# Patient Record
Sex: Male | Born: 1935
Health system: Southern US, Community
[De-identification: ages and names within clinical notes are randomized; demographics above are authoritative.]

## PROBLEM LIST (undated history)

## (undated) DIAGNOSIS — I2699 Other pulmonary embolism without acute cor pulmonale: Secondary | ICD-10-CM

## (undated) DIAGNOSIS — M1990 Unspecified osteoarthritis, unspecified site: Secondary | ICD-10-CM

## (undated) DIAGNOSIS — I509 Heart failure, unspecified: Secondary | ICD-10-CM

## (undated) DIAGNOSIS — M109 Gout, unspecified: Secondary | ICD-10-CM

## (undated) DIAGNOSIS — L97909 Non-pressure chronic ulcer of unspecified part of unspecified lower leg with unspecified severity: Secondary | ICD-10-CM

## (undated) DIAGNOSIS — I2510 Atherosclerotic heart disease of native coronary artery without angina pectoris: Secondary | ICD-10-CM

## (undated) DIAGNOSIS — C4491 Basal cell carcinoma of skin, unspecified: Secondary | ICD-10-CM

## (undated) DIAGNOSIS — K219 Gastro-esophageal reflux disease without esophagitis: Secondary | ICD-10-CM

## (undated) DIAGNOSIS — IMO0002 Reserved for concepts with insufficient information to code with codable children: Secondary | ICD-10-CM

## (undated) DIAGNOSIS — J189 Pneumonia, unspecified organism: Secondary | ICD-10-CM

## (undated) DIAGNOSIS — I499 Cardiac arrhythmia, unspecified: Secondary | ICD-10-CM

## (undated) DIAGNOSIS — I83009 Varicose veins of unspecified lower extremity with ulcer of unspecified site: Secondary | ICD-10-CM

## (undated) DIAGNOSIS — I219 Acute myocardial infarction, unspecified: Secondary | ICD-10-CM

## (undated) DIAGNOSIS — E785 Hyperlipidemia, unspecified: Secondary | ICD-10-CM

## (undated) DIAGNOSIS — K279 Peptic ulcer, site unspecified, unspecified as acute or chronic, without hemorrhage or perforation: Secondary | ICD-10-CM

## (undated) DIAGNOSIS — I491 Atrial premature depolarization: Secondary | ICD-10-CM

## (undated) DIAGNOSIS — I10 Essential (primary) hypertension: Secondary | ICD-10-CM

## (undated) DIAGNOSIS — G629 Polyneuropathy, unspecified: Secondary | ICD-10-CM

## (undated) DIAGNOSIS — C8590 Non-Hodgkin lymphoma, unspecified, unspecified site: Secondary | ICD-10-CM

## (undated) DIAGNOSIS — N529 Male erectile dysfunction, unspecified: Secondary | ICD-10-CM

## (undated) DIAGNOSIS — D839 Common variable immunodeficiency, unspecified: Secondary | ICD-10-CM

## (undated) DIAGNOSIS — R7301 Impaired fasting glucose: Secondary | ICD-10-CM

## (undated) HISTORY — DX: Peptic ulcer, site unspecified, unspecified as acute or chronic, without hemorrhage or perforation: K27.9

## (undated) HISTORY — PX: CORONARY ANGIOPLASTY: SHX604

## (undated) HISTORY — DX: Heart failure, unspecified: I50.9

## (undated) HISTORY — DX: Acute myocardial infarction, unspecified: I21.9

## (undated) HISTORY — PX: BACK SURGERY: SHX140

## (undated) HISTORY — DX: Unspecified osteoarthritis, unspecified site: M19.90

## (undated) HISTORY — DX: Gastro-esophageal reflux disease without esophagitis: K21.9

## (undated) HISTORY — PX: CATARACT EXTRACTION W/ INTRAOCULAR LENS  IMPLANT, BILATERAL: SHX1307

## (undated) HISTORY — DX: Non-Hodgkin lymphoma, unspecified, unspecified site: C85.90

## (undated) HISTORY — DX: Common variable immunodeficiency, unspecified: D83.9

## (undated) HISTORY — DX: Impaired fasting glucose: R73.01

## (undated) HISTORY — DX: Non-pressure chronic ulcer of unspecified part of unspecified lower leg with unspecified severity: L97.909

## (undated) HISTORY — DX: Male erectile dysfunction, unspecified: N52.9

## (undated) HISTORY — DX: Hyperlipidemia, unspecified: E78.5

## (undated) HISTORY — PX: ELBOW SURGERY: SHX618

## (undated) HISTORY — DX: Varicose veins of unspecified lower extremity with ulcer of unspecified site: I83.009

## (undated) HISTORY — DX: Cardiac arrhythmia, unspecified: I49.9

## (undated) HISTORY — DX: Atrial premature depolarization: I49.1

## (undated) HISTORY — DX: Other pulmonary embolism without acute cor pulmonale: I26.99

## (undated) HISTORY — DX: Atherosclerotic heart disease of native coronary artery without angina pectoris: I25.10

## (undated) HISTORY — PX: KNEE ARTHROSCOPY: SUR90

## (undated) HISTORY — DX: Gout, unspecified: M10.9

## (undated) HISTORY — PX: LUMBAR DISC SURGERY: SHX700

## (undated) HISTORY — DX: Essential (primary) hypertension: I10

---

## 1970-11-09 HISTORY — PX: TONSILLECTOMY: SUR1361

## 1978-11-09 HISTORY — PX: NASAL SINUS SURGERY: SHX719

## 1984-11-09 DIAGNOSIS — I219 Acute myocardial infarction, unspecified: Secondary | ICD-10-CM

## 1984-11-09 HISTORY — DX: Acute myocardial infarction, unspecified: I21.9

## 1988-11-09 HISTORY — PX: CARDIAC CATHETERIZATION: SHX172

## 1988-11-09 HISTORY — PX: CORONARY ARTERY BYPASS GRAFT: SHX141

## 1998-02-07 ENCOUNTER — Encounter: Admission: RE | Admit: 1998-02-07 | Discharge: 1998-05-08 | Payer: Self-pay | Admitting: Radiation Oncology

## 1998-04-22 ENCOUNTER — Ambulatory Visit (HOSPITAL_COMMUNITY): Admission: RE | Admit: 1998-04-22 | Discharge: 1998-04-22 | Payer: Self-pay | Admitting: *Deleted

## 1999-03-05 ENCOUNTER — Encounter: Payer: Self-pay | Admitting: *Deleted

## 1999-03-05 ENCOUNTER — Inpatient Hospital Stay (HOSPITAL_COMMUNITY): Admission: AD | Admit: 1999-03-05 | Discharge: 1999-03-07 | Payer: Self-pay | Admitting: *Deleted

## 1999-03-06 ENCOUNTER — Encounter: Payer: Self-pay | Admitting: *Deleted

## 2000-03-08 ENCOUNTER — Ambulatory Visit (HOSPITAL_COMMUNITY): Admission: RE | Admit: 2000-03-08 | Discharge: 2000-03-08 | Payer: Self-pay | Admitting: *Deleted

## 2000-03-09 ENCOUNTER — Ambulatory Visit (HOSPITAL_COMMUNITY): Admission: RE | Admit: 2000-03-09 | Discharge: 2000-03-09 | Payer: Self-pay | Admitting: *Deleted

## 2001-12-03 ENCOUNTER — Encounter: Payer: Self-pay | Admitting: Emergency Medicine

## 2001-12-03 ENCOUNTER — Emergency Department (HOSPITAL_COMMUNITY): Admission: EM | Admit: 2001-12-03 | Discharge: 2001-12-03 | Payer: Self-pay | Admitting: Emergency Medicine

## 2005-02-02 ENCOUNTER — Encounter: Admission: RE | Admit: 2005-02-02 | Discharge: 2005-02-02 | Payer: Self-pay | Admitting: Internal Medicine

## 2005-02-03 ENCOUNTER — Encounter: Admission: RE | Admit: 2005-02-03 | Discharge: 2005-02-03 | Payer: Self-pay | Admitting: Internal Medicine

## 2005-02-17 ENCOUNTER — Encounter (INDEPENDENT_AMBULATORY_CARE_PROVIDER_SITE_OTHER): Payer: Self-pay | Admitting: General Surgery

## 2005-02-17 ENCOUNTER — Encounter (INDEPENDENT_AMBULATORY_CARE_PROVIDER_SITE_OTHER): Payer: Self-pay | Admitting: *Deleted

## 2005-02-17 ENCOUNTER — Ambulatory Visit (HOSPITAL_COMMUNITY): Admission: RE | Admit: 2005-02-17 | Discharge: 2005-02-17 | Payer: Self-pay | Admitting: General Surgery

## 2005-02-26 ENCOUNTER — Ambulatory Visit: Payer: Self-pay | Admitting: Hematology & Oncology

## 2005-03-09 ENCOUNTER — Ambulatory Visit: Payer: Self-pay | Admitting: Hematology & Oncology

## 2005-03-09 ENCOUNTER — Ambulatory Visit (HOSPITAL_COMMUNITY): Admission: RE | Admit: 2005-03-09 | Discharge: 2005-03-09 | Payer: Self-pay | Admitting: Hematology & Oncology

## 2005-03-10 ENCOUNTER — Encounter (INDEPENDENT_AMBULATORY_CARE_PROVIDER_SITE_OTHER): Payer: Self-pay | Admitting: *Deleted

## 2005-03-10 ENCOUNTER — Ambulatory Visit (HOSPITAL_COMMUNITY): Admission: RE | Admit: 2005-03-10 | Discharge: 2005-03-10 | Payer: Self-pay | Admitting: Hematology & Oncology

## 2005-03-11 ENCOUNTER — Ambulatory Visit (HOSPITAL_COMMUNITY): Admission: RE | Admit: 2005-03-11 | Discharge: 2005-03-11 | Payer: Self-pay | Admitting: Hematology & Oncology

## 2005-03-24 ENCOUNTER — Ambulatory Visit (HOSPITAL_COMMUNITY): Admission: RE | Admit: 2005-03-24 | Discharge: 2005-03-24 | Payer: Self-pay | Admitting: Hematology & Oncology

## 2005-04-14 ENCOUNTER — Ambulatory Visit: Payer: Self-pay | Admitting: Hematology & Oncology

## 2005-04-29 ENCOUNTER — Ambulatory Visit (HOSPITAL_COMMUNITY): Admission: RE | Admit: 2005-04-29 | Discharge: 2005-04-29 | Payer: Self-pay | Admitting: Hematology & Oncology

## 2005-05-27 ENCOUNTER — Ambulatory Visit (HOSPITAL_COMMUNITY): Admission: RE | Admit: 2005-05-27 | Discharge: 2005-05-27 | Payer: Self-pay | Admitting: Hematology & Oncology

## 2005-06-02 ENCOUNTER — Ambulatory Visit: Payer: Self-pay | Admitting: Hematology & Oncology

## 2005-06-03 ENCOUNTER — Inpatient Hospital Stay (HOSPITAL_COMMUNITY): Admission: RE | Admit: 2005-06-03 | Discharge: 2005-06-06 | Payer: Self-pay | Admitting: Hematology & Oncology

## 2005-06-04 ENCOUNTER — Ambulatory Visit: Payer: Self-pay | Admitting: Hematology & Oncology

## 2005-06-11 ENCOUNTER — Ambulatory Visit (HOSPITAL_COMMUNITY): Admission: RE | Admit: 2005-06-11 | Discharge: 2005-06-11 | Payer: Self-pay | Admitting: Hematology & Oncology

## 2005-06-29 ENCOUNTER — Ambulatory Visit (HOSPITAL_COMMUNITY): Admission: RE | Admit: 2005-06-29 | Discharge: 2005-06-29 | Payer: Self-pay | Admitting: Hematology & Oncology

## 2005-07-02 ENCOUNTER — Ambulatory Visit (HOSPITAL_COMMUNITY): Admission: RE | Admit: 2005-07-02 | Discharge: 2005-07-02 | Payer: Self-pay | Admitting: Hematology & Oncology

## 2005-08-11 ENCOUNTER — Ambulatory Visit: Payer: Self-pay | Admitting: Hematology & Oncology

## 2005-09-09 ENCOUNTER — Ambulatory Visit (HOSPITAL_COMMUNITY): Admission: RE | Admit: 2005-09-09 | Discharge: 2005-09-09 | Payer: Self-pay | Admitting: Hematology & Oncology

## 2005-11-03 ENCOUNTER — Ambulatory Visit: Payer: Self-pay | Admitting: Hematology & Oncology

## 2005-11-09 DIAGNOSIS — I2699 Other pulmonary embolism without acute cor pulmonale: Secondary | ICD-10-CM

## 2005-11-09 HISTORY — DX: Other pulmonary embolism without acute cor pulmonale: I26.99

## 2005-12-11 ENCOUNTER — Ambulatory Visit (HOSPITAL_COMMUNITY): Admission: RE | Admit: 2005-12-11 | Discharge: 2005-12-11 | Payer: Self-pay | Admitting: Hematology & Oncology

## 2005-12-22 ENCOUNTER — Ambulatory Visit: Payer: Self-pay | Admitting: Hematology & Oncology

## 2006-02-08 ENCOUNTER — Ambulatory Visit: Payer: Self-pay | Admitting: Hematology & Oncology

## 2006-02-08 ENCOUNTER — Ambulatory Visit (HOSPITAL_COMMUNITY): Admission: RE | Admit: 2006-02-08 | Discharge: 2006-02-08 | Payer: Self-pay | Admitting: Hematology & Oncology

## 2006-02-15 LAB — CBC WITH DIFFERENTIAL/PLATELET
BASO%: 0.4 % (ref 0.0–2.0)
EOS%: 4.5 % (ref 0.0–7.0)
HCT: 45 % (ref 38.7–49.9)
LYMPH%: 32 % (ref 14.0–48.0)
MCH: 32.5 pg (ref 28.0–33.4)
MCHC: 34.2 g/dL (ref 32.0–35.9)
MCV: 95.2 fL (ref 81.6–98.0)
MONO%: 8 % (ref 0.0–13.0)
NEUT%: 55.1 % (ref 40.0–75.0)
Platelets: 280 10*3/uL (ref 145–400)

## 2006-02-15 LAB — COMPREHENSIVE METABOLIC PANEL
CO2: 26 mEq/L (ref 19–32)
Calcium: 9.9 mg/dL (ref 8.4–10.5)
Creatinine, Ser: 1.3 mg/dL (ref 0.4–1.5)
Glucose, Bld: 151 mg/dL — ABNORMAL HIGH (ref 70–99)
Total Bilirubin: 0.4 mg/dL (ref 0.3–1.2)

## 2006-03-01 LAB — PROTIME-INR
INR: 2.2 (ref 2.00–3.50)
Protime: 18.2 Seconds (ref 10.6–13.4)

## 2006-03-08 LAB — PROTIME-INR

## 2006-03-08 LAB — PROTHROMBIN TIME: INR: 3.6 — ABNORMAL HIGH (ref 0.0–1.5)

## 2006-03-10 ENCOUNTER — Ambulatory Visit (HOSPITAL_COMMUNITY): Admission: RE | Admit: 2006-03-10 | Discharge: 2006-03-10 | Payer: Self-pay | Admitting: Hematology & Oncology

## 2006-03-15 LAB — PROTHROMBIN TIME: Prothrombin Time: 42.4 seconds — ABNORMAL HIGH (ref 11.6–15.2)

## 2006-03-22 ENCOUNTER — Ambulatory Visit: Payer: Self-pay | Admitting: Hematology & Oncology

## 2006-03-24 LAB — COMPREHENSIVE METABOLIC PANEL
ALT: 26 U/L (ref 0–40)
AST: 30 U/L (ref 0–37)
Alkaline Phosphatase: 58 U/L (ref 39–117)
Calcium: 9.3 mg/dL (ref 8.4–10.5)
Chloride: 104 mEq/L (ref 96–112)
Creatinine, Ser: 1.1 mg/dL (ref 0.4–1.5)
Potassium: 3.7 mEq/L (ref 3.5–5.3)

## 2006-03-24 LAB — CBC WITH DIFFERENTIAL/PLATELET
BASO%: 0.4 % (ref 0.0–2.0)
Basophils Absolute: 0 10*3/uL (ref 0.0–0.1)
EOS%: 3.3 % (ref 0.0–7.0)
HGB: 14.8 g/dL (ref 13.0–17.1)
MCH: 31.5 pg (ref 28.0–33.4)
MCHC: 33.6 g/dL (ref 32.0–35.9)
RBC: 4.71 10*6/uL (ref 4.20–5.71)
RDW: 14.2 % (ref 11.2–14.6)
lymph#: 2.3 10*3/uL (ref 0.9–3.3)

## 2006-03-24 LAB — PROTIME-INR
INR: 3.5 (ref 2.00–3.50)
Protime: 22.4 Seconds — ABNORMAL HIGH (ref 10.6–13.4)

## 2006-04-15 LAB — PROTIME-INR
INR: 2.3 (ref 2.00–3.50)
Protime: 18.4 Seconds — ABNORMAL HIGH (ref 10.6–13.4)

## 2006-04-21 LAB — CBC WITH DIFFERENTIAL/PLATELET
EOS%: 3.3 % (ref 0.0–7.0)
MCH: 33.2 pg (ref 28.0–33.4)
MCV: 95.7 fL (ref 81.6–98.0)
MONO%: 8.2 % (ref 0.0–13.0)
NEUT#: 2.3 10*3/uL (ref 1.5–6.5)
RBC: 4.69 10*6/uL (ref 4.20–5.71)
RDW: 14.4 % (ref 11.2–14.6)

## 2006-04-21 LAB — COMPREHENSIVE METABOLIC PANEL
AST: 27 U/L (ref 0–37)
Albumin: 3.9 g/dL (ref 3.5–5.2)
BUN: 15 mg/dL (ref 6–23)
Calcium: 9.8 mg/dL (ref 8.4–10.5)
Chloride: 103 mEq/L (ref 96–112)
Glucose, Bld: 127 mg/dL — ABNORMAL HIGH (ref 70–99)
Potassium: 3.9 mEq/L (ref 3.5–5.3)

## 2006-04-21 LAB — PROTIME-INR

## 2006-05-03 ENCOUNTER — Ambulatory Visit: Payer: Self-pay | Admitting: Hematology & Oncology

## 2006-06-02 LAB — PROTIME-INR
INR: 1.3 — ABNORMAL LOW (ref 2.00–3.50)
Protime: 14.1 Seconds — ABNORMAL HIGH (ref 10.6–13.4)

## 2006-06-14 ENCOUNTER — Ambulatory Visit (HOSPITAL_COMMUNITY): Admission: RE | Admit: 2006-06-14 | Discharge: 2006-06-14 | Payer: Self-pay | Admitting: Hematology & Oncology

## 2006-06-16 LAB — LACTATE DEHYDROGENASE: LDH: 164 U/L (ref 94–250)

## 2006-06-16 LAB — CBC WITH DIFFERENTIAL/PLATELET
BASO%: 0.6 % (ref 0.0–2.0)
Basophils Absolute: 0 10*3/uL (ref 0.0–0.1)
EOS%: 2.9 % (ref 0.0–7.0)
HCT: 46.3 % (ref 38.7–49.9)
HGB: 16.1 g/dL (ref 13.0–17.1)
LYMPH%: 44.3 % (ref 14.0–48.0)
MCH: 33.3 pg (ref 28.0–33.4)
MCHC: 34.8 g/dL (ref 32.0–35.9)
MCV: 95.7 fL (ref 81.6–98.0)
MONO%: 9.1 % (ref 0.0–13.0)
NEUT%: 43.1 % (ref 40.0–75.0)
lymph#: 2.8 10*3/uL (ref 0.9–3.3)

## 2006-06-16 LAB — PROTIME-INR

## 2006-06-16 LAB — COMPREHENSIVE METABOLIC PANEL
ALT: 21 U/L (ref 0–40)
BUN: 19 mg/dL (ref 6–23)
CO2: 27 mEq/L (ref 19–32)
Calcium: 9.5 mg/dL (ref 8.4–10.5)
Chloride: 103 mEq/L (ref 96–112)
Creatinine, Ser: 1.18 mg/dL (ref 0.40–1.50)
Glucose, Bld: 120 mg/dL — ABNORMAL HIGH (ref 70–99)
Total Bilirubin: 0.7 mg/dL (ref 0.3–1.2)

## 2006-07-05 ENCOUNTER — Ambulatory Visit: Payer: Self-pay | Admitting: Hematology & Oncology

## 2006-07-07 LAB — PROTIME-INR: Protime: 16.8 Seconds — ABNORMAL HIGH (ref 10.6–13.4)

## 2006-07-28 LAB — PROTIME-INR: Protime: 14.4 Seconds — ABNORMAL HIGH (ref 10.6–13.4)

## 2006-08-16 ENCOUNTER — Ambulatory Visit: Payer: Self-pay | Admitting: Hematology & Oncology

## 2006-09-08 LAB — PROTIME-INR

## 2006-09-15 ENCOUNTER — Ambulatory Visit (HOSPITAL_COMMUNITY): Admission: RE | Admit: 2006-09-15 | Discharge: 2006-09-15 | Payer: Self-pay | Admitting: Hematology & Oncology

## 2006-09-29 ENCOUNTER — Ambulatory Visit: Payer: Self-pay | Admitting: Hematology & Oncology

## 2006-09-29 LAB — CBC WITH DIFFERENTIAL/PLATELET
Basophils Absolute: 0 10*3/uL (ref 0.0–0.1)
EOS%: 3.1 % (ref 0.0–7.0)
Eosinophils Absolute: 0.2 10*3/uL (ref 0.0–0.5)
HCT: 45.9 % (ref 38.7–49.9)
HGB: 16.1 g/dL (ref 13.0–17.1)
LYMPH%: 42 % (ref 14.0–48.0)
MCH: 33.7 pg — ABNORMAL HIGH (ref 28.0–33.4)
MCV: 95.9 fL (ref 81.6–98.0)
MONO%: 9.2 % (ref 0.0–13.0)
NEUT#: 2.6 10*3/uL (ref 1.5–6.5)
NEUT%: 45.1 % (ref 40.0–75.0)
Platelets: 313 10*3/uL (ref 145–400)
RDW: 13.5 % (ref 11.2–14.6)

## 2006-09-29 LAB — COMPREHENSIVE METABOLIC PANEL
AST: 20 U/L (ref 0–37)
Albumin: 4.1 g/dL (ref 3.5–5.2)
Alkaline Phosphatase: 68 U/L (ref 39–117)
BUN: 19 mg/dL (ref 6–23)
Creatinine, Ser: 1.22 mg/dL (ref 0.40–1.50)
Glucose, Bld: 134 mg/dL — ABNORMAL HIGH (ref 70–99)
Potassium: 3.8 mEq/L (ref 3.5–5.3)

## 2006-09-29 LAB — PROTIME-INR: INR: 3.2 (ref 2.00–3.50)

## 2006-11-24 ENCOUNTER — Ambulatory Visit: Payer: Self-pay | Admitting: Hematology & Oncology

## 2007-01-06 ENCOUNTER — Ambulatory Visit: Payer: Self-pay | Admitting: Hematology & Oncology

## 2007-01-26 ENCOUNTER — Ambulatory Visit (HOSPITAL_COMMUNITY): Admission: RE | Admit: 2007-01-26 | Discharge: 2007-01-26 | Payer: Self-pay | Admitting: Hematology & Oncology

## 2007-02-09 LAB — CBC WITH DIFFERENTIAL/PLATELET
Eosinophils Absolute: 0.3 10*3/uL (ref 0.0–0.5)
HCT: 44.4 % (ref 38.7–49.9)
LYMPH%: 41.5 % (ref 14.0–48.0)
MONO#: 0.8 10*3/uL (ref 0.1–0.9)
NEUT#: 3 10*3/uL (ref 1.5–6.5)
NEUT%: 41.8 % (ref 40.0–75.0)
Platelets: 228 10*3/uL (ref 145–400)
RBC: 4.62 10*6/uL (ref 4.20–5.71)
WBC: 7.1 10*3/uL (ref 4.0–10.0)
lymph#: 2.9 10*3/uL (ref 0.9–3.3)

## 2007-02-09 LAB — COMPREHENSIVE METABOLIC PANEL
ALT: 22 U/L (ref 0–53)
CO2: 30 mEq/L (ref 19–32)
Calcium: 9.5 mg/dL (ref 8.4–10.5)
Chloride: 105 mEq/L (ref 96–112)
Glucose, Bld: 102 mg/dL — ABNORMAL HIGH (ref 70–99)
Sodium: 142 mEq/L (ref 135–145)
Total Bilirubin: 0.7 mg/dL (ref 0.3–1.2)
Total Protein: 6.6 g/dL (ref 6.0–8.3)

## 2007-02-09 LAB — LACTATE DEHYDROGENASE: LDH: 137 U/L (ref 94–250)

## 2007-02-16 ENCOUNTER — Ambulatory Visit: Payer: Self-pay | Admitting: Hematology & Oncology

## 2007-04-01 ENCOUNTER — Ambulatory Visit: Payer: Self-pay | Admitting: Hematology & Oncology

## 2007-05-16 ENCOUNTER — Ambulatory Visit: Payer: Self-pay | Admitting: Hematology & Oncology

## 2007-05-18 LAB — CBC WITH DIFFERENTIAL/PLATELET
Eosinophils Absolute: 0.2 10*3/uL (ref 0.0–0.5)
MONO#: 0.8 10*3/uL (ref 0.1–0.9)
NEUT#: 2.6 10*3/uL (ref 1.5–6.5)
RBC: 4.49 10*6/uL (ref 4.20–5.71)
RDW: 13.5 % (ref 11.2–14.6)
WBC: 6.9 10*3/uL (ref 4.0–10.0)

## 2007-05-18 LAB — COMPREHENSIVE METABOLIC PANEL
Albumin: 4 g/dL (ref 3.5–5.2)
CO2: 27 mEq/L (ref 19–32)
Chloride: 103 mEq/L (ref 96–112)
Glucose, Bld: 120 mg/dL — ABNORMAL HIGH (ref 70–99)
Potassium: 3.9 mEq/L (ref 3.5–5.3)
Sodium: 140 mEq/L (ref 135–145)
Total Protein: 6.7 g/dL (ref 6.0–8.3)

## 2007-05-18 LAB — LACTATE DEHYDROGENASE: LDH: 136 U/L (ref 94–250)

## 2007-05-20 ENCOUNTER — Ambulatory Visit (HOSPITAL_COMMUNITY): Admission: RE | Admit: 2007-05-20 | Discharge: 2007-05-20 | Payer: Self-pay | Admitting: Hematology & Oncology

## 2007-05-31 ENCOUNTER — Ambulatory Visit (HOSPITAL_BASED_OUTPATIENT_CLINIC_OR_DEPARTMENT_OTHER): Admission: RE | Admit: 2007-05-31 | Discharge: 2007-05-31 | Payer: Self-pay | Admitting: Orthopedic Surgery

## 2007-06-08 LAB — CBC WITH DIFFERENTIAL/PLATELET
BASO%: 1.2 % (ref 0.0–2.0)
LYMPH%: 42.6 % (ref 14.0–48.0)
MCHC: 35.6 g/dL (ref 32.0–35.9)
MONO#: 0.7 10*3/uL (ref 0.1–0.9)
Platelets: 194 10*3/uL (ref 145–400)
RBC: 4.6 10*6/uL (ref 4.20–5.71)
WBC: 6.4 10*3/uL (ref 4.0–10.0)
lymph#: 2.7 10*3/uL (ref 0.9–3.3)

## 2007-06-15 LAB — CBC WITH DIFFERENTIAL/PLATELET
BASO%: 1.1 % (ref 0.0–2.0)
HCT: 44.7 % (ref 38.7–49.9)
MCHC: 35.1 g/dL (ref 32.0–35.9)
MONO#: 0.6 10*3/uL (ref 0.1–0.9)
NEUT%: 44.1 % (ref 40.0–75.0)
RDW: 11.5 % (ref 11.2–14.6)
WBC: 5.8 10*3/uL (ref 4.0–10.0)
lymph#: 2.4 10*3/uL (ref 0.9–3.3)

## 2007-06-22 LAB — CBC WITH DIFFERENTIAL/PLATELET
BASO%: 1 % (ref 0.0–2.0)
EOS%: 3.4 % (ref 0.0–7.0)
Eosinophils Absolute: 0.2 10*3/uL (ref 0.0–0.5)
MCH: 35.1 pg — ABNORMAL HIGH (ref 28.0–33.4)
MCHC: 36.2 g/dL — ABNORMAL HIGH (ref 32.0–35.9)
MCV: 97.1 fL (ref 81.6–98.0)
MONO%: 9.4 % (ref 0.0–13.0)
NEUT#: 3.2 10*3/uL (ref 1.5–6.5)
RBC: 4.41 10*6/uL (ref 4.20–5.71)
RDW: 13.4 % (ref 11.2–14.6)

## 2007-06-29 LAB — CBC WITH DIFFERENTIAL/PLATELET
BASO%: 1.3 % (ref 0.0–2.0)
Eosinophils Absolute: 0.2 10*3/uL (ref 0.0–0.5)
MONO#: 0.7 10*3/uL (ref 0.1–0.9)
NEUT#: 2 10*3/uL (ref 1.5–6.5)
RBC: 4.66 10*6/uL (ref 4.20–5.71)
RDW: 11.5 % (ref 11.2–14.6)
WBC: 5.8 10*3/uL (ref 4.0–10.0)
lymph#: 2.8 10*3/uL (ref 0.9–3.3)

## 2007-06-29 LAB — COMPREHENSIVE METABOLIC PANEL
ALT: 26 U/L (ref 0–53)
Albumin: 3.6 g/dL (ref 3.5–5.2)
CO2: 28 mEq/L (ref 19–32)
Glucose, Bld: 97 mg/dL (ref 70–99)
Potassium: 3.9 mEq/L (ref 3.5–5.3)
Sodium: 140 mEq/L (ref 135–145)
Total Protein: 5.8 g/dL — ABNORMAL LOW (ref 6.0–8.3)

## 2007-06-29 LAB — LACTATE DEHYDROGENASE: LDH: 171 U/L (ref 94–250)

## 2007-07-27 ENCOUNTER — Ambulatory Visit (HOSPITAL_COMMUNITY): Admission: RE | Admit: 2007-07-27 | Discharge: 2007-07-27 | Payer: Self-pay | Admitting: Hematology & Oncology

## 2007-08-09 ENCOUNTER — Ambulatory Visit: Payer: Self-pay | Admitting: Hematology & Oncology

## 2007-08-12 LAB — CBC WITH DIFFERENTIAL/PLATELET
BASO%: 0.7 % (ref 0.0–2.0)
EOS%: 4.5 % (ref 0.0–7.0)
LYMPH%: 41.5 % (ref 14.0–48.0)
MCH: 34.8 pg — ABNORMAL HIGH (ref 28.0–33.4)
MCHC: 35.8 g/dL (ref 32.0–35.9)
MONO#: 0.7 10*3/uL (ref 0.1–0.9)
Platelets: 185 10*3/uL (ref 145–400)
RBC: 4.6 10*6/uL (ref 4.20–5.71)
WBC: 5.5 10*3/uL (ref 4.0–10.0)
lymph#: 2.3 10*3/uL (ref 0.9–3.3)

## 2007-08-12 LAB — COMPREHENSIVE METABOLIC PANEL
ALT: 30 U/L (ref 0–53)
AST: 30 U/L (ref 0–37)
Alkaline Phosphatase: 72 U/L (ref 39–117)
CO2: 28 mEq/L (ref 19–32)
Creatinine, Ser: 1.31 mg/dL (ref 0.40–1.50)
Sodium: 141 mEq/L (ref 135–145)
Total Bilirubin: 0.7 mg/dL (ref 0.3–1.2)
Total Protein: 6.5 g/dL (ref 6.0–8.3)

## 2007-10-11 ENCOUNTER — Ambulatory Visit: Payer: Self-pay | Admitting: Hematology & Oncology

## 2007-10-13 LAB — CBC WITH DIFFERENTIAL/PLATELET
Eosinophils Absolute: 0.1 10*3/uL (ref 0.0–0.5)
HCT: 45.8 % (ref 38.7–49.9)
LYMPH%: 48.1 % — ABNORMAL HIGH (ref 14.0–48.0)
MONO#: 0.5 10*3/uL (ref 0.1–0.9)
NEUT#: 1.8 10*3/uL (ref 1.5–6.5)
NEUT%: 36.8 % — ABNORMAL LOW (ref 40.0–75.0)
Platelets: 189 10*3/uL (ref 145–400)
WBC: 4.9 10*3/uL (ref 4.0–10.0)

## 2007-10-13 LAB — COMPREHENSIVE METABOLIC PANEL
BUN: 17 mg/dL (ref 6–23)
CO2: 24 mEq/L (ref 19–32)
Creatinine, Ser: 1.13 mg/dL (ref 0.40–1.50)
Glucose, Bld: 143 mg/dL — ABNORMAL HIGH (ref 70–99)
Total Bilirubin: 0.7 mg/dL (ref 0.3–1.2)
Total Protein: 6.6 g/dL (ref 6.0–8.3)

## 2007-10-13 LAB — LACTATE DEHYDROGENASE: LDH: 166 U/L (ref 94–250)

## 2007-11-23 ENCOUNTER — Ambulatory Visit (HOSPITAL_COMMUNITY): Admission: RE | Admit: 2007-11-23 | Discharge: 2007-11-23 | Payer: Self-pay | Admitting: Hematology & Oncology

## 2007-12-05 ENCOUNTER — Ambulatory Visit: Payer: Self-pay | Admitting: Hematology & Oncology

## 2007-12-07 LAB — COMPREHENSIVE METABOLIC PANEL
ALT: 32 U/L (ref 0–53)
AST: 30 U/L (ref 0–37)
Albumin: 4.2 g/dL (ref 3.5–5.2)
BUN: 14 mg/dL (ref 6–23)
Calcium: 9.7 mg/dL (ref 8.4–10.5)
Chloride: 101 mEq/L (ref 96–112)
Potassium: 3.8 mEq/L (ref 3.5–5.3)
Sodium: 140 mEq/L (ref 135–145)
Total Protein: 6.8 g/dL (ref 6.0–8.3)

## 2007-12-07 LAB — CBC WITH DIFFERENTIAL/PLATELET
Basophils Absolute: 0 10*3/uL (ref 0.0–0.1)
EOS%: 4.6 % (ref 0.0–7.0)
Eosinophils Absolute: 0.2 10*3/uL (ref 0.0–0.5)
HCT: 46.8 % (ref 38.7–49.9)
HGB: 16.3 g/dL (ref 13.0–17.1)
LYMPH%: 50.9 % — ABNORMAL HIGH (ref 14.0–48.0)
MCHC: 34.8 g/dL (ref 32.0–35.9)
MONO%: 11.5 % (ref 0.0–13.0)
NEUT#: 1.7 10*3/uL (ref 1.5–6.5)
RBC: 4.83 10*6/uL (ref 4.20–5.71)
RDW: 13 % (ref 11.2–14.6)
WBC: 5.1 10*3/uL (ref 4.0–10.0)

## 2008-01-24 ENCOUNTER — Ambulatory Visit: Payer: Self-pay | Admitting: Hematology & Oncology

## 2008-01-26 LAB — CBC WITH DIFFERENTIAL/PLATELET
BASO%: 0.8 % (ref 0.0–2.0)
Eosinophils Absolute: 0.2 10*3/uL (ref 0.0–0.5)
HCT: 45.7 % (ref 38.7–49.9)
LYMPH%: 52.6 % — ABNORMAL HIGH (ref 14.0–48.0)
MCHC: 35.4 g/dL (ref 32.0–35.9)
MCV: 95.2 fL (ref 81.6–98.0)
MONO#: 0.7 10*3/uL (ref 0.1–0.9)
MONO%: 12.4 % (ref 0.0–13.0)
NEUT%: 31 % — ABNORMAL LOW (ref 40.0–75.0)
Platelets: 195 10*3/uL (ref 145–400)
RBC: 4.8 10*6/uL (ref 4.20–5.71)
WBC: 5.5 10*3/uL (ref 4.0–10.0)

## 2008-01-26 LAB — COMPREHENSIVE METABOLIC PANEL
AST: 29 U/L (ref 0–37)
Albumin: 4 g/dL (ref 3.5–5.2)
Alkaline Phosphatase: 65 U/L (ref 39–117)
BUN: 15 mg/dL (ref 6–23)
Calcium: 9.7 mg/dL (ref 8.4–10.5)
Chloride: 103 mEq/L (ref 96–112)
Potassium: 3.6 mEq/L (ref 3.5–5.3)
Sodium: 140 mEq/L (ref 135–145)
Total Protein: 6.7 g/dL (ref 6.0–8.3)

## 2008-03-12 ENCOUNTER — Ambulatory Visit: Payer: Self-pay | Admitting: Hematology & Oncology

## 2008-04-30 ENCOUNTER — Ambulatory Visit: Payer: Self-pay | Admitting: Hematology & Oncology

## 2008-04-30 LAB — CBC WITH DIFFERENTIAL/PLATELET
BASO%: 1.1 % (ref 0.0–2.0)
EOS%: 3.9 % (ref 0.0–7.0)
LYMPH%: 43.3 % (ref 14.0–48.0)
MCH: 34.4 pg — ABNORMAL HIGH (ref 28.0–33.4)
MCHC: 35.5 g/dL (ref 32.0–35.9)
MCV: 96.9 fL (ref 81.6–98.0)
MONO%: 9.8 % (ref 0.0–13.0)
NEUT#: 3.2 10*3/uL (ref 1.5–6.5)
Platelets: 193 10*3/uL (ref 145–400)
RBC: 4.96 10*6/uL (ref 4.20–5.71)
RDW: 12.5 % (ref 11.2–14.6)

## 2008-04-30 LAB — COMPREHENSIVE METABOLIC PANEL
AST: 24 U/L (ref 0–37)
Albumin: 3.8 g/dL (ref 3.5–5.2)
Alkaline Phosphatase: 58 U/L (ref 39–117)
Glucose, Bld: 93 mg/dL (ref 70–99)
Potassium: 3.6 mEq/L (ref 3.5–5.3)
Sodium: 140 mEq/L (ref 135–145)
Total Bilirubin: 0.6 mg/dL (ref 0.3–1.2)
Total Protein: 6.2 g/dL (ref 6.0–8.3)

## 2008-05-14 ENCOUNTER — Ambulatory Visit (HOSPITAL_COMMUNITY): Admission: RE | Admit: 2008-05-14 | Discharge: 2008-05-14 | Payer: Self-pay | Admitting: Hematology & Oncology

## 2008-05-28 ENCOUNTER — Ambulatory Visit: Payer: Self-pay | Admitting: Surgery

## 2008-05-28 ENCOUNTER — Ambulatory Visit: Admission: RE | Admit: 2008-05-28 | Discharge: 2008-05-28 | Payer: Self-pay | Admitting: Internal Medicine

## 2008-05-28 ENCOUNTER — Encounter (INDEPENDENT_AMBULATORY_CARE_PROVIDER_SITE_OTHER): Payer: Self-pay | Admitting: Internal Medicine

## 2008-07-27 ENCOUNTER — Ambulatory Visit: Payer: Self-pay | Admitting: Hematology & Oncology

## 2008-07-30 LAB — COMPREHENSIVE METABOLIC PANEL
AST: 22 U/L (ref 0–37)
Albumin: 3.8 g/dL (ref 3.5–5.2)
Alkaline Phosphatase: 62 U/L (ref 39–117)
BUN: 17 mg/dL (ref 6–23)
Calcium: 9.5 mg/dL (ref 8.4–10.5)
Chloride: 103 mEq/L (ref 96–112)
Potassium: 3.3 mEq/L — ABNORMAL LOW (ref 3.5–5.3)
Sodium: 142 mEq/L (ref 135–145)
Total Protein: 6.3 g/dL (ref 6.0–8.3)

## 2008-07-30 LAB — CBC WITH DIFFERENTIAL (CANCER CENTER ONLY)
BASO%: 0.9 % (ref 0.0–2.0)
EOS%: 3 % (ref 0.0–7.0)
LYMPH#: 2.6 10*3/uL (ref 0.9–3.3)
MCH: 33.3 pg (ref 28.0–33.4)
MCHC: 34.2 g/dL (ref 32.0–35.9)
MONO%: 9.3 % (ref 0.0–13.0)
NEUT#: 3.4 10*3/uL (ref 1.5–6.5)
Platelets: 166 10*3/uL (ref 145–400)

## 2008-09-10 ENCOUNTER — Ambulatory Visit: Payer: Self-pay | Admitting: Hematology & Oncology

## 2008-10-19 ENCOUNTER — Ambulatory Visit: Payer: Self-pay | Admitting: Hematology & Oncology

## 2008-10-22 LAB — CBC WITH DIFFERENTIAL (CANCER CENTER ONLY)
BASO#: 0.1 10*3/uL (ref 0.0–0.2)
BASO%: 0.8 % (ref 0.0–2.0)
Eosinophils Absolute: 0.2 10*3/uL (ref 0.0–0.5)
HCT: 44.8 % (ref 38.7–49.9)
HGB: 15.2 g/dL (ref 13.0–17.1)
LYMPH#: 1.7 10*3/uL (ref 0.9–3.3)
LYMPH%: 28.9 % (ref 14.0–48.0)
MCV: 100 fL — ABNORMAL HIGH (ref 82–98)
MONO#: 0.8 10*3/uL (ref 0.1–0.9)
NEUT%: 53.4 % (ref 40.0–80.0)
RDW: 11.3 % (ref 10.5–14.6)
WBC: 5.9 10*3/uL (ref 4.0–10.0)

## 2008-10-22 LAB — LACTATE DEHYDROGENASE: LDH: 257 U/L — ABNORMAL HIGH (ref 94–250)

## 2008-10-22 LAB — COMPREHENSIVE METABOLIC PANEL
ALT: 24 U/L (ref 0–53)
AST: 20 U/L (ref 0–37)
Albumin: 4.1 g/dL (ref 3.5–5.2)
BUN: 18 mg/dL (ref 6–23)
CO2: 29 mEq/L (ref 19–32)
Calcium: 9.9 mg/dL (ref 8.4–10.5)
Chloride: 102 mEq/L (ref 96–112)
Potassium: 4 mEq/L (ref 3.5–5.3)

## 2008-12-04 ENCOUNTER — Ambulatory Visit (HOSPITAL_COMMUNITY): Admission: RE | Admit: 2008-12-04 | Discharge: 2008-12-04 | Payer: Self-pay | Admitting: Hematology & Oncology

## 2008-12-07 ENCOUNTER — Ambulatory Visit: Payer: Self-pay | Admitting: Hematology & Oncology

## 2008-12-21 LAB — CBC WITH DIFFERENTIAL (CANCER CENTER ONLY)
BASO%: 1.4 % (ref 0.0–2.0)
Eosinophils Absolute: 0.2 10*3/uL (ref 0.0–0.5)
HCT: 46.2 % (ref 38.7–49.9)
LYMPH#: 1.9 10*3/uL (ref 0.9–3.3)
LYMPH%: 36.1 % (ref 14.0–48.0)
MCV: 100 fL — ABNORMAL HIGH (ref 82–98)
MONO#: 0.9 10*3/uL (ref 0.1–0.9)
NEUT%: 40.8 % (ref 40.0–80.0)
Platelets: 163 10*3/uL (ref 145–400)
RBC: 4.62 10*6/uL (ref 4.20–5.70)
RDW: 11.4 % (ref 10.5–14.6)
WBC: 5.1 10*3/uL (ref 4.0–10.0)

## 2008-12-21 LAB — COMPREHENSIVE METABOLIC PANEL
AST: 27 U/L (ref 0–37)
BUN: 18 mg/dL (ref 6–23)
Calcium: 9.6 mg/dL (ref 8.4–10.5)
Chloride: 104 mEq/L (ref 96–112)
Creatinine, Ser: 1.15 mg/dL (ref 0.40–1.50)
Total Bilirubin: 0.5 mg/dL (ref 0.3–1.2)

## 2009-02-05 ENCOUNTER — Ambulatory Visit: Payer: Self-pay | Admitting: Hematology & Oncology

## 2009-03-20 ENCOUNTER — Ambulatory Visit (HOSPITAL_COMMUNITY): Admission: RE | Admit: 2009-03-20 | Discharge: 2009-03-20 | Payer: Self-pay | Admitting: Hematology & Oncology

## 2009-04-01 ENCOUNTER — Ambulatory Visit: Payer: Self-pay | Admitting: Hematology & Oncology

## 2009-04-02 LAB — LACTATE DEHYDROGENASE: LDH: 239 U/L (ref 94–250)

## 2009-04-02 LAB — COMPREHENSIVE METABOLIC PANEL
ALT: 26 U/L (ref 0–53)
AST: 29 U/L (ref 0–37)
Albumin: 4 g/dL (ref 3.5–5.2)
CO2: 28 mEq/L (ref 19–32)
Calcium: 9.6 mg/dL (ref 8.4–10.5)
Chloride: 100 mEq/L (ref 96–112)
Potassium: 3.7 mEq/L (ref 3.5–5.3)
Sodium: 139 mEq/L (ref 135–145)
Total Protein: 6.8 g/dL (ref 6.0–8.3)

## 2009-04-02 LAB — CBC WITH DIFFERENTIAL (CANCER CENTER ONLY)
BASO%: 0.8 % (ref 0.0–2.0)
Eosinophils Absolute: 0.2 10*3/uL (ref 0.0–0.5)
MONO#: 0.7 10*3/uL (ref 0.1–0.9)
NEUT#: 2.7 10*3/uL (ref 1.5–6.5)
Platelets: 196 10*3/uL (ref 145–400)
RBC: 4.47 10*6/uL (ref 4.20–5.70)
RDW: 11.2 % (ref 10.5–14.6)
WBC: 6.2 10*3/uL (ref 4.0–10.0)

## 2009-05-10 ENCOUNTER — Ambulatory Visit: Payer: Self-pay | Admitting: Hematology & Oncology

## 2009-06-24 ENCOUNTER — Ambulatory Visit: Payer: Self-pay | Admitting: Hematology & Oncology

## 2009-08-05 ENCOUNTER — Ambulatory Visit: Payer: Self-pay | Admitting: Hematology

## 2009-09-02 ENCOUNTER — Ambulatory Visit (HOSPITAL_COMMUNITY): Admission: RE | Admit: 2009-09-02 | Discharge: 2009-09-02 | Payer: Self-pay | Admitting: Hematology & Oncology

## 2009-09-04 ENCOUNTER — Emergency Department (HOSPITAL_COMMUNITY): Admission: EM | Admit: 2009-09-04 | Discharge: 2009-09-04 | Payer: Self-pay | Admitting: Emergency Medicine

## 2009-09-18 ENCOUNTER — Ambulatory Visit: Payer: Self-pay | Admitting: Hematology & Oncology

## 2009-09-19 LAB — CBC WITH DIFFERENTIAL (CANCER CENTER ONLY)
BASO%: 1.8 % (ref 0.0–2.0)
EOS%: 3 % (ref 0.0–7.0)
LYMPH%: 21.5 % (ref 14.0–48.0)
MCH: 33.4 pg (ref 28.0–33.4)
MCHC: 34.6 g/dL (ref 32.0–35.9)
MONO%: 9.8 % (ref 0.0–13.0)
NEUT#: 6 10*3/uL (ref 1.5–6.5)
Platelets: 252 10*3/uL (ref 145–400)
RBC: 4.82 10*6/uL (ref 4.20–5.70)
RDW: 11.3 % (ref 10.5–14.6)

## 2009-09-20 LAB — COMPREHENSIVE METABOLIC PANEL
Alkaline Phosphatase: 85 U/L (ref 39–117)
CO2: 30 mEq/L (ref 19–32)
Creatinine, Ser: 1.32 mg/dL (ref 0.40–1.50)
Glucose, Bld: 122 mg/dL — ABNORMAL HIGH (ref 70–99)
Sodium: 139 mEq/L (ref 135–145)
Total Bilirubin: 0.5 mg/dL (ref 0.3–1.2)

## 2009-09-20 LAB — LACTATE DEHYDROGENASE: LDH: 204 U/L (ref 94–250)

## 2009-09-20 LAB — VITAMIN D 25 HYDROXY (VIT D DEFICIENCY, FRACTURES): Vit D, 25-Hydroxy: 22 ng/mL — ABNORMAL LOW (ref 30–89)

## 2009-11-07 ENCOUNTER — Ambulatory Visit: Payer: Self-pay | Admitting: Hematology

## 2010-01-03 ENCOUNTER — Ambulatory Visit: Payer: Self-pay | Admitting: Hematology

## 2010-02-17 ENCOUNTER — Encounter (HOSPITAL_BASED_OUTPATIENT_CLINIC_OR_DEPARTMENT_OTHER): Admission: RE | Admit: 2010-02-17 | Discharge: 2010-05-18 | Payer: Self-pay | Admitting: General Surgery

## 2010-02-25 ENCOUNTER — Ambulatory Visit: Payer: Self-pay | Admitting: Hematology & Oncology

## 2010-02-26 ENCOUNTER — Ambulatory Visit: Payer: Self-pay | Admitting: Vascular Surgery

## 2010-02-28 LAB — COMPREHENSIVE METABOLIC PANEL
ALT: 20 U/L (ref 0–53)
AST: 21 U/L (ref 0–37)
Albumin: 3.8 g/dL (ref 3.5–5.2)
Alkaline Phosphatase: 74 U/L (ref 39–117)
BUN: 28 mg/dL — ABNORMAL HIGH (ref 6–23)
CO2: 30 mEq/L (ref 19–32)
Calcium: 9.4 mg/dL (ref 8.4–10.5)
Chloride: 98 mEq/L (ref 96–112)
Creatinine, Ser: 1.2 mg/dL (ref 0.40–1.50)
Glucose, Bld: 108 mg/dL — ABNORMAL HIGH (ref 70–99)
Total Bilirubin: 0.5 mg/dL (ref 0.3–1.2)

## 2010-02-28 LAB — VITAMIN D 25 HYDROXY (VIT D DEFICIENCY, FRACTURES): Vit D, 25-Hydroxy: 33 ng/mL (ref 30–89)

## 2010-02-28 LAB — CBC WITH DIFFERENTIAL (CANCER CENTER ONLY)
BASO#: 0.1 10*3/uL (ref 0.0–0.2)
BASO%: 0.8 % (ref 0.0–2.0)
EOS%: 1.3 % (ref 0.0–7.0)
Eosinophils Absolute: 0.2 10*3/uL (ref 0.0–0.5)
HGB: 14.7 g/dL (ref 13.0–17.1)
LYMPH%: 16.6 % (ref 14.0–48.0)
MCV: 99 fL — ABNORMAL HIGH (ref 82–98)
MONO#: 0.9 10*3/uL (ref 0.1–0.9)
MONO%: 7.6 % (ref 0.0–13.0)
Platelets: 188 10*3/uL (ref 145–400)

## 2010-04-11 ENCOUNTER — Ambulatory Visit (HOSPITAL_COMMUNITY): Admission: RE | Admit: 2010-04-11 | Discharge: 2010-04-11 | Payer: Self-pay | Admitting: Hematology & Oncology

## 2010-08-09 HISTORY — PX: INSERT / REPLACE / REMOVE PACEMAKER: SUR710

## 2010-08-13 ENCOUNTER — Ambulatory Visit: Payer: Self-pay | Admitting: Hematology & Oncology

## 2010-08-15 LAB — CBC WITH DIFFERENTIAL (CANCER CENTER ONLY)
BASO#: 0.1 10*3/uL (ref 0.0–0.2)
BASO%: 0.7 % (ref 0.0–2.0)
MCV: 97 fL (ref 82–98)
MONO#: 0.8 10*3/uL (ref 0.1–0.9)
NEUT#: 4.6 10*3/uL (ref 1.5–6.5)
Platelets: 236 10*3/uL (ref 145–400)
RBC: 4.57 10*6/uL (ref 4.20–5.70)

## 2010-08-15 LAB — LACTATE DEHYDROGENASE: LDH: 200 U/L (ref 94–250)

## 2010-08-15 LAB — COMPREHENSIVE METABOLIC PANEL
ALT: 19 U/L (ref 0–53)
AST: 26 U/L (ref 0–37)
Alkaline Phosphatase: 65 U/L (ref 39–117)
CO2: 28 mEq/L (ref 19–32)
Calcium: 9.6 mg/dL (ref 8.4–10.5)
Potassium: 2.9 mEq/L — ABNORMAL LOW (ref 3.5–5.3)
Sodium: 139 mEq/L (ref 135–145)

## 2010-08-31 ENCOUNTER — Ambulatory Visit: Payer: Self-pay | Admitting: Internal Medicine

## 2010-08-31 ENCOUNTER — Inpatient Hospital Stay (HOSPITAL_COMMUNITY): Admission: EM | Admit: 2010-08-31 | Discharge: 2010-09-03 | Payer: Self-pay | Admitting: Emergency Medicine

## 2010-09-01 ENCOUNTER — Encounter (INDEPENDENT_AMBULATORY_CARE_PROVIDER_SITE_OTHER): Payer: Self-pay | Admitting: Cardiology

## 2010-09-02 ENCOUNTER — Encounter: Payer: Self-pay | Admitting: Internal Medicine

## 2010-09-04 ENCOUNTER — Encounter: Payer: Self-pay | Admitting: Internal Medicine

## 2010-09-09 ENCOUNTER — Emergency Department (HOSPITAL_COMMUNITY): Admission: EM | Admit: 2010-09-09 | Discharge: 2010-09-09 | Payer: Self-pay | Admitting: Emergency Medicine

## 2010-09-18 ENCOUNTER — Ambulatory Visit: Payer: Self-pay

## 2010-09-18 ENCOUNTER — Encounter: Payer: Self-pay | Admitting: Internal Medicine

## 2010-11-30 ENCOUNTER — Encounter: Payer: Self-pay | Admitting: Hematology & Oncology

## 2010-12-02 ENCOUNTER — Encounter: Payer: Self-pay | Admitting: Internal Medicine

## 2010-12-02 ENCOUNTER — Ambulatory Visit
Admission: RE | Admit: 2010-12-02 | Discharge: 2010-12-02 | Payer: Self-pay | Source: Home / Self Care | Attending: Internal Medicine | Admitting: Internal Medicine

## 2010-12-02 DIAGNOSIS — I482 Chronic atrial fibrillation, unspecified: Secondary | ICD-10-CM | POA: Insufficient documentation

## 2010-12-02 DIAGNOSIS — Z950 Presence of cardiac pacemaker: Secondary | ICD-10-CM | POA: Insufficient documentation

## 2010-12-02 DIAGNOSIS — I4891 Unspecified atrial fibrillation: Secondary | ICD-10-CM | POA: Insufficient documentation

## 2010-12-02 DIAGNOSIS — I5032 Chronic diastolic (congestive) heart failure: Secondary | ICD-10-CM | POA: Insufficient documentation

## 2010-12-08 ENCOUNTER — Other Ambulatory Visit: Payer: Self-pay | Admitting: Dermatology

## 2010-12-11 NOTE — Assessment & Plan Note (Signed)
Summary: pc2/st jude/jml   Visit Type:  Follow-up   History of Present Illness: Mr. Frimpong returns today for pacemaker followup.  He is a pleasant 77 yo man with a h/o symptomatic bradycardia, s/p PPM.  He also has HTN and diastolic heart failure, right more then left with chronic peripheral edema.  No c/p, sob or syncope.  He has had some difficulty sleeping at night as his wife has been sick and is in the nursing facility.  Current Medications (verified): 1)  Meclizine Hcl 12.5 Mg Tabs (Meclizine Hcl) .... As Needed 2)  Warfarin Sodium 2 Mg Tabs (Warfarin Sodium) .... As Directed 3)  Aspir-Low 81 Mg Tbec (Aspirin) .... One By Mouth Daily 4)  Atenolol 50 Mg Tabs (Atenolol) .... One By Mouth Daily 5)  Colcrys 0.6 Mg Tabs (Colchicine) .... One By Mouth Daily 6)  Furosemide 80 Mg Tabs (Furosemide) .... One By Mouth Two Times A Day 7)  Prednisone 5 Mg Tabs (Prednisone) .... Once Daily 8)  Allopurinol 100 Mg Tabs (Allopurinol) .... Once Daily 9)  Potassium Chloride Crys Cr 20 Meq Cr-Tabs (Potassium Chloride Crys Cr) .... Take One Tablet By Mouth Daily 10)  Gabapentin 100 Mg Caps (Gabapentin) .... Three Times A Day 11)  Tramadol Hcl 50 Mg Tabs (Tramadol Hcl) .... Once Daily 12)  Xanax 0.5 Mg Tabs (Alprazolam) .... Uad 13)  Vitamin D 1000 Unit Tabs (Cholecalciferol) .... Uad 14)  Metolazone 2.5 Mg Tabs (Metolazone) .... Take One Tablet By Mouth Daily. 15)  Nexium 40 Mg Cpdr (Esomeprazole Magnesium) .... Once Daily 16)  Tylenol 325 Mg Tabs (Acetaminophen) .... Uad 17)  Nitrostat 0.4 Mg Subl (Nitroglycerin) .Marland Kitchen.. 1 Tablet Under Tongue At Onset of Chest Pain; You May Repeat Every 5 Minutes For Up To 3 Doses.  Allergies: 1)  ! * Indomethacin 2)  ! Amlodipine Besylate 3)  ! * Atorvastatin 4)  ! Codeine 5)  ! * Azithromycin 6)  ! Lorazepam 7)  ! Sulfa 8)  ! * Digoxin  Past History:  Past Medical History: HTN Diastolic CHF atrial fib  Review of Systems  The patient denies chest  pain, syncope, dyspnea on exertion, and peripheral edema.    Vital Signs:  Patient profile:   77 year old male Height:      64 inches Weight:      211 pounds BMI:     36.35 Pulse rate:   65 / minute BP sitting:   142 / 84  (left arm)  Vitals Entered By: Laurance Flatten CMA (December 02, 2010 12:16 PM)  Physical Exam  General:  Elderly, well developed, well nourished, in no acute distress. Looks younger than his stated age. HEENT: normal Neck: supple. No JVD. Carotids 2+ bilaterally no bruits Cor: RRR no rubs, gallops or murmur Lungs: CTA. Well healed PPM incision. Ab: soft, nontender. nondistended. No HSM. Good bowel sounds Ext: warm. no cyanosis or clubbing Neuro: alert and oriented. Grossly nonfocal. affect pleasant    PPM Specifications Following MD:  Lewayne Bunting, MD     PPM Vendor:  St Jude     PPM Model Number:  [id number]     PPM Serial Number:  [id number] PPM DOI:  09/02/2010     PPM Implanting MD:  Lewayne Bunting, MD  Lead 1    Location: RA     DOI: 09/02/2010     Model #: [id number]     Serial #: [id number]     Status: active Lead 2    Location:  RV     DOI: 09/02/2010     Model #: [id number]     Serial #: [id number]     Status: active  Magnet Response Rate:  BOL 100 ERI 85  Indications:  Sick sinus syndrome   PPM Follow Up Battery Voltage:  2.98 V     Battery Est. Longevity:  9.9 yrs     Pacer Dependent:  No       PPM Device Measurements Atrium  Amplitude: 1.9 mV, Impedance: 430 ohms, Threshold: 0.75 V at 0.4 msec Right Ventricle  Amplitude: 12.0 mV, Impedance: 700 ohms, Threshold: 0.5 V at 0.4 msec  Episodes MS Episodes:  1452     Percent Mode Switch:  17%     Coumadin:  Yes Ventricular High Rate:  0     Atrial Pacing:  43%     Ventricular Pacing:  9.8%  Parameters Mode:  DDD     Lower Rate Limit:  60     Upper Rate Limit:  120 Paced AV Delay:  200     Sensed AV Delay:  200 Next Cardiology Appt Due:  08/10/2011 Tech Comments:  1452 AMS EPISODES--LONGEST WAS 6 HRS 51 MINUTES.   NORMAL DEVICE FUNCTION.  NO CHANGES MADE. ROV IN OCT W/GT.  PT HAS MERLIN TRANSMITTER AND WILL START USING AFTER OCT. Vella Kohler  December 02, 2010 11:50 AM MD Comments:  Agree with above.  Impression & Recommendations:  Problem # 1:  CARDIAC PACEMAKER IN SITU (ICD-V45.01) His device is working normally.  Will recheck in several months.  Problem # 2:  CHRONIC DIASTOLIC HEART FAILURE (ICD-428.32) His symptoms are class 2.  I have asked him to maintain a low sodium diet. He will continue his current meds. His updated medication list for this problem includes:    Warfarin Sodium 2 Mg Tabs (Warfarin sodium) .Marland Kitchen... As directed    Aspir-low 81 Mg Tbec (Aspirin) ..... One by mouth daily    Atenolol 50 Mg Tabs (Atenolol) ..... One by mouth daily    Furosemide 80 Mg Tabs (Furosemide) ..... One by mouth two times a day    Metolazone 2.5 Mg Tabs (Metolazone) .Marland Kitchen... Take one tablet by mouth daily.    Nitrostat 0.4 Mg Subl (Nitroglycerin) .Marland Kitchen... 1 tablet under tongue at onset of chest pain; you may repeat every 5 minutes for up to 3 doses.  Problem # 3:  ATRIAL FIBRILLATION (ICD-427.31) His symptoms are well controlled. He is out of rhythm about 17% of the time. His updated medication list for this problem includes:    Warfarin Sodium 2 Mg Tabs (Warfarin sodium) .Marland Kitchen... As directed    Aspir-low 81 Mg Tbec (Aspirin) ..... One by mouth daily    Atenolol 50 Mg Tabs (Atenolol) ..... One by mouth daily

## 2010-12-11 NOTE — Procedures (Signed)
Summary: wound check/st jude   Current Medications (verified): 1)  Meclizine Hcl 12.5 Mg Tabs (Meclizine Hcl) .... As Needed 2)  Warfarin Sodium 2 Mg Tabs (Warfarin Sodium) .... As Directed 3)  Aspir-Low 81 Mg Tbec (Aspirin) .... One By Mouth Daily 4)  Atenolol 50 Mg Tabs (Atenolol) .... One By Mouth Daily 5)  Colcrys 0.6 Mg Tabs (Colchicine) .... One By Mouth Daily 6)  Furosemide 80 Mg Tabs (Furosemide) .... One By Mouth Two Times A Day  Allergies (verified): 1)  ! * Indomethacin 2)  ! Amlodipine Besylate 3)  ! * Atorvastatin 4)  ! Codeine 5)  ! * Azithromycin 6)  ! Lorazepam 7)  ! Sulfa 8)  ! * Digoxin  PPM Specifications Following MD:  Lewayne Bunting, MD     PPM Vendor:  St Jude     PPM Model Number:  [id number]     PPM Serial Number:  [id number] PPM DOI:  09/02/2010     PPM Implanting MD:  Lewayne Bunting, MD  Lead 1    Location: RA     DOI: 09/02/2010     Model #: [id number]     Serial #: [id number]     Status: active Lead 2    Location: RV     DOI: 09/02/2010     Model #: [id number]     Serial #: [id number]     Status: active  Magnet Response Rate:  BOL 100 ERI 85  Indications:  Sick sinus syndrome   PPM Follow Up Remote Check?  No Battery Voltage:  3.01 V     Battery Est. Longevity:  11.3 years     Pacer Dependent:  No       PPM Device Measurements Atrium  Amplitude: 2.5 mV, Impedance: 450 ohms, Threshold: 0.75 V at 0.4 msec Right Ventricle  Amplitude: 12 mV, Impedance: 690 ohms, Threshold: 0.5 V at 0.4 msec  Episodes MS Episodes:  112     Percent Mode Switch:  14%     Coumadin:  Yes Atrial Pacing:  52%     Ventricular Pacing:  8.6%  Parameters Mode:  DDD     Lower Rate Limit:  60     Upper Rate Limit:  120 Paced AV Delay:  200     Sensed AV Delay:  200 Next Remote Date:  12/02/2010     Tech Comments:  Steri strips removed, no redness or edema.  Atrial and ventricular autocapture on today.  The longest mode switch was 6 hours, + coumadin.  ROV 12/02/10 with Dr. Ladona Ridgel. Altha Harm,  LPN  September 18, 2010 12:24 PM

## 2010-12-11 NOTE — Miscellaneous (Signed)
Summary: Device preload  Clinical Lists Changes  Observations: Added new observation of PPM INDICATN: Sick sinus syndrome (09/04/2010 13:19) Added new observation of MAGNET RTE: BOL 100 ERI 85 (09/04/2010 13:19) Added new observation of PPMLEADSTAT2: active (09/04/2010 13:19) Added new observation of PPMLEADSER2: [id number] (09/04/2010 13:19) Added new observation of PPMLEADMOD2: [id number] (09/04/2010 13:19) Added new observation of PPMLEADLOC2: RV (09/04/2010 13:19) Added new observation of PPMLEADSTAT1: active (09/04/2010 13:19) Added new observation of PPMLEADSER1: [id number] (09/04/2010 13:19) Added new observation of PPMLEADMOD1: 2130QM (09/04/2010 13:19) Added new observation of PPMLEADLOC1: RA (09/04/2010 13:19) Added new observation of PPM IMP MD: Lewayne Bunting, MD (09/04/2010 13:19) Added new observation of PPMLEADDOI2: 09/02/2010 (09/04/2010 13:19) Added new observation of PPMLEADDOI1: 09/02/2010 (09/04/2010 13:19) Added new observation of PPM DOI: 09/02/2010 (09/04/2010 13:19) Added new observation of PPM SERL#: [id number]  (09/04/2010 13:19) Added new observation of PPM MODL#: [id number]  (09/04/2010 13:24) Added new observation of PACEMAKERMFG: St Jude  (09/04/2010 13:19) Added new observation of PACEMAKER MD: Lewayne Bunting, MD  (09/04/2010 13:19)      PPM Specifications Following MD:  Lewayne Bunting, MD     PPM Vendor:  St Jude     PPM Model Number:  [id number]     PPM Serial Number:  [id number] PPM DOI:  09/02/2010     PPM Implanting MD:  Lewayne Bunting, MD  Lead 1    Location: RA     DOI: 09/02/2010     Model #: [id number]     Serial #: [id number]     Status: active Lead 2    Location: RV     DOI: 09/02/2010     Model #: [id number]     Serial #: [id number]     Status: active  Magnet Response Rate:  BOL 100 ERI 85  Indications:  Sick sinus syndrome

## 2010-12-11 NOTE — Cardiovascular Report (Signed)
Summary: Office Visit   Office Visit   Imported By: Roderic Ovens 09/24/2010 16:42:26  _____________________________________________________________________  External Attachment:    Type:   Image     Comment:   External Document

## 2010-12-11 NOTE — Cardiovascular Report (Signed)
Summary: Implantable Device Registration   Implantable Device Registration   Imported By: Roderic Ovens 09/24/2010 16:43:46  _____________________________________________________________________  External Attachment:    Type:   Image     Comment:   External Document

## 2010-12-25 NOTE — Cardiovascular Report (Signed)
Summary: Office Visit   Office Visit   Imported By: Roderic Ovens 12/16/2010 16:15:50  _____________________________________________________________________  External Attachment:    Type:   Image     Comment:   External Document

## 2010-12-31 NOTE — Letter (Signed)
Summary: Self-Recorded Meds  Self-Recorded Meds   Imported By: Marylou Mccoy 12/18/2010 18:27:21  _____________________________________________________________________  External Attachment:    Type:   Image     Comment:   External Document

## 2011-01-05 ENCOUNTER — Other Ambulatory Visit: Payer: Self-pay | Admitting: Hematology & Oncology

## 2011-01-05 ENCOUNTER — Encounter (HOSPITAL_BASED_OUTPATIENT_CLINIC_OR_DEPARTMENT_OTHER): Payer: Medicare Other | Admitting: Hematology & Oncology

## 2011-01-05 DIAGNOSIS — C8589 Other specified types of non-Hodgkin lymphoma, extranodal and solid organ sites: Secondary | ICD-10-CM

## 2011-01-05 DIAGNOSIS — C8590 Non-Hodgkin lymphoma, unspecified, unspecified site: Secondary | ICD-10-CM

## 2011-01-05 LAB — CBC WITH DIFFERENTIAL (CANCER CENTER ONLY)
BASO#: 0.1 10*3/uL (ref 0.0–0.2)
Eosinophils Absolute: 0.4 10*3/uL (ref 0.0–0.5)
HCT: 44.4 % (ref 38.7–49.9)
LYMPH%: 34.4 % (ref 14.0–48.0)
MCH: 32.7 pg (ref 28.0–33.4)
MCV: 98 fL (ref 82–98)
MONO%: 11.1 % (ref 0.0–13.0)
NEUT#: 4.1 10*3/uL (ref 1.5–6.5)
RDW: 12.1 % (ref 10.5–14.6)
WBC: 8.4 10*3/uL (ref 4.0–10.0)

## 2011-01-05 LAB — LACTATE DEHYDROGENASE: LDH: 236 U/L (ref 94–250)

## 2011-01-05 LAB — VITAMIN D 25 HYDROXY (VIT D DEFICIENCY, FRACTURES): Vit D, 25-Hydroxy: 34 ng/mL (ref 30–89)

## 2011-01-05 LAB — COMPREHENSIVE METABOLIC PANEL
Calcium: 10 mg/dL (ref 8.4–10.5)
Sodium: 141 mEq/L (ref 135–145)

## 2011-01-16 ENCOUNTER — Encounter (HOSPITAL_COMMUNITY)
Admission: RE | Admit: 2011-01-16 | Discharge: 2011-01-16 | Disposition: A | Discharge status: Home / Self Care | Payer: Medicare Other | Source: Ambulatory Visit | Attending: Hematology & Oncology | Admitting: Hematology & Oncology

## 2011-01-16 ENCOUNTER — Encounter (HOSPITAL_COMMUNITY): Payer: Self-pay

## 2011-01-16 DIAGNOSIS — X58XXXA Exposure to other specified factors, initial encounter: Secondary | ICD-10-CM | POA: Insufficient documentation

## 2011-01-16 DIAGNOSIS — C8590 Non-Hodgkin lymphoma, unspecified, unspecified site: Secondary | ICD-10-CM

## 2011-01-16 DIAGNOSIS — C8589 Other specified types of non-Hodgkin lymphoma, extranodal and solid organ sites: Secondary | ICD-10-CM | POA: Insufficient documentation

## 2011-01-16 DIAGNOSIS — S22009A Unspecified fracture of unspecified thoracic vertebra, initial encounter for closed fracture: Secondary | ICD-10-CM | POA: Insufficient documentation

## 2011-01-16 MED ORDER — FLUDEOXYGLUCOSE F - 18 (FDG) INJECTION: 18.5000 | Freq: Once | INTRAVENOUS | Status: AC | PRN

## 2011-01-20 LAB — BASIC METABOLIC PANEL
BUN: 25 mg/dL — ABNORMAL HIGH (ref 6–23)
Creatinine, Ser: 1.12 mg/dL (ref 0.4–1.5)
GFR calc Af Amer: >60 mL/min (ref >60)
GFR calc non Af Amer: >60 mL/min (ref >60)
Glucose, Bld: 184 mg/dL — ABNORMAL HIGH (ref 70–99)
Sodium: 136 mEq/L (ref 135–145)

## 2011-01-20 LAB — CBC
HCT: 42.7 % (ref 39.0–52.0)
MCH: 33.3 pg (ref 26.0–34.0)
MCHC: 33.7 g/dL (ref 30.0–36.0)
MCV: 98.8 fL (ref 78.0–100.0)
Platelets: 214 10*3/uL (ref 150–400)
RBC: 4.32 MIL/uL (ref 4.22–5.81)
WBC: 10.3 10*3/uL (ref 4.0–10.5)

## 2011-01-20 LAB — POCT CARDIAC MARKERS
CKMB, poc: 1.8 ng/mL (ref 1.0–8.0)
Myoglobin, poc: 122 ng/mL (ref 12–200)
Troponin i, poc: <0.05 ng/mL (ref 0.00–0.09)

## 2011-01-20 LAB — DIFFERENTIAL
Basophils Absolute: 0 10*3/uL (ref 0.0–0.1)
Eosinophils Relative: 0 % (ref 0–5)
Lymphocytes Relative: 15 % (ref 12–46)
Lymphs Abs: 1.5 10*3/uL (ref 0.7–4.0)
Monocytes Absolute: 0.4 10*3/uL (ref 0.1–1.0)

## 2011-01-20 LAB — PROTIME-INR
INR: 1.56 — ABNORMAL HIGH (ref 0.00–1.49)
Prothrombin Time: 18.9 seconds — ABNORMAL HIGH (ref 11.6–15.2)

## 2011-01-21 LAB — BASIC METABOLIC PANEL
BUN: 13 mg/dL (ref 6–23)
CO2: 29 mEq/L (ref 19–32)
CO2: 30 mEq/L (ref 19–32)
Calcium: 10.1 mg/dL (ref 8.4–10.5)
Chloride: 104 mEq/L (ref 96–112)
Chloride: 106 mEq/L (ref 96–112)
Creatinine, Ser: 1.16 mg/dL (ref 0.4–1.5)
GFR calc Af Amer: >60 mL/min (ref >60)
GFR calc Af Amer: >60 mL/min (ref >60)
GFR calc non Af Amer: >60 mL/min (ref >60)
Potassium: 3.5 mEq/L (ref 3.5–5.1)
Sodium: 140 mEq/L (ref 135–145)
Sodium: 143 mEq/L (ref 135–145)

## 2011-01-21 LAB — URINALYSIS, ROUTINE W REFLEX MICROSCOPIC
Bilirubin Urine: NEGATIVE
Glucose, UA: NEGATIVE mg/dL
Ketones, ur: NEGATIVE mg/dL
Protein, ur: NEGATIVE mg/dL

## 2011-01-21 LAB — CBC
HCT: 45.6 % (ref 39.0–52.0)
Hemoglobin: 15 g/dL (ref 13.0–17.0)
MCV: 98.5 fL (ref 78.0–100.0)
RBC: 4.6 MIL/uL (ref 4.22–5.81)
RBC: 4.63 MIL/uL (ref 4.22–5.81)
WBC: 11.8 10*3/uL — ABNORMAL HIGH (ref 4.0–10.5)
WBC: 8.1 10*3/uL (ref 4.0–10.5)

## 2011-01-21 LAB — CK TOTAL AND CKMB (NOT AT ARMC)
Relative Index: INVALID (ref 0.0–2.5)
Total CK: 96 U/L (ref 7–232)

## 2011-01-21 LAB — PROTIME-INR
INR: 0.98 (ref 0.00–1.49)
INR: 1.19 (ref 0.00–1.49)
Prothrombin Time: 15.3 seconds — ABNORMAL HIGH (ref 11.6–15.2)

## 2011-01-21 LAB — CARDIAC PANEL(CRET KIN+CKTOT+MB+TROPI)
CK, MB: 3.6 ng/mL (ref 0.3–4.0)
Relative Index: INVALID (ref 0.0–2.5)
Troponin I: 0.03 ng/mL (ref 0.00–0.06)

## 2011-01-21 LAB — DIFFERENTIAL
Basophils Relative: 1 % (ref 0–1)
Eosinophils Absolute: 0.1 10*3/uL (ref 0.0–0.7)
Eosinophils Relative: 1 % (ref 0–5)
Lymphocytes Relative: 27 % (ref 12–46)
Neutrophils Relative %: 60 % (ref 43–77)

## 2011-01-21 LAB — TROPONIN I: Troponin I: 0.01 ng/mL (ref 0.00–0.06)

## 2011-01-21 LAB — TSH: TSH: 2.195 u[IU]/mL (ref 0.350–4.500)

## 2011-01-26 LAB — CBC
MCHC: 34 g/dL (ref 30.0–36.0)
RBC: 4.33 MIL/uL (ref 4.22–5.81)
WBC: 10.2 10*3/uL (ref 4.0–10.5)

## 2011-02-12 LAB — DIFFERENTIAL
Basophils Absolute: 0 10*3/uL (ref 0.0–0.1)
Basophils Relative: 1 % (ref 0–1)
Monocytes Relative: 13 % — ABNORMAL HIGH (ref 3–12)
Neutro Abs: 5.8 10*3/uL (ref 1.7–7.7)
Neutrophils Relative %: 68 % (ref 43–77)

## 2011-02-12 LAB — POCT I-STAT, CHEM 8
Calcium, Ion: 1.17 mmol/L (ref 1.12–1.32)
Chloride: 101 mEq/L (ref 96–112)
HCT: 40 % (ref 39.0–52.0)
Hemoglobin: 13.6 g/dL (ref 13.0–17.0)
TCO2: 29 mmol/L (ref 0–100)

## 2011-02-12 LAB — GLUCOSE, CAPILLARY
Glucose-Capillary: 111 mg/dL — ABNORMAL HIGH (ref 70–99)
Glucose-Capillary: 101 mg/dL — ABNORMAL HIGH (ref 70–99)

## 2011-02-12 LAB — APTT: aPTT: 30 seconds (ref 24–37)

## 2011-02-12 LAB — PROTIME-INR: INR: 1.1 (ref 0.00–1.49)

## 2011-02-12 LAB — CBC
MCHC: 35 g/dL (ref 30.0–36.0)
RBC: 3.95 MIL/uL — ABNORMAL LOW (ref 4.22–5.81)
WBC: 8.5 10*3/uL (ref 4.0–10.5)

## 2011-02-17 LAB — GLUCOSE, CAPILLARY: Glucose-Capillary: 102 mg/dL — ABNORMAL HIGH (ref 70–99)

## 2011-02-23 LAB — GLUCOSE, CAPILLARY: Glucose-Capillary: 102 mg/dL — ABNORMAL HIGH (ref 70–99)

## 2011-02-27 ENCOUNTER — Encounter: Payer: Medicare Other | Admitting: *Deleted

## 2011-03-24 NOTE — Op Note (Signed)
NAMELUCION, DILGER               ACCOUNT NO.:  [id number]   MEDICAL RECORD NO.:  [id number]          PATIENT TYPE:  AMB   LOCATION:  DSC                          FACILITY:  [facility]   PHYSICIAN:  Katy Fitch. Sypher, M.D. DATE OF BIRTH:  May 27, 1923   DATE OF PROCEDURE:  05/31/2007  DATE OF DISCHARGE:                               OPERATIVE REPORT   PREOPERATIVE DIAGNOSIS:  Tophaceous olecranon bursitis, left elbow  associated with gout.   POSTOPERATIVE DIAGNOSIS:  Tophaceous olecranon bursitis, left elbow  associated with gout.   OPERATION:  Resection of left olecranon bursa and tophaceous deposits  from subdermal region of olecranon.   OPERATING SURGEON:  Josephine Igo, M.D.   ASSISTANT:  Molly Maduro Dasnoit PA-C.   ANESTHESIA:  General by LMA, supervising anesthesiologist is Dr. Krista Blue.   INDICATIONS:  [name] is a 77 year old gentleman referred through  the courtesy of Dr. Kirby Funk for evaluation and management of a  chronic tophaceous left olecranon bursitis.   Mr. Meskill has a history of gout treated with colchicine and  allopurinol.  He has history of non-Hodgkins lymphoma and on chronic  Lasix therapy for hypertension and mild congestive heart failure.   Due to development of a large plum sized olecranon bursitis that has  failed nonoperative measures, he is brought to the operating room at  this time for excision of his left olecranon bursa.   Recently he has seen his oncologist, Dr. Myna Hidalgo and has been advised  that he may be showing signs of recurrence of his non-Hodgkin's  lymphoma.   [facility].   Preoperatively we discussed risks and benefits of excision of the  olecranon bursa.  He understands he should continue to take his  colchicine and allopurinol and that a flare of gout following  instrumentation of a tophaceous deposit is possible.   After informed consent he is brought to  the operating room at this time.   PROCEDURE:  Ares Cardozo is brought to operating room and placed supine  position on the operating table.   Following induction of general anesthesia by LMA technique, the left arm  was prepped with Betadine soap solution, sterilely draped.  A pneumatic  tourniquet was applied to proximal left brachium.   Following exsanguination of the left arm with Esmarch bandage arterial  tourniquet inflated to 230 mmHg.  Procedure commenced with a curvilinear  incision over the olecranon avoiding the olecranon tip.  The  subcutaneous tissues were carefully divided taking care to identify the  margins of the olecranon bursa.  The bursa was dissected on the  subdermal plane, subsequently followed down the periosteum of the ulna  and circumferentially dissected off the ulna and triceps tendon.  Several veins were passing through the peri bursal tissues.  These were  electrocauterized and transected.  The entire bursa was removed and  passed off as a pathologic specimen.  There was a tophaceous deposit  both within the bursa and some hematoma within the bursa.  Additionally  there were tophaceous deposits in the subdermal tissues of  the  olecranon.  These were removed with use of a small rongeur with a  subdermal dissection.   Bleeding points were electrocauterized bipolar current followed by  repair of the skin with subdermal suture of 4-0 Vicryl and intradermal 3-  0 Prolene segmental sutures with Steri-Strips.   The olecranon bursa region was infiltrated with 2% lidocaine for  postoperative analgesia.   There no apparent complications.   Mr. Leider tolerated surgery and anesthesia well.  He is discharged  with instructions to resume all his routine medications per Dr. Valentina Lucks  and will begin his chemotherapy with Dr. Myna Hidalgo in nine days.   We will leave his sutures in an additional four to five days due to his  underlying mitotic disease and the  initiation of chemotherapy.      Katy Fitch Sypher, M.D.  Electronically Signed     RVS/MEDQ  D:  05/31/2007  T:  06/01/2007  Job:  [id number]   cc:   Rose Phi. Myna Hidalgo, M.D.

## 2011-03-24 NOTE — Procedures (Signed)
DUPLEX DEEP VENOUS EXAM - LOWER EXTREMITY   INDICATION:  Venous insufficiency.   HISTORY:  Edema:  Yes.  Trauma/Surgery:  No.  Pain:  Yes.  PE:  Yes.  Previous DVT:  No.  Anticoagulants:  No.  Other:  No.   DUPLEX EXAM:                CFV   SFV   PopV  PTV    GSV                R  L  R  L  R  L  R   L  R  L  Thrombosis    0  0  0  0  0  0  0   0  0  0  Spontaneous   +  +  +  +  +  +  +   +  +  +  Phasic        +  +  +  +  +  +  +   +  +  +  Augmentation  +  +  +  +  +  +  +   +  +  +  Compressible  +  +  +  +  +     +   +  +  +  Competent     +  0  0  0  0  0  +   +  +  +   Legend:  + - yes  o - no  p - partial  D - decreased   IMPRESSION:  There does not appear to be any deep vein thrombus noted in  bilateral legs.  There does appear to be venous insufficiency in  bilateral superficial femoral veins and popliteal veins.  The left  common femoral vein appears to have venous insufficiency also.    _____________________________  Janetta Hora. Fields, MD   CB/MEDQ  D:  02/27/2010  T:  02/27/2010  Job:  [id number]

## 2011-03-27 NOTE — H&P (Signed)
[name], [name]               ACCOUNT NO.:  [id number]   MEDICAL RECORD NO.:  [id number]          PATIENT TYPE:  OUT   LOCATION:  XRAY                         FACILITY:  [facility]   PHYSICIAN:  Rose Phi. Myna Hidalgo, M.D. DATE OF BIRTH:  [birth year]   DATE OF ADMISSION:  06/03/2005  DATE OF DISCHARGE:                                HISTORY & PHYSICAL   DIAGNOSIS:  Follicular large cell non-Hodgkin's lymphoma.   HISTORY OF PRESENT ILLNESS:  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  He is  followed by Dr. Kirby Thornton and was found to have enlarged lymph nodes in  his left supraclavicular area.  These were painless.  A bone marrow biopsy  on Mar 10, 2005 ([id number]) showed no sign of lymphoma.  A MUGA scan was done  on him and his ejection fraction showed 55%.  PET scan showed multiple  activity areas all above the diaphragm.  His IPI score was 2.0 which puts  him at low-intermediate risk.  He is now status post three cycles of CHOP  rituxan last being given May 21, 2005.  He was seen by Dr. Arlan Organ  one week ago and was found to have some shortness of breath and increased  weakness and fatigue; these symptoms have all increased over the past seven  days with his O2 sats resting at 91-93, and extreme fatigue, increased  shortness of breath, and increased chest pain with respirations in the  midsternal area.  Repeat chest x-ray shows stable bronchial changes noted on  the last x-ray one week ago, breathing was much improved on oxygen at two  liters.  While awaiting a bed, we will go ahead and give [name] 60 mg  of Lasix and plan for admission for more evaluation, possibly cardiac versus  pneumonia.   PAST MEDICAL HISTORY:  1.  Hypertension.  2.  Coronary artery disease status post bypass.  3.  Gout.  4.  Knee surgery.  5.  Lumbar spine surgery.  6.  Eye surgery.   ALLERGIES:  1.  CODEINE.  2.   INDOCIN.   CURRENT MEDICATIONS:  1.  ASA 81 mg one p.o. daily.  2.  Nexium 40 mg one p.o. daily.  3.  Colchicine 0.6 mg one p.o. daily.  4.  Xanax 0.5 mg one p.o. q.h.s.  5.  Atenolol 50 mg one p.o. daily.  6.  Hydrochlorothiazide 12.5 mg one p.o. daily.   SOCIAL HISTORY:  [name] is married.  He used to work at [employer].  He has not smoked for 60 years.  He has no occupational exposures.  Social  history is negative for alcohol and tobacco.   FAMILY HISTORY:  Negative for cancer.  There is a history of diabetes,  coronary artery disease and COPD in the family.   REVIEW OF SYSTEMS:  As previously stated.  He has had no diarrhea, no  abdominal pain, no blurred vision, no headaches.  Primarily symptoms have  included increased shortness of breath, midsternal pleuritic-type pain,  severe weakness and fatigue, edema of the lower extremities, decreased  appetite and fluid intake.   PHYSICAL EXAMINATION:  Temperature 97.5, blood pressure 107/65, pulse 72,  respirations 20, weight is up 205.2 pounds.  HEENT:  Normocephalic and atraumatic.  Sclerae anicteric.  There are some  palpable left supraclavicular lymph nodes, somewhat shotty.  LUNGS:  There are a few coarse crackles bibasilar that were a little more on  the right than the left.  No notable wheezes.  ABDOMEN:  Fairly firm, nontender, no organomegaly.  EXTREMITIES:  Lower extremities with edema to the knees.   LABS TODAY:  WBC 11.4, ANC 7.9, hemoglobin 13.9, hematocrit 40.4, platelets  352,000.   IMPRESSION/PLAN:  [name] is a 77 year old gentleman with follicular  large cell lymphoma now status post three cycles of CHOP rituxan.   We will admit for further workup for increasing dyspnea and to rule out  pneumonia and/or congestive heart failure.  [name] did receive 60 mg  of Lasix in the office prior to admission.  We will do a strict I&Os, weigh  daily,  and do a MUGA scan.  At that point if need be, we  will also put him on  cardiac monitor and call Dr. Katrinka Thornton in for consultation, if the MUGA does  indicate any heart failure.   This is Rosemarie Ax, N.P. dictating for Dr. Arlan Organ who saw and  examined [name] in the office.       JB/MEDQ  D:  06/03/2005  T:  06/03/2005  Job:  [id number]   cc:   Thora Lance, M.D.  [address] [place]  [place] [address]  Fax: [phone]   Gita Kudo, M.D.  [address]  [place]  [place] [address]   Lyn Records, M.D.  [address]  [place]  [place] [address]  Fax: [phone]

## 2011-03-27 NOTE — Op Note (Signed)
NAMECLAYBORN, MILNES               ACCOUNT NO.:  [id number]   MEDICAL RECORD NO.:  [id number]          PATIENT TYPE:  OIB   LOCATION:  2899                         FACILITY:  [facility]   PHYSICIAN:  Gita Kudo, M.D. DATE OF BIRTH:  [birth year]   DATE OF PROCEDURE:  02/17/2005  DATE OF DISCHARGE:                                 OPERATIVE REPORT   OPERATIVE PROCEDURE:  Excisional biopsy lymph nodes - a left  supraclavicular, right axilla.   SURGEON:  Gita Kudo, M.D.   ANESTHESIA:  General.   PREOPERATIVE DIAGNOSIS:  Lymphadenopathy.   HISTORY OF PRESENT ILLNESS:  This 77 year old gentleman is brought in for  elective lymph node biopsy. He has been having some vague abdominal symptoms  and workup showed a mass in the abdomen with multiple lymph nodes and  additional CT study showed a large nodes of his axilla and supraclavicular  region. He has felt the one in his neck and also then developed a one in his  right axilla. This has been rather sudden in onset and because of  possibility of malignancy, comes in for biopsy.   OPERATIVE FINDINGS:  The patient multiple lymph nodes in the right axilla  and I removed one matted clump and then a second individual one. There was  just one palpable one in the left neck. There were no complications.   OPERATIVE PROCEDURE:  Under satisfactory general anesthesia, the patient was  positioned, prepped and draped in standard fashion. The left neck node had  been previously marked and was excised first. Transverse incision made and  cautery used to enter the deep cervical fascia. Then blunt dissection was  used to deliver the node. The stalk was clamped and then node was excised  and sent to pathology in saline. The stalk was tied with 2-0 Vicryl. The  wound was infiltrated with 10 cc of 0.5% Marcaine and then closed with 3-0  Vicryl followed by staples.   The right axillary node was approached in a similar fashion. Transverse  incision  made, cautery used to dissect down to the axilla. The node was  easily palpable, delivered and taken between clamps and cutting. Then ties  of 2-0 silk were applied and this specimen likewise sent. Another node was  identified and since it was so close, was also  removed. Then the wound was checked for hemostasis by cautery, lavaged with  saline, infiltrated with Marcaine and closed with 3-0 Vicryl followed by  skin staples. Sterile dressings were applied. The patient went to the  recovery room from the operating room in good condition without  complication.      MRL/MEDQ  D:  02/17/2005  T:  02/17/2005  Job:  [id number]   cc:   Thora Lance, M.D.  [address]  [place]  [place] [address]  Fax: [phone]

## 2011-03-27 NOTE — Op Note (Signed)
NAMEAILTON, VALLEY               ACCOUNT NO.:  [id number]   MEDICAL RECORD NO.:  [id number]          PATIENT TYPE:  OUT   LOCATION:  OMED                         [facility]ACILITY[facility]  [facility]   PHYSICIAN:  Rose Phi. Myna Hidalgo, M.D. DATE OF BIRTH:  30-Jul-1923   DATE OF PROCEDURE:  03/10/2005  DATE OF DISCHARGE:                                 OPERATIVE REPORT   NATURE OF PROCEDURE:  Left posterior iliac crest bone marrow biopsy and  aspirate.   Mr. Gutridge was brought to the short stay unit. He had an IV placed into  his right hand. He received a total of 5 mg Versed and 12.5 mg of Demerol  for IV sedation.   The left posterior iliac crest region was prepped and draped in the sterile  fashion.  10 mL of 2% lidocaine was infiltrated under the skin down to the  periosteum.   A #11 scalpel was used to make an incision into the skin. Two bone marrow  aspirates were obtained without difficulties.   A second incision was made into the skin. A bone marrow biopsy core was  obtained without difficulties.   The patient tolerated the procedure well. There were no complications.      PRE/MEDQ  D:  03/10/2005  T:  03/10/2005  Job:  [id number]

## 2011-03-27 NOTE — Discharge Summary (Signed)
[name], KNAGGS               ACCOUNT NO.:  [id number]   MEDICAL RECORD NO.:  [id number]          PATIENT TYPE:  INP   LOCATION:  1404                         FACILITY:  [facility]   PHYSICIAN:  Rose Phi. Myna Hidalgo, M.D. DATE OF BIRTH:  1923-10-28   DATE OF ADMISSION:  06/03/2005  DATE OF DISCHARGE:  06/06/2005                                 DISCHARGE SUMMARY   CONDITION UPON DISCHARGE:  Improved.   DISCHARGE DIAGNOSES:  1.  Pneumonia.  2.  Congestive heart failure.  3.  Coronary artery disease.  4.  Status post aortocoronary bypass.  5.  Lymphoma.  6.  Hypokalemia.  7.  Follicular large cell non-Hodgkin's lymphoma.   Mr. Raska was seen in the office with increasing shortness of breath. He  was admitted for further evaluation. Blood cultures were ordered. He  continued most of his home medication. A MUGA scan was ordered to evaluate  for left ventricular function. The MUGA revealed an ejection fraction at  52%. EKG was abnormal showing left axis deviation and a left bundle-branch  block. Chest x-ray showed right basilar pneumonia, stable cardiomegaly, and  mild chronic interstitial lung disease. Mr. Hoard Port-A-Cath was  accessed. Lasix was ordered. He did have some diarrhea. He was on a cardiac  monitor but this was able to be discontinued on July 28. He was able to come  off of his O2 and discharged home improved by July 29.   Admission laboratory data:  WBC 9.7; hemoglobin 13.6; hematocrit 39.5;  platelets at 341,000. Sodium was 137, potassium was low at 3.2, glucose was  at 112, BUN 16, creatinine 1.3, calcium was 9.4, total protein 6.2, and  albumin was low at 2.9.   Fortunately, Mr. Bueche was improved with diuretics. He did not require  any consultation, and was discharged.      Rosemarie Ax, N.P.      Rose Phi. Myna Hidalgo, M.D.  Electronically Signed    JB/MEDQ  D:  06/18/2005  T:  06/18/2005  Job:  [id number]

## 2011-04-03 ENCOUNTER — Other Ambulatory Visit: Payer: Self-pay | Admitting: Dermatology

## 2011-05-18 ENCOUNTER — Encounter (HOSPITAL_BASED_OUTPATIENT_CLINIC_OR_DEPARTMENT_OTHER): Payer: Medicare Other | Admitting: Hematology & Oncology

## 2011-05-18 ENCOUNTER — Other Ambulatory Visit: Payer: Self-pay | Admitting: Hematology & Oncology

## 2011-05-18 DIAGNOSIS — C8589 Other specified types of non-Hodgkin lymphoma, extranodal and solid organ sites: Secondary | ICD-10-CM

## 2011-05-18 LAB — CBC WITH DIFFERENTIAL (CANCER CENTER ONLY)
BASO#: 0 10*3/uL (ref 0.0–0.2)
EOS%: 1.4 % (ref 0.0–7.0)
Eosinophils Absolute: 0.2 10*3/uL (ref 0.0–0.5)
HGB: 14.9 g/dL (ref 13.0–17.1)
LYMPH#: 2.7 10*3/uL (ref 0.9–3.3)
MONO%: 11.1 % (ref 0.0–13.0)
NEUT#: 8.5 10*3/uL — ABNORMAL HIGH (ref 1.5–6.5)
Platelets: 188 10*3/uL (ref 145–400)
RBC: 4.38 10*6/uL (ref 4.20–5.70)

## 2011-05-19 LAB — COMPREHENSIVE METABOLIC PANEL
AST: 23 U/L (ref 0–37)
Albumin: 3.8 g/dL (ref 3.5–5.2)
Alkaline Phosphatase: 65 U/L (ref 39–117)
BUN: 24 mg/dL — ABNORMAL HIGH (ref 6–23)
Calcium: 9.7 mg/dL (ref 8.4–10.5)
Chloride: 99 mEq/L (ref 96–112)
Glucose, Bld: 97 mg/dL (ref 70–99)
Potassium: 3.2 mEq/L — ABNORMAL LOW (ref 3.5–5.3)
Sodium: 137 mEq/L (ref 135–145)
Total Protein: 6.3 g/dL (ref 6.0–8.3)

## 2011-08-11 ENCOUNTER — Encounter: Payer: Medicare Other | Admitting: Internal Medicine

## 2011-08-13 ENCOUNTER — Encounter: Payer: Self-pay | Admitting: Internal Medicine

## 2011-08-14 ENCOUNTER — Encounter: Payer: Self-pay | Admitting: Internal Medicine

## 2011-08-14 ENCOUNTER — Ambulatory Visit (INDEPENDENT_AMBULATORY_CARE_PROVIDER_SITE_OTHER): Payer: Medicare Other | Admitting: Internal Medicine

## 2011-08-14 VITALS — BP 150/93 | HR 76 | Ht 64.0 in | Wt 210.0 lb

## 2011-08-14 DIAGNOSIS — I495 Sick sinus syndrome: Secondary | ICD-10-CM

## 2011-08-14 DIAGNOSIS — I4891 Unspecified atrial fibrillation: Secondary | ICD-10-CM

## 2011-08-14 DIAGNOSIS — Z950 Presence of cardiac pacemaker: Secondary | ICD-10-CM

## 2011-08-14 DIAGNOSIS — I5032 Chronic diastolic (congestive) heart failure: Secondary | ICD-10-CM

## 2011-08-14 DIAGNOSIS — I509 Heart failure, unspecified: Secondary | ICD-10-CM

## 2011-08-14 LAB — PACEMAKER DEVICE OBSERVATION
AL AMPLITUDE: 1.2 mv
AL IMPEDENCE PM: 400 Ohm
BAMS-0001: 150 {beats}/min
BAMS-0003: 70 {beats}/min
RV LEAD AMPLITUDE: 12 mv
RV LEAD IMPEDENCE PM: 737.5 Ohm
RV LEAD THRESHOLD: 0.625 V

## 2011-08-14 NOTE — Assessment & Plan Note (Signed)
His device is working normally. We'll recheck in several months. 

## 2011-08-14 NOTE — Progress Notes (Signed)
HPI [name] returns today for followup. He is a very pleasant 77 year old man with a history of symptomatic bradycardia and paroxysmal now permanent atrial fibrillation. He is status post pacemaker insertion. Despite his advanced age he has done well. He doesn't been sedentary but denies chest pain, shortness of breath, but does have peripheral edema. He has not had syncope. Allergies  Allergen Reactions  . Amlodipine Besylate   . Azithromycin   . Codeine   . Digoxin   . Indomethacin   . Lorazepam   . Sulfonamide Derivatives      Current Outpatient Prescriptions  Medication Sig Dispense Refill  . acetaminophen (TYLENOL) 325 MG tablet Take 650 mg by mouth every 6 (six) hours as needed.        . ALPRAZolam (XANAX) 0.5 MG tablet Take 0.5 mg by mouth at bedtime as needed.        Marland Kitchen aspirin 81 MG tablet Take 81 mg by mouth daily.        Marland Kitchen atenolol (TENORMIN) 50 MG tablet Take 50 mg by mouth daily.        . cholecalciferol (VITAMIN D) 1000 UNITS tablet Take 1,000 Units by mouth daily.        . colchicine 0.6 MG tablet Take 0.6 mg by mouth daily.        Marland Kitchen esomeprazole (NEXIUM) 40 MG capsule Take 40 mg by mouth as needed.       . furosemide (LASIX) 80 MG tablet Take 80 mg by mouth 2 (two) times daily.        Marland Kitchen gabapentin (NEURONTIN) 100 MG capsule Take 100 mg by mouth 3 (three) times daily.        . meclizine (ANTIVERT) 12.5 MG tablet Take 12.5 mg by mouth 3 (three) times daily as needed. 1/2 tablet        . metolazone (ZAROXOLYN) 2.5 MG tablet Take 2.5 mg by mouth as needed.       . nitroGLYCERIN (NITROSTAT) 0.4 MG SL tablet Place 0.4 mg under the tongue every 5 (five) minutes as needed.        . potassium chloride SA (K-DUR,KLOR-CON) 20 MEQ tablet Take 20 mEq by mouth daily.        . predniSONE (DELTASONE) 5 MG tablet Take 5 mg by mouth. As directed      . traMADol (ULTRAM) 50 MG tablet Take 50 mg by mouth. As directed      . warfarin (COUMADIN) 2 MG tablet Take 2 mg by mouth as  directed.           Past Medical History  Diagnosis Date  . Hypertension   . CHF (congestive heart failure)   . Arrhythmia     afib    ROS:   All systems reviewed and negative except as noted in the HPI.   Past Surgical History  Procedure Date  . Pacemaker insertion      No family history on file.   History   Social History  . Marital Status: Married    Spouse Name: N/A    Number of Children: N/A  . Years of Education: N/A   Occupational History  . Not on file.   Social History Main Topics  . Smoking status: Not on file  . Smokeless tobacco: Not on file  . Alcohol Use: Not on file  . Drug Use: Not on file  . Sexually Active: Not on file   Other Topics Concern  . Not on file  Social History Narrative  . No narrative on file     BP 150/93  Pulse 76  Ht 5\' 4"  (1.626 m)  Wt 210 lb (95.255 kg)  BMI 36.05 kg/m2  Physical Exam:  Well appearing NAD HEENT: Unremarkable Neck:  No JVD, no thyromegally Lymphatics:  No adenopathy Back:  No CVA tenderness Lungs:  Clear with no wheezes, rales, or rhonchi. Well-healed pacemaker incision. HEART:  Iregular rate rhythm, no murmurs, no rubs, no clicks Abd:  soft, positive bowel sounds, no organomegally, no rebound, no guarding Ext:  2 plus pulses, no edema, no cyanosis, no clubbing Skin:  No rashes no nodules Neuro:  CN II through XII intact, motor grossly intact  DEVICE  Normal device function.  See PaceArt for details.   Assess/Plan:

## 2011-08-14 NOTE — Assessment & Plan Note (Signed)
His ventricular rate appears to be well controlled. He denies palpitations. He will continue his current medical therapy. 

## 2011-08-14 NOTE — Patient Instructions (Signed)
Your physician wants you to follow-up in: YEAR WITH DR TAYLOR You will receive a reminder letter in the mail two months in advance. If you don't receive a letter, please call our office to schedule the follow-up appointment. Your physician recommends that you continue on your current medications as directed. Please refer to the Current Medication list given to you today. 

## 2011-08-14 NOTE — Assessment & Plan Note (Signed)
His chronic diastolic heart failure appears to be class 1-2. He will continue his current medical therapy and maintain a low-sodium diet.

## 2011-08-24 LAB — BASIC METABOLIC PANEL
BUN: 12
Creatinine, Ser: 1.1
GFR calc non Af Amer: >60
Glucose, Bld: 126 — ABNORMAL HIGH

## 2011-08-24 LAB — POCT HEMOGLOBIN-HEMACUE: Hemoglobin: 16.9

## 2011-09-08 ENCOUNTER — Other Ambulatory Visit: Payer: Self-pay | Admitting: Dermatology

## 2011-10-21 ENCOUNTER — Other Ambulatory Visit (HOSPITAL_COMMUNITY): Payer: Self-pay | Admitting: Internal Medicine

## 2011-10-21 DIAGNOSIS — M25572 Pain in left ankle and joints of left foot: Secondary | ICD-10-CM

## 2011-10-28 ENCOUNTER — Encounter (HOSPITAL_COMMUNITY)
Admission: RE | Admit: 2011-10-28 | Discharge: 2011-10-28 | Disposition: A | Discharge status: Home / Self Care | Payer: Medicare Other | Source: Ambulatory Visit | Attending: Internal Medicine | Admitting: Internal Medicine

## 2011-10-28 DIAGNOSIS — M25572 Pain in left ankle and joints of left foot: Secondary | ICD-10-CM

## 2011-10-28 DIAGNOSIS — M25579 Pain in unspecified ankle and joints of unspecified foot: Secondary | ICD-10-CM | POA: Insufficient documentation

## 2011-10-28 DIAGNOSIS — R609 Edema, unspecified: Secondary | ICD-10-CM | POA: Insufficient documentation

## 2011-10-28 MED ORDER — TECHNETIUM TC 99M MEDRONATE IV KIT
25.0000 | PACK | Freq: Once | INTRAVENOUS | Status: AC | PRN
  Administered 2011-10-28: 25 via INTRAVENOUS

## 2011-11-04 ENCOUNTER — Encounter (HOSPITAL_BASED_OUTPATIENT_CLINIC_OR_DEPARTMENT_OTHER): Payer: Medicare Other | Attending: Plastic Surgery

## 2011-11-04 DIAGNOSIS — C8589 Other specified types of non-Hodgkin lymphoma, extranodal and solid organ sites: Secondary | ICD-10-CM | POA: Insufficient documentation

## 2011-11-04 DIAGNOSIS — N529 Male erectile dysfunction, unspecified: Secondary | ICD-10-CM | POA: Insufficient documentation

## 2011-11-04 DIAGNOSIS — M109 Gout, unspecified: Secondary | ICD-10-CM | POA: Insufficient documentation

## 2011-11-04 DIAGNOSIS — Z79899 Other long term (current) drug therapy: Secondary | ICD-10-CM | POA: Insufficient documentation

## 2011-11-04 DIAGNOSIS — I10 Essential (primary) hypertension: Secondary | ICD-10-CM | POA: Insufficient documentation

## 2011-11-04 DIAGNOSIS — I2510 Atherosclerotic heart disease of native coronary artery without angina pectoris: Secondary | ICD-10-CM | POA: Insufficient documentation

## 2011-11-04 DIAGNOSIS — Z86711 Personal history of pulmonary embolism: Secondary | ICD-10-CM | POA: Insufficient documentation

## 2011-11-04 DIAGNOSIS — E785 Hyperlipidemia, unspecified: Secondary | ICD-10-CM | POA: Insufficient documentation

## 2011-11-04 DIAGNOSIS — L97809 Non-pressure chronic ulcer of other part of unspecified lower leg with unspecified severity: Secondary | ICD-10-CM | POA: Insufficient documentation

## 2011-11-04 DIAGNOSIS — E119 Type 2 diabetes mellitus without complications: Secondary | ICD-10-CM | POA: Insufficient documentation

## 2011-11-04 DIAGNOSIS — K219 Gastro-esophageal reflux disease without esophagitis: Secondary | ICD-10-CM | POA: Insufficient documentation

## 2011-11-04 DIAGNOSIS — M7989 Other specified soft tissue disorders: Secondary | ICD-10-CM | POA: Insufficient documentation

## 2011-11-04 DIAGNOSIS — Z7982 Long term (current) use of aspirin: Secondary | ICD-10-CM | POA: Insufficient documentation

## 2011-11-05 NOTE — Progress Notes (Signed)
[facility]  NAME:  [name], [name]               ACCOUNT NO.:  [id number]  MEDICAL RECORD NO.:  [id number]      DATE OF BIRTH:  1923-01-25  PHYSICIAN:  Wayland Denis, DO       VISIT DATE:  11/04/2011                                  OFFICE VISIT   HISTORY OF PRESENT ILLNESS:  [name] is an 77 year old gentleman who is here for evaluation of his left lower extremity leg swelling.  He was recently started on Augmentin and referred here for further evaluation.  He has had episodes of ulcers on his lower extremities over a number of years that clear up and then occur again.  Recently, this one has been present for a couple of weeks and he has not been able to get it to heal.  He has multiple medical conditions, which are being treated by Dr. Kirby Funk at [facility].  He has not been doing anything for the wounds at present.  He was using a compression stocking, but then it got too difficult to put on.  Nothing seems to make it better and fluid retention of course makes it worse.  PAST MEDICAL HISTORY: 1. Coronary artery disease. 2. Diabetes. 3. Hypertension. 4. Follicular large cell non-Hodgkin lymphoma. 5. Erectile dysfunction. 6. Pulmonary emboli in 2007. 7. Gastroesophageal reflux disease. 8. Hyperlipidemia. 9. Gout. 10.Peptic ulcers with H. pylori that was treated.  MEDICATIONS:  Include Tylenol, vitamin D, meclizine, Nexium, prednisone, gabapentin, allopurinol, Xanax, Coumadin, Klor-Con, metolazone, nitroglycerin, aspirin, atenolol, Lasix, Eucerin cream with triamcinolone.  ALLERGIES:  Include LORAZEPAM, AZITHROMYCIN, AUGMENTIN, QUINAPRIL, NORPACE, INDOCIN, LANOXIN, BACTRIM, PERCOCET, AVELOX, ALEVE, PRAVACHOL, LIPITOR.  SOCIAL HISTORY:  Currently lives at home.  Does not smoke.  PHYSICAL EXAMINATION:  GENERAL:  He is alert and oriented, cooperative, in no acute distress. HEENT:  Pupils are equal.  Extraocular muscles are  intact. NECK:  No cervical lymphadenopathy.  No tenderness. RESPIRATORY:  Breathing is unlabored. HEART:  Rate is regular. ABDOMEN:  Soft and nontender. LOWER EXTREMITY:  Severe swelling on the left with skin breakdown on the anterolateral aspect of the ankle area.  I am not able to feel pulses because he is so swollen.  PLAN:  The plan will be for Profore Lite vascular studies, collagen, elevation, multivitamin, and I will talk with Dr. Valentina Lucks to see if we can start vitamin A.     Wayland Denis, DO     CS/MEDQ  D:  11/04/2011  T:  11/04/2011  Job:  [id number]

## 2011-11-09 ENCOUNTER — Emergency Department (HOSPITAL_COMMUNITY): Payer: Medicare Other

## 2011-11-09 ENCOUNTER — Encounter (HOSPITAL_COMMUNITY): Payer: Self-pay | Admitting: *Deleted

## 2011-11-09 ENCOUNTER — Inpatient Hospital Stay (HOSPITAL_COMMUNITY)
Admission: EM | Admit: 2011-11-09 | Discharge: 2011-11-18 | DRG: 641 | Disposition: A | Discharge status: Skilled Nursing Facility | Payer: Medicare Other | Attending: Internal Medicine | Admitting: Internal Medicine

## 2011-11-09 ENCOUNTER — Other Ambulatory Visit: Payer: Self-pay

## 2011-11-09 DIAGNOSIS — M109 Gout, unspecified: Secondary | ICD-10-CM | POA: Diagnosis present

## 2011-11-09 DIAGNOSIS — M7989 Other specified soft tissue disorders: Secondary | ICD-10-CM

## 2011-11-09 DIAGNOSIS — R531 Weakness: Secondary | ICD-10-CM

## 2011-11-09 DIAGNOSIS — C8589 Other specified types of non-Hodgkin lymphoma, extranodal and solid organ sites: Secondary | ICD-10-CM | POA: Diagnosis present

## 2011-11-09 DIAGNOSIS — Z886 Allergy status to analgesic agent status: Secondary | ICD-10-CM

## 2011-11-09 DIAGNOSIS — Z7901 Long term (current) use of anticoagulants: Secondary | ICD-10-CM

## 2011-11-09 DIAGNOSIS — E876 Hypokalemia: Principal | ICD-10-CM | POA: Diagnosis present

## 2011-11-09 DIAGNOSIS — T502X5A Adverse effect of carbonic-anhydrase inhibitors, benzothiadiazides and other diuretics, initial encounter: Secondary | ICD-10-CM | POA: Diagnosis present

## 2011-11-09 DIAGNOSIS — Z950 Presence of cardiac pacemaker: Secondary | ICD-10-CM

## 2011-11-09 DIAGNOSIS — L02419 Cutaneous abscess of limb, unspecified: Secondary | ICD-10-CM | POA: Diagnosis present

## 2011-11-09 DIAGNOSIS — I2510 Atherosclerotic heart disease of native coronary artery without angina pectoris: Secondary | ICD-10-CM | POA: Diagnosis present

## 2011-11-09 DIAGNOSIS — I872 Venous insufficiency (chronic) (peripheral): Secondary | ICD-10-CM | POA: Diagnosis present

## 2011-11-09 DIAGNOSIS — Z888 Allergy status to other drugs, medicaments and biological substances status: Secondary | ICD-10-CM

## 2011-11-09 DIAGNOSIS — Z882 Allergy status to sulfonamides status: Secondary | ICD-10-CM

## 2011-11-09 DIAGNOSIS — L97309 Non-pressure chronic ulcer of unspecified ankle with unspecified severity: Secondary | ICD-10-CM | POA: Diagnosis present

## 2011-11-09 DIAGNOSIS — I509 Heart failure, unspecified: Secondary | ICD-10-CM | POA: Diagnosis present

## 2011-11-09 DIAGNOSIS — Z951 Presence of aortocoronary bypass graft: Secondary | ICD-10-CM

## 2011-11-09 DIAGNOSIS — I10 Essential (primary) hypertension: Secondary | ICD-10-CM | POA: Diagnosis present

## 2011-11-09 DIAGNOSIS — I4891 Unspecified atrial fibrillation: Secondary | ICD-10-CM | POA: Diagnosis present

## 2011-11-09 DIAGNOSIS — Z4800 Encounter for change or removal of nonsurgical wound dressing: Secondary | ICD-10-CM

## 2011-11-09 DIAGNOSIS — Z7982 Long term (current) use of aspirin: Secondary | ICD-10-CM

## 2011-11-09 DIAGNOSIS — L03119 Cellulitis of unspecified part of limb: Secondary | ICD-10-CM | POA: Diagnosis present

## 2011-11-09 DIAGNOSIS — I5032 Chronic diastolic (congestive) heart failure: Secondary | ICD-10-CM | POA: Diagnosis present

## 2011-11-09 DIAGNOSIS — Z79899 Other long term (current) drug therapy: Secondary | ICD-10-CM

## 2011-11-09 HISTORY — DX: Polyneuropathy, unspecified: G62.9

## 2011-11-09 LAB — BASIC METABOLIC PANEL
BUN: 24 mg/dL — ABNORMAL HIGH (ref 6–23)
CO2: 37 mEq/L — ABNORMAL HIGH (ref 19–32)
Calcium: 10.3 mg/dL (ref 8.4–10.5)
Chloride: 90 mEq/L — ABNORMAL LOW (ref 96–112)
Creatinine, Ser: 1 mg/dL (ref 0.50–1.35)
GFR calc Af Amer: 76 mL/min — ABNORMAL LOW (ref >90)
GFR calc non Af Amer: 66 mL/min — ABNORMAL LOW (ref >90)
Glucose, Bld: 106 mg/dL — ABNORMAL HIGH (ref 70–99)
Potassium: 2.3 mEq/L — CL (ref 3.5–5.1)
Sodium: 136 mEq/L (ref 135–145)

## 2011-11-09 LAB — DIFFERENTIAL
Basophils Absolute: 0 10*3/uL (ref 0.0–0.1)
Basophils Relative: 0 % (ref 0–1)
Eosinophils Absolute: 0.2 10*3/uL (ref 0.0–0.7)
Neutro Abs: 5.8 10*3/uL (ref 1.7–7.7)
Neutrophils Relative %: 59 % (ref 43–77)

## 2011-11-09 LAB — MAGNESIUM: Magnesium: 1.9 mg/dL (ref 1.5–2.5)

## 2011-11-09 LAB — CBC
HCT: 41.8 % (ref 39.0–52.0)
Hemoglobin: 14.4 g/dL (ref 13.0–17.0)
MCH: 33.6 pg (ref 26.0–34.0)
MCH: 33.6 pg (ref 26.0–34.0)
MCHC: 34.4 g/dL (ref 30.0–36.0)
MCHC: 34.3 g/dL (ref 30.0–36.0)
MCV: 97.4 fL (ref 78.0–100.0)
Platelets: 212 10*3/uL (ref 150–400)
Platelets: 222 10*3/uL (ref 150–400)
RBC: 4.29 MIL/uL (ref 4.22–5.81)
RDW: 13.9 % (ref 11.5–15.5)
WBC: 11.5 10*3/uL — ABNORMAL HIGH (ref 4.0–10.5)

## 2011-11-09 LAB — PRO B NATRIURETIC PEPTIDE: Pro B Natriuretic peptide (BNP): 1379 pg/mL — ABNORMAL HIGH (ref 0–450)

## 2011-11-09 LAB — PROTIME-INR
INR: 2.09 — ABNORMAL HIGH (ref 0.00–1.49)
Prothrombin Time: 23.8 seconds — ABNORMAL HIGH (ref 11.6–15.2)

## 2011-11-09 LAB — CARDIAC PANEL(CRET KIN+CKTOT+MB+TROPI)
CK, MB: 4.1 ng/mL — ABNORMAL HIGH (ref 0.3–4.0)
Relative Index: 3.9 — ABNORMAL HIGH (ref 0.0–2.5)
Relative Index: INVALID (ref 0.0–2.5)
Total CK: 104 U/L (ref 7–232)
Total CK: 86 U/L (ref 7–232)
Troponin I: <0.3 ng/mL (ref <0.30)

## 2011-11-09 MED ORDER — POTASSIUM CHLORIDE 10 MEQ/100ML IV SOLN
10.0000 meq | Freq: Once | INTRAVENOUS | Status: AC
  Administered 2011-11-09: 10 meq via INTRAVENOUS
  Filled 2011-11-09: qty 100

## 2011-11-09 MED ORDER — WARFARIN SODIUM 2.5 MG PO TABS
2.5000 mg | ORAL_TABLET | Freq: Every day | ORAL | Status: DC
  Administered 2011-11-10 – 2011-11-14 (×6): 2.5 mg via ORAL
  Filled 2011-11-09 (×7): qty 1

## 2011-11-09 MED ORDER — VITAMIN D3 25 MCG (1000 UNIT) PO TABS
1000.0000 [IU] | ORAL_TABLET | Freq: Every day | ORAL | Status: DC
  Administered 2011-11-10 – 2011-11-18 (×10): 1000 [IU] via ORAL
  Filled 2011-11-09 (×10): qty 1

## 2011-11-09 MED ORDER — PANTOPRAZOLE SODIUM 40 MG PO TBEC
40.0000 mg | DELAYED_RELEASE_TABLET | Freq: Every day | ORAL | Status: DC
  Administered 2011-11-10 – 2011-11-18 (×10): 40 mg via ORAL
  Filled 2011-11-09 (×10): qty 1

## 2011-11-09 MED ORDER — PIPERACILLIN-TAZOBACTAM 3.375 G IVPB
3.3750 g | Freq: Three times a day (TID) | INTRAVENOUS | Status: DC
  Administered 2011-11-10 – 2011-11-14 (×13): 3.375 g via INTRAVENOUS
  Filled 2011-11-09 (×17): qty 50

## 2011-11-09 MED ORDER — ATENOLOL 50 MG PO TABS
50.0000 mg | ORAL_TABLET | Freq: Every day | ORAL | Status: DC
  Administered 2011-11-10 – 2011-11-18 (×9): 50 mg via ORAL
  Filled 2011-11-09 (×10): qty 1

## 2011-11-09 MED ORDER — ASPIRIN 81 MG PO CHEW
81.0000 mg | CHEWABLE_TABLET | Freq: Every day | ORAL | Status: DC
  Administered 2011-11-10 – 2011-11-18 (×10): 81 mg via ORAL
  Filled 2011-11-09 (×10): qty 1

## 2011-11-09 MED ORDER — ALPRAZOLAM 0.5 MG PO TABS
0.5000 mg | ORAL_TABLET | Freq: Every evening | ORAL | Status: DC | PRN
  Administered 2011-11-10 – 2011-11-17 (×6): 0.5 mg via ORAL
  Filled 2011-11-09 (×6): qty 1

## 2011-11-09 MED ORDER — POTASSIUM CHLORIDE 10 MEQ/100ML IV SOLN
10.0000 meq | INTRAVENOUS | Status: AC
Start: 2011-11-09 — End: 2011-11-10
  Administered 2011-11-10 (×3): 10 meq via INTRAVENOUS
  Filled 2011-11-09 (×3): qty 100

## 2011-11-09 MED ORDER — MECLIZINE HCL 12.5 MG PO TABS
6.2500 mg | ORAL_TABLET | Freq: Three times a day (TID) | ORAL | Status: DC | PRN
  Filled 2011-11-09: qty 0.5

## 2011-11-09 MED ORDER — FUROSEMIDE 10 MG/ML IJ SOLN
40.0000 mg | Freq: Two times a day (BID) | INTRAMUSCULAR | Status: DC
  Administered 2011-11-10: 40 mg via INTRAVENOUS
  Filled 2011-11-09 (×4): qty 4

## 2011-11-09 MED ORDER — NITROGLYCERIN 0.4 MG SL SUBL: 0.4000 mg | SUBLINGUAL_TABLET | SUBLINGUAL | Status: DC | PRN

## 2011-11-09 MED ORDER — ACETAMINOPHEN 500 MG PO TABS
500.0000 mg | ORAL_TABLET | Freq: Every day | ORAL | Status: DC
  Administered 2011-11-10 – 2011-11-17 (×7): 500 mg via ORAL
  Filled 2011-11-09 (×10): qty 1

## 2011-11-09 MED ORDER — ASPIRIN 81 MG PO TABS
81.0000 mg | ORAL_TABLET | ORAL | Status: DC
Start: 2011-11-09 — End: 2011-11-09

## 2011-11-09 MED ORDER — ALLOPURINOL 300 MG PO TABS
300.0000 mg | ORAL_TABLET | Freq: Every day | ORAL | Status: DC
  Administered 2011-11-10 – 2011-11-18 (×9): 300 mg via ORAL
  Filled 2011-11-09 (×10): qty 1

## 2011-11-09 MED ORDER — GABAPENTIN 100 MG PO CAPS
100.0000 mg | ORAL_CAPSULE | Freq: Three times a day (TID) | ORAL | Status: DC
  Administered 2011-11-10 – 2011-11-18 (×26): 100 mg via ORAL
  Filled 2011-11-09 (×28): qty 1

## 2011-11-09 MED ORDER — POTASSIUM CHLORIDE CRYS ER 20 MEQ PO TBCR
20.0000 meq | EXTENDED_RELEASE_TABLET | Freq: Two times a day (BID) | ORAL | Status: DC
  Administered 2011-11-10 – 2011-11-16 (×15): 20 meq via ORAL
  Filled 2011-11-09 (×19): qty 1

## 2011-11-09 NOTE — Progress Notes (Signed)
*  PRELIMINARY RESULTS* BLEV Duplex Performed  No obvious evidence of Acute deep vein thrombosis bilaterally. No evidence of a Baker's Cyst bilaterally.  [name] 11/09/2011, 5:16 PM

## 2011-11-09 NOTE — ED Provider Notes (Signed)
History     CSN: [id number]  Arrival date & time 11/09/11  1300   First MD Initiated Contact with Patient 11/09/11 1322      Chief Complaint  Patient presents with  . Leg Pain  . Leg Swelling  . Shortness of Breath    (Consider location/radiation/quality/duration/timing/severity/associated sxs/prior treatment) HPI Patient presents to the emergency department with progressive weakness over the last few weeks.  He is also stating that he is getting more short of breath and his lower legs and swelling chronically.  He takes Lasix 160 mg a day along with Zaroxolyn for his edema.  Patient denies chest pain, headache, vomiting, nausea, or blurred vision.  He states he has been getting weak and feeling takes a fall.  There is primary care doctor was concerned with the wounds on his legs and prescribed antibiotics.  Patient states that he had increased swelling in his lower extremities that his doctor put him on Zaroxolyn.  Past Medical History  Diagnosis Date  . Hypertension   . CHF (congestive heart failure)   . Arrhythmia     afib  . Neuropathy     Past Surgical History  Procedure Date  . Pacemaker insertion     No family history on file.  History  Substance Use Topics  . Smoking status: Not on file  . Smokeless tobacco: Not on file  . Alcohol Use:       Review of Systems All pertinent positives and negatives reviewed in the history of present illness  Allergies  Amlodipine besylate; Azithromycin; Codeine; Digoxin; Indomethacin; Lorazepam; and Sulfonamide derivatives  Home Medications   Current Outpatient Rx  Name Route Sig Dispense Refill  . ACETAMINOPHEN 500 MG PO TABS Oral Take 500 mg by mouth at bedtime.      . ALLOPURINOL 300 MG PO TABS Oral Take 300 mg by mouth daily.      Marland Kitchen ALPRAZOLAM 0.5 MG PO TABS Oral Take 0.5 mg by mouth at bedtime as needed. For sleep    . AMOXICILLIN-POT CLAVULANATE 875-125 MG PO TABS Oral Take 1 tablet by mouth 2 (two) times daily.       . ASPIRIN 81 MG PO TABS Oral Take 81 mg by mouth every other day.     . ATENOLOL 50 MG PO TABS Oral Take 50 mg by mouth daily.      Marland Kitchen VITAMIN D 1000 UNITS PO TABS Oral Take 1,000 Units by mouth daily.      Marland Kitchen ESOMEPRAZOLE MAGNESIUM 40 MG PO CPDR Oral Take 40 mg by mouth as needed. For acid reflux    . FUROSEMIDE 80 MG PO TABS Oral Take 80 mg by mouth 2 (two) times daily.      Marland Kitchen GABAPENTIN 100 MG PO CAPS Oral Take 100 mg by mouth 3 (three) times daily.      Marland Kitchen MECLIZINE HCL 12.5 MG PO TABS Oral Take 6.25 mg by mouth 3 (three) times daily as needed. For dizziness     . METOLAZONE 2.5 MG PO TABS Oral Take 2.5 mg by mouth as needed. 30 minutes before furosemide ; for swelling    . POTASSIUM CHLORIDE CRYS ER 20 MEQ PO TBCR Oral Take 20 mEq by mouth daily.      . WARFARIN SODIUM 2.5 MG PO TABS Oral Take 2.5 mg by mouth daily at 6 PM.      . NITROGLYCERIN 0.4 MG SL SUBL Sublingual Place 0.4 mg under the tongue every 5 (five) minutes as  needed. For chest pain      BP 128/73  Pulse 79  Temp(Src) 98.5 F (36.9 C) (Oral)  Resp 18  SpO2 96%  Physical Exam  Constitutional: He is oriented to person, place, and time. He appears well-developed and well-nourished. No distress.  HENT:  Head: Normocephalic and atraumatic.  Eyes: Pupils are equal, round, and reactive to light.  Neck: Normal range of motion. Neck supple.  Cardiovascular: Normal rate, regular rhythm and normal heart sounds.  Exam reveals no gallop and no friction rub.   No murmur heard. Pulmonary/Chest: Effort normal and breath sounds normal. No respiratory distress. He has no wheezes. He has no rales.  Abdominal: Soft. He exhibits no distension. There is no tenderness. There is no rebound and no guarding.  Musculoskeletal: He exhibits edema and tenderness.  Neurological: He is alert and oriented to person, place, and time.  Skin: Skin is warm and dry. No rash noted. No erythema.    ED Course  Procedures (including critical care  time)  Labs Reviewed  CBC - Abnormal; Notable for the following:    WBC 11.5 (*)    All other components within normal limits  PRO B NATRIURETIC PEPTIDE - Abnormal; Notable for the following:    Pro B Natriuretic peptide (BNP) 1379.0 (*)    All other components within normal limits  CARDIAC PANEL(CRET KIN+CKTOT+MB+TROPI) - Abnormal; Notable for the following:    CK, MB 4.1 (*)    Relative Index 3.9 (*)    All other components within normal limits  BASIC METABOLIC PANEL   Dg Chest 2 View  11/09/2011  *RADIOLOGY REPORT*  Clinical Data: Bradycardia  CHEST - 2 VIEW  Comparison: 12/31/2010  Findings: Stable T10 compression deformity.  Moderate cardiomegaly. Dual lead left subclavian pacemaker device and leads are unchanged. Low lung volumes.  No consolidation or mass.  No pneumothorax.  IMPRESSION: Cardiomegaly without decompensation.  Chronic change.  Original Report Authenticated By: Donavan Burnet, M.D.      The patient will be admitted to the hospital by the triad hospitalist service.  He has hypokalemia most likely due to Lasix usage.    MDM   MDM Reviewed: previous chart, nursing note and vitals Reviewed previous: labs, ECG and x-ray Interpretation: ECG, labs and x-ray Consults: admitting MD           Carlyle Dolly, PA-C 11/09/11 1715

## 2011-11-09 NOTE — H&P (Signed)
PCP:  Lillia Mountain, MD, MD Chief Complaint: Generalized weakness of unspecified duration.   HPI:  Patient is an 77 year old Caucasian male with history of coronary artery disease status post CABG, chronic stasis of the lower extremity with recurrent ulcers worse on the left leg down the right leg who is being seen at the wound clinic presenting to the emergency room with generalized weakness of unspecified duration and this was said to have gotten progressively worse. Patient claimed that he had been on massive doses of Lasix and since then has been getting progressively weak because of frequent diuresis. He denies any history of chest pain or shortness of breath. He denies any diaphoresis. He denies any fever chills or Rigors. He denied any nausea vomiting. He weakness was said to be pronounced and patient was unable to stand erect. In the ED, he was found to have been very low potassium level. Patient was brought to the emergency room by his son for further evaluation and stabilization.  Review of Systems:  The patient denies anorexia, fever, weight loss,, vision loss, decreased hearing, hoarseness, chest pain, syncope, dyspnea on exertion, peripheral edema++, balance deficits++, hemoptysis, abdominal pain, melena, hematochezia, severe indigestion/heartburn, hematuria, incontinence, genital sores, muscle weakness, suspicious skin lesions, transient blindness, difficulty walking++, depression, unusual weight change, abnormal bleeding, enlarged lymph nodes, angioedema, and breast masses.  Past Medical History:  Past Medical History  Diagnosis Date  . Hypertension   . CHF (congestive heart failure)   . Arrhythmia     afib  . Neuropathy   Coronary artery disease History of non-Hodgkin's lymphoma in remission History of chronic venostasis with recurrent ulcers of the lower extremity  Past Surgical History  Procedure Date  . Pacemaker insertion   Coronary artery disease status  post CABG Left knee surgery Status post Port-A-Cath status post removal  Medications:  Prior to Admission medications   Medication Sig Start Date End Date Taking? Authorizing Provider  acetaminophen (TYLENOL) 500 MG tablet Take 500 mg by mouth at bedtime.     Yes Historical Provider, MD  allopurinol (ZYLOPRIM) 300 MG tablet Take 300 mg by mouth daily.     Yes Historical Provider, MD  ALPRAZolam Prudy Feeler) 0.5 MG tablet Take 0.5 mg by mouth at bedtime as needed. For sleep   Yes Historical Provider, MD  amoxicillin-clavulanate (AUGMENTIN) 875-125 MG per tablet Take 1 tablet by mouth 2 (two) times daily.     Yes Historical Provider, MD  aspirin 81 MG tablet Take 81 mg by mouth every other day.    Yes Historical Provider, MD  atenolol (TENORMIN) 50 MG tablet Take 50 mg by mouth daily.     Yes Historical Provider, MD  cholecalciferol (VITAMIN D) 1000 UNITS tablet Take 1,000 Units by mouth daily.     Yes Historical Provider, MD  esomeprazole (NEXIUM) 40 MG capsule Take 40 mg by mouth as needed. For acid reflux   Yes Historical Provider, MD  furosemide (LASIX) 80 MG tablet Take 80 mg by mouth 2 (two) times daily.     Yes Historical Provider, MD  gabapentin (NEURONTIN) 100 MG capsule Take 100 mg by mouth 3 (three) times daily.     Yes Historical Provider, MD  meclizine (ANTIVERT) 12.5 MG tablet Take 6.25 mg by mouth 3 (three) times daily as needed. For dizziness    Yes Historical Provider, MD  metolazone (ZAROXOLYN) 2.5 MG tablet Take 2.5 mg by mouth as needed. 30 minutes before furosemide ; for swelling   Yes Historical Provider,  MD  potassium chloride SA (K-DUR,KLOR-CON) 20 MEQ tablet Take 20 mEq by mouth daily.     Yes Historical Provider, MD  warfarin (COUMADIN) 2.5 MG tablet Take 2.5 mg by mouth daily at 6 PM.     Yes Historical Provider, MD  nitroGLYCERIN (NITROSTAT) 0.4 MG SL tablet Place 0.4 mg under the tongue every 5 (five) minutes as needed. For chest pain    Historical Provider, MD     Allergies:  Allergies  Allergen Reactions  . Amlodipine Besylate   . Azithromycin   . Codeine   . Digoxin   . Indomethacin   . Lorazepam   . Sulfonamide Derivatives     Social History:   does not have a smoking history on file. He does not have any smokeless tobacco history on file. His alcohol and drug histories not on file.  Family History:  No family history on file.  Physical Exam:  Filed Vitals:   11/09/11 1300 11/09/11 1806 11/09/11 2012  BP: 128/73 132/73 123/71  Pulse: 79 77 76  Temp: 98.5 F (36.9 C)  98.7 F (37.1 C)  TempSrc: Oral  Oral  Resp: 18 20 20   SpO2: 96% 97% 98%      General: Alert and oriented times three, , no acute distress  Eyes: PERRLA, pink conjunctiva, scleral icterus  ENT: Moist oral mucosa, neck supple, no thyromegaly  Lungs: clear to ascultation, no wheeze, no crackles, no use of accessory muscles  Cardiovascular: regular rate and rhythm, no regurgitation, no gallops, no murmurs. No carotid bruits, no JVD  Abdomen: soft, positive BS, non-tender, non-distended, no organomegaly, not an acute abdomen  GU: not examined  Neuro: CN II - XII grossly intact, sensation intact  Musculoskeletal: strength 5/5 all extremities, no clubbing, cyanosis, +2 pedal edema with surrounding areas of erythema was in the left leg than on the right leg.  Skin: no rash, no subcutaneous crepitation, no decubitus  Psych: appropriate patient  ?  Labs on Admission:   [facility] 11/09/11 1505  NA 136  K 2.3*  CL 90*  CO2 37*  GLUCOSE 106*  BUN 24*  CREATININE 1.00  CALCIUM 10.3  MG --  PHOS --    No results found for this basename: AST:2,ALT:2,ALKPHOS:2,BILITOT:2,PROT:2,ALBUMIN:2 in the last 72 hours  No results found for this basename: LIPASE:2,AMYLASE:2 in the last 72 hours   Basename 11/09/11 1505  WBC 11.5*  NEUTROABS --  HGB 14.4  HCT 41.8  MCV 97.4  PLT 212     Basename 11/09/11 1505  CKTOTAL 104  CKMB 4.1*   CKMBINDEX --  TROPONINI <0.30    No results found for this basename: TSH,T4TOTAL,FREET3,T3FREE,THYROIDAB in the last 72 hours  No results found for this basename: VITAMINB12:2,FOLATE:2,FERRITIN:2,TIBC:2,IRON:2,RETICCTPCT:2 in the last 72 hours  Radiological Exams on Admission:  Dg Chest 2 View  11/09/2011  *RADIOLOGY REPORT*  Clinical Data: Bradycardia  CHEST - 2 VIEW  Comparison: 12/31/2010  Findings: Stable T10 compression deformity.  Moderate cardiomegaly. Dual lead left subclavian pacemaker device and leads are unchanged. Low lung volumes.  No consolidation or mass.  No pneumothorax.  IMPRESSION: Cardiomegaly without decompensation.  Chronic change.  Original Report Authenticated By: Donavan Burnet, M.D.   Nm Bone Scan 3 Phase Lower Extremity  10/29/2011  *RADIOLOGY REPORT*  Clinical Data: Left ankle pain,  edema  NUCLEAR MEDICINE THREE PHASE BONE SCAN  Technique:  Radionuclide angiographic images, immediate static blood pool images, and 3-hour delayed static images were obtained after intravenous injection of  radiopharmaceutical.  Radiopharmaceutical: One.  Technetium 99 MDP  Comparison: None.  Findings:  Vascular phase:  There is no significant asymmetric or increased vascular flow to the left or right extremity.  Blood pool there is a focal activity within the tarsal bones on the left and right as well as the distal tibia-fibula. Focal uptake is also present at the first metatarsal phalangeal joint on the left and right.  Delayed phase:  There is focal activity within essentially all the tarsal bones the left and right as well as the distal tibia-fibula on the left and right.  Focal uptake within the first metatarsal phalangeal joint on the left and right.  This pattern matches the blood pool activity.  IMPRESSION:   Pattern most suggestive of moderate severe osteoarthritis of the midfoot and tibiotalar joint.  Cannot exclude a Charcot's type pattern although less favored.  No convincing  evidence of osteomyelitis or reflex sympathetic dystrophy.  No evidence of fracture. Recommend correlation with plain films.  Original Report Authenticated By: Genevive Bi, M.D.    Assessment/Plan  Problems: #1 progressive weakness #2 over diureses #3 hypokalemia #4 chronic venostasis of lower extremity with cellulitis  Impression: #1 progressive weakness most likely secondary to over diurese and hypokalemia #2 chronic venostasis of the lower extremity with surrounding cellulitis  #3 history of coronary artery disease status post CABG status post pacemaker #4 chronic congestive heart failure, diastolic dysfunction #5 hypertension #6 atrial fibrillation #7 history of non-Hodgkin's lymphoma status post chemotherapy on remission  Plan: #1 admit patient to telemetry #2 replete potassium #3 gently diuresed with Lasix #4 start treatment for cellulitis with IV Zosyn #5 restart most of the home meds #6 other labs-CBC ,CMP and magnesium, cardiac enzymes. #7 consult wound care nurse in am #8 hydrotherapy He'll be evaluated on daily basis Talmage Nap [phone]

## 2011-11-09 NOTE — ED Notes (Signed)
PTAR-swelling in bilateral legs x 1 week associated with sob. Pt reports nausea since yesterday with constipation.

## 2011-11-09 NOTE — ED Provider Notes (Signed)
Medical screening examination/treatment/procedure(s) were performed by non-physician practitioner and as supervising physician I was immediately available for consultation/collaboration.  Patient seen and examined. Left lower extremity swelling noted. Patient to have Doppler ultrasound to rule out DVT awaiting labs  Toy Baker, MD 11/09/11 1445

## 2011-11-10 LAB — COMPREHENSIVE METABOLIC PANEL
ALT: 11 U/L (ref 0–53)
AST: 20 U/L (ref 0–37)
Albumin: 2.7 g/dL — ABNORMAL LOW (ref 3.5–5.2)
Alkaline Phosphatase: 53 U/L (ref 39–117)
BUN: 19 mg/dL (ref 6–23)
Potassium: 2.5 mEq/L — CL (ref 3.5–5.1)
Sodium: 137 mEq/L (ref 135–145)
Total Protein: 6.3 g/dL (ref 6.0–8.3)

## 2011-11-10 LAB — CBC
HCT: 39.6 % (ref 39.0–52.0)
MCH: 33.1 pg (ref 26.0–34.0)
MCV: 98.5 fL (ref 78.0–100.0)
RDW: 13.9 % (ref 11.5–15.5)
WBC: 11.6 10*3/uL — ABNORMAL HIGH (ref 4.0–10.5)

## 2011-11-10 LAB — CARDIAC PANEL(CRET KIN+CKTOT+MB+TROPI)
Relative Index: INVALID (ref 0.0–2.5)
Relative Index: INVALID (ref 0.0–2.5)
Total CK: 70 U/L (ref 7–232)

## 2011-11-10 MED ORDER — HYDROMORPHONE HCL PF 1 MG/ML IJ SOLN
0.5000 mg | INTRAMUSCULAR | Status: DC | PRN
  Administered 2011-11-11: 0.5 mg via INTRAVENOUS
  Filled 2011-11-10: qty 1

## 2011-11-10 MED ORDER — POTASSIUM CHLORIDE 10 MEQ/100ML IV SOLN
10.0000 meq | INTRAVENOUS | Status: AC
  Administered 2011-11-10 (×3): 10 meq via INTRAVENOUS
  Filled 2011-11-10 (×3): qty 100

## 2011-11-10 MED ORDER — OXYCODONE-ACETAMINOPHEN 5-325 MG PO TABS
2.0000 | ORAL_TABLET | ORAL | Status: DC | PRN
  Administered 2011-11-10: 1 via ORAL
  Filled 2011-11-10: qty 2

## 2011-11-10 NOTE — Progress Notes (Signed)
Physical Therapy Wound Treatment Patient Details  Name: BRANDOM KERWIN MRN: [id number] Date of Birth: 1923/01/27  Today's Date: 11/10/2011  Noted new order for Hydrotherapy, however WOC RN has not seen pt yet and no clarified orders for type dressings or which wound(s) Hydro is to treat and which are for nursing dressing changes only.  Please clarify orders and will follow-up tomorrow.    Sunny Schlein, [place] [phone] 11/10/2011, 1:56 PM

## 2011-11-10 NOTE — Progress Notes (Signed)
Patient ID: [name], male   DOB: 06-18-1923, 78 y.o.   MRN: [id number] Subjective: Patient seen. Comment about pain left lower leg.  Objective: Weight change:   Intake/Output Summary (Last 24 hours) at 11/10/11 1609 Last data filed at 11/10/11 1300  Gross per 24 hour  Intake   1490 ml  Output    401 ml  Net   1089 ml   BP 121/73  Pulse 75  Temp(Src) 98.2 F (36.8 C) (Oral)  Resp 20  Ht 5\' 4"  (1.626 m)  Wt 93.7 kg (206 lb 9.1 oz)  BMI 35.46 kg/m2  SpO2 98% Physical Exam: General appearance: alert, cooperative and no distress Head: Normocephalic, without obvious abnormality, atraumatic Neck: no adenopathy, no carotid bruit, no JVD, supple, symmetrical, trachea midline and thyroid not enlarged, symmetric, no tenderness/mass/nodules Lungs: clear to auscultation bilaterally Heart: regular rate and rhythm, S1, S2 normal, no murmur, click, rub or gallop Abdomen: soft, non-tender; bowel sounds normal; no masses,  no organomegaly Extremities: Pedal edema Skin: Ulcer left lower extremity.  Lab Results: Results for orders placed during the hospital encounter of 11/09/11 (from the past 48 hour(s))  CBC     Status: Abnormal   Collection Time   11/09/11  3:05 PM      Component Value Range Comment   WBC 11.5 (*) 4.0 - 10.5 (K/uL)    RBC 4.29  4.22 - 5.81 (MIL/uL)    Hemoglobin 14.4  13.0 - 17.0 (g/dL)    HCT 40.9  81.1 - 91.4 (%)    MCV 97.4  78.0 - 100.0 (fL)    MCH 33.6  26.0 - 34.0 (pg)    MCHC 34.4  30.0 - 36.0 (g/dL)    RDW 78.2  95.6 - 21.3 (%)    Platelets 212  150 - 400 (K/uL)   BASIC METABOLIC PANEL     Status: Abnormal   Collection Time   11/09/11  3:05 PM      Component Value Range Comment   Sodium 136  135 - 145 (mEq/L)    Potassium 2.3 (*) 3.5 - 5.1 (mEq/L)    Chloride 90 (*) 96 - 112 (mEq/L)    CO2 37 (*) 19 - 32 (mEq/L)    Glucose, Bld 106 (*) 70 - 99 (mg/dL)    BUN 24 (*) 6 - 23 (mg/dL)    Creatinine, Ser 0.86  0.50 - 1.35 (mg/dL)    Calcium 57.8  8.4  - 10.5 (mg/dL)    GFR calc non Af Amer 66 (*) >90 (mL/min)    GFR calc Af Amer 76 (*) >90 (mL/min)   CARDIAC PANEL(CRET KIN+CKTOT+MB+TROPI)     Status: Abnormal   Collection Time   11/09/11  3:05 PM      Component Value Range Comment   Total CK 104  7 - 232 (U/L)    CK, MB 4.1 (*) 0.3 - 4.0 (ng/mL)    Troponin I <0.30  <0.30 (ng/mL)    Relative Index 3.9 (*) 0.0 - 2.5    PRO B NATRIURETIC PEPTIDE     Status: Abnormal   Collection Time   11/09/11  3:15 PM      Component Value Range Comment   Pro B Natriuretic peptide (BNP) 1379.0 (*) 0 - 450 (pg/mL)   PROTIME-INR     Status: Abnormal   Collection Time   11/09/11  5:19 PM      Component Value Range Comment   Prothrombin Time 23.8 (*) 11.6 - 15.2 (  seconds)    INR 2.09 (*) 0.00 - 1.49    MAGNESIUM     Status: Normal   Collection Time   11/09/11 10:03 PM      Component Value Range Comment   Magnesium 1.9  1.5 - 2.5 (mg/dL)   CBC     Status: Abnormal   Collection Time   11/09/11 10:03 PM      Component Value Range Comment   WBC 10.0  4.0 - 10.5 (K/uL)    RBC 4.08 (*) 4.22 - 5.81 (MIL/uL)    Hemoglobin 13.7  13.0 - 17.0 (g/dL)    HCT 81.1  91.4 - 78.2 (%)    MCV 98.0  78.0 - 100.0 (fL)    MCH 33.6  26.0 - 34.0 (pg)    MCHC 34.3  30.0 - 36.0 (g/dL)    RDW 95.6  21.3 - 08.6 (%)    Platelets 222  150 - 400 (K/uL)   DIFFERENTIAL     Status: Normal   Collection Time   11/09/11 10:03 PM      Component Value Range Comment   Neutrophils Relative 59  43 - 77 (%)    Neutro Abs 5.8  1.7 - 7.7 (K/uL)    Lymphocytes Relative 29  12 - 46 (%)    Lymphs Abs 2.9  0.7 - 4.0 (K/uL)    Monocytes Relative 10  3 - 12 (%)    Monocytes Absolute 1.0  0.1 - 1.0 (K/uL)    Eosinophils Relative 2  0 - 5 (%)    Eosinophils Absolute 0.2  0.0 - 0.7 (K/uL)    Basophils Relative 0  0 - 1 (%)    Basophils Absolute 0.0  0.0 - 0.1 (K/uL)   CARDIAC PANEL(CRET KIN+CKTOT+MB+TROPI)     Status: Normal   Collection Time   11/09/11 10:03 PM      Component  Value Range Comment   Total CK 86  7 - 232 (U/L)    CK, MB 3.2  0.3 - 4.0 (ng/mL)    Troponin I <0.30  <0.30 (ng/mL)    Relative Index RELATIVE INDEX IS INVALID  0.0 - 2.5    COMPREHENSIVE METABOLIC PANEL     Status: Abnormal   Collection Time   11/10/11  5:55 AM      Component Value Range Comment   Sodium 137  135 - 145 (mEq/L)    Potassium 2.5 (*) 3.5 - 5.1 (mEq/L)    Chloride 94 (*) 96 - 112 (mEq/L)    CO2 33 (*) 19 - 32 (mEq/L)    Glucose, Bld 114 (*) 70 - 99 (mg/dL)    BUN 19  6 - 23 (mg/dL)    Creatinine, Ser 5.78  0.50 - 1.35 (mg/dL)    Calcium 9.7  8.4 - 10.5 (mg/dL)    Total Protein 6.3  6.0 - 8.3 (g/dL)    Albumin 2.7 (*) 3.5 - 5.2 (g/dL)    AST 20  0 - 37 (U/L)    ALT 11  0 - 53 (U/L)    Alkaline Phosphatase 53  39 - 117 (U/L)    Total Bilirubin 0.9  0.3 - 1.2 (mg/dL)    GFR calc non Af Amer 74 (*) >90 (mL/min)    GFR calc Af Amer 86 (*) >90 (mL/min)   CBC     Status: Abnormal   Collection Time   11/10/11  5:55 AM      Component Value Range Comment  WBC 11.6 (*) 4.0 - 10.5 (K/uL)    RBC 4.02 (*) 4.22 - 5.81 (MIL/uL)    Hemoglobin 13.3  13.0 - 17.0 (g/dL)    HCT 16.1  09.6 - 04.5 (%)    MCV 98.5  78.0 - 100.0 (fL)    MCH 33.1  26.0 - 34.0 (pg)    MCHC 33.6  30.0 - 36.0 (g/dL)    RDW 40.9  81.1 - 91.4 (%)    Platelets 220  150 - 400 (K/uL)   PROTIME-INR     Status: Abnormal   Collection Time   11/10/11  5:55 AM      Component Value Range Comment   Prothrombin Time 23.8 (*) 11.6 - 15.2 (seconds)    INR 2.09 (*) 0.00 - 1.49    CARDIAC PANEL(CRET KIN+CKTOT+MB+TROPI)     Status: Normal   Collection Time   11/10/11  5:55 AM      Component Value Range Comment   Total CK 70  7 - 232 (U/L)    CK, MB 2.5  0.3 - 4.0 (ng/mL)    Troponin I <0.30  <0.30 (ng/mL)    Relative Index RELATIVE INDEX IS INVALID  0.0 - 2.5    CARDIAC PANEL(CRET KIN+CKTOT+MB+TROPI)     Status: Normal   Collection Time   11/10/11  2:09 PM      Component Value Range Comment   Total CK 86  7 - 232  (U/L)    CK, MB 2.8  0.3 - 4.0 (ng/mL)    Troponin I <0.30  <0.30 (ng/mL)    Relative Index RELATIVE INDEX IS INVALID  0.0 - 2.5      Micro Results: No results found for this or any previous visit (from the past 240 hour(s)).  Studies/Results: Dg Chest 2 View  11/09/2011  *RADIOLOGY REPORT*  Clinical Data: Bradycardia  CHEST - 2 VIEW  Comparison: 12/31/2010  Findings: Stable T10 compression deformity.  Moderate cardiomegaly. Dual lead left subclavian pacemaker device and leads are unchanged. Low lung volumes.  No consolidation or mass.  No pneumothorax.  IMPRESSION: Cardiomegaly without decompensation.  Chronic change.  Original Report Authenticated By: Donavan Burnet, M.D.   Nm Bone Scan 3 Phase Lower Extremity  10/29/2011  *RADIOLOGY REPORT*  Clinical Data: Left ankle pain,  edema  NUCLEAR MEDICINE THREE PHASE BONE SCAN  Technique:  Radionuclide angiographic images, immediate static blood pool images, and 3-hour delayed static images were obtained after intravenous injection of radiopharmaceutical.  Radiopharmaceutical: One.  Technetium 99 MDP  Comparison: None.  Findings:  Vascular phase:  There is no significant asymmetric or increased vascular flow to the left or right extremity.  Blood pool there is a focal activity within the tarsal bones on the left and right as well as the distal tibia-fibula. Focal uptake is also present at the first metatarsal phalangeal joint on the left and right.  Delayed phase:  There is focal activity within essentially all the tarsal bones the left and right as well as the distal tibia-fibula on the left and right.  Focal uptake within the first metatarsal phalangeal joint on the left and right.  This pattern matches the blood pool activity.  IMPRESSION:   Pattern most suggestive of moderate severe osteoarthritis of the midfoot and tibiotalar joint.  Cannot exclude a Charcot's type pattern although less favored.  No convincing evidence of osteomyelitis or reflex  sympathetic dystrophy.  No evidence of fracture. Recommend correlation with plain films.  Original Report Authenticated By:  Genevive Bi, M.D.   Medications: Scheduled Meds:   . acetaminophen  500 mg Oral QHS  . allopurinol  300 mg Oral Daily  . aspirin  81 mg Oral Daily  . atenolol  50 mg Oral Daily  . cholecalciferol  1,000 Units Oral Daily  . furosemide  40 mg Intravenous BID  . gabapentin  100 mg Oral TID  . pantoprazole  40 mg Oral Daily  . piperacillin-tazobactam (ZOSYN)  IV  3.375 g Intravenous Q8H  . potassium chloride  10 mEq Intravenous Once  . potassium chloride  10 mEq Intravenous Q1 Hr x 3  . potassium chloride  10 mEq Intravenous Q1 Hr x 3  . potassium chloride  20 mEq Oral BID  . warfarin  2.5 mg Oral q1800  . DISCONTD: aspirin  81 mg Oral QODAY   Continuous Infusions:  PRN Meds:.ALPRAZolam, meclizine, nitroGLYCERIN, oxyCODONE-acetaminophen  Assessment/Plan: #1 generalized weakness-secondary to hypokalemia from overdiuresis. Plan is to continue potassium replacement and to cut down the dose of Lasix. #2 hypokalemia-potassium repletion #3 chronic venostasis with cellulitis left lower leg-we continue IV Zosyn #4 history of coronary artery disease status post CABG status post pacemaker-continue diuresis #5 atrial fibrillation-will continued anticoagulation with Coumadin. #6 history of congestive heart failure chronic diastolic dysfunction-continue diuresis Await wound care nurse evaluation of the wound and also hydrotherapy.   LOS: 1 day   Bexton Haak 11/10/2011, 4:09 PM

## 2011-11-10 NOTE — ED Provider Notes (Signed)
Medical screening examination/treatment/procedure(s) were conducted as a shared visit with non-physician practitioner(s) and myself.  I personally evaluated the patient during the encounter  Clemente Dewey T Natosha Bou, MD 11/10/11 1718 

## 2011-11-10 NOTE — Progress Notes (Signed)
0820 placed a call to Dr. Beverly Gust  Re: repeat K level  And spoken to Md  With orders

## 2011-11-11 ENCOUNTER — Encounter (HOSPITAL_BASED_OUTPATIENT_CLINIC_OR_DEPARTMENT_OTHER): Payer: Medicare Other

## 2011-11-11 LAB — COMPREHENSIVE METABOLIC PANEL
Alkaline Phosphatase: 50 U/L (ref 39–117)
BUN: 18 mg/dL (ref 6–23)
Chloride: 96 mEq/L (ref 96–112)
GFR calc Af Amer: 76 mL/min — ABNORMAL LOW (ref >90)
GFR calc non Af Amer: 65 mL/min — ABNORMAL LOW (ref >90)
Glucose, Bld: 114 mg/dL — ABNORMAL HIGH (ref 70–99)
Potassium: 2.6 mEq/L — CL (ref 3.5–5.1)
Total Bilirubin: 0.7 mg/dL (ref 0.3–1.2)

## 2011-11-11 LAB — CBC
HCT: 37.9 % — ABNORMAL LOW (ref 39.0–52.0)
Hemoglobin: 12.6 g/dL — ABNORMAL LOW (ref 13.0–17.0)
MCHC: 33.2 g/dL (ref 30.0–36.0)
WBC: 9.4 10*3/uL (ref 4.0–10.5)

## 2011-11-11 MED ORDER — BISACODYL 5 MG PO TBEC: 5.0000 mg | DELAYED_RELEASE_TABLET | Freq: Every day | ORAL | Status: DC | PRN

## 2011-11-11 MED ORDER — POLYETHYLENE GLYCOL 3350 17 G PO PACK
17.0000 g | PACK | Freq: Every day | ORAL | Status: DC
  Administered 2011-11-11 – 2011-11-16 (×5): 17 g via ORAL
  Filled 2011-11-11 (×8): qty 1

## 2011-11-11 MED ORDER — POTASSIUM CHLORIDE 10 MEQ/100ML IV SOLN
10.0000 meq | INTRAVENOUS | Status: AC
  Administered 2011-11-11 (×3): 10 meq via INTRAVENOUS
  Filled 2011-11-11 (×3): qty 100

## 2011-11-11 MED ORDER — TRAMADOL HCL 50 MG PO TABS
50.0000 mg | ORAL_TABLET | Freq: Four times a day (QID) | ORAL | Status: DC | PRN
  Administered 2011-11-11 – 2011-11-18 (×10): 50 mg via ORAL
  Filled 2011-11-11 (×10): qty 1

## 2011-11-11 MED ORDER — FUROSEMIDE 10 MG/ML IJ SOLN
40.0000 mg | Freq: Every day | INTRAMUSCULAR | Status: DC
  Administered 2011-11-11 – 2011-11-12 (×2): 40 mg via INTRAVENOUS
  Filled 2011-11-11 (×2): qty 4

## 2011-11-11 NOTE — Plan of Care (Signed)
Problem: Phase II Progression Outcomes Goal: Other Phase II Outcomes/Goals Outcome: Progressing Wound care consult to see the pt

## 2011-11-11 NOTE — Progress Notes (Signed)
Patient ID: [name], male   DOB: May 24, 1923, 78 y.o.   MRN: [id number] Patient ID: [name], male   DOB: 06-15-1923, 78 y.o.   MRN: [id number] Subjective: Patient seen. Patient was evaluated by the wound care nurse today and had firm wrapping of the left leg. Patient however is slightly sleepy today questionable secondary to dilaudid.  Objective: Weight change: -0.1 kg (-3.5 oz)  Intake/Output Summary (Last 24 hours) at 11/11/11 1744 Last data filed at 11/11/11 1209  Gross per 24 hour  Intake   1842 ml  Output    900 ml  Net    942 ml   BP 136/80  Pulse 80  Temp(Src) 98.9 F (37.2 C) (Oral)  Resp 18  Ht 5\' 4"  (1.626 m)  Wt 93.6 kg (206 lb 5.6 oz)  BMI 35.42 kg/m2  SpO2 99% Physical Exam: General appearance: alert, cooperative and no distress Head: Normocephalic, without obvious abnormality, atraumatic Neck: no adenopathy, no carotid bruit, no JVD, supple, symmetrical, trachea midline and thyroid not enlarged, symmetric, no tenderness/mass/nodules Lungs: clear to auscultation bilaterally Heart: regular rate and rhythm, S1, S2 normal, no murmur, click, rub or gallop Abdomen: soft, non-tender; bowel sounds normal; no masses,  no organomegaly Extremities: Pedal edema resolving Skin: Ulcer left lower extremity status post firm wrapping with bandage  Lab Results: Results for orders placed during the hospital encounter of 11/09/11 (from the past 48 hour(s))  MAGNESIUM     Status: Normal   Collection Time   11/09/11 10:03 PM      Component Value Range Comment   Magnesium 1.9  1.5 - 2.5 (mg/dL)   CBC     Status: Abnormal   Collection Time   11/09/11 10:03 PM      Component Value Range Comment   WBC 10.0  4.0 - 10.5 (K/uL)    RBC 4.08 (*) 4.22 - 5.81 (MIL/uL)    Hemoglobin 13.7  13.0 - 17.0 (g/dL)    HCT 78.2  95.6 - 21.3 (%)    MCV 98.0  78.0 - 100.0 (fL)    MCH 33.6  26.0 - 34.0 (pg)    MCHC 34.3  30.0 - 36.0 (g/dL)    RDW 08.6  57.8 - 46.9 (%)    Platelets 222   150 - 400 (K/uL)   DIFFERENTIAL     Status: Normal   Collection Time   11/09/11 10:03 PM      Component Value Range Comment   Neutrophils Relative 59  43 - 77 (%)    Neutro Abs 5.8  1.7 - 7.7 (K/uL)    Lymphocytes Relative 29  12 - 46 (%)    Lymphs Abs 2.9  0.7 - 4.0 (K/uL)    Monocytes Relative 10  3 - 12 (%)    Monocytes Absolute 1.0  0.1 - 1.0 (K/uL)    Eosinophils Relative 2  0 - 5 (%)    Eosinophils Absolute 0.2  0.0 - 0.7 (K/uL)    Basophils Relative 0  0 - 1 (%)    Basophils Absolute 0.0  0.0 - 0.1 (K/uL)   CARDIAC PANEL(CRET KIN+CKTOT+MB+TROPI)     Status: Normal   Collection Time   11/09/11 10:03 PM      Component Value Range Comment   Total CK 86  7 - 232 (U/L)    CK, MB 3.2  0.3 - 4.0 (ng/mL)    Troponin I <0.30  <0.30 (ng/mL)    Relative Index RELATIVE INDEX IS  INVALID  0.0 - 2.5    COMPREHENSIVE METABOLIC PANEL     Status: Abnormal   Collection Time   11/10/11  5:55 AM      Component Value Range Comment   Sodium 137  135 - 145 (mEq/L)    Potassium 2.5 (*) 3.5 - 5.1 (mEq/L)    Chloride 94 (*) 96 - 112 (mEq/L)    CO2 33 (*) 19 - 32 (mEq/L)    Glucose, Bld 114 (*) 70 - 99 (mg/dL)    BUN 19  6 - 23 (mg/dL)    Creatinine, Ser 1.61  0.50 - 1.35 (mg/dL)    Calcium 9.7  8.4 - 10.5 (mg/dL)    Total Protein 6.3  6.0 - 8.3 (g/dL)    Albumin 2.7 (*) 3.5 - 5.2 (g/dL)    AST 20  0 - 37 (U/L)    ALT 11  0 - 53 (U/L)    Alkaline Phosphatase 53  39 - 117 (U/L)    Total Bilirubin 0.9  0.3 - 1.2 (mg/dL)    GFR calc non Af Amer 74 (*) >90 (mL/min)    GFR calc Af Amer 86 (*) >90 (mL/min)   CBC     Status: Abnormal   Collection Time   11/10/11  5:55 AM      Component Value Range Comment   WBC 11.6 (*) 4.0 - 10.5 (K/uL)    RBC 4.02 (*) 4.22 - 5.81 (MIL/uL)    Hemoglobin 13.3  13.0 - 17.0 (g/dL)    HCT 09.6  04.5 - 40.9 (%)    MCV 98.5  78.0 - 100.0 (fL)    MCH 33.1  26.0 - 34.0 (pg)    MCHC 33.6  30.0 - 36.0 (g/dL)    RDW 81.1  91.4 - 78.2 (%)    Platelets 220  150 - 400  (K/uL)   PROTIME-INR     Status: Abnormal   Collection Time   11/10/11  5:55 AM      Component Value Range Comment   Prothrombin Time 23.8 (*) 11.6 - 15.2 (seconds)    INR 2.09 (*) 0.00 - 1.49    CARDIAC PANEL(CRET KIN+CKTOT+MB+TROPI)     Status: Normal   Collection Time   11/10/11  5:55 AM      Component Value Range Comment   Total CK 70  7 - 232 (U/L)    CK, MB 2.5  0.3 - 4.0 (ng/mL)    Troponin I <0.30  <0.30 (ng/mL)    Relative Index RELATIVE INDEX IS INVALID  0.0 - 2.5    CARDIAC PANEL(CRET KIN+CKTOT+MB+TROPI)     Status: Normal   Collection Time   11/10/11  2:09 PM      Component Value Range Comment   Total CK 86  7 - 232 (U/L)    CK, MB 2.8  0.3 - 4.0 (ng/mL)    Troponin I <0.30  <0.30 (ng/mL)    Relative Index RELATIVE INDEX IS INVALID  0.0 - 2.5    CBC     Status: Abnormal   Collection Time   11/11/11  5:10 AM      Component Value Range Comment   WBC 9.4  4.0 - 10.5 (K/uL)    RBC 3.83 (*) 4.22 - 5.81 (MIL/uL)    Hemoglobin 12.6 (*) 13.0 - 17.0 (g/dL)    HCT 95.6 (*) 21.3 - 52.0 (%)    MCV 99.0  78.0 - 100.0 (fL)    MCH 32.9  26.0 - 34.0 (pg)    MCHC 33.2  30.0 - 36.0 (g/dL)    RDW 56.2  13.0 - 86.5 (%)    Platelets 208  150 - 400 (K/uL)   COMPREHENSIVE METABOLIC PANEL     Status: Abnormal   Collection Time   11/11/11  5:10 AM      Component Value Range Comment   Sodium 136  135 - 145 (mEq/L)    Potassium 2.6 (*) 3.5 - 5.1 (mEq/L)    Chloride 96  96 - 112 (mEq/L)    CO2 33 (*) 19 - 32 (mEq/L)    Glucose, Bld 114 (*) 70 - 99 (mg/dL)    BUN 18  6 - 23 (mg/dL)    Creatinine, Ser 7.84  0.50 - 1.35 (mg/dL)    Calcium 9.4  8.4 - 10.5 (mg/dL)    Total Protein 5.8 (*) 6.0 - 8.3 (g/dL)    Albumin 2.4 (*) 3.5 - 5.2 (g/dL)    AST 17  0 - 37 (U/L)    ALT 10  0 - 53 (U/L)    Alkaline Phosphatase 50  39 - 117 (U/L)    Total Bilirubin 0.7  0.3 - 1.2 (mg/dL)    GFR calc non Af Amer 65 (*) >90 (mL/min)    GFR calc Af Amer 76 (*) >90 (mL/min)     Micro Results: No results found  for this or any previous visit (from the past 240 hour(s)).  Studies/Results: Dg Chest 2 View  11/09/2011  *RADIOLOGY REPORT*  Clinical Data: Bradycardia  CHEST - 2 VIEW  Comparison: 12/31/2010  Findings: Stable T10 compression deformity.  Moderate cardiomegaly. Dual lead left subclavian pacemaker device and leads are unchanged. Low lung volumes.  No consolidation or mass.  No pneumothorax.  IMPRESSION: Cardiomegaly without decompensation.  Chronic change.  Original Report Authenticated By: Donavan Burnet, M.D.   Nm Bone Scan 3 Phase Lower Extremity  10/29/2011  *RADIOLOGY REPORT*  Clinical Data: Left ankle pain,  edema  NUCLEAR MEDICINE THREE PHASE BONE SCAN  Technique:  Radionuclide angiographic images, immediate static blood pool images, and 3-hour delayed static images were obtained after intravenous injection of radiopharmaceutical.  Radiopharmaceutical: One.  Technetium 99 MDP  Comparison: None.  Findings:  Vascular phase:  There is no significant asymmetric or increased vascular flow to the left or right extremity.  Blood pool there is a focal activity within the tarsal bones on the left and right as well as the distal tibia-fibula. Focal uptake is also present at the first metatarsal phalangeal joint on the left and right.  Delayed phase:  There is focal activity within essentially all the tarsal bones the left and right as well as the distal tibia-fibula on the left and right.  Focal uptake within the first metatarsal phalangeal joint on the left and right.  This pattern matches the blood pool activity.  IMPRESSION:   Pattern most suggestive of moderate severe osteoarthritis of the midfoot and tibiotalar joint.  Cannot exclude a Charcot's type pattern although less favored.  No convincing evidence of osteomyelitis or reflex sympathetic dystrophy.  No evidence of fracture. Recommend correlation with plain films.  Original Report Authenticated By: Genevive Bi, M.D.   Medications: Scheduled  Meds:    . acetaminophen  500 mg Oral QHS  . allopurinol  300 mg Oral Daily  . aspirin  81 mg Oral Daily  . atenolol  50 mg Oral Daily  . cholecalciferol  1,000 Units Oral Daily  .  furosemide  40 mg Intravenous Daily  . gabapentin  100 mg Oral TID  . pantoprazole  40 mg Oral Daily  . piperacillin-tazobactam (ZOSYN)  IV  3.375 g Intravenous Q8H  . polyethylene glycol  17 g Oral Daily  . potassium chloride  10 mEq Intravenous Q1 Hr x 3  . potassium chloride  20 mEq Oral BID  . warfarin  2.5 mg Oral q1800  . DISCONTD: furosemide  40 mg Intravenous BID   Continuous Infusions:  PRN Meds:.ALPRAZolam, bisacodyl, meclizine, nitroGLYCERIN, oxyCODONE-acetaminophen, traMADol, DISCONTD:  HYDROmorphone (DILAUDID) injection  Assessment/Plan: #1 generalized weakness-secondary to hypokalemia from overdiuresis. Plan is to continue potassium replacement with adjustment of Lasix dose. #2 hypokalemia-will continue potassium repletion #3 chronic venostasis with cellulitis left lower leg status post bandaging-will continue IV Zosyn #4 history of coronary artery disease status post CABG status post pacemaker-continue diuresis #5 atrial fibrillation-will continue anticoagulation with Coumadin. #6 history of congestive heart ,chronic diastolic dysfunction-continue diuresis. Discussed patient's case in detail since patient son and daughter in law and both verbalize understanding.   LOS: 2 days   Andreah Goheen 11/11/2011, 5:44 PM

## 2011-11-11 NOTE — Progress Notes (Signed)
Physical Therapy Wound Treatment Patient Details  Name: [name] MRN: [id number] Date of Birth: May 11, 1923  Today's Date: 11/11/2011  Noted order to discontinue Hydrotherapy.  Please write new order if pt becomes appropriate for [facility].  Thanks.    Sunny Schlein, [place] [phone] 11/11/2011, 11:55 AM

## 2011-11-11 NOTE — Consult Note (Addendum)
WOC consult Note Reason for Consult: req to eval for wound care for LLE ulceration.  Pt gave my long history of ulcerations of this leg.  He has been treated by primary care for cellulitis and by the wound care center for venous stasis ulceration.  Has most recently had multilayer compression wrap for this extremity to treat ulceration and does report that he wears long term compression hose, however when this ulceration appeared several weeks ago he stopped wearing them due to pain.  He reports that he came into hospital with multilayer compression wrap for LLE but was removed for diagnostic testing.  Pt has palpable pulses bilaterally and noted hemosiderin staining of bilateral LE.  2+ pittting edema noted in LLE worse in left than right.  Ulceration of both medial malleolar region and small ulceration of lateral malleolar region of LLE  Wound type:venous stasis ulceration LLE  Measurement:  L medial : 3.5cm x 3.0cm x 0.2cm  L lateral:  1.0cm x 1.0cm x 0.2 cm  Wound ZOX:WRUEAV filled consistent with venous ulceration   Drainage (amount, consistency, odor) yellow, moderate amount, no odor noted at time of assessment  Periwound: intact with hemosiderin staining  Dressing procedure/placement/frequency:  Will place pt back in multilayer compression wrap, to be changed per WOC nurse 2x wk until dc back to follow up with WCC. Will use foam dressing to on ulcerations to absorb excess exudate at wound site under compression.  WOC will follow for 2x wk Profore wrapping and topical wound care.  Will need to have follow up appt. Scheduled with WCC at dc please.  Thanks  [name] [employer], CWOCN [phone])   11/12/11-wrapped LLE with Profore, mutilayer compression wrap. Pt tolerated without problems, able to wiggle toes and flex ankle slightly. Notified RN at bedside wrap applied.

## 2011-11-11 NOTE — Progress Notes (Signed)
11/11/2011 [name] SPARKS Case Management Note [phone]  Utilization review completed.  

## 2011-11-12 ENCOUNTER — Encounter: Payer: Medicare Other | Admitting: *Deleted

## 2011-11-12 LAB — COMPREHENSIVE METABOLIC PANEL
ALT: 12 U/L (ref 0–53)
AST: 23 U/L (ref 0–37)
Albumin: 2.6 g/dL — ABNORMAL LOW (ref 3.5–5.2)
CO2: 32 mEq/L (ref 19–32)
Calcium: 9.6 mg/dL (ref 8.4–10.5)
Creatinine, Ser: 1.15 mg/dL (ref 0.50–1.35)
GFR calc non Af Amer: 56 mL/min — ABNORMAL LOW (ref >90)
Sodium: 138 mEq/L (ref 135–145)
Total Protein: 6.9 g/dL (ref 6.0–8.3)

## 2011-11-12 LAB — PROTIME-INR: Prothrombin Time: 25.5 seconds — ABNORMAL HIGH (ref 11.6–15.2)

## 2011-11-12 LAB — CBC
MCH: 33.7 pg (ref 26.0–34.0)
MCHC: 33.4 g/dL (ref 30.0–36.0)
MCV: 101 fL — ABNORMAL HIGH (ref 78.0–100.0)
Platelets: 239 10*3/uL (ref 150–400)
RDW: 14 % (ref 11.5–15.5)

## 2011-11-12 MED ORDER — PREDNISONE 50 MG PO TABS
50.0000 mg | ORAL_TABLET | Freq: Once | ORAL | Status: AC
  Administered 2011-11-12: 50 mg via ORAL
  Filled 2011-11-12: qty 1

## 2011-11-12 MED ORDER — POTASSIUM CHLORIDE CRYS ER 20 MEQ PO TBCR
40.0000 meq | EXTENDED_RELEASE_TABLET | Freq: Once | ORAL | Status: AC
  Administered 2011-11-12: 40 meq via ORAL
  Filled 2011-11-12: qty 2

## 2011-11-12 MED ORDER — FUROSEMIDE 20 MG PO TABS
60.0000 mg | ORAL_TABLET | Freq: Every day | ORAL | Status: DC
  Filled 2011-11-12: qty 1

## 2011-11-12 NOTE — Progress Notes (Signed)
Pt evaluated for long term disease management services with [facility] Management program as a benefit of [employer]. While discussing d/c plans with the pt, his family stepped outside of his room to discuss his need for short term rehab post d/c. Next month will mark the 1 year anniversary of his wife's death at [facility]. He does not want to d/c to a SNF and is open to doing inpatient rehab at [facility] if he is able to.  Future home needs such as Assisted living discussed with family, as the pt lives alone and does not have 24 hour care,but the family can check on him daily. He uses a cane and RW to ambulate and drives very short distances. SNFs discussed with the family if he is unable to meet criteria for inpatient rehab. Will continue to follow while inpatient and assist as needed. GCPulliam, RN, CCM.   Brooke Bonito C. Roena Malady, RN, MS, CCM [facility] Liaison [facility] [phone]

## 2011-11-12 NOTE — Progress Notes (Signed)
Subjective: Chart reviewed. Patient indicates that he was doing better until this morning and has since noticed right big toe and knee pain. He gives history of gout. Worsening pain he suggests is almost similar to his gout flare.  Objective: Blood pressure 140/66, pulse 77, temperature 99.9 F (37.7 C), temperature source Oral, resp. rate 18, height 5\' 4"  (1.626 m), weight 96.525 kg (212 lb 12.8 oz), SpO2 96.00%.  Intake/Output Summary (Last 24 hours) at 11/12/11 1559 Last data filed at 11/12/11 1251  Gross per 24 hour  Intake   1040 ml  Output   1650 ml  Net   -610 ml    General Exam: Comfortable. Respiratory System: Clear. No increased work of breathing. Cardiovascular System: First and second heart sounds heard. Regular rate and rhythm. No JVD/murmurs. Gastrointestinal System: Abdomen is non distended, soft and normal bowel sounds heard. Central Nervous System: Alert and oriented. No focal neurological deficits. Extremities: Left leg is wrapped from foot to just below the knee. Patient is able to wiggle the toes without any problem. Left knee has no acute findings. Right lower extremity shows mild swelling and warmth of the right knee with painful range of motion but no redness or crepitus. Right metatarsophalangeal joint of the big toe is mildly tender but no warmth or redness. Patient has chronic hyperpigmented changes of the lower one third of the leg which are not acute. The entire right leg below the knee is mildly warm.  Lab Results: Basic Metabolic Panel:  Basename 11/12/11 0514 11/11/11 0510 11/09/11 2203  NA 138 136 --  K 3.1* 2.6* --  CL 94* 96 --  CO2 32 33* --  GLUCOSE 106* 114* --  BUN 16 18 --  CREATININE 1.15 1.01 --  CALCIUM 9.6 9.4 --  MG -- -- 1.9  PHOS -- -- --   Liver Function Tests:  Basename 11/12/11 0514 11/11/11 0510  AST 23 17  ALT 12 10  ALKPHOS 69 50  BILITOT 1.2 0.7  PROT 6.9 5.8*  ALBUMIN 2.6* 2.4*   No results found for this basename:  LIPASE:2,AMYLASE:2 in the last 72 hours No results found for this basename: AMMONIA:2 in the last 72 hours CBC:  Basename 11/12/11 0514 11/11/11 0510 11/09/11 2203  WBC 12.2* 9.4 --  NEUTROABS -- -- 5.8  HGB 14.0 12.6* --  HCT 41.9 37.9* --  MCV 101.0* 99.0 --  PLT 239 208 --   Cardiac Enzymes:  Basename 11/10/11 1409 11/10/11 0555 11/09/11 2203  CKTOTAL 86 70 86  CKMB 2.8 2.5 3.2  CKMBINDEX -- -- --  TROPONINI <0.30 <0.30 <0.30   Coagulation:  Basename 11/12/11 0514 11/10/11 0555  LABPROT 25.5* 23.8*  INR 2.28* 2.09*    Studies/Results: Dg Chest 2 View  11/09/2011  *RADIOLOGY REPORT*  Clinical Data: Bradycardia  CHEST - 2 VIEW  Comparison: 12/31/2010  IMPRESSION: Cardiomegaly without decompensation.  Chronic change.  Original Report Authenticated By: Donavan Burnet, M.D.    Medications: Scheduled Meds:   . acetaminophen  500 mg Oral QHS  . allopurinol  300 mg Oral Daily  . aspirin  81 mg Oral Daily  . atenolol  50 mg Oral Daily  . cholecalciferol  1,000 Units Oral Daily  . furosemide  40 mg Intravenous Daily  . gabapentin  100 mg Oral TID  . pantoprazole  40 mg Oral Daily  . piperacillin-tazobactam (ZOSYN)  IV  3.375 g Intravenous Q8H  . polyethylene glycol  17 g Oral Daily  .  potassium chloride  20 mEq Oral BID  . warfarin  2.5 mg Oral q1800   Continuous Infusions:  PRN Meds:.ALPRAZolam, bisacodyl, meclizine, nitroGLYCERIN, oxyCODONE-acetaminophen, traMADol, DISCONTD:  HYDROmorphone (DILAUDID) injection  Assessment/Plan: 1. Acute arthritis of his right knee and right big toe metatarsophalangeal joint, suspicious for acute gouty flare: We'll treat with short course of steroids and monitor. Continue pain medications. Giving 1 dose today and will have to be reassessed daily and prednisone will have to be reordered. 2. Hypokalemia: Secondary to diuretics: Replete and monitor daily BMP. 3. ? Cellulitis complicating venous stasis left greater than the right:  Continue IV Zosyn for now. 4. Generalized weakness: Possibly secondary to hypokalemia and acute medical illness. We'll get physical therapy occupational therapy evaluation. 5. Atrial fibrillation, with controlled ventricular rate and anticoagulated. 6. Chronic diastolic congestive heart failure: Compensated.    Discussed patient's care at length with his son and daughter-in-law who were at the bedside.     Jemal Miskell 11/12/2011, 3:59 PM

## 2011-11-12 NOTE — Progress Notes (Signed)
ANTICOAGULATION CONSULT NOTE - Initial Consult  Pharmacy Consult for Coumadin Indication: atrial fibrillation  Allergies  Allergen Reactions  . Amlodipine Besylate   . Azithromycin   . Codeine   . Digoxin   . Indomethacin   . Lorazepam   . Sulfonamide Derivatives     Patient Measurements: Height: 5\' 4"  (162.6 cm) Weight: 212 lb 12.8 oz (96.525 kg) IBW/kg (Calculated) : 59.2   Vital Signs: Temp: 99.9 F (37.7 C) (01/03 1250) BP: 140/66 mmHg (01/03 1250) Pulse Rate: 77  (01/03 1250)  Labs:  Basename 11/12/11 0514 11/11/11 0510 11/10/11 1409 11/10/11 0555 11/09/11 2203  HGB 14.0 12.6* -- -- --  HCT 41.9 37.9* -- 39.6 --  PLT 239 208 -- 220 --  APTT -- -- -- -- --  LABPROT 25.5* -- -- 23.8* --  INR 2.28* -- -- 2.09* --  HEPARINUNFRC -- -- -- -- --  CREATININE 1.15 1.01 -- 0.93 --  CKTOTAL -- -- 86 70 86  CKMB -- -- 2.8 2.5 3.2  TROPONINI -- -- <0.30 <0.30 <0.30   Estimated Creatinine Clearance: 48.3 ml/min (by C-G formula based on Cr of 1.15).  Medical History: Past Medical History  Diagnosis Date  . Hypertension   . CHF (congestive heart failure)   . Arrhythmia     afib  . Neuropathy     Medications:  Prescriptions prior to admission  Medication Sig Dispense Refill  . acetaminophen (TYLENOL) 500 MG tablet Take 500 mg by mouth at bedtime.        Marland Kitchen allopurinol (ZYLOPRIM) 300 MG tablet Take 300 mg by mouth daily.        Marland Kitchen ALPRAZolam (XANAX) 0.5 MG tablet Take 0.5 mg by mouth at bedtime as needed. For sleep      . amoxicillin-clavulanate (AUGMENTIN) 875-125 MG per tablet Take 1 tablet by mouth 2 (two) times daily.        Marland Kitchen aspirin 81 MG tablet Take 81 mg by mouth every other day.       Marland Kitchen atenolol (TENORMIN) 50 MG tablet Take 50 mg by mouth daily.        . cholecalciferol (VITAMIN D) 1000 UNITS tablet Take 1,000 Units by mouth daily.        Marland Kitchen esomeprazole (NEXIUM) 40 MG capsule Take 40 mg by mouth as needed. For acid reflux      . furosemide (LASIX) 80 MG  tablet Take 80 mg by mouth 2 (two) times daily.        Marland Kitchen gabapentin (NEURONTIN) 100 MG capsule Take 100 mg by mouth 3 (three) times daily.        . meclizine (ANTIVERT) 12.5 MG tablet Take 6.25 mg by mouth 3 (three) times daily as needed. For dizziness       . metolazone (ZAROXOLYN) 2.5 MG tablet Take 2.5 mg by mouth as needed. 30 minutes before furosemide ; for swelling      . potassium chloride SA (K-DUR,KLOR-CON) 20 MEQ tablet Take 20 mEq by mouth daily.       Marland Kitchen warfarin (COUMADIN) 2.5 MG tablet Take 2.5 mg by mouth daily at 6 PM.       . nitroGLYCERIN (NITROSTAT) 0.4 MG SL tablet Place 0.4 mg under the tongue every 5 (five) minutes as needed. For chest pain       Scheduled:    . acetaminophen  500 mg Oral QHS  . allopurinol  300 mg Oral Daily  . aspirin  81 mg Oral Daily  .  atenolol  50 mg Oral Daily  . cholecalciferol  1,000 Units Oral Daily  . furosemide  60 mg Oral Daily  . gabapentin  100 mg Oral TID  . pantoprazole  40 mg Oral Daily  . piperacillin-tazobactam (ZOSYN)  IV  3.375 g Intravenous Q8H  . polyethylene glycol  17 g Oral Daily  . potassium chloride  20 mEq Oral BID  . potassium chloride  40 mEq Oral Once  . predniSONE  50 mg Oral Once  . warfarin  2.5 mg Oral q1800  . DISCONTD: furosemide  40 mg Intravenous Daily    Assessment: 78 y.o. Male on coumadin prior to admission for Atrial fibrillation. PTA coumadin dose was 2.5mg  daily. T Admitted 11/09/11 for ?cellulitis BLE being treated with IV Zosyn. Today INR = 2.28 is therapeutic. CBC stable. No bleeding noted. Patient has continued on his PTA dose of Coumadin 2.5mg  daily since admission 11/09/11.   Goal of Therapy:  INR 2-3   Plan:  Continue home dosage of coumadin 2.5mg  po daily @ 18:00. Monitor daily PT/INR.   Arman Filter 11/12/2011,5:22 PM

## 2011-11-13 LAB — CBC
HCT: 40.7 % (ref 39.0–52.0)
Hemoglobin: 13.7 g/dL (ref 13.0–17.0)
MCH: 33.5 pg (ref 26.0–34.0)
RBC: 4.09 MIL/uL — ABNORMAL LOW (ref 4.22–5.81)

## 2011-11-13 LAB — BASIC METABOLIC PANEL
BUN: 18 mg/dL (ref 6–23)
CO2: 29 mEq/L (ref 19–32)
Calcium: 9.7 mg/dL (ref 8.4–10.5)
Glucose, Bld: 175 mg/dL — ABNORMAL HIGH (ref 70–99)
Sodium: 131 mEq/L — ABNORMAL LOW (ref 135–145)

## 2011-11-13 MED ORDER — PREDNISONE 20 MG PO TABS
20.0000 mg | ORAL_TABLET | Freq: Two times a day (BID) | ORAL | Status: DC
  Administered 2011-11-13 – 2011-11-14 (×3): 20 mg via ORAL
  Filled 2011-11-13 (×5): qty 1

## 2011-11-13 MED ORDER — FUROSEMIDE 80 MG PO TABS
80.0000 mg | ORAL_TABLET | Freq: Every day | ORAL | Status: DC
  Administered 2011-11-13 – 2011-11-18 (×6): 80 mg via ORAL
  Filled 2011-11-13 (×6): qty 1

## 2011-11-13 NOTE — Progress Notes (Signed)
Physical Therapy Evaluation Patient Details Name: [name] MRN: [id number] DOB: 1923/10/18 Today's Date: 11/13/2011  Problem List:  Patient Active Problem List  Diagnoses  . ATRIAL FIBRILLATION  . CHRONIC DIASTOLIC HEART FAILURE  . CARDIAC PACEMAKER IN SITU    Past Medical History:  Past Medical History  Diagnosis Date  . Hypertension   . CHF (congestive heart failure)   . Arrhythmia     afib  . Neuropathy    Past Surgical History:  Past Surgical History  Procedure Date  . Pacemaker insertion     PT Assessment/Plan/Recommendation PT Assessment Clinical Impression Statement: Pt is 78 y/o male with generalized weakness with recurrent cellulitis.  Pt will benefit from acute PT services to prepare for safe d/c to next venue to improve overall impairments listed below. PT Recommendation/Assessment: Patient will need skilled PT in the acute care venue PT Problem List: Decreased activity tolerance;Decreased balance;Decreased mobility;Decreased coordination;Decreased knowledge of use of DME;Decreased safety awareness;Decreased knowledge of precautions;Decreased strength;Decreased range of motion PT Therapy Diagnosis : Difficulty walking;Abnormality of gait;Generalized weakness PT Plan PT Frequency: Min 3X/week PT Treatment/Interventions: DME instruction;Gait training;Stair training;Functional mobility training;Therapeutic activities;Therapeutic exercise;Balance training;Neuromuscular re-education;Patient/family education PT Recommendation Follow Up Recommendations: Skilled nursing facility Equipment Recommended: Defer to next venue PT Goals  Acute Rehab PT Goals PT Goal Formulation: With patient/family Time For Goal Achievement: 2 weeks Pt will go Supine/Side to Sit: with supervision PT Goal: Supine/Side to Sit - Progress: Not met Pt will go Sit to Supine/Side: with supervision PT Goal: Sit to Supine/Side - Progress: Not met Pt will go Sit to Stand: with supervision PT  Goal: Sit to Stand - Progress: Not met Pt will go Stand to Sit: with supervision PT Goal: Stand to Sit - Progress: Not met Pt will Transfer Bed to Chair/Chair to Bed: with min assist PT Transfer Goal: Bed to Chair/Chair to Bed - Progress: Not met Pt will Ambulate: 16 - 50 feet;with min assist;with rolling walker PT Goal: Ambulate - Progress: Not met  PT Evaluation Precautions/Restrictions  Precautions Precautions: Fall Required Braces or Orthoses: No Restrictions Weight Bearing Restrictions: No Prior Functioning  Home Living Lives With: Alone Receives Help From: Family Type of Home: House Home Layout: One level Home Access: Stairs to enter Entrance Stairs-Rails: Can reach both Entrance Stairs-Number of Steps: 2 Bathroom Shower/Tub: [name]: Handicapped height Bathroom Accessibility: Yes How Accessible: Accessible via walker Home Adaptive Equipment: Sock aid;Grab bars in shower;Tub transfer bench;Walker - rolling;Straight cane;Bedside commode/3-in-1;Wheelchair - manual Prior Function Level of Independence: Independent with basic ADLs;Independent with homemaking with ambulation;Independent with transfers;Independent with gait;Needs assistance with homemaking Light Housekeeping: Total Able to Take Stairs?: Yes Driving: Yes Vocation: Retired Producer, television/film/video: Awake/alert Overall Cognitive Status: Appears within functional limits for tasks assessed Sensation/Coordination Sensation Light Touch: Appears Intact Coordination Gross Motor Movements are Fluid and Coordinated: Yes Fine Motor Movements are Fluid and Coordinated: Yes Extremity Assessment RUE Assessment RUE Assessment: Within Functional Limits LUE Assessment LUE Assessment: Within Functional Limits RLE Assessment RLE Assessment: Exceptions to [name] RLE Strength RLE Overall Strength: Deficits RLE Overall Strength Comments: 3/5 gross with bed mobility LLE  Assessment LLE Assessment: Exceptions to [facility] LLE Strength LLE Overall Strength: Deficits LLE Overall Strength Comments: at least 3/5 gross with bed mobility Mobility (including Balance) Bed Mobility Bed Mobility: Yes Supine to Sit: 3: Mod assist Supine to Sit Details (indicate cue type and reason): Assistance with LE and trunk OOB with max cues for technique and needs extra time to complete  task. Sit to Supine - Right: 3: Mod assist Scooting to HOB:  (min guard assist) Transfers Transfers: Yes Sit to Stand: 1: +2 Total assist;From bed (60%) Sit to Stand Details (indicate cue type and reason): Max cues for hand and LE placement. Stand to Sit: To chair/3-in-1;1: +2 Total assist (pt 60%) Stand to Sit Details: VC and manual cues for hand placement Stand Pivot Transfers: Patient percentage (comment);1: +2 Total assist (pt 60%) Stand Pivot Transfer Details (indicate cue type and reason): VCs for hand placement with constant cues for technique during transfer.  Needs tactile and manual cues to advance LE.  Balance Balance Assessed: Yes Static Sitting Balance Static Sitting - Balance Support: Feet supported Static Sitting - Level of Assistance: 5: Stand by assistance Static Sitting - Comment/# of Minutes: Sat EOB ~ 5 minutes to prepare for transfer with minguard for safety Exercise    End of Session PT - End of Session Equipment Utilized During Treatment: Gait belt Activity Tolerance: Patient tolerated treatment well Patient left: in chair;with call bell in reach Nurse Communication: Mobility status for transfers General Behavior During Session: [name] for tasks performed Cognition: [name] for tasks performed  [name] 11/13/2011, 2:52 PM [phone]

## 2011-11-13 NOTE — Progress Notes (Signed)
Occupational Therapy Evaluation Patient Details Name: [name] MRN: [id number] DOB: 1923/02/09 Today's Date: 11/13/2011  Problem List:  Patient Active Problem List  Diagnoses  . ATRIAL FIBRILLATION  . CHRONIC DIASTOLIC HEART FAILURE  . CARDIAC PACEMAKER IN SITU    Past Medical History:  Past Medical History  Diagnosis Date  . Hypertension   . CHF (congestive heart failure)   . Arrhythmia     afib  . Neuropathy    Past Surgical History:  Past Surgical History  Procedure Date  . Pacemaker insertion     OT Assessment/Plan/Recommendation OT Assessment Clinical Impression Statement: Pt demos decline in function with safety, balance, activity tolerance and strength. Pt would benefit fom skilled OT services to addresss these impairments to increase independence and maximize level of fucntion OT Recommendation/Assessment: Patient will need skilled OT in the acute care venue OT Problem List: Decreased strength;Decreased activity tolerance;Pain;Decreased knowledge of precautions;Impaired balance (sitting and/or standing);Decreased knowledge of use of DME or AE Problem List Comments: LE pain Barriers to Discharge: Decreased caregiver support Barriers to Discharge Comments: Pt lives at home alone OT Therapy Diagnosis : Generalized weakness OT Plan OT Frequency: Min 1X/week OT Treatment/Interventions: Self-care/ADL training;Therapeutic activities;Therapeutic exercise;Neuromuscular education;DME and/or AE instruction;Patient/family education;Balance training OT Recommendation Follow Up Recommendations: Skilled nursing facility;Other (comment) (Pt prefers to discharge to SNF for therapy) Equipment Recommended: Defer to next venue Individuals Consulted Consulted and Agree with Results and Recommendations: Patient OT Goals Acute Rehab OT Goals OT Goal Formulation: With patient Time For Goal Achievement: 2 weeks ADL Goals Pt Will Perform Grooming: with set-up;with  supervision;Standing at sink ADL Goal: Grooming - Progress: Not met Pt Will Perform Lower Body Bathing: with min assist;Sitting, edge of bed;Sitting at sink ADL Goal: Lower Body Bathing - Progress: Not met Pt Will Perform Lower Body Dressing: with mod assist;Sitting, chair;Sitting, bed;Sit to stand from chair;Sit to stand from bed ADL Goal: Lower Body Dressing - Progress: Not met Pt Will Transfer to Toilet: with mod assist;with DME;Grab bars ADL Goal: Toilet Transfer - Progress: Not met Pt Will Perform Toileting - Clothing Manipulation: with mod assist ADL Goal: Toileting - Clothing Manipulation - Progress: Not met Additional ADL Goal #1: Pt will increase UE strength x 1 grade to complete ADL and functional tasks safely  OT Evaluation Precautions/Restrictions  Precautions Precautions: Fall Required Braces or Orthoses: No Restrictions Weight Bearing Restrictions: No Prior Functioning Home Living Lives With: Alone Receives Help From: Family Type of Home: House Home Layout: One level Home Access: Stairs to enter Entrance Stairs-Rails: Can reach both Entrance Stairs-Number of Steps: 2 Bathroom Shower/Tub: Forensic scientist: Handicapped height Bathroom Accessibility: Yes How Accessible: Accessible via walker Home Adaptive Equipment: Sock aid;Grab bars in shower;Tub transfer bench;Walker - rolling;Straight cane;Bedside commode/3-in-1;Wheelchair - manual Prior Function Level of Independence: Independent with basic ADLs;Independent with homemaking with ambulation;Independent with transfers;Independent with gait;Needs assistance with homemaking Light Housekeeping: Total Able to Take Stairs?: Yes Driving: Yes Vocation: Retired ADL ADL Eating/Feeding: Not assessed Grooming: Performed;Wash/dry hands;Wash/dry face;Brushing hair;Set up Where Assessed - Grooming: Sitting, bed Upper Body Bathing: Simulated;Minimal assistance Where Assessed - Upper Body Bathing:  Sitting, bed Lower Body Bathing: Moderate assistance Where Assessed - Lower Body Bathing: Sitting, bed Upper Body Dressing: Performed;Minimal assistance Where Assessed - Upper Body Dressing: Sitting, bed Lower Body Dressing: Maximal assistance Where Assessed - Lower Body Dressing: Sitting, bed Toilet Transfer: Simulated;+2 Total assistance Toilet Transfer Details (indicate cue type and reason): sit - stand simultaed transfer bed to chair Toilet Transfer Method:  Stand pivot;Ambulating Toileting - Clothing Manipulation: Not assessed Where Assessed - Toileting Clothing Manipulation: Not assessed Toileting - Hygiene: Not assessed Where Assessed - Toileting Hygiene: Not assessed Tub/Shower Transfer: Not assessed Tub/Shower Transfer Method: Not assessed Equipment Used: Rolling walker Vision/Perception  Vision - History Baseline Vision: Wears glasses all the time Patient Visual Report: No change from baseline Perception Perception: Within Functional Limits Cognition Cognition Arousal/Alertness: Awake/alert Overall Cognitive Status: Appears within functional limits for tasks assessed Sensation/Coordination Sensation Light Touch: Appears Intact Coordination Gross Motor Movements are Fluid and Coordinated: Yes Fine Motor Movements are Fluid and Coordinated: Yes Extremity Assessment RUE Assessment RUE Assessment: Within Functional Limits (MMT 3+/5) LUE Assessment LUE Assessment: Within Functional Limits (MMT 3+/5) Mobility  Bed Mobility Bed Mobility: Yes Sit to Supine - Right: 3: Mod assist Scooting to HOB: Other (comment) (min guard assist) Transfers Transfers: Yes Sit to Stand: 1: +2 Total assist;From bed Sit to Stand Details (indicate cue type and reason): verbal and physical cues for correct hand placement, LE movement Stand to Sit: To chair/3-in-1;1: +2 Total assist Stand to Sit Details: verbal and physical cues for correct hand placement, LE movement  End of Session OT -  End of Session Equipment Utilized During Treatment: Gait belt;Other (comment) (RW) Activity Tolerance: Patient tolerated treatment well;Patient limited by pain in LEs Patient left: in chair;with call bell in reach;with family/visitor present General Behavior During Session: [facility] for tasks performed Cognition: [facility] for tasks performed   Galen Manila 11/13/2011, 1:52 PM

## 2011-11-13 NOTE — Plan of Care (Signed)
Problem: Phase II Progression Outcomes Goal: Discharge plan established Pt prefers [facility] SNF for further therapy needs after acute discharge. OT agrees with this as pt lives at home alone and demos significant weakness

## 2011-11-13 NOTE — Consult Note (Addendum)
WOC follow up Wound type: venous stasis ulceration x 2 LLE, lateral and medial  Wound bed: pale with yellow slough in both wounds, unchanged from Wednesday appearance  Drainage (amount, consistency, odor) moderate amount, no odor  Periwound:intact with hemosiderin staining consistent with venous stasis dx.    Dressing procedure/placement/frequency: pt was placed in Profore multilayer compression wrap on Wednesday 11/11/11, for change today then to weekly placement.  RN at bedside reports that Dr. Valentina Lucks would like wrap left off for weekend medical staff to assess.  Dr. Valentina Lucks has been contacted while at on the floor and he would like wrap replaced. Edema resolved with compression, however pt is concerned that he does not want any type of dressing that sticks to his skin on the leg.  I will switch topical care to alginate with silver to absorb exudate and treat biofilm on wound. Will replace multilayer compression over this and change 2x wk , again on Tuesday (11/17/11)  WOC will follow   Thanks  Cataleyah Colborn RN, CWOCN [phone]) Conservative sharp wound debridement (CSWD performed at the bedside:     11/13/11 Spoke with Dr. Valentina Lucks regarding pts RLE, more edematous today and with two small fluid filled blisters.  Requested orders to place pt in his compression stocking if able to fit over current level of edema if not to place in multilayer compression wrap.  Family and pt in agreement to have RLE wrapped with multilayer wrap and I will re assess Monday, if edema down then will convert pt back to compression stocking on this leg and continue wound care for LLE.  Placed multilayer compression wrap on RLE, pt tolerated without problems.  Olevia Westervelt [employer], [employer]

## 2011-11-13 NOTE — Progress Notes (Signed)
ANTICOAGULATION CONSULT NOTE - Follow Up Consult  Pharmacy Consult for Coumadin Indication: atrial fibrillation  Allergies  Allergen Reactions  . Amlodipine Besylate   . Azithromycin   . Codeine   . Digoxin   . Indomethacin   . Lorazepam   . Sulfonamide Derivatives     Patient Measurements: Height: 5\' 4"  (162.6 cm) Weight: 208 lb 1.6 oz (94.394 kg) (bedscale) IBW/kg (Calculated) : 59.2   Vital Signs: Temp: 98 F (36.7 C) (01/04 0721) BP: 119/77 mmHg (01/04 0721) Pulse Rate: 75  (01/04 0721)  Labs:  Basename 11/13/11 0610 11/12/11 0514 11/11/11 0510 11/10/11 1409  HGB 13.7 14.0 -- --  HCT 40.7 41.9 37.9* --  PLT 248 239 208 --  APTT -- -- -- --  LABPROT -- 25.5* -- --  INR -- 2.28* -- --  HEPARINUNFRC -- -- -- --  CREATININE 1.07 1.15 1.01 --  CKTOTAL -- -- -- 86  CKMB -- -- -- 2.8  TROPONINI -- -- -- <0.30   Estimated Creatinine Clearance: 51.4 ml/min (by C-G formula based on Cr of 1.07).  Assessment:     PT/INR not done this AM.    INR at goal 12/31, 1/1 and 1/3.    On home Coumadin regimen of 2.5 mg daily.  Goal of Therapy:  INR 2-3   Plan:    Will continue Coumadin 2.5 mg daily.   Daily PT/INR to begin in AM.  Dennie Fetters Pager: [phone] 11/13/2011,12:06 PM

## 2011-11-13 NOTE — Progress Notes (Signed)
Subjective: R knee much better.  Objective: Vital signs in last 24 hours: Temp:  [98.8 F (37.1 C)-99.9 F (37.7 C)] 98.8 F (37.1 C) (01/03 2100) Pulse Rate:  [77-92] 81  (01/03 2100) Resp:  [18-20] 20  (01/03 2100) BP: (125-140)/(66-88) 134/88 mmHg (01/03 2100) SpO2:  [92 %-96 %] 92 % (01/03 2100) Weight change:  Last BM Date: 11/12/11  Intake/Output from previous day: 01/03 0701 - 01/04 0700 In: 780 [P.O.:780] Out: 1200 [Urine:1200] Intake/Output this shift:    General appearance: alert and cooperative Resp: clear to auscultation bilaterally Cardio: irregularly irregular rhythm GI: soft, non-tender; bowel sounds normal; no masses,  no organomegaly Extremities: edema trace edema.  R knee small effusion, pain with motion  Lab Results:  Basename 11/13/11 0610 11/12/11 0514  WBC 15.9* 12.2*  HGB 13.7 14.0  HCT 40.7 41.9  PLT 248 239   BMET  Basename 11/13/11 0610 11/12/11 0514  NA 131* 138  K 3.9 3.1*  CL 93* 94*  CO2 29 32  GLUCOSE 175* 106*  BUN 18 16  CREATININE 1.07 1.15  CALCIUM 9.7 9.6    Studies/Results: No results found.  Medications: I have reviewed the patient's current medications.  Assessment/Plan: Active Problems: R knee pain, possible gout, will start taper steroids Hypokalemia resolved, continue potassium L leg Cellulitis/CVI  Unna boot in place, zosyn 4, failed outpt oral antibiotics, consider change to po     augmentin over the weekend Generalized Weakness  Deconditioned.  PT/OT to see.  May need short term placement for rehab Atrial Fibrillation  Rate controlled, on coumadin Diastolic Dysfunction  Stable on one daily dose of lasix   LOS: 4 days   Aerik Polan JOSEPH 11/13/2011, 7:40 AM

## 2011-11-14 ENCOUNTER — Encounter (HOSPITAL_COMMUNITY): Payer: Self-pay | Admitting: *Deleted

## 2011-11-14 LAB — PROTIME-INR: INR: 2.63 — ABNORMAL HIGH (ref 0.00–1.49)

## 2011-11-14 LAB — BASIC METABOLIC PANEL
BUN: 26 mg/dL — ABNORMAL HIGH (ref 6–23)
CO2: 30 mEq/L (ref 19–32)
Glucose, Bld: 115 mg/dL — ABNORMAL HIGH (ref 70–99)
Potassium: 3.9 mEq/L (ref 3.5–5.1)
Sodium: 136 mEq/L (ref 135–145)

## 2011-11-14 MED ORDER — PREDNISONE 10 MG PO TABS
10.0000 mg | ORAL_TABLET | Freq: Two times a day (BID) | ORAL | Status: DC
  Administered 2011-11-14 – 2011-11-15 (×3): 10 mg via ORAL
  Filled 2011-11-14 (×6): qty 1

## 2011-11-14 MED ORDER — AMOXICILLIN-POT CLAVULANATE 875-125 MG PO TABS
1.0000 | ORAL_TABLET | Freq: Two times a day (BID) | ORAL | Status: DC
  Administered 2011-11-14 – 2011-11-18 (×9): 1 via ORAL
  Filled 2011-11-14 (×11): qty 1

## 2011-11-14 NOTE — Progress Notes (Signed)
Subjective: Patient is pleasant, no apparent distress, without fever or chills. His legs are wrapped in that unna boot he is without any complaint of pain.. According to Nurse there is no excessive drainage. Dressings are due to be changed tomorrow.  Objective: Vital signs in last 24 hours: Temp:  [97.7 F (36.5 C)-98.7 F (37.1 C)] 97.7 F (36.5 C) (01/05 0504) Pulse Rate:  [72-87] 72  (01/05 0504) Resp:  [18-24] 22  (01/05 0504) BP: (122-129)/(71-81) 122/81 mmHg (01/05 0504) SpO2:  [95 %-96 %] 96 % (01/05 0504) Weight:  [94.62 kg (208 lb 9.6 oz)] 208 lb 9.6 oz (94.62 kg) (01/05 0504) Weight change:  Last BM Date: 11/13/11  Intake/Output from previous day: 01/04 0701 - 01/05 0700 In: 1260 [P.O.:1160; IV Piggyback:100] Out: 1600 [Urine:1600] Intake/Output this shift:    Extremities: Legs are wrapped, dressing not soaked  Lab Results:  Basename 11/13/11 0610 11/12/11 0514  WBC 15.9* 12.2*  HGB 13.7 14.0  HCT 40.7 41.9  PLT 248 239   BMET  Basename 11/14/11 0600 11/13/11 0610  NA 136 131*  K 3.9 3.9  CL 96 93*  CO2 30 29  GLUCOSE 115* 175*  BUN 26* 18  CREATININE 0.94 1.07  CALCIUM 9.4 9.7    Studies/Results: No results found.  Medications:  Prior to Admission:  Prescriptions prior to admission  Medication Sig Dispense Refill  . acetaminophen (TYLENOL) 500 MG tablet Take 500 mg by mouth at bedtime.        Marland Kitchen allopurinol (ZYLOPRIM) 300 MG tablet Take 300 mg by mouth daily.        Marland Kitchen ALPRAZolam (XANAX) 0.5 MG tablet Take 0.5 mg by mouth at bedtime as needed. For sleep      . amoxicillin-clavulanate (AUGMENTIN) 875-125 MG per tablet Take 1 tablet by mouth 2 (two) times daily.        Marland Kitchen aspirin 81 MG tablet Take 81 mg by mouth every other day.       Marland Kitchen atenolol (TENORMIN) 50 MG tablet Take 50 mg by mouth daily.        . cholecalciferol (VITAMIN D) 1000 UNITS tablet Take 1,000 Units by mouth daily.        Marland Kitchen esomeprazole (NEXIUM) 40 MG capsule Take 40 mg by mouth as  needed. For acid reflux      . furosemide (LASIX) 80 MG tablet Take 80 mg by mouth 2 (two) times daily.        Marland Kitchen gabapentin (NEURONTIN) 100 MG capsule Take 100 mg by mouth 3 (three) times daily.        . meclizine (ANTIVERT) 12.5 MG tablet Take 6.25 mg by mouth 3 (three) times daily as needed. For dizziness       . metolazone (ZAROXOLYN) 2.5 MG tablet Take 2.5 mg by mouth as needed. 30 minutes before furosemide ; for swelling      . potassium chloride SA (K-DUR,KLOR-CON) 20 MEQ tablet Take 20 mEq by mouth daily.       Marland Kitchen warfarin (COUMADIN) 2.5 MG tablet Take 2.5 mg by mouth daily at 6 PM.       . nitroGLYCERIN (NITROSTAT) 0.4 MG SL tablet Place 0.4 mg under the tongue every 5 (five) minutes as needed. For chest pain       Scheduled:   . acetaminophen  500 mg Oral QHS  . allopurinol  300 mg Oral Daily  . aspirin  81 mg Oral Daily  . atenolol  50 mg Oral Daily  .  cholecalciferol  1,000 Units Oral Daily  . furosemide  80 mg Oral Daily  . gabapentin  100 mg Oral TID  . pantoprazole  40 mg Oral Daily  . piperacillin-tazobactam (ZOSYN)  IV  3.375 g Intravenous Q8H  . polyethylene glycol  17 g Oral Daily  . potassium chloride  20 mEq Oral BID  . predniSONE  20 mg Oral BID WC  . warfarin  2.5 mg Oral q1800   Continuous:   Assessment/Plan: Lower extremity cellulitis, tolerating wraps, no problems partners. A. fib Knee pain presumed gout improved with steroids We'll convert antibiotics to by mouth, continue to taper steroids    LOS: 5 days   Alinna Siple D 11/14/2011, 10:06 AM

## 2011-11-14 NOTE — Progress Notes (Signed)
ANTICOAGULATION CONSULT NOTE - Follow Up Consult  Pharmacy Consult for Coumadin Indication: atrial fibrillation  Assessment:  88yoM on coumadin prior to arrival for atrial fibrillation. Home dose is coumadin 2.5mg  daily. INR therapeutic at 2.63. Hgb 13.7; no overt bleeding reported in MD/RN notes.    Goal of Therapy:  INR 2-3   Plan:  1) Will continue Coumadin 2.5 mg daily at 1800. 2) Follow-up Daily PT/INR.  Benjaman Pott, PharmD     Pager [phone] 11/14/2011   8:27 AM   -------------------------------  Allergies  Allergen Reactions  . Amlodipine Besylate   . Azithromycin   . Codeine   . Digoxin   . Indomethacin   . Lorazepam   . Sulfonamide Derivatives     Patient Measurements: Height: 5\' 4"  (162.6 cm) Weight: 208 lb 9.6 oz (94.62 kg) (bed scale pt unable to stand) IBW/kg (Calculated) : 59.2   Vital Signs: Temp: 97.7 F (36.5 C) (01/05 0504) BP: 122/81 mmHg (01/05 0504) Pulse Rate: 72  (01/05 0504)  Labs:  Basename 11/14/11 0600 11/13/11 0610 11/12/11 0514  HGB -- 13.7 14.0  HCT -- 40.7 41.9  PLT -- 248 239  APTT -- -- --  LABPROT 28.5* -- 25.5*  INR 2.63* -- 2.28*  HEPARINUNFRC -- -- --  CREATININE 0.94 1.07 1.15  CKTOTAL -- -- --  CKMB -- -- --  TROPONINI -- -- --   Estimated Creatinine Clearance: 58.6 ml/min (by C-G formula based on Cr of 0.94).

## 2011-11-15 LAB — PROTIME-INR: Prothrombin Time: 31.1 seconds — ABNORMAL HIGH (ref 11.6–15.2)

## 2011-11-15 MED ORDER — WARFARIN SODIUM 2 MG PO TABS
2.0000 mg | ORAL_TABLET | Freq: Once | ORAL | Status: AC
  Administered 2011-11-15: 2 mg via ORAL
  Filled 2011-11-15: qty 1

## 2011-11-15 NOTE — Progress Notes (Signed)
ANTICOAGULATION CONSULT NOTE - Follow Up Consult  Pharmacy Consult for Coumadin Indication: atrial fibrillation  Assessment:  88yoM on coumadin prior to arrival for atrial fibrillation. Home dose is coumadin 2.5mg  daily. INR therapeutic at 2.94 but appears to be slowly trending up--will decrease today's dose slightly and monitor.  Hgb 13.7 on 11/12/11; no overt bleeding reported in MD/RN notes.    Goal of Therapy:  INR 2-3   Plan:  1) Coumadin 2mg  PO x 1 today 2) Follow-up Daily PT/INR.  Benjaman Pott, PharmD     Pager [phone] 11/15/2011   9:22 AM   -------------------------------  Allergies  Allergen Reactions  . Amlodipine Besylate   . Azithromycin   . Codeine   . Digoxin   . Indomethacin   . Lorazepam   . Sulfonamide Derivatives     Patient Measurements: Height: 5\' 4"  (162.6 cm) Weight: 208 lb 8 oz (94.575 kg) IBW/kg (Calculated) : 59.2   Vital Signs: Temp: 97.6 F (36.4 C) (01/06 0606) Temp src: Oral (01/06 0606) BP: 141/76 mmHg (01/06 0606) Pulse Rate: 95  (01/06 0606)  Labs:  Basename 11/15/11 0500 11/14/11 0600 11/13/11 0610  HGB -- -- 13.7  HCT -- -- 40.7  PLT -- -- 248  APTT -- -- --  LABPROT 31.1* 28.5* --  INR 2.94* 2.63* --  HEPARINUNFRC -- -- --  CREATININE -- 0.94 1.07  CKTOTAL -- -- --  CKMB -- -- --  TROPONINI -- -- --   Estimated Creatinine Clearance: 58.6 ml/min (by C-G formula based on Cr of 0.94).

## 2011-11-16 ENCOUNTER — Encounter: Payer: Self-pay | Admitting: *Deleted

## 2011-11-16 ENCOUNTER — Inpatient Hospital Stay (HOSPITAL_COMMUNITY): Payer: Medicare Other

## 2011-11-16 LAB — PROTIME-INR
INR: 3.02 — ABNORMAL HIGH (ref 0.00–1.49)
Prothrombin Time: 31.8 seconds — ABNORMAL HIGH (ref 11.6–15.2)

## 2011-11-16 MED ORDER — PREDNISONE 20 MG PO TABS
20.0000 mg | ORAL_TABLET | Freq: Two times a day (BID) | ORAL | Status: DC
  Administered 2011-11-16 – 2011-11-17 (×2): 20 mg via ORAL
  Filled 2011-11-16 (×4): qty 1

## 2011-11-16 MED ORDER — COLCHICINE 0.6 MG PO TABS
0.6000 mg | ORAL_TABLET | Freq: Two times a day (BID) | ORAL | Status: DC
  Administered 2011-11-16 – 2011-11-18 (×5): 0.6 mg via ORAL
  Filled 2011-11-16 (×6): qty 1

## 2011-11-16 NOTE — Progress Notes (Signed)
ANTICOAGULATION CONSULT NOTE - Follow Up Consult  Pharmacy Consult for Coumadin Indication: atrial fibrillation  Allergies  Allergen Reactions  . Amlodipine Besylate   . Azithromycin   . Codeine   . Digoxin   . Indomethacin   . Lorazepam   . Sulfonamide Derivatives     Patient Measurements: Height: 5\' 4"  (162.6 cm) Weight: 208 lb 5.4 oz (94.5 kg) IBW/kg (Calculated) : 59.2    Vital Signs: Temp: 97.3 F (36.3 C) (01/07 0352) BP: 118/81 mmHg (01/07 0352) Pulse Rate: 75  (01/07 0352)  Labs:  Basename 11/16/11 0605 11/15/11 0500 11/14/11 0600  HGB -- -- --  HCT -- -- --  PLT -- -- --  APTT -- -- --  LABPROT 31.8* 31.1* 28.5*  INR 3.02* 2.94* 2.63*  HEPARINUNFRC -- -- --  CREATININE -- -- 0.94  CKTOTAL -- -- --  CKMB -- -- --  TROPONINI -- -- --   Estimated Creatinine Clearance: 58.5 ml/min (by C-G formula based on Cr of 0.94).   Medications:  Scheduled:    . acetaminophen  500 mg Oral QHS  . allopurinol  300 mg Oral Daily  . amoxicillin-clavulanate  1 tablet Oral Q12H  . aspirin  81 mg Oral Daily  . atenolol  50 mg Oral Daily  . cholecalciferol  1,000 Units Oral Daily  . colchicine  0.6 mg Oral BID  . furosemide  80 mg Oral Daily  . gabapentin  100 mg Oral TID  . pantoprazole  40 mg Oral Daily  . polyethylene glycol  17 g Oral Daily  . potassium chloride  20 mEq Oral BID  . predniSONE  20 mg Oral BID WC  . warfarin  2 mg Oral ONCE-1800  . DISCONTD: predniSONE  10 mg Oral BID WC   Anti-infectives     Start     Dose/Rate Route Frequency Ordered Stop   11/14/11 1100  amoxicillin-clavulanate (AUGMENTIN) 875-125 MG per tablet 1 tablet       1 tablet Oral Every 12 hours 11/14/11 1011     11/09/11 2200   piperacillin-tazobactam (ZOSYN) IVPB 3.375 g  Status:  Discontinued        3.375 g 12.5 mL/hr over 240 Minutes Intravenous Every 8 hours 11/09/11 2127 11/14/11 1011          Assessment: Patient is a 78 y/o male with A-fib on chronic Coumadin. 2.5mg   daily.  INR has been steadily increasing since admission and is 3.02 today.  No complications reported.  Patient is currently on Augmentin for L-leg cellulitis.  Goal of Therapy:  INR 2-3   Plan:  Hold Coumadin today.  [name], Elisha Headland, Pharm.D. 11/16/2011 11:04 AM

## 2011-11-16 NOTE — Progress Notes (Signed)
Subjective: Pain in R knee worse, hard to walk with PT or flex knee.  Objective: Vital signs in last 24 hours: Temp:  [97.3 F (36.3 C)-97.8 F (36.6 C)] 97.3 F (36.3 C) (01/07 0352) Pulse Rate:  [70-79] 75  (01/07 0352) Resp:  [18] 18  (01/07 0352) BP: (118-158)/(67-85) 118/81 mmHg (01/07 0352) SpO2:  [97 %] 97 % (01/07 0352) Weight:  [94.5 kg (208 lb 5.4 oz)] 208 lb 5.4 oz (94.5 kg) (01/07 0352) Weight change: -0.075 kg (-2.6 oz) Last BM Date: 11/15/11  Intake/Output from previous day: 01/06 0701 - 01/07 0700 In: 1402 [P.O.:1402] Out: 2401 [Urine:2400; Stool:1] Intake/Output this shift: Total I/O In: 120 [P.O.:120] Out: 350 [Urine:350]  General appearance: alert and cooperative Resp: clear to auscultation bilaterally Cardio: irregularly irregular rhythm Extremities: both legs wrapped, R knee small effusion and pain with flexion  Lab Results: No results found for this basename: WBC:2,HGB:2,HCT:2,PLT:2 in the last 72 hours BMET  Basename 11/14/11 0600  NA 136  K 3.9  CL 96  CO2 30  GLUCOSE 115*  BUN 26*  CREATININE 0.94  CALCIUM 9.4    Studies/Results: No results found.  Medications: I have reviewed the patient's current medications.  Assessment/Plan: Active Problems  R knee pain.  Has worsened again despite steroids, ? Gout vs other.  Check x-ray and consider ortho to see. Increase prednisone to 20mg  bid and add colchicine Cellulitis, L leg, reportedly improved, on day 7/10 antibiotics Atrial Fibrillation  Rate controlled, on coumadin NHP for rehab   LOS: 7 days   Merland Holness JOSEPH 11/16/2011, 7:50 AM

## 2011-11-16 NOTE — Progress Notes (Signed)
Patient requests short term SNF- wants [facility]. Yellow note and Fl2 placed in shadow chart for MD's signature.  SNF referral completed to [facility]; awaiting bed offer and stability per MD.  Darylene Price, BSW, 01/07/20124:14 PM

## 2011-11-17 ENCOUNTER — Other Ambulatory Visit: Payer: Medicare Other

## 2011-11-17 LAB — BASIC METABOLIC PANEL
BUN: 26 mg/dL — ABNORMAL HIGH (ref 6–23)
Calcium: 9.5 mg/dL (ref 8.4–10.5)
Creatinine, Ser: 1.03 mg/dL (ref 0.50–1.35)
GFR calc non Af Amer: 64 mL/min — ABNORMAL LOW (ref >90)
Glucose, Bld: 110 mg/dL — ABNORMAL HIGH (ref 70–99)

## 2011-11-17 LAB — DIFFERENTIAL
Basophils Relative: 0 % (ref 0–1)
Eosinophils Absolute: 0 10*3/uL (ref 0.0–0.7)
Monocytes Relative: 8 % (ref 3–12)
Neutrophils Relative %: 77 % (ref 43–77)

## 2011-11-17 LAB — CBC
MCH: 33.4 pg (ref 26.0–34.0)
MCHC: 33.2 g/dL (ref 30.0–36.0)
Platelets: 327 10*3/uL (ref 150–400)
RDW: 14.1 % (ref 11.5–15.5)

## 2011-11-17 MED ORDER — WARFARIN SODIUM 2 MG PO TABS
2.0000 mg | ORAL_TABLET | Freq: Once | ORAL | Status: AC
  Administered 2011-11-17: 2 mg via ORAL
  Filled 2011-11-17: qty 1

## 2011-11-17 MED ORDER — POTASSIUM CHLORIDE CRYS ER 10 MEQ PO TBCR
10.0000 meq | EXTENDED_RELEASE_TABLET | Freq: Two times a day (BID) | ORAL | Status: DC
  Administered 2011-11-17 – 2011-11-18 (×3): 10 meq via ORAL
  Filled 2011-11-17 (×4): qty 1

## 2011-11-17 MED ORDER — PREDNISONE 20 MG PO TABS
40.0000 mg | ORAL_TABLET | Freq: Two times a day (BID) | ORAL | Status: DC
  Administered 2011-11-17 – 2011-11-18 (×2): 40 mg via ORAL
  Filled 2011-11-17 (×4): qty 2

## 2011-11-17 NOTE — Progress Notes (Signed)
Physical Therapy Treatment Patient Details Name: [name] MRN: [id number] DOB: 08-07-1923 Today's Date: 11/17/2011  PT Assessment/Plan  PT - Assessment/Plan Comments on Treatment Session: much improved up on his feet.  Pain continues at 6/10 but manageable and pt is quite happy PT Plan: Discharge plan remains appropriate Follow Up Recommendations: Skilled nursing facility Equipment Recommended: Defer to next venue PT Goals  Acute Rehab PT Goals PT Goal Formulation: With patient/family PT Goal: Supine/Side to Sit - Progress: Progressing toward goal PT Goal: Sit to Supine/Side - Progress: Progressing toward goal PT Goal: Sit to Stand - Progress: Progressing toward goal PT Goal: Stand to Sit - Progress: Progressing toward goal PT Transfer Goal: Bed to Chair/Chair to Bed - Progress: Progressing toward goal PT Goal: Ambulate - Progress: Progressing toward goal  PT Treatment Precautions/Restrictions  Precautions Precautions: Fall Required Braces or Orthoses: No Restrictions Weight Bearing Restrictions: No Mobility (including Balance) Bed Mobility Bed Mobility: Yes Supine to Sit: 3: Mod assist Supine to Sit Details (indicate cue type and reason): vc's for technique (up onto L elbow), manual A for truncal A Sitting - Scoot to Edge of Bed: Other (comment) (min guard A) Transfers Transfers: Yes Sit to Stand: From bed;Patient percentage (comment);1: +2 Total assist;Other (comment) (pt=60% progressed to mod A) Sit to Stand Details (indicate cue type and reason): vc's for hand placement; manual A to come forward over BOS  Stand to Sit: 3: Mod assist (to chair/BSC; +2 pt=60% to lower toilet) Stand Pivot Transfers: 3: Mod assist Stand Pivot Transfer Details (indicate cue type and reason): with RW and support while w/shifting and advancing R LE Ambulation/Gait Ambulation/Gait: Yes Ambulation/Gait Assistance: 4: Min assist Ambulation/Gait Assistance Details (indicate cue type and  reason): step to pattern and antalgic Ambulation Distance (Feet): 25 Feet Assistive device: Rolling walker Gait Pattern: Step-to pattern;Antalgic  Posture/Postural Control Posture/Postural Control: No significant limitations Static Sitting Balance Static Sitting - Balance Support: No upper extremity supported;Feet supported Static Sitting - Level of Assistance: 5: Stand by assistance Static Sitting - Comment/# of Minutes: 10 Exercise    End of Session PT - End of Session Equipment Utilized During Treatment: Gait belt Activity Tolerance: Patient tolerated treatment well Patient left: in chair;with call bell in reach;with family/visitor present Nurse Communication: Mobility status for transfers;Mobility status for ambulation General Behavior During Session: [facility] for tasks performed Cognition: [facility] for tasks performed  Brittanny Levenhagen, Eliseo Gum 11/17/2011, 10:41 AM  11/17/2011  Edgewood Bing, PT [phone] (pager)

## 2011-11-17 NOTE — Consult Note (Signed)
WOC follow up  Pt with bil. LE multilayer compression wraps in place.  Wrapped last on Friday 11/13/11.  Assessment today: pt has much less edema in the RLE, with plans to convert pt to his compression hose today after edema reduced however pt and family request that I rewrap right LE instead of using compression stocking due to gout pain in knee and they feel that hose will cut more into knee area than wrap does.  RLE did have 2 blisters present on Friday when I wrapped but they have resolved now, no open ulcers or blisters of the RLE.  LLE had venous stasis ulcer of the medial malleolar region, today this area has resolved and has closed. Dressing placed on this area from Friday has no drainage present.  No open wound noted on LLE only some dried serous material on skin, cleansed and no topical dressing applied other than non adherent (per pt /family request).   Wrapped bil. LE with multilayer compression wraps 30-37mmHG compression. Will plan to change leg wraps on Friday unless pt dc prior to this. Will need orders at dc for continued compression of LE to prevent recurrence of leg ulcers and edema.   WOC team will follow [name] [name], [place] [phone]

## 2011-11-17 NOTE — Progress Notes (Signed)
ANTICOAGULATION CONSULT NOTE - Follow Up Consult  Pharmacy Consult for Coumadin Indication: atrial fibrillation  Allergies  Allergen Reactions  . Amlodipine Besylate   . Azithromycin   . Codeine   . Digoxin   . Indomethacin   . Lorazepam   . Sulfonamide Derivatives     Patient Measurements: Height: 5\' 4"  (162.6 cm) Weight: 208 lb 5.4 oz (94.5 kg) (scale c) IBW/kg (Calculated) : 59.2    Vital Signs: Temp: 97.4 F (36.3 C) (01/08 0500) Temp src: Oral (01/08 0500) BP: 150/90 mmHg (01/08 0500) Pulse Rate: 141  (01/08 1000)  Labs:  Basename 11/17/11 0523 11/16/11 0605 11/15/11 0500  HGB 13.6 -- --  HCT 41.0 -- --  PLT 327 -- --  APTT -- -- --  LABPROT 30.7* 31.8* 31.1*  INR 2.89* 3.02* 2.94*  HEPARINUNFRC -- -- --  CREATININE 1.03 -- --  CKTOTAL -- -- --  CKMB -- -- --  TROPONINI -- -- --   Estimated Creatinine Clearance: 53.4 ml/min (by C-G formula based on Cr of 1.03).   Medications:  Prescriptions prior to admission  Medication Sig Dispense Refill  . acetaminophen (TYLENOL) 500 MG tablet Take 500 mg by mouth at bedtime.        Marland Kitchen allopurinol (ZYLOPRIM) 300 MG tablet Take 300 mg by mouth daily.        Marland Kitchen ALPRAZolam (XANAX) 0.5 MG tablet Take 0.5 mg by mouth at bedtime as needed. For sleep      . amoxicillin-clavulanate (AUGMENTIN) 875-125 MG per tablet Take 1 tablet by mouth 2 (two) times daily.        Marland Kitchen aspirin 81 MG tablet Take 81 mg by mouth every other day.       Marland Kitchen atenolol (TENORMIN) 50 MG tablet Take 50 mg by mouth daily.        . cholecalciferol (VITAMIN D) 1000 UNITS tablet Take 1,000 Units by mouth daily.        Marland Kitchen esomeprazole (NEXIUM) 40 MG capsule Take 40 mg by mouth as needed. For acid reflux      . furosemide (LASIX) 80 MG tablet Take 80 mg by mouth 2 (two) times daily.        Marland Kitchen gabapentin (NEURONTIN) 100 MG capsule Take 100 mg by mouth 3 (three) times daily.        . meclizine (ANTIVERT) 12.5 MG tablet Take 6.25 mg by mouth 3 (three) times daily as  needed. For dizziness       . metolazone (ZAROXOLYN) 2.5 MG tablet Take 2.5 mg by mouth as needed. 30 minutes before furosemide ; for swelling      . potassium chloride SA (K-DUR,KLOR-CON) 20 MEQ tablet Take 20 mEq by mouth daily.       Marland Kitchen warfarin (COUMADIN) 2.5 MG tablet Take 2.5 mg by mouth daily at 6 PM.       . nitroGLYCERIN (NITROSTAT) 0.4 MG SL tablet Place 0.4 mg under the tongue every 5 (five) minutes as needed. For chest pain       Scheduled:    . acetaminophen  500 mg Oral QHS  . allopurinol  300 mg Oral Daily  . amoxicillin-clavulanate  1 tablet Oral Q12H  . aspirin  81 mg Oral Daily  . atenolol  50 mg Oral Daily  . cholecalciferol  1,000 Units Oral Daily  . colchicine  0.6 mg Oral BID  . furosemide  80 mg Oral Daily  . gabapentin  100 mg Oral TID  . pantoprazole  40 mg Oral Daily  . polyethylene glycol  17 g Oral Daily  . potassium chloride  10 mEq Oral BID  . predniSONE  40 mg Oral BID WC  . DISCONTD: potassium chloride  20 mEq Oral BID  . DISCONTD: predniSONE  20 mg Oral BID WC   Anti-infectives     Start     Dose/Rate Route Frequency Ordered Stop   11/14/11 1100  amoxicillin-clavulanate (AUGMENTIN) 875-125 MG per tablet 1 tablet       1 tablet Oral Every 12 hours 11/14/11 1011     11/09/11 2200   piperacillin-tazobactam (ZOSYN) IVPB 3.375 g  Status:  Discontinued        3.375 g 12.5 mL/hr over 240 Minutes Intravenous Every 8 hours 11/09/11 2127 11/14/11 1011          Assessment: Patient is an 78 y/o male on chronic Coumadin for history of A-fib.  After holding Coumadin yesterday, INR now within therapeutic goal range.  Goal of Therapy:  INR 2-3   Plan:  Coumadin 2mg  po today.  Raquel Racey, Elisha Headland, Pharm.D. 11/17/2011 10:39 AM

## 2011-11-17 NOTE — Progress Notes (Signed)
Bed offer in place from [facility]- can accept patient when medically stable per MD. Will monitor. Thanks Darylene Price, BSW, 11/17/2011 10:06 AM  .

## 2011-11-17 NOTE — Progress Notes (Signed)
Subjective: Still pain in R knee.     Objective: Vital signs in last 24 hours: Temp:  [97.4 F (36.3 C)-97.9 F (36.6 C)] 97.4 F (36.3 C) (01/08 0500) Pulse Rate:  [86-106] 106  (01/08 0500) Resp:  [18-20] 18  (01/08 0500) BP: (134-163)/(79-91) 150/90 mmHg (01/08 0500) SpO2:  [94 %-100 %] 100 % (01/08 0500) Weight:  [94.5 kg (208 lb 5.4 oz)] 208 lb 5.4 oz (94.5 kg) (01/08 0500) Weight change: 0 kg (0 lb) Last BM Date: 11/16/11  Intake/Output from previous day: 01/07 0701 - 01/08 0700 In: 580 [P.O.:580] Out: 2151 [Urine:2150; Stool:1] Intake/Output this shift:   3 General appearance: alert R knee small effusion, pain with flexion  Lab Results:  Basename 11/17/11 0523  WBC 11.6*  HGB 13.6  HCT 41.0  PLT 327   BMET  Basename 11/17/11 0523  NA 141  K 4.3  CL 102  CO2 31  GLUCOSE 110*  BUN 26*  CREATININE 1.03  CALCIUM 9.5    Studies/Results: Dg Knee 1-2 Views Right  11/16/2011  *RADIOLOGY REPORT*  Clinical Data: Right knee pain.  RIGHT KNEE - 1-2 VIEW  Comparison: None.  Findings: No joint effusion or fracture.  Trace patellofemoral osteophytosis.  Chondrocalcinosis is seen in the the medial and lateral compartments.  IMPRESSION: Mild degenerative changes.  No acute findings.  Original Report Authenticated By: Reyes Ivan, M.D.    Medications: I have reviewed the patient's current medications.  Assessment/Plan: Active Problems  R knee pain, suspect gout or pseudogout, x-ray negative, increase prednisone, continue cochicine  L leg Cellulitis, antiobiotics 8/10, wound care to change wraps today  CHF/CVI  Continue lasix  Atrial Fibrillation, on coumadin  NHP, need better control of knee pain before discharge, hope to d/c by Thursday 1/10.    LOS: 8 days   Adolpho Meenach JOSEPH 11/17/2011, 7:25 AM

## 2011-11-18 LAB — PROTIME-INR
INR: 2.39 — ABNORMAL HIGH (ref 0.00–1.49)
Prothrombin Time: 26.5 seconds — ABNORMAL HIGH (ref 11.6–15.2)

## 2011-11-18 MED ORDER — FUROSEMIDE 80 MG PO TABS: 80.0000 mg | ORAL_TABLET | Freq: Every day | ORAL | Status: DC

## 2011-11-18 MED ORDER — COLCHICINE 0.6 MG PO TABS: 0.6000 mg | ORAL_TABLET | Freq: Two times a day (BID) | ORAL | Status: DC

## 2011-11-18 MED ORDER — PREDNISONE 20 MG PO TABS: 40.0000 mg | ORAL_TABLET | Freq: Two times a day (BID) | ORAL | Status: AC

## 2011-11-18 MED ORDER — TRAMADOL HCL 50 MG PO TABS: 50.0000 mg | ORAL_TABLET | Freq: Four times a day (QID) | ORAL | Status: AC | PRN

## 2011-11-18 MED ORDER — WARFARIN SODIUM 2 MG PO TABS: 2.0000 mg | ORAL_TABLET | Freq: Every day | ORAL | Status: DC

## 2011-11-18 MED ORDER — POTASSIUM CHLORIDE CRYS ER 10 MEQ PO TBCR: 10.0000 meq | EXTENDED_RELEASE_TABLET | Freq: Two times a day (BID) | ORAL | Status: DC

## 2011-11-18 MED ORDER — OXYCODONE-ACETAMINOPHEN 5-325 MG PO TABS: 2.0000 | ORAL_TABLET | Freq: Four times a day (QID) | ORAL | Status: AC | PRN

## 2011-11-18 NOTE — Discharge Summary (Signed)
Physician Discharge Summary  Patient ID: [name] MRN: [id number] DOB/AGE: 05-17-1923 78 y.o.  Admit date: 11/09/2011 Discharge date: 11/18/2011  Admission Diagnoses: generalized weakness Hypokalemia Left leg cellulitis Chronic venous insufficiency Congestive heart versus diastolic dysfunction  Discharge Diagnoses: Generalized weakness Left leg cellulitis Hypokalemia Chronic venous insufficiency Right knee gout Congestive heart versus diastolic dysfunction Coronary artery disease Hypertension History of non-Hodgkin's lymphoma     Discharged Condition: good  Hospital Course: The patient was admitted with generalized weakness, difficulty ambulating and was found to have a left lower leg cellulitis related to chronic venous insufficiency ulcer which has been treated at the wound Center. He was placed on IV Zosyn and then completed a course with Augmentin with resolution of cellulitis symptoms. He was seen by the wound care nurse and was given lower extremity multilayer compression wraps to the left and also the right lower extremity with improvement in his edema and resolution of the left leg stasis ulcer. He did develop some right lower leg blisters which resolved with compression. At discharge the wound care nurse was recommending continued multilayer compression wraps at 30-40 mm of mercury due to be changed on Friday, January 11. The patient will be followed at the wound Center after discharge from nursing home. The patient did develop some right knee pain with effusion. This was presumptively treated as gout with prednisone and then colchicine and was improving at discharge. Prednisone taper was written and colchicine will be continued for 4 more days. An x-ray of the right knee was unremarkable. The patient has significant hypokalemia with potassium of 2.4 at admission, this was corrected by discharge. His Lasix dose was decreased from 80 mg twice a day to once a day. The patient  H. or fibrillation remained well controlled and he is on Coumadin, his Coumadin dose was decreased from 2.5-2 mg a day and his PT/INR will need to be followed as an outpatient. The patient was seen by physical therapy who felt that short-term nursing home placement for rehabilitation was appropriate because of his marked generalized weakness.  Consults: none  Significant Diagnostic Studies: labs:   Treatments: antibiotics: Zosyn, analgesia: acetaminophen and oxycodone, anticoagulation: warfarin, steroids: prednisone and therapies: PT, OT and wound care   Discharge Exam: Blood pressure 146/77, pulse 78, temperature 98.3 F (36.8 C), temperature source Oral, resp. rate 18, height 5\' 4"  (1.626 m), weight 93.622 kg (206 lb 6.4 oz), SpO2 98.00%. General appearance: alert and cooperative Resp: clear to auscultation bilaterally Cardio: irregularly irregular rhythm  Disposition:  nursing home. Diet low-sodium diet CODE STATUS full code   activity as per physical therapy and occupational therapy   Medication List  As of 11/18/2011  8:02 AM   START taking these medications         colchicine 0.6 MG tablet   Take 1 tablet (0.6 mg total) by mouth 2 (two) times daily.      oxyCODONE-acetaminophen 5-325 MG per tablet   Commonly known as: PERCOCET   Take 2 tablets by mouth every 6 (six) hours as needed.      predniSONE 20 MG tablet   Commonly known as: DELTASONE   Take 2 tablets (40 mg total) by mouth 2 (two) times daily with a meal.      traMADol 50 MG tablet   Commonly known as: ULTRAM   Take 1 tablet (50 mg total) by mouth every 6 (six) hours as needed.         CHANGE how you take these  medications         furosemide 80 MG tablet   Commonly known as: LASIX   Take 1 tablet (80 mg total) by mouth daily.   What changed: how often to take the med      potassium chloride 10 MEQ tablet   Commonly known as: K-DUR,KLOR-CON   Take 1 tablet (10 mEq total) by mouth 2 (two) times daily.    What changed: - medication strength - dose - how often to take the med      warfarin 2 MG tablet   Commonly known as: COUMADIN   Take 1 tablet (2 mg total) by mouth daily at 6 PM.   What changed: - medication strength - dose         CONTINUE taking these medications         acetaminophen 500 MG tablet   Commonly known as: TYLENOL      allopurinol 300 MG tablet   Commonly known as: ZYLOPRIM      ALPRAZolam 0.5 MG tablet   Commonly known as: XANAX      aspirin 81 MG tablet      atenolol 50 MG tablet   Commonly known as: TENORMIN      cholecalciferol 1000 UNITS tablet   Commonly known as: VITAMIN D      esomeprazole 40 MG capsule   Commonly known as: NEXIUM      gabapentin 100 MG capsule   Commonly known as: NEURONTIN      meclizine 12.5 MG tablet   Commonly known as: ANTIVERT      nitroGLYCERIN 0.4 MG SL tablet   Commonly known as: NITROSTAT         STOP taking these medications         amoxicillin-clavulanate 875-125 MG per tablet      metolazone 2.5 MG tablet          Where to get your medications    These are the prescriptions that you need to pick up. We sent them to a specific pharmacy, so you will need to go there to get them.   [id number] [place], [place] - [address]    [address] [place] [address]    Phone: [phone]        potassium chloride 10 MEQ tablet   predniSONE 20 MG tablet   traMADol 50 MG tablet   warfarin 2 MG tablet         You may get these medications from any pharmacy.         colchicine 0.6 MG tablet   furosemide 80 MG tablet   oxyCODONE-acetaminophen 5-325 MG per tablet           Follow-up Information    Follow up with Lillia Mountain, MD in 4 weeks.         SignedLillia Mountain 11/18/2011, 8:02 AM

## 2011-11-18 NOTE — Progress Notes (Signed)
Patient discussed at the Long Length of Stay [name] Weeks 11/18/2011  

## 2011-11-18 NOTE — Progress Notes (Signed)
DC today to [facility] via EMS- ok per MD, SNF, patient, family and nursing.  Darylene Price, BSW, 11/18/2011 11:19 AM

## 2011-11-18 NOTE — Discharge Summary (Signed)
Physician Discharge Summary  Patient ID: [name] MRN: [id number] DOB/AGE: Nov 18, 1922 78 y.o.  Admit date: 11/09/2011 Discharge date: 11/18/2011  Admission Diagnoses: generalized weakness Hypokalemia Left leg cellulitis Chronic venous insufficiency Congestive heart versus diastolic dysfunction  Discharge Diagnoses: Generalized weakness Left leg cellulitis Hypokalemia Chronic venous insufficiency Right knee gout Congestive heart versus diastolic dysfunction Coronary artery disease Hypertension History of non-Hodgkin's lymphoma     Discharged Condition: good  Hospital Course: The patient was admitted with generalized weakness, difficulty ambulating and was found to have a left lower leg cellulitis related to chronic venous insufficiency ulcer which has been treated at the wound Center. He was placed on IV Zosyn and then completed a course with Augmentin with resolution of cellulitis symptoms. He was seen by the wound care nurse and was given lower extremity multilayer compression wraps to the left and also the right lower extremity with improvement in his edema and resolution of the left leg stasis ulcer. He did develop some right lower leg blisters which resolved with compression. At discharge the wound care nurse was recommending continued multilayer compression wraps at 30-40 mm of mercury due to be changed on Friday, January 11. The patient will be followed at the wound Center after discharge from nursing home. The patient did develop some right knee pain with effusion. This was presumptively treated as gout with prednisone and then colchicine and was improving at discharge. Prednisone taper was written and colchicine will be continued for 4 more days. An x-ray of the right knee was unremarkable. The patient has significant hypokalemia with potassium of 2.4 at admission, this was corrected by discharge. His Lasix dose was decreased from 80 mg twice a day to once a day. The patient  H. or fibrillation remained well controlled and he is on Coumadin, his Coumadin dose was decreased from 2.5-2 mg a day and his PT/INR will need to be followed as an outpatient. The patient was seen by physical therapy who felt that short-term nursing home placement for rehabilitation was appropriate because of his marked generalized weakness.  Consults: none  Significant Diagnostic Studies: labs:   Treatments: antibiotics: Zosyn, analgesia: acetaminophen and oxycodone, anticoagulation: warfarin, steroids: prednisone and therapies: PT, OT and wound care   Discharge Exam: Blood pressure 146/77, pulse 78, temperature 98.3 F (36.8 C), temperature source Oral, resp. rate 18, height 5\' 4"  (1.626 m), weight 93.622 kg (206 lb 6.4 oz), SpO2 98.00%. General appearance: alert and cooperative Resp: clear to auscultation bilaterally Cardio: irregularly irregular rhythm  Disposition:  nursing home. Diet low-sodium diet CODE STATUS full code   activity as per physical therapy and occupational therapy   Medication List  As of 11/18/2011  7:29 AM   START taking these medications         colchicine 0.6 MG tablet   Take 1 tablet (0.6 mg total) by mouth 2 (two) times daily.      oxyCODONE-acetaminophen 5-325 MG per tablet   Commonly known as: PERCOCET   Take 2 tablets by mouth every 6 (six) hours as needed.      predniSONE 20 MG tablet   Commonly known as: DELTASONE   Take 2 tablets (40 mg total) by mouth 2 (two) times daily with a meal.      traMADol 50 MG tablet   Commonly known as: ULTRAM   Take 1 tablet (50 mg total) by mouth every 6 (six) hours as needed.         CHANGE how you take these  medications         furosemide 80 MG tablet   Commonly known as: LASIX   Take 1 tablet (80 mg total) by mouth daily.   What changed: how often to take the med      potassium chloride 10 MEQ tablet   Commonly known as: K-DUR,KLOR-CON   Take 1 tablet (10 mEq total) by mouth 2 (two) times daily.    What changed: - medication strength - dose - how often to take the med      warfarin 2 MG tablet   Commonly known as: COUMADIN   Take 1 tablet (2 mg total) by mouth daily at 6 PM.   What changed: - medication strength - dose         CONTINUE taking these medications         acetaminophen 500 MG tablet   Commonly known as: TYLENOL      allopurinol 300 MG tablet   Commonly known as: ZYLOPRIM      ALPRAZolam 0.5 MG tablet   Commonly known as: XANAX      aspirin 81 MG tablet      atenolol 50 MG tablet   Commonly known as: TENORMIN      cholecalciferol 1000 UNITS tablet   Commonly known as: VITAMIN D      esomeprazole 40 MG capsule   Commonly known as: NEXIUM      gabapentin 100 MG capsule   Commonly known as: NEURONTIN      meclizine 12.5 MG tablet   Commonly known as: ANTIVERT      nitroGLYCERIN 0.4 MG SL tablet   Commonly known as: NITROSTAT         STOP taking these medications         amoxicillin-clavulanate 875-125 MG per tablet      metolazone 2.5 MG tablet          Where to get your medications    These are the prescriptions that you need to pick up. We sent them to a specific pharmacy, so you will need to go there to get them.   [id number] [place], [place] - [address]    [address] [place] [address]    Phone: [phone]        potassium chloride 10 MEQ tablet   predniSONE 20 MG tablet   traMADol 50 MG tablet   warfarin 2 MG tablet         You may get these medications from any pharmacy.         colchicine 0.6 MG tablet   furosemide 80 MG tablet   oxyCODONE-acetaminophen 5-325 MG per tablet           Follow-up Information    Follow up with Lillia Mountain, MD in 4 weeks.         SignedLillia Mountain 11/18/2011, 7:29 AM

## 2011-11-18 NOTE — Progress Notes (Signed)
PT D/C'D VIA AMBULANCE/PTAR TO SNF, [facility]. D/C INSTRUCTIONS REVIEWED WITH PT AND HIS SON. BOTH VERBALIZED UNDERSTANDING OF ALL INSTRUCTIONS. SCRIPTS GIVEN TO PT'S SON. COPY OF INSTRUCTIONS GIVEN TO PT/PT'S SON AND A COPY PLACED IN PT'S PACKET FOR SNF. PT D/C'D WITH BELONGINGS CARRIED BY [employer] AND PT'S SON.

## 2011-11-18 NOTE — Progress Notes (Signed)
Attempted to see pt but pt d/c'd earlier today. Tory Emerald, [place]   [phone]

## 2011-11-19 ENCOUNTER — Encounter (HOSPITAL_COMMUNITY): Payer: Self-pay | Admitting: Emergency Medicine

## 2011-11-29 ENCOUNTER — Inpatient Hospital Stay (HOSPITAL_COMMUNITY)
Admission: EM | Admit: 2011-11-29 | Discharge: 2011-12-03 | DRG: 641 | Disposition: A | Discharge status: Skilled Nursing Facility | Payer: Medicare Other | Attending: Internal Medicine | Admitting: Internal Medicine

## 2011-11-29 ENCOUNTER — Encounter (HOSPITAL_COMMUNITY): Payer: Self-pay

## 2011-11-29 ENCOUNTER — Other Ambulatory Visit: Payer: Self-pay

## 2011-11-29 ENCOUNTER — Emergency Department (HOSPITAL_COMMUNITY): Payer: Medicare Other

## 2011-11-29 DIAGNOSIS — M109 Gout, unspecified: Secondary | ICD-10-CM | POA: Diagnosis present

## 2011-11-29 DIAGNOSIS — R531 Weakness: Secondary | ICD-10-CM | POA: Diagnosis present

## 2011-11-29 DIAGNOSIS — I10 Essential (primary) hypertension: Secondary | ICD-10-CM | POA: Diagnosis present

## 2011-11-29 DIAGNOSIS — Z7901 Long term (current) use of anticoagulants: Secondary | ICD-10-CM

## 2011-11-29 DIAGNOSIS — I83009 Varicose veins of unspecified lower extremity with ulcer of unspecified site: Secondary | ICD-10-CM | POA: Diagnosis present

## 2011-11-29 DIAGNOSIS — E860 Dehydration: Principal | ICD-10-CM | POA: Diagnosis present

## 2011-11-29 DIAGNOSIS — Z950 Presence of cardiac pacemaker: Secondary | ICD-10-CM

## 2011-11-29 DIAGNOSIS — L97909 Non-pressure chronic ulcer of unspecified part of unspecified lower leg with unspecified severity: Secondary | ICD-10-CM | POA: Diagnosis present

## 2011-11-29 DIAGNOSIS — Z882 Allergy status to sulfonamides status: Secondary | ICD-10-CM

## 2011-11-29 DIAGNOSIS — I959 Hypotension, unspecified: Secondary | ICD-10-CM | POA: Diagnosis present

## 2011-11-29 DIAGNOSIS — E876 Hypokalemia: Secondary | ICD-10-CM | POA: Diagnosis present

## 2011-11-29 DIAGNOSIS — Z7982 Long term (current) use of aspirin: Secondary | ICD-10-CM

## 2011-11-29 DIAGNOSIS — I2510 Atherosclerotic heart disease of native coronary artery without angina pectoris: Secondary | ICD-10-CM | POA: Diagnosis present

## 2011-11-29 DIAGNOSIS — Z87891 Personal history of nicotine dependence: Secondary | ICD-10-CM

## 2011-11-29 DIAGNOSIS — G589 Mononeuropathy, unspecified: Secondary | ICD-10-CM | POA: Diagnosis present

## 2011-11-29 DIAGNOSIS — IMO0002 Reserved for concepts with insufficient information to code with codable children: Secondary | ICD-10-CM

## 2011-11-29 DIAGNOSIS — G629 Polyneuropathy, unspecified: Secondary | ICD-10-CM | POA: Diagnosis present

## 2011-11-29 DIAGNOSIS — Z888 Allergy status to other drugs, medicaments and biological substances status: Secondary | ICD-10-CM

## 2011-11-29 DIAGNOSIS — I482 Chronic atrial fibrillation, unspecified: Secondary | ICD-10-CM | POA: Diagnosis present

## 2011-11-29 DIAGNOSIS — Z79899 Other long term (current) drug therapy: Secondary | ICD-10-CM

## 2011-11-29 DIAGNOSIS — I4891 Unspecified atrial fibrillation: Secondary | ICD-10-CM | POA: Diagnosis present

## 2011-11-29 DIAGNOSIS — I951 Orthostatic hypotension: Secondary | ICD-10-CM

## 2011-11-29 DIAGNOSIS — I5032 Chronic diastolic (congestive) heart failure: Secondary | ICD-10-CM | POA: Diagnosis present

## 2011-11-29 DIAGNOSIS — I509 Heart failure, unspecified: Secondary | ICD-10-CM | POA: Diagnosis present

## 2011-11-29 DIAGNOSIS — R5381 Other malaise: Secondary | ICD-10-CM | POA: Diagnosis present

## 2011-11-29 DIAGNOSIS — C8589 Other specified types of non-Hodgkin lymphoma, extranodal and solid organ sites: Secondary | ICD-10-CM | POA: Diagnosis present

## 2011-11-29 DIAGNOSIS — I872 Venous insufficiency (chronic) (peripheral): Secondary | ICD-10-CM | POA: Diagnosis present

## 2011-11-29 LAB — COMPREHENSIVE METABOLIC PANEL
Alkaline Phosphatase: 69 U/L (ref 39–117)
BUN: 23 mg/dL (ref 6–23)
CO2: 29 mEq/L (ref 19–32)
Chloride: 98 mEq/L (ref 96–112)
Creatinine, Ser: 1.17 mg/dL (ref 0.50–1.35)
GFR calc Af Amer: 63 mL/min — ABNORMAL LOW (ref >90)
GFR calc non Af Amer: 55 mL/min — ABNORMAL LOW (ref >90)
Glucose, Bld: 101 mg/dL — ABNORMAL HIGH (ref 70–99)
Potassium: 4.2 mEq/L (ref 3.5–5.1)
Total Bilirubin: 0.8 mg/dL (ref 0.3–1.2)

## 2011-11-29 LAB — URINALYSIS, ROUTINE W REFLEX MICROSCOPIC
Bilirubin Urine: NEGATIVE
Hgb urine dipstick: NEGATIVE
Ketones, ur: NEGATIVE mg/dL
Specific Gravity, Urine: 1.007 (ref 1.005–1.030)
Urobilinogen, UA: 0.2 mg/dL (ref 0.0–1.0)

## 2011-11-29 LAB — DIFFERENTIAL
Lymphs Abs: 2 10*3/uL (ref 0.7–4.0)
Monocytes Absolute: 1.3 10*3/uL — ABNORMAL HIGH (ref 0.1–1.0)
Monocytes Relative: 13 % — ABNORMAL HIGH (ref 3–12)
Neutro Abs: 6.4 10*3/uL (ref 1.7–7.7)
Neutrophils Relative %: 65 % (ref 43–77)

## 2011-11-29 LAB — CBC
HCT: 44 % (ref 39.0–52.0)
Hemoglobin: 14.4 g/dL (ref 13.0–17.0)
RBC: 4.36 MIL/uL (ref 4.22–5.81)

## 2011-11-29 LAB — PROTIME-INR: Prothrombin Time: 24.8 seconds — ABNORMAL HIGH (ref 11.6–15.2)

## 2011-11-29 LAB — CARDIAC PANEL(CRET KIN+CKTOT+MB+TROPI): CK, MB: 3.6 ng/mL (ref 0.3–4.0)

## 2011-11-29 MED ORDER — SODIUM CHLORIDE 0.9 % IV SOLN: 250.0000 mL | INTRAVENOUS | Status: DC | PRN

## 2011-11-29 MED ORDER — ALLOPURINOL 300 MG PO TABS
300.0000 mg | ORAL_TABLET | Freq: Every day | ORAL | Status: DC
  Administered 2011-11-30 – 2011-12-03 (×4): 300 mg via ORAL
  Filled 2011-11-29 (×5): qty 1

## 2011-11-29 MED ORDER — SODIUM CHLORIDE 0.9 % IJ SOLN
3.0000 mL | Freq: Two times a day (BID) | INTRAMUSCULAR | Status: DC
  Administered 2011-11-29 – 2011-12-03 (×6): 3 mL via INTRAVENOUS

## 2011-11-29 MED ORDER — MECLIZINE HCL 12.5 MG PO TABS
6.2500 mg | ORAL_TABLET | Freq: Three times a day (TID) | ORAL | Status: DC | PRN
  Filled 2011-11-29: qty 0.5

## 2011-11-29 MED ORDER — GABAPENTIN 100 MG PO CAPS
100.0000 mg | ORAL_CAPSULE | Freq: Three times a day (TID) | ORAL | Status: DC
  Administered 2011-11-29 – 2011-12-03 (×11): 100 mg via ORAL
  Filled 2011-11-29 (×13): qty 1

## 2011-11-29 MED ORDER — PANTOPRAZOLE SODIUM 40 MG PO TBEC
40.0000 mg | DELAYED_RELEASE_TABLET | ORAL | Status: DC
  Administered 2011-11-30 – 2011-12-03 (×4): 40 mg via ORAL
  Filled 2011-11-29: qty 111
  Filled 2011-11-29 (×3): qty 1

## 2011-11-29 MED ORDER — ACETAMINOPHEN 500 MG PO TABS
1000.0000 mg | ORAL_TABLET | Freq: Every day | ORAL | Status: DC
  Administered 2011-11-29 – 2011-12-02 (×4): 1000 mg via ORAL
  Filled 2011-11-29 (×5): qty 2

## 2011-11-29 MED ORDER — NITROGLYCERIN 0.4 MG SL SUBL: 0.4000 mg | SUBLINGUAL_TABLET | SUBLINGUAL | Status: DC | PRN

## 2011-11-29 MED ORDER — WARFARIN SODIUM 2.5 MG PO TABS
2.5000 mg | ORAL_TABLET | Freq: Every day | ORAL | Status: DC
  Administered 2011-11-29 – 2011-12-02 (×4): 2.5 mg via ORAL
  Filled 2011-11-29 (×6): qty 1

## 2011-11-29 MED ORDER — WARFARIN SODIUM 2.5 MG PO TABS: 2.5000 mg | ORAL_TABLET | Freq: Every day | ORAL | Status: DC

## 2011-11-29 MED ORDER — FUROSEMIDE 40 MG PO TABS
40.0000 mg | ORAL_TABLET | Freq: Two times a day (BID) | ORAL | Status: DC
  Administered 2011-11-30 (×2): 40 mg via ORAL
  Filled 2011-11-29 (×5): qty 1

## 2011-11-29 MED ORDER — SODIUM CHLORIDE 0.9 % IJ SOLN: 3.0000 mL | INTRAMUSCULAR | Status: DC | PRN

## 2011-11-29 MED ORDER — TRAMADOL HCL 50 MG PO TABS
50.0000 mg | ORAL_TABLET | Freq: Four times a day (QID) | ORAL | Status: DC | PRN
  Administered 2011-11-30 (×2): 50 mg via ORAL
  Filled 2011-11-29 (×2): qty 1

## 2011-11-29 MED ORDER — ALPRAZOLAM 0.5 MG PO TABS
0.5000 mg | ORAL_TABLET | Freq: Every evening | ORAL | Status: DC | PRN
  Administered 2011-11-29 – 2011-12-03 (×4): 0.5 mg via ORAL
  Filled 2011-11-29 (×4): qty 1

## 2011-11-29 MED ORDER — ASPIRIN 81 MG PO TABS
81.0000 mg | ORAL_TABLET | ORAL | Status: DC
  Administered 2011-11-30 – 2011-12-02 (×2): 81 mg via ORAL
  Filled 2011-11-29 (×4): qty 1

## 2011-11-29 MED ORDER — LISINOPRIL 5 MG PO TABS
5.0000 mg | ORAL_TABLET | Freq: Every day | ORAL | Status: DC
  Administered 2011-11-30: 5 mg via ORAL
  Filled 2011-11-29 (×3): qty 1

## 2011-11-29 MED ORDER — ATENOLOL 50 MG PO TABS
50.0000 mg | ORAL_TABLET | Freq: Every day | ORAL | Status: DC
  Administered 2011-11-30 – 2011-12-03 (×4): 50 mg via ORAL
  Filled 2011-11-29 (×4): qty 1

## 2011-11-29 MED ORDER — COLCHICINE 0.6 MG PO TABS
0.6000 mg | ORAL_TABLET | Freq: Two times a day (BID) | ORAL | Status: DC
  Administered 2011-11-29 – 2011-11-30 (×3): 0.6 mg via ORAL
  Filled 2011-11-29 (×5): qty 1

## 2011-11-29 MED ORDER — VITAMIN D3 25 MCG (1000 UNIT) PO TABS
1000.0000 [IU] | ORAL_TABLET | Freq: Every day | ORAL | Status: DC
  Administered 2011-11-30 – 2011-12-03 (×4): 1000 [IU] via ORAL
  Filled 2011-11-29 (×4): qty 1

## 2011-11-29 MED ORDER — METOLAZONE 2.5 MG PO TABS
2.5000 mg | ORAL_TABLET | Freq: Every day | ORAL | Status: DC
  Administered 2011-11-30: 2.5 mg via ORAL
  Filled 2011-11-29 (×3): qty 1

## 2011-11-29 MED ORDER — SODIUM CHLORIDE 0.9 % IV BOLUS (SEPSIS)
500.0000 mL | Freq: Once | INTRAVENOUS | Status: AC
  Administered 2011-11-29: 500 mL via INTRAVENOUS

## 2011-11-29 MED ORDER — POTASSIUM CHLORIDE CRYS ER 20 MEQ PO TBCR
20.0000 meq | EXTENDED_RELEASE_TABLET | Freq: Every day | ORAL | Status: DC
  Administered 2011-11-30 – 2011-12-01 (×2): 20 meq via ORAL
  Filled 2011-11-29 (×3): qty 1

## 2011-11-29 NOTE — ED Provider Notes (Signed)
History     CSN: [id number]  Arrival date & time 11/29/11  1354   First MD Initiated Contact with Patient 11/29/11 1413      Chief Complaint  Patient presents with  . Weakness    (Consider location/radiation/quality/duration/timing/severity/associated sxs/prior treatment) HPI Comments: Patient presents from home with generalized weakness that onset this afternoon. He states he finished urinating and when he went to pull up his pants he was profoundly weak in all of his extremities and had to lower himself to the ground. He did not lose consciousness or hit his head. Denies any chest pain, shortness of breath, diaphoresis, nausea, vomiting or dizziness. Notably had a month-long hospitalization for lower extremity cellulitis and chronic venous stasis wounds. He was discharged from his nursing home yesterday.  The history is provided by the patient.    Past Medical History  Diagnosis Date  . Hypertension   . CHF (congestive heart failure)   . Arrhythmia     afib  . Neuropathy   . Cancer     Past Surgical History  Procedure Date  . Pacemaker insertion   . Cardiac surgery     No family history on file.  History  Substance Use Topics  . Smoking status: Former Smoker -- 1.0 packs/day for 15 years    Types: Cigarettes    Quit date: 11/13/1944  . Smokeless tobacco: Not on file  . Alcohol Use: No      Review of Systems  Constitutional: Positive for activity change, appetite change and fatigue. Negative for fever.  HENT: Negative for congestion and rhinorrhea.   Respiratory: Negative for cough and shortness of breath.   Cardiovascular: Negative for chest pain.  Gastrointestinal: Negative for nausea, vomiting and abdominal pain.  Genitourinary: Negative for dysuria and hematuria.  Musculoskeletal: Positive for myalgias and arthralgias. Negative for back pain.  Skin: Positive for rash and wound.  Neurological: Positive for weakness. Negative for dizziness,  light-headedness and headaches.    Allergies  Azithromycin; Codeine; Digoxin; Indomethacin; Lorazepam; Amlodipine besylate; and Sulfonamide derivatives  Home Medications   Current Outpatient Rx  Name Route Sig Dispense Refill  . ACETAMINOPHEN 500 MG PO TABS Oral Take 1,000 mg by mouth at bedtime.     . ALLOPURINOL 300 MG PO TABS Oral Take 300 mg by mouth daily.     Marland Kitchen ALPRAZOLAM 0.5 MG PO TABS Oral Take 0.5 mg by mouth at bedtime as needed. For sleep    . ASPIRIN 81 MG PO TABS Oral Take 81 mg by mouth every other day.     . ATENOLOL 50 MG PO TABS Oral Take 50 mg by mouth daily.     Marland Kitchen VITAMIN D 1000 UNITS PO TABS Oral Take 1,000 Units by mouth daily.     . COLCHICINE 0.6 MG PO TABS Oral Take 0.6 mg by mouth 2 (two) times daily. colcrys - for gout    . ESOMEPRAZOLE MAGNESIUM 40 MG PO CPDR Oral Take 40 mg by mouth as needed. For acid reflux    . FUROSEMIDE 80 MG PO TABS Oral Take 80 mg by mouth 2 (two) times daily.    Marland Kitchen GABAPENTIN 100 MG PO CAPS Oral Take 100 mg by mouth 3 (three) times daily.     Marland Kitchen LISINOPRIL 5 MG PO TABS Oral Take 5 mg by mouth daily.    Marland Kitchen MECLIZINE HCL 12.5 MG PO TABS Oral Take 6.25 mg by mouth 3 (three) times daily as needed. For dizziness     .  METOLAZONE 2.5 MG PO TABS Oral Take 2.5 mg by mouth daily as needed. 30 minutes before furosemide when needed for severe swelling    . POTASSIUM CHLORIDE CRYS ER 20 MEQ PO TBCR Oral Take 20 mEq by mouth daily.    . TRAMADOL HCL 50 MG PO TABS Oral Take 50 mg by mouth every 6 (six) hours as needed. For pain    . WARFARIN SODIUM 2.5 MG PO TABS Oral Take 2.5 mg by mouth daily.    Marland Kitchen NITROGLYCERIN 0.4 MG SL SUBL Sublingual Place 0.4 mg under the tongue every 5 (five) minutes as needed. For chest pain    . OXYCODONE-ACETAMINOPHEN 5-325 MG PO TABS Oral Take 2 tablets by mouth every 6 (six) hours as needed. 30 tablet 0  . PREDNISONE 20 MG PO TABS Oral Take 2 tablets (40 mg total) by mouth 2 (two) times daily with a meal. 12 tablet 0     40 mg bid one day, 20 mg bid 2 days, 10 mg bid 2 d ...  . TRAMADOL HCL 50 MG PO TABS Oral Take 1 tablet (50 mg total) by mouth every 6 (six) hours as needed. 30 tablet 0    BP 115/54  Pulse 73  Temp(Src) 97.2 F (36.2 C) (Oral)  Resp 19  SpO2 99%  Physical Exam  Constitutional: He is oriented to person, place, and time. He appears well-developed and well-nourished. No distress.  HENT:  Head: Normocephalic and atraumatic.  Mouth/Throat: Oropharynx is clear and moist. No oropharyngeal exudate.  Eyes: Conjunctivae are normal. Pupils are equal, round, and reactive to light.  Neck: Normal range of motion. Neck supple.  Cardiovascular: Normal rate, regular rhythm and normal heart sounds.   Pulmonary/Chest: Effort normal and breath sounds normal. No respiratory distress.  Abdominal: Soft. There is no tenderness. There is no guarding.  Musculoskeletal: He exhibits edema.       Chronic venous stasis changes of the lower showing his bilaterally. Left more swollen right. Weeping from left lower extremity +2 distal pulses  Neurological: He is alert and oriented to person, place, and time. No cranial nerve deficit.  Skin: Skin is warm.    ED Course  Procedures (including critical care time)  Labs Reviewed  CBC - Abnormal; Notable for the following:    MCV 100.9 (*)    All other components within normal limits  DIFFERENTIAL - Abnormal; Notable for the following:    Monocytes Relative 13 (*)    Monocytes Absolute 1.3 (*)    All other components within normal limits  COMPREHENSIVE METABOLIC PANEL - Abnormal; Notable for the following:    Sodium 134 (*)    Glucose, Bld 101 (*)    Albumin 2.9 (*)    GFR calc non Af Amer 55 (*)    GFR calc Af Amer 63 (*)    All other components within normal limits  PROTIME-INR - Abnormal; Notable for the following:    Prothrombin Time 24.8 (*)    INR 2.20 (*)    All other components within normal limits  CARDIAC PANEL(CRET KIN+CKTOT+MB+TROPI)    CARDIAC PANEL(CRET KIN+CKTOT+MB+TROPI)  URINALYSIS, ROUTINE W REFLEX MICROSCOPIC   Dg Chest 2 View  11/29/2011  *RADIOLOGY REPORT*  Clinical Data: Weakness today.  History of hypertension and congestive heart failure.  CHEST - 2 VIEW  Comparison: 11/09/2011 and 12/31/2010.  Findings: There are lower lung volumes with more lordotic positioning on today's frontal examination.  There is mild resulting basilar atelectasis on the frontal  examination.  There is no airspace disease on the lateral view.  Diffuse central airway thickening is again noted.  Heart size and mediastinal contours are stable status post CABG.  Left subclavian pacemaker leads appear unchanged.  A lower thoracic compression deformity appears unchanged.  IMPRESSION: Stable chronic central airway thickening.  No acute cardiopulmonary process identified.  Original Report Authenticated By: Gerrianne Scale, M.D.     1. Orthostatic hypotension   2. Weakness       MDM  Generalized weakness, recent hospitilazation for LE edema and hypokalemia.  Left ECF early per family.  No focal deficits, chest pain, fever, SOB, cough.  Concern for infectious v metabolic v deconditioning. History of hypokalemia. LE edema improving per family and not acute infected.  +orthostatics.  Unsteady gait with ambulation.  Continue IVF, admit for further PT eval and probable further rehab.  Family concerned he left ECF too early.    Date: 11/29/2011  Rate: 77  Rhythm: atrial fibrillation  QRS Axis: left  Intervals: normal  ST/T Wave abnormalities: nonspecific ST/T changes  Conduction Disutrbances:left bundle branch block  Narrative Interpretation: Stable T wave inversions bilaterally  Old EKG Reviewed: changes noted    Glynn Octave, MD 11/29/11 1806

## 2011-11-29 NOTE — ED Notes (Signed)
Pt was d/c'd from [facility] yesterday for rehab related to leg weakness associated with sores on legs/feet.  Pt states "I was feeling better today.  I went to church and this afternoon I started feeling real weak".  Pt denies fever, N/V/D.

## 2011-11-29 NOTE — ED Notes (Signed)
Received report assumed patient care walking rounds completed. 

## 2011-11-29 NOTE — Progress Notes (Signed)
ANTICOAGULATION CONSULT NOTE - Initial Consult  Pharmacy Consult for Warfarin Indication: atrial fibrillation  Allergies  Allergen Reactions  . Azithromycin Other (See Comments)    unknown  . Codeine Other (See Comments)    unknown  . Digoxin Other (See Comments)    unknown  . Indomethacin Other (See Comments)    unknown  . Lorazepam Other (See Comments)    unknown  . Amlodipine Besylate Rash  . Sulfonamide Derivatives Rash      Vital Signs: Temp: 98 F (36.7 C) (01/20 2104) Temp src: Oral (01/20 2104) BP: 104/41 mmHg (01/20 2057) Pulse Rate: 71  (01/20 2057)  Labs:  Basename 11/29/11 1605 11/29/11 1427  HGB -- 14.4  HCT -- 44.0  PLT -- 173  APTT -- --  LABPROT -- 24.8*  INR -- 2.20*  HEPARINUNFRC -- --  CREATININE -- 1.17  CKTOTAL 56 52  CKMB 3.5 3.6  TROPONINI <0.30 <0.30   The CrCl is unknown because both a height and weight (above a minimum accepted value) are required for this calculation.  Medical History: Past Medical History  Diagnosis Date  . Hypertension   . CHF (congestive heart failure)   . Arrhythmia     afib  . Neuropathy   . Cancer     Medications:  Prescriptions prior to admission  Medication Sig Dispense Refill  . acetaminophen (TYLENOL) 500 MG tablet Take 1,000 mg by mouth at bedtime.       Marland Kitchen allopurinol (ZYLOPRIM) 300 MG tablet Take 300 mg by mouth daily.       Marland Kitchen ALPRAZolam (XANAX) 0.5 MG tablet Take 0.5 mg by mouth at bedtime as needed. For sleep      . aspirin 81 MG tablet Take 81 mg by mouth every other day.       Marland Kitchen atenolol (TENORMIN) 50 MG tablet Take 50 mg by mouth daily.       . cholecalciferol (VITAMIN D) 1000 UNITS tablet Take 1,000 Units by mouth daily.       . colchicine 0.6 MG tablet Take 0.6 mg by mouth 2 (two) times daily. colcrys - for gout      . esomeprazole (NEXIUM) 40 MG capsule Take 40 mg by mouth as needed. For acid reflux      . furosemide (LASIX) 80 MG tablet Take 80 mg by mouth 2 (two) times daily.      Marland Kitchen  gabapentin (NEURONTIN) 100 MG capsule Take 100 mg by mouth 3 (three) times daily.       Marland Kitchen lisinopril (PRINIVIL,ZESTRIL) 5 MG tablet Take 5 mg by mouth daily.      . meclizine (ANTIVERT) 12.5 MG tablet Take 6.25 mg by mouth 3 (three) times daily as needed. For dizziness       . metolazone (ZAROXOLYN) 2.5 MG tablet Take 2.5 mg by mouth daily as needed. 30 minutes before furosemide when needed for severe swelling      . potassium chloride SA (K-DUR,KLOR-CON) 20 MEQ tablet Take 20 mEq by mouth daily.      . traMADol (ULTRAM) 50 MG tablet Take 50 mg by mouth every 6 (six) hours as needed. For pain      . warfarin (COUMADIN) 2.5 MG tablet Take 2.5 mg by mouth daily.      . nitroGLYCERIN (NITROSTAT) 0.4 MG SL tablet Place 0.4 mg under the tongue every 5 (five) minutes as needed. For chest pain      . oxyCODONE-acetaminophen (PERCOCET) 5-325 MG per tablet Take  2 tablets by mouth every 6 (six) hours as needed.  30 tablet  0  . predniSONE (DELTASONE) 20 MG tablet Take 2 tablets (40 mg total) by mouth 2 (two) times daily with a meal.  12 tablet  0  . traMADol (ULTRAM) 50 MG tablet Take 1 tablet (50 mg total) by mouth every 6 (six) hours as needed.  30 tablet  0    Assessment: 78 year old man on chronic warfarin therapy for a.fib to continue on warfarin while hospitalized.  Home dose was 2.5 mg daily.  INR on admission is 2.2 which is at goal. Goal of Therapy:  INR 2-3   Plan:  Continue home dose of 2.5mg  daily. Daily INR.  Mickeal Skinner 11/29/2011,9:19 PM

## 2011-11-29 NOTE — H&P (Addendum)
PCP:  Lillia Mountain, MD, MD   DOA:  11/29/2011  1:54 PM  Chief Complaint:  generalized Weakness since 1 day  HPI: 78 year old male with history of  diastolic dysfunction,  Coronary artery disease,  Left leg cellulitis with recent hospitalization, atrial fibrillation on Coumadin,  Chronic venous insufficiency,  Hypertension, history of pacemaker, History of non-Hodgkin's lymphoma in remission was recently hospitalized for left leg cellulitis in the setting of chronic venous stasis and was treated with IV antibiotics and was discharged to past and placed for rehabilitation. Patient was discharged from asked to place after staying there for 10 days yesterday. This morning he went to the church and after returning home he went to the bathroom and on attempting to stand up from the commode he was extremely weak and unable to get up. He informs feeling weak since this morning. Per the son present at bedside he was getting physical therapy at the nursing home, but was in a hurry to home since that time he was discharged there. He denies any dizziness headache or blurring of vision. Denies any chest pain palpitations or shortness of breath. Felt that his knees gave away and felt extremely weak all over the body. Informs having a good appetite, denies any bowel or urinary symptoms. He informs getting some dressing over the left leg wound at the nursing home and was supposed to visit the wound care clinic after being discharged from there. On my evaluation he feels slightly better. As per the ED he was noted to be mildly orthostatic on sitting up and was given some IV fluids.   Allergies: Allergies  Allergen Reactions  . Azithromycin Other (See Comments)    unknown  . Codeine Other (See Comments)    unknown  . Digoxin Other (See Comments)    unknown  . Indomethacin Other (See Comments)    unknown  . Lorazepam Other (See Comments)    unknown  . Amlodipine Besylate Rash  . Sulfonamide  Derivatives Rash    Prior to Admission medications   Medication Sig Start Date End Date Taking? Authorizing Provider  acetaminophen (TYLENOL) 500 MG tablet Take 1,000 mg by mouth at bedtime.    Yes Historical Provider, MD  allopurinol (ZYLOPRIM) 300 MG tablet Take 300 mg by mouth daily.    Yes Historical Provider, MD  ALPRAZolam Prudy Feeler) 0.5 MG tablet Take 0.5 mg by mouth at bedtime as needed. For sleep   Yes Historical Provider, MD  aspirin 81 MG tablet Take 81 mg by mouth every other day.    Yes Historical Provider, MD  atenolol (TENORMIN) 50 MG tablet Take 50 mg by mouth daily.    Yes Historical Provider, MD  cholecalciferol (VITAMIN D) 1000 UNITS tablet Take 1,000 Units by mouth daily.    Yes Historical Provider, MD  colchicine 0.6 MG tablet Take 0.6 mg by mouth 2 (two) times daily. colcrys - for gout 11/18/11 11/21/12 Yes Lillia Mountain, MD  esomeprazole (NEXIUM) 40 MG capsule Take 40 mg by mouth as needed. For acid reflux   Yes Historical Provider, MD  furosemide (LASIX) 80 MG tablet Take 80 mg by mouth 2 (two) times daily. 11/18/11 11/17/12 Yes Lillia Mountain, MD  gabapentin (NEURONTIN) 100 MG capsule Take 100 mg by mouth 3 (three) times daily.    Yes Historical Provider, MD  lisinopril (PRINIVIL,ZESTRIL) 5 MG tablet Take 5 mg by mouth daily.   Yes Historical Provider, MD  meclizine (ANTIVERT) 12.5 MG tablet Take 6.25 mg by  mouth 3 (three) times daily as needed. For dizziness    Yes Historical Provider, MD  metolazone (ZAROXOLYN) 2.5 MG tablet Take 2.5 mg by mouth daily as needed. 30 minutes before furosemide when needed for severe swelling   Yes Historical Provider, MD  potassium chloride SA (K-DUR,KLOR-CON) 20 MEQ tablet Take 20 mEq by mouth daily.   Yes Historical Provider, MD  traMADol (ULTRAM) 50 MG tablet Take 50 mg by mouth every 6 (six) hours as needed. For pain   Yes Historical Provider, MD  warfarin (COUMADIN) 2.5 MG tablet Take 2.5 mg by mouth daily.   Yes Historical  Provider, MD  nitroGLYCERIN (NITROSTAT) 0.4 MG SL tablet Place 0.4 mg under the tongue every 5 (five) minutes as needed. For chest pain    Historical Provider, MD  oxyCODONE-acetaminophen (PERCOCET) 5-325 MG per tablet Take 2 tablets by mouth every 6 (six) hours as needed. 11/18/11 11/28/11  Lillia Mountain, MD  predniSONE (DELTASONE) 20 MG tablet Take 2 tablets (40 mg total) by mouth 2 (two) times daily with a meal. 11/18/11 11/28/11  Lillia Mountain, MD  traMADol (ULTRAM) 50 MG tablet Take 1 tablet (50 mg total) by mouth every 6 (six) hours as needed. 11/18/11 11/28/11  Lillia Mountain, MD    Past Medical History  Diagnosis Date  . Hypertension   . CHF (congestive heart failure)   . Arrhythmia     afib  . Neuropathy   . Cancer     Past Surgical History  Procedure Date  . Pacemaker insertion   . Cardiac surgery     Social History:  reports that he quit smoking about 67 years ago. His smoking use included Cigarettes. He has a 15 pack-year smoking history. He does not have any smokeless tobacco history on file. He reports that he does not drink alcohol or use illicit drugs.  No family history on file.  Review of Systems:  Constitutional: Generalized weakness and fatigue, denies fever, chills, diaphoresis, appetite change  HEENT: Denies photophobia, eye pain, redness, hearing loss, ear pain, congestion, sore throat, rhinorrhea, sneezing, mouth sores, trouble swallowing, neck pain, neck stiffness and tinnitus.   Respiratory: Denies SOB, DOE, cough, chest tightness,  and wheezing.   Cardiovascular: Denies chest pain, palpitations and leg swelling.  Gastrointestinal: Denies nausea, vomiting, abdominal pain, diarrhea, constipation, blood in stool and abdominal distention.  Genitourinary: Denies dysuria, urgency, frequency, hematuria, flank pain and difficulty urinating.  Musculoskeletal: Denies myalgias, back pain, joint swelling, arthralgias and gait problem. walks with assistance    Skin: Denies pallor, rash and wound.  Neurological: Denies dizziness, seizures, syncope,  light-headedness, numbness and headaches.  Hematological: Denies adenopathy. Easy bruising, personal or family bleeding history  Psychiatric/Behavioral: Denies suicidal ideation, mood changes, confusion, nervousness, sleep disturbance and agitation   Physical Exam:  Filed Vitals:   11/29/11 1521 11/29/11 1654 11/29/11 1657 11/29/11 1701  BP: 106/62 103/57 108/67 115/54  Pulse: 70 75 77 73  Temp:  97 F (36.1 C) 97 F (36.1 C) 97.2 F (36.2 C)  TempSrc:  Oral Oral Oral  Resp:  17 16 19   SpO2: 98% 97% 99% 99%    Constitutional: Vital signs reviewed.  Patient is a well-developed and well-nourished in no acute distress and cooperative with exam  HEENT: no pallor, no icterus, moist oral mucosa, no JVD Cardiovascular: RRR, S1 normal, S2 normal, no MRG, pulses symmetric and intact bilaterally Pulmonary/Chest: Bilateral equal air entry,  coarse bibasilar crackles Abdominal: Soft. Non-tender, non-distended, bowel sounds  are normal, no masses, organomegaly, or guarding present.  GU: no CVA tenderness Musculoskeletal: No joint deformities, erythema, or stiffness, ROM full and no nontender Ext: chronic venous stasis changes over b/l leg ( L>R) with superficial ulceration over the left shin which appears to be somewhat uncahged since the time he weas discharged from the hospital as per son. Tenderness to palpation which as per apteint is chronic. bsmall blisters over right calf Hematology: no cervical, inginal, or axillary adenopathy.  Neurological: A&O x3, Strenght is normal and symmetric bilaterally, cranial nerve II-XII are grossly intact, no focal motor deficit, sensory intact to light touch bilaterally.  Skin: Warm, dry and intact. No rash, cyanosis, or clubbing.  Psychiatric: Normal mood and affect. speech and behavior is normal. Judgment and thought content normal. Cognition and memory are normal.    Labs on Admission:  Results for orders placed during the hospital encounter of 11/29/11 (from the past 48 hour(s))  CBC     Status: Abnormal   Collection Time   11/29/11  2:27 PM      Component Value Range Comment   WBC 9.8  4.0 - 10.5 (K/uL)    RBC 4.36  4.22 - 5.81 (MIL/uL)    Hemoglobin 14.4  13.0 - 17.0 (g/dL)    HCT 16.1  09.6 - 04.5 (%)    MCV 100.9 (*) 78.0 - 100.0 (fL)    MCH 33.0  26.0 - 34.0 (pg)    MCHC 32.7  30.0 - 36.0 (g/dL)    RDW 40.9  81.1 - 91.4 (%)    Platelets 173  150 - 400 (K/uL)   DIFFERENTIAL     Status: Abnormal   Collection Time   11/29/11  2:27 PM      Component Value Range Comment   Neutrophils Relative 65  43 - 77 (%)    Neutro Abs 6.4  1.7 - 7.7 (K/uL)    Lymphocytes Relative 20  12 - 46 (%)    Lymphs Abs 2.0  0.7 - 4.0 (K/uL)    Monocytes Relative 13 (*) 3 - 12 (%)    Monocytes Absolute 1.3 (*) 0.1 - 1.0 (K/uL)    Eosinophils Relative 1  0 - 5 (%)    Eosinophils Absolute 0.1  0.0 - 0.7 (K/uL)    Basophils Relative 0  0 - 1 (%)    Basophils Absolute 0.0  0.0 - 0.1 (K/uL)   COMPREHENSIVE METABOLIC PANEL     Status: Abnormal   Collection Time   11/29/11  2:27 PM      Component Value Range Comment   Sodium 134 (*) 135 - 145 (mEq/L)    Potassium 4.2  3.5 - 5.1 (mEq/L)    Chloride 98  96 - 112 (mEq/L)    CO2 29  19 - 32 (mEq/L)    Glucose, Bld 101 (*) 70 - 99 (mg/dL)    BUN 23  6 - 23 (mg/dL)    Creatinine, Ser 7.82  0.50 - 1.35 (mg/dL)    Calcium 9.8  8.4 - 10.5 (mg/dL)    Total Protein 6.3  6.0 - 8.3 (g/dL)    Albumin 2.9 (*) 3.5 - 5.2 (g/dL)    AST 21  0 - 37 (U/L)    ALT 24  0 - 53 (U/L)    Alkaline Phosphatase 69  39 - 117 (U/L)    Total Bilirubin 0.8  0.3 - 1.2 (mg/dL)    GFR calc non Af Amer 55 (*) >90 (mL/min)  GFR calc Af Amer 63 (*) >90 (mL/min)   PROTIME-INR     Status: Abnormal   Collection Time   11/29/11  2:27 PM      Component Value Range Comment   Prothrombin Time 24.8 (*) 11.6 - 15.2 (seconds)    INR 2.20 (*) 0.00 -  1.49    CARDIAC PANEL(CRET KIN+CKTOT+MB+TROPI)     Status: Normal   Collection Time   11/29/11  2:27 PM      Component Value Range Comment   Total CK 52  7 - 232 (U/L)    CK, MB 3.6  0.3 - 4.0 (ng/mL)    Troponin I <0.30  <0.30 (ng/mL)    Relative Index RELATIVE INDEX IS INVALID  0.0 - 2.5    CARDIAC PANEL(CRET KIN+CKTOT+MB+TROPI)     Status: Normal   Collection Time   11/29/11  4:05 PM      Component Value Range Comment   Total CK 56  7 - 232 (U/L)    CK, MB 3.5  0.3 - 4.0 (ng/mL)    Troponin I <0.30  <0.30 (ng/mL)    Relative Index RELATIVE INDEX IS INVALID  0.0 - 2.5      Radiological Exams on Admission: CHEST - 2 VIEW  Comparison: 11/09/2011 and 12/31/2010.  Findings: There are lower lung volumes with more lordotic  positioning on today's frontal examination. There is mild  resulting basilar atelectasis on the frontal examination. There is  no airspace disease on the lateral view. Diffuse central airway  thickening is again noted. Heart size and mediastinal contours are  stable status post CABG. Left subclavian pacemaker leads appear  unchanged. A lower thoracic compression deformity appears  unchanged.  IMPRESSION:  Stable chronic central airway thickening. No acute cardiopulmonary  process identified.    EKG: NSR@77 , chronic LBBBB  Assessment/Plan 78 year old male with history of  diastolic dysfunction,  Coronary artery disease,  Left leg cellulitis with recent hospitalization, atrial fibrillation on Coumadin,  Chronic venous insufficiency,  Hypertension, history of pacemaker, History of non-Hodgkin's lymphoma in remission was recently hospitalized for left leg cellulitis in the setting of chronic venous stasis and was treated with IV antibiotics and was discharged to past and placed for rehabilitation presented from home with symptoms of generalized weakness and fatigue.      *Generalized weakness Patient does not have any focal neurological weakness and it appears  that his weakness is ongoing since the last admission. He ws actually admitted for the  same reason and sent to rehab after being treated here. Will admit to medical floor Patient noted to be mildly orthostatic per ED and given IV fluids, however do not have orthostatic vitals records in the system Patient is on quite a high dose of lasix for his diastolic dysfn and leg edema and will reduce it to half the dose . His weakness could be contributed due to dehydration with lasix as well Patient will need further PT for his weakness and would likely benefit from a rehab again.    Stasis leg ulcer Appears chronic and as per son has not changed mch from his recent discharge. There is sone serous discharge from the left ulcer but doesnot have any foul smell or purulence. He was treated empirically with antibiotics recently Will get wound care consult for evaluation and recommendation   Coronary artery disease Stable  Cont ASA   Atrial fibrillation Cont coumadin, INR therapeutic   Chronic diastolic heart failure Appears euvolemic  will reduce dose  of lasix  has chr pacemaker   Neuropathy  cont gabapentin  Hx of Gout  cont allopurinol and colchicine    DVT prophylaxis: on systemic AC  Diet: cardiac  Code status: limited , wishes not to have mechanical ventilation but ok with CPR and chemical resuscitation  Patient requests his PCP be informed of his admission. Admission H&P forwarded. Please call PCP office to notify tomorrow.  Time Spent on Admission: 45 MINUTES  Khaliyah Northrop 11/29/2011, 6:22 PM

## 2011-11-30 MED ORDER — SODIUM CHLORIDE 0.9 % IV SOLN
250.0000 mL | INTRAVENOUS | Status: DC | PRN
  Administered 2011-11-30 – 2011-12-01 (×2): 250 mL via INTRAVENOUS

## 2011-11-30 NOTE — Progress Notes (Signed)
Physical Therapy Evaluation Patient Details Name: [name] MRN: [id number] DOB: [birth year] Today's Date: 11/30/2011  Problem List:  Patient Active Problem List  Diagnoses  . Atrial fibrillation  . Chronic diastolic heart failure  . Cardiac pacemaker in situ  . Coronary artery disease  . Stasis leg ulcer  . Neuropathy  . Generalized weakness    Past Medical History:  Past Medical History  Diagnosis Date  . Hypertension   . CHF (congestive heart failure)   . Arrhythmia     afib  . Neuropathy   . Cancer    Past Surgical History:  Past Surgical History  Procedure Date  . Pacemaker insertion   . Cardiac surgery   . Eye surgery   . Back and knee surgeries unknown    PT Assessment/Plan/Recommendation PT Assessment Clinical Impression Statement: Patient presents with significant generalized muscle weakness, decreased bilat LE power and eccentric control, delayed balance and righting reactions with posterior lean, and decreased graded weight shifts in all planes resulting in increased risk of fall and decreased functional independence. Acute PT to follow at a frequency of 3x/week during acute hospital stay. Spoke with patient and son at length re: follow up therapies in either CIR or SNF setting prior to return home with intermittent family assist. Both patient and son are interested in pursiung CIR consult. If not accepted to CIR, patient and son would like patient to go to SNF at [facility] or [facility] in [place] for further therapies prior to return home. PT Recommendation/Assessment: Patient will need skilled PT in the acute care venue PT Problem List: Decreased strength;Decreased range of motion;Decreased activity tolerance;Decreased balance;Decreased mobility;Decreased knowledge of use of DME;Decreased safety awareness Barriers to Discharge: None PT Therapy Diagnosis : Difficulty walking;Generalized weakness PT Plan PT Frequency: Min 3X/week PT  Treatment/Interventions: DME instruction;Gait training;Functional mobility training;Therapeutic activities;Therapeutic exercise;Balance training;Patient/family education PT Recommendation Recommendations for Other Services: Rehab consult;OT consult Follow Up Recommendations: Inpatient Rehab;Skilled nursing facility Equipment Recommended: Defer to next venue PT Goals  Acute Rehab PT Goals PT Goal Formulation: With patient Time For Goal Achievement: 7 days Pt will go Supine/Side to Sit: with supervision PT Goal: Supine/Side to Sit - Progress: Goal set today Pt will go Sit to Supine/Side: with supervision PT Goal: Sit to Supine/Side - Progress: Goal set today Pt will go Sit to Stand: with supervision PT Goal: Sit to Stand - Progress: Goal set today Pt will go Stand to Sit: with supervision PT Goal: Stand to Sit - Progress: Goal set today Pt will Transfer Bed to Chair/Chair to Bed: with min assist PT Transfer Goal: Bed to Chair/Chair to Bed - Progress: Goal set today Pt will Ambulate: 16 - 50 feet;with least restrictive assistive device;with min assist PT Goal: Ambulate - Progress: Goal set today  PT Evaluation Precautions/Restrictions  Precautions Precautions: Fall Precaution Comments: posterior lean Required Braces or Orthoses: No Restrictions Weight Bearing Restrictions: No Other Position/Activity Restrictions: bilat LE compression wrapping Prior Functioning  Home Living Lives With: Alone Receives Help From: Family Type of Home: House Home Layout: One level Home Access: Ramped entrance Bathroom Shower/Tub: Tub/shower unit;Curtain Firefighter: Standard Bathroom Accessibility: Yes How Accessible: Accessible via walker Home Adaptive Equipment: Wheelchair - manual;Tub transfer bench;Straight cane Prior Function Level of Independence: Independent with basic ADLs;Independent with homemaking with ambulation;Independent with gait;Independent with transfers (with use of  RW) Vocation: Retired Financial risk analyst Arousal/Alertness: Awake/alert Overall Cognitive Status: Appears within functional limits for tasks assessed Sensation/Coordination Sensation Light Touch: Appears Intact Stereognosis: Not  tested Hot/Cold: Not tested Proprioception: Appears Intact Additional Comments: sensation testing limited due to bilat LE compression wrapping. Patient reports no sensory changes from baseline, no numbness or tingling. Proprioception assessed at bilat great toes and WNL. Coordination Gross Motor Movements are Fluid and Coordinated: Yes Fine Motor Movements are Fluid and Coordinated: Yes Extremity Assessment RLE Assessment RLE Assessment: Exceptions to [facility] RLE Strength RLE Overall Strength: Deficits RLE Overall Strength Comments: strength grossly 3+/5 with poor eccentric control and decreased muscular endurance LLE Assessment LLE Assessment: Exceptions to [facility] LLE Strength LLE Overall Strength: Deficits LLE Overall Strength Comments: strength grossly 3+/5 with poor eccentric control and decreased muscular endurance Mobility (including Balance) Bed Mobility Supine to Sit: 3: Mod assist Sitting - Scoot to Edge of Bed: 4: Min assist Sitting - Scoot to [place] of Bed Details (indicate cue type and reason): posterior lean Transfers Sit to Stand: 3: Mod assist;From bed Stand to Sit: 3: Mod assist;To chair/3-in-1 Stand Pivot Transfers: 2: Max Actuary Details (indicate cue type and reason): assist to lift and to lower, decreased graded bilat weight shifts and posterior lean noted. Ambulation/Gait Ambulation/Gait: Yes Ambulation/Gait Assistance: 2: Max assist Ambulation Distance (Feet): 15 Feet (x 2 trials) Assistive device: Other (Comment) (left hand held assist) Gait Pattern: Step-to pattern;Shuffle;Antalgic (wide BOS) Gait velocity: significantly decreased Stairs: No Wheelchair Mobility Wheelchair Mobility: No  Static Sitting  Balance Static Sitting - Balance Support: No upper extremity supported;Feet supported Static Sitting - Level of Assistance: 4: Min assist Static Sitting - Comment/# of Minutes: posterior lean Exercise    End of Session PT - End of Session Equipment Utilized During Treatment: Gait belt Activity Tolerance: Patient tolerated treatment well Patient left: in chair;with call bell in reach;with family/visitor present Nurse Communication: Mobility status for transfers;Mobility status for ambulation General Behavior During Session: [facility] for tasks performed Cognition: [facility] for tasks performed  Romeo Rabon 11/30/2011, 12:36 PM

## 2011-11-30 NOTE — Progress Notes (Signed)
Subjective:  Patient relates still week.  Objective: Filed Vitals:   11/29/11 2200 11/30/11 0620 11/30/11 1034 11/30/11 1351  BP: 103/64 98/60 122/69 113/62  Pulse: 71 70 79 72  Temp: 98.1 F (36.7 C) 97.6 F (36.4 C)  97.5 F (36.4 C)  TempSrc: Oral   Oral  Resp: 20 19 20 20   Height: 5\' 4"  (1.626 m)     Weight: 89.812 kg (198 lb) 92.398 kg (203 lb 11.2 oz)    SpO2: 96% 96% 96% 100%   Weight change:   Intake/Output Summary (Last 24 hours) at 11/30/11 1520 Last data filed at 11/30/11 1300  Gross per 24 hour  Intake    720 ml  Output    250 ml  Net    470 ml    General: Alert, awake, oriented x3, in no acute distress.  HEENT: No bruits, no goiter.  Neg JVD Heart: Regular rate and rhythm, without murmurs, rubs, gallops.  Lungs: Crackles left side, bilateral air movement.  Abdomen: Soft, nontender, nondistended, positive bowel sounds.  Neuro: Grossly intact, nonfocal.   Lab Results:  [facility] 11/29/11 1427  NA 134*  K 4.2  CL 98  CO2 29  GLUCOSE 101*  BUN 23  CREATININE 1.17  CALCIUM 9.8  MG --  PHOS --    Basename 11/29/11 1427  AST 21  ALT 24  ALKPHOS 69  BILITOT 0.8  PROT 6.3  ALBUMIN 2.9*    Basename 11/29/11 1427  WBC 9.8  NEUTROABS 6.4  HGB 14.4  HCT 44.0  MCV 100.9*  PLT 173    Basename 11/29/11 1605 11/29/11 1427  CKTOTAL 56 52  CKMB 3.5 3.6  CKMBINDEX -- --  TROPONINI <0.30 <0.30   Micro Results: No results found for this or any previous visit (from the past 240 hour(s)).  Studies/Results: Dg Chest 2 View  11/29/2011  *RADIOLOGY REPORT*  Clinical Data: Weakness today.  History of hypertension and congestive heart failure.  CHEST - 2 VIEW  Comparison: 11/09/2011 and 12/31/2010.  Findings: There are lower lung volumes with more lordotic positioning on today's frontal examination.  There is mild resulting basilar atelectasis on the frontal examination.  There is no airspace disease on the lateral view.  Diffuse central airway  thickening is again noted.  Heart size and mediastinal contours are stable status post CABG.  Left subclavian pacemaker leads appear unchanged.  A lower thoracic compression deformity appears unchanged.  IMPRESSION: Stable chronic central airway thickening.  No acute cardiopulmonary process identified.  Original Report Authenticated By: Gerrianne Scale, M.D.    Medications: I have reviewed the patient's current medications.  Assessment and plan:  1.Generalized weakness: -Most likely 2/2 to decrease intravascular depletion his chloride in less than 100. Start IV fluids, check b-met. continue to hold lasix. -start IV fluids. -SW for SNF placement.  2. Atrial fibrillation: -Rate control, Thx INR  3.Chronic diastolic heart failure: -Beta-blocker/lasix/ace. BP stable. -hold lasix, will need lower dose as an outpatient.  5. Coronary artery disease: -asa.  6. Stasis leg ulcer: Wound care.  7. Neuropathy:  neurontin.     LOS: 1 day   Marinda Elk M.D. Pager: [phone] [facility] 11/30/2011, 3:20 PM

## 2011-11-30 NOTE — Progress Notes (Signed)
ANTICOAGULATION CONSULT NOTE - Follow Up Consult  Pharmacy Consult for coumadin Indication: atrial fibrillation  Vital Signs: Temp: 97.6 F (36.4 C) (01/21 0620) Temp src: Oral (01/20 2200) BP: 98/60 mmHg (01/21 0620) Pulse Rate: 70  (01/21 0620)  Labs:  Basename 11/30/11 0555 11/29/11 1605 11/29/11 1427  HGB -- -- 14.4  HCT -- -- 44.0  PLT -- -- 173  APTT -- -- --  LABPROT 26.0* -- 24.8*  INR 2.34* -- 2.20*  HEPARINUNFRC -- -- --  CREATININE -- -- 1.17  CKTOTAL -- 56 52  CKMB -- 3.5 3.6  TROPONINI -- <0.30 <0.30   Estimated Creatinine Clearance: 46.5 ml/min (by C-G formula based on Cr of 1.17).  Assessment: 88 yom on coumadin PTA for atrial fibrillation. INR today remains therapeutic at 2.34. No bleeding noted. Pt on home regimen of 2.5mg  PO daily.   Goal of Therapy:  INR 2-3   Plan:  1. Continue coumadin 2.5mg  PO daily 2. F/u AM INR  Tereza Gilham, Drake Leach 11/30/2011,9:03 AM

## 2011-11-30 NOTE — Consult Note (Signed)
WOC consult Note Reason for Consult: left leg with chronic stasis ulcers and recent abrasion to left inner foot. Wound type: Partial thickness Wound bed: inner foot 1X1X.1cm pink dry abrasion, pt states he bumped it on a walker prior to admission.  Left lower calf with patchy area of stasis ulcer 6X10X.1cm, dark purple wound bed, small yellow drainage, no odor.  Pt has used Profore leg wraps prior to admission to control generalized edema.  Right leg with generalized edema but no open wounds. Dressing procedure/placement/frequency: Bilat profore wraps applied to promote healing and control edema.  Plan to change next Monday if still inpatient.  Willl need to be changed weekly after d/c.   Cammie Mcgee, RN, MSN, [employer]  [phone]

## 2011-12-01 LAB — BASIC METABOLIC PANEL
CO2: 30 mEq/L (ref 19–32)
Calcium: 9 mg/dL (ref 8.4–10.5)
Creatinine, Ser: 1.01 mg/dL (ref 0.50–1.35)
Glucose, Bld: 100 mg/dL — ABNORMAL HIGH (ref 70–99)

## 2011-12-01 MED ORDER — COLCHICINE 0.6 MG PO TABS
0.6000 mg | ORAL_TABLET | Freq: Every day | ORAL | Status: DC
  Administered 2011-12-01 – 2011-12-03 (×3): 0.6 mg via ORAL
  Filled 2011-12-01 (×3): qty 1

## 2011-12-01 MED ORDER — FUROSEMIDE 40 MG PO TABS
40.0000 mg | ORAL_TABLET | Freq: Every day | ORAL | Status: DC
  Administered 2011-12-01 – 2011-12-03 (×3): 40 mg via ORAL
  Filled 2011-12-01 (×3): qty 1

## 2011-12-01 MED ORDER — POTASSIUM CHLORIDE CRYS ER 20 MEQ PO TBCR
20.0000 meq | EXTENDED_RELEASE_TABLET | Freq: Three times a day (TID) | ORAL | Status: DC
  Administered 2011-12-01 (×2): 20 meq via ORAL
  Filled 2011-12-01 (×6): qty 1

## 2011-12-01 NOTE — Progress Notes (Signed)
ANTICOAGULATION CONSULT NOTE - Follow Up Consult  Pharmacy Consult for Coumadin Indication: atrial fibrillation  Assessment: 78 yo M on Coumadin PTA for atrial fibrillation. INR today remains therapeutic. No bleeding noted. Pt on home regimen of 2.5 mg PO daily.   Goal of Therapy:  INR 2-3   Plan:  1. Continue Coumadin 2.5 mg PO daily 2. F/u AM INR   Vital Signs: Temp: 97.7 F (36.5 C) (01/22 0520) Temp src: Axillary (01/21 2134) BP: 132/80 mmHg (01/22 0520) Pulse Rate: 78  (01/22 0520)  Labs:  Basename 12/01/11 0605 11/30/11 0555 11/29/11 1605 11/29/11 1427  HGB -- -- -- 14.4  HCT -- -- -- 44.0  PLT -- -- -- 173  APTT -- -- -- --  LABPROT 31.3* 26.0* -- 24.8*  INR 2.96* 2.34* -- 2.20*  HEPARINUNFRC -- -- -- --  CREATININE 1.01 -- -- 1.17  CKTOTAL -- -- 56 52  CKMB -- -- 3.5 3.6  TROPONINI -- -- <0.30 <0.30   Estimated Creatinine Clearance: 53.8 ml/min (by C-G formula based on Cr of 1.01).   [name], [name] 12/01/2011,8:50 AM

## 2011-12-01 NOTE — Progress Notes (Signed)
Physical Therapy Treatment Patient Details Name: OLIVERIO CHO MRN: [id number] DOB: [birth year] Today's Date: 12/01/2011  PT Assessment/Plan  PT - Assessment/Plan Comments on Treatment Session: Pt continuing to progress well.  PT Plan: Discharge plan remains appropriate PT Frequency: Min 3X/week Follow Up Recommendations: Skilled nursing facility;Inpatient Rehab Equipment Recommended: Defer to next venue PT Goals  Acute Rehab PT Goals PT Goal: Sit to Stand - Progress: Progressing toward goal PT Goal: Stand to Sit - Progress: Progressing toward goal PT Transfer Goal: Bed to Chair/Chair to Bed - Progress: Progressing toward goal PT Goal: Ambulate - Progress: Progressing toward goal  PT Treatment Precautions/Restrictions  Precautions Precautions: Fall Precaution Comments: posterior lean Required Braces or Orthoses: No Restrictions Weight Bearing Restrictions: No Other Position/Activity Restrictions: bilat. LE compression wrapping Mobility (including Balance) Transfers Sit to Stand: 4: Min assist;From chair/3-in-1;With upper extremity assist;With armrests;Other (comment) (MinGuard A) Sit to Stand Details (indicate cue type and reason): Cues for safe technique Stand to Sit: 4: Min assist;Other (comment);To chair/3-in-1;With armrests;With upper extremity assist (MinGuard A) Stand to Sit Details: Cues to sit slowly Ambulation/Gait Ambulation/Gait Assistance: 4: Min assist Ambulation/Gait Assistance Details (indicate cue type and reason): A for balance.  Assistive device: Rolling walker Gait Pattern: Step-to pattern;Antalgic Gait velocity: decreased    Exercise  General Exercises - Lower Extremity Ankle Circles/Pumps: AROM;Both;10 reps Long Arc Quad: AROM;Both;10 reps Hip Flexion/Marching: AROM;Both;10 reps End of Session PT - End of Session Equipment Utilized During Treatment: Gait belt Activity Tolerance: Patient tolerated treatment well Patient left: in chair;with call  bell in reach Nurse Communication: Mobility status for transfers;Mobility status for ambulation General Behavior During Session: [facility] for tasks performed Cognition: [facility] for tasks performed  Robinette, Adline Potter 12/01/2011, 12:58 PM 12/01/2011 Fredrich Birks PTA [phone] pager [phone] office

## 2011-12-01 NOTE — Progress Notes (Signed)
Subjective: Feels better  Objective: Vital signs in last 24 hours: Temp:  [97.5 F (36.4 C)-97.7 F (36.5 C)] 97.7 F (36.5 C) (01/22 0520) Pulse Rate:  [72-79] 78  (01/22 0520) Resp:  [18-20] 18  (01/22 0520) BP: (113-132)/(61-80) 132/80 mmHg (01/22 0520) SpO2:  [96 %-100 %] 97 % (01/22 0520) Weight change:  Last BM Date: 11/30/11  Intake/Output from previous day: 01/21 0701 - 01/22 0700 In: 1620 [P.O.:720; I.V.:900] Out: 1175 [Urine:1175] Intake/Output this shift:    General appearance: alert and cooperative Resp: clear to auscultation bilaterally Cardio: regular rate and rhythm, S1, S2 normal, no murmur, click, rub or gallop Extremities: compression wraps  Lab Results:  Basename 11/29/11 1427  WBC 9.8  HGB 14.4  HCT 44.0  PLT 173   BMET  Basename 11/29/11 1427  NA 134*  K 4.2  CL 98  CO2 29  GLUCOSE 101*  BUN 23  CREATININE 1.17  CALCIUM 9.8    Studies/Results: Dg Chest 2 View  11/29/2011  *RADIOLOGY REPORT*  Clinical Data: Weakness today.  History of hypertension and congestive heart failure.  CHEST - 2 VIEW  Comparison: 11/09/2011 and 12/31/2010.  Findings: There are lower lung volumes with more lordotic positioning on today's frontal examination.  There is mild resulting basilar atelectasis on the frontal examination.  There is no airspace disease on the lateral view.  Diffuse central airway thickening is again noted.  Heart size and mediastinal contours are stable status post CABG.  Left subclavian pacemaker leads appear unchanged.  A lower thoracic compression deformity appears unchanged.  IMPRESSION: Stable chronic central airway thickening.  No acute cardiopulmonary process identified.  Original Report Authenticated By: Gerrianne Scale, M.D.    Medications: I have reviewed the patient's current medications.  Assessment/Plan: Principal Problem:  *Generalized weakness hypotension part of explanation, minimize lasix and bp meds, PT Active  Problems:  Chronic diastolic heart failure compensated  Coronary artery disease  stable  Stasis leg ulcer  Compression wraps  Disposition  [facility] NHP   LOS: 2 days   [name] JOSEPH 12/01/2011, 7:44 AM

## 2011-12-02 ENCOUNTER — Encounter (HOSPITAL_BASED_OUTPATIENT_CLINIC_OR_DEPARTMENT_OTHER): Payer: Medicare Other

## 2011-12-02 LAB — CBC
HCT: 40.7 % (ref 39.0–52.0)
MCV: 98.5 fL (ref 78.0–100.0)
RDW: 13.9 % (ref 11.5–15.5)
WBC: 7.1 10*3/uL (ref 4.0–10.5)

## 2011-12-02 LAB — PROTIME-INR: INR: 2.77 — ABNORMAL HIGH (ref 0.00–1.49)

## 2011-12-02 LAB — BASIC METABOLIC PANEL
CO2: 31 mEq/L (ref 19–32)
Chloride: 100 mEq/L (ref 96–112)
Creatinine, Ser: 1.03 mg/dL (ref 0.50–1.35)

## 2011-12-02 LAB — DIFFERENTIAL
Basophils Absolute: 0 10*3/uL (ref 0.0–0.1)
Eosinophils Relative: 3 % (ref 0–5)
Lymphocytes Relative: 39 % (ref 12–46)
Lymphs Abs: 2.8 10*3/uL (ref 0.7–4.0)
Monocytes Absolute: 0.8 10*3/uL (ref 0.1–1.0)

## 2011-12-02 MED ORDER — POTASSIUM CHLORIDE CRYS ER 20 MEQ PO TBCR
20.0000 meq | EXTENDED_RELEASE_TABLET | Freq: Two times a day (BID) | ORAL | Status: DC
  Administered 2011-12-02 (×2): 20 meq via ORAL
  Filled 2011-12-02 (×4): qty 1

## 2011-12-02 NOTE — Progress Notes (Signed)
Subjective: No new complaints, felt god ambulating with PT yesterday  Objective: Vital signs in last 24 hours: Temp:  [97.9 F (36.6 C)] 97.9 F (36.6 C) (01/22 2145) Pulse Rate:  [72-89] 87  (01/22 2145) Resp:  [18-20] 18  (01/22 2145) BP: (113-125)/(59-66) 118/65 mmHg (01/22 2145) SpO2:  [98 %-100 %] 100 % (01/22 2145) Weight change:  Last BM Date: 12/01/11  Intake/Output from previous day: 01/22 0701 - 01/23 0700 In: 1360 [P.O.:1260; I.V.:100] Out: 1895 [Urine:1895] Intake/Output this shift:    General appearance: alert Resp: clear to auscultation bilaterally Cardio: regular rate and rhythm, S1, S2 normal, no murmur, click, rub or gallop  Lab Results:  Basename 12/02/11 0502 11/29/11 1427  WBC 7.1 9.8  HGB 13.5 14.4  HCT 40.7 44.0  PLT 163 173   BMET  Basename 12/02/11 0502 12/01/11 0605  NA 141 139  K 3.4* 3.1*  CL 100 100  CO2 31 30  GLUCOSE 103* 100*  BUN 15 18  CREATININE 1.03 1.01  CALCIUM 9.8 9.0    Studies/Results: No results found.  Medications: I have reviewed the patient's current medications.  Assessment/Plan: Principal Problem:  *Generalized weakness  Improving with PT and rehydration Active Problems:  Chronic diastolic heart failure compensated  Coronary artery disease stsable  Stasis leg ulcer  Compression wraps weekly  hypokalemia  Replete po  Disposition  Consider home with home RN/PT and shortterm 24 hour care as alternative to repeat NHP.  Will discuss with PT   LOS: 3 days   [name] JOSEPH 12/02/2011, 7:04 AM

## 2011-12-02 NOTE — Progress Notes (Addendum)
ANTICOAGULATION CONSULT NOTE - Follow Up Consult  Pharmacy Consult for Coumadin Indication: atrial fibrillation  Assessment: 78 yo M on Coumadin PTA for atrial fibrillation. INR today remains therapeutic. No bleeding noted. Pt on home regimen of 2.5 mg PO daily.   Goal of Therapy:  INR 2-3   Plan:  1. Continue Coumadin 2.5 mg PO daily 2. Change INR to MWF   Vital Signs: Temp: 98 F (36.7 C) (01/23 0708) Temp src: Oral (01/23 0708) BP: 140/81 mmHg (01/23 0708) Pulse Rate: 75  (01/23 0708)  Labs:  Alvira Philips 12/02/11 0502 12/01/11 0605 11/30/11 0555 11/29/11 1605 11/29/11 1427  HGB 13.5 -- -- -- 14.4  HCT 40.7 -- -- -- 44.0  PLT 163 -- -- -- 173  APTT -- -- -- -- --  LABPROT 29.7* 31.3* 26.0* -- --  INR 2.77* 2.96* 2.34* -- --  HEPARINUNFRC -- -- -- -- --  CREATININE 1.03 1.01 -- -- 1.17  CKTOTAL -- -- -- 56 52  CKMB -- -- -- 3.5 3.6  TROPONINI -- -- -- <0.30 <0.30   Estimated Creatinine Clearance: 51 ml/min (by C-G formula based on Cr of 1.03).   Loura Back Danielle 12/02/2011,8:21 AM

## 2011-12-02 NOTE — Progress Notes (Signed)
Clinical Social Worker completed the psychosocial assessment which can be found in the shadow chart.  Per PT recommendations, patient would benefit from therapy at a skilled nursing facility.  Patient and family are agreeable to skilled nursing facility placement.  Patient's 1st choice is [facility] and 2nd choice is [facility]	[place].  CSW to initiate SNF search in [place].  Patient and patient family had questions/concerns regarding insurance - CSW addressed concerns and answered family's questions.  Clinical Social Worker to follow up with possible bed offers and facilitate discharge needs once patient medically ready.  Arnette Norris, MSW Intern  Macario Golds, [place] [phone]  (Covering Genelle Bal - please contact [phone] for follow up)

## 2011-12-03 LAB — BASIC METABOLIC PANEL
BUN: 17 mg/dL (ref 6–23)
CO2: 34 mEq/L — ABNORMAL HIGH (ref 19–32)
Calcium: 9.8 mg/dL (ref 8.4–10.5)
Creatinine, Ser: 1.17 mg/dL (ref 0.50–1.35)
Glucose, Bld: 103 mg/dL — ABNORMAL HIGH (ref 70–99)

## 2011-12-03 MED ORDER — COLCHICINE 0.6 MG PO TABS: 0.6000 mg | ORAL_TABLET | Freq: Every day | ORAL | Status: DC

## 2011-12-03 MED ORDER — PREDNISONE 5 MG PO TABS: 5.0000 mg | ORAL_TABLET | Freq: Every day | ORAL | Status: AC

## 2011-12-03 MED ORDER — FUROSEMIDE 40 MG PO TABS: 40.0000 mg | ORAL_TABLET | Freq: Every day | ORAL | Status: DC

## 2011-12-03 MED ORDER — POTASSIUM CHLORIDE CRYS ER 20 MEQ PO TBCR
20.0000 meq | EXTENDED_RELEASE_TABLET | Freq: Every day | ORAL | Status: DC
  Administered 2011-12-03: 20 meq via ORAL
  Filled 2011-12-03: qty 1

## 2011-12-03 NOTE — Progress Notes (Signed)
Pt evaluated for long term disease management services with [facility] program as a benefit of [employer]. RN case manager will continue to follow at the SNF for progression. A [facility] RN case manager will do a post d/c transition of care call at the time of the SNF d/c to assess the need for home visits with the program.   Brooke Bonito C. Roena Malady, RN, MS, CCM [facility] Liaison [facility] [phone]

## 2011-12-03 NOTE — Progress Notes (Signed)
Subjective: No new complaints  Objective: Vital signs in last 24 hours: Temp:  [97.2 F (36.2 C)-99.7 F (37.6 C)] 97.2 F (36.2 C) (01/24 0550) Pulse Rate:  [70-85] 70  (01/24 0550) Resp:  [18-20] 18  (01/24 0550) BP: (110-129)/(55-75) 125/61 mmHg (01/24 0550) SpO2:  [97 %-98 %] 98 % (01/24 0550) Weight:  [85.186 kg (187 lb 12.8 oz)] 85.186 kg (187 lb 12.8 oz) (01/24 0500) Weight change:  Last BM Date: 12/01/11  Intake/Output from previous day: 01/23 0701 - 01/24 0700 In: 981 [P.O.:480; I.V.:501] Out: 950 [Urine:950] Intake/Output this shift:    Resp: clear to auscultation bilaterally Cardio: regular rate and rhythm, S1, S2 normal, no murmur, click, rub or gallop  Lab Results:  Basename 12/02/11 0502  WBC 7.1  HGB 13.5  HCT 40.7  PLT 163   BMET  Basename 12/03/11 0526 12/02/11 0502  NA 141 141  K 3.6 3.4*  CL 102 100  CO2 34* 31  GLUCOSE 103* 103*  BUN 17 15  CREATININE 1.17 1.03  CALCIUM 9.8 9.8    Studies/Results: No results found.  Medications: I have reviewed the patient's current medications.  Assessment/Plan: Principal Problem:  *Generalized weakness improved Active Problems:  Atrial fibrillation  Chronic diastolic heart failure stable  Stasis leg ulcer compression wrap weekly  Neuropathy  Ready for NHP   LOS: 4 days   Sloan Galentine JOSEPH 12/03/2011, 7:16 AM

## 2011-12-03 NOTE — Progress Notes (Signed)
Clinical Social Worker talked with [name] in admissions at [facility] regarding patient's discharge date. Patient has a bed at [facility] and can come when ready. FL-2 completed and sent to SNF with other clinical information. CSW also sent clinical information to [facility] for pre-authorization.  Genelle Bal, MSW, LCSW [phone]

## 2011-12-03 NOTE — Progress Notes (Signed)
Patient discharged to [facility]. Transported by family. Packet of information given to family to give to RN at [facility]. IV d/c'd with cath intact. Assessment unchanged from this am. Patient is to follow up with Dr. Valentina Lucks in 4 weeks.

## 2011-12-03 NOTE — Progress Notes (Signed)
Physical Therapy Treatment Patient Details Name: [name] MRN: [id number] DOB: Dec 06, 1922 Today's Date: 12/03/2011  PT Assessment/Plan  PT - Assessment/Plan Comments on Treatment Session: Pt continues to make good progress. Still with balance issues, mainly posterior LOB. Patient is requesting to go back to [facility] before going home as he lives alone PT Plan: Discharge plan remains appropriate PT Frequency: Min 3X/week Follow Up Recommendations: Skilled nursing facility Equipment Recommended: Defer to next venue PT Goals  Acute Rehab PT Goals PT Goal: Sit to Stand - Progress: Progressing toward goal PT Goal: Stand to Sit - Progress: Progressing toward goal PT Goal: Ambulate - Progress: Progressing toward goal  PT Treatment Precautions/Restrictions  Precautions Precautions: Fall Precaution Comments: continues to have slight lean Required Braces or Orthoses: No Restrictions Weight Bearing Restrictions: No Other Position/Activity Restrictions: bilat. LE compression wrapping Mobility (including Balance) Bed Mobility Bed Mobility: No Transfers Sit to Stand: Other (comment) (MinGuard A) Sit to Stand Details (indicate cue type and reason): Cues for safe technique Stand to Sit: Other (comment) (MinGuard A) Stand to Sit Details: cues for safe hand placement and to control descent into chair Ambulation/Gait Ambulation/Gait Assistance: 4: Min assist Ambulation/Gait Assistance Details (indicate cue type and reason): A for balance and cues for safe management of RW Ambulation Distance (Feet): 110 Feet (x2) Assistive device: Rolling walker Gait Pattern: Step-to pattern;Decreased stance time - left;Decreased stance time - right;Decreased hip/knee flexion - left;Decreased hip/knee flexion - right;Trunk flexed  Posture/Postural Control Posture/Postural Control: No significant limitations Dynamic Standing Balance Dynamic Standing - Balance Support: No upper extremity  supported Dynamic Standing - Level of Assistance: 4: Min assist Dynamic Standing - Balance Activities: Lateral lean/weight shifting;Forward lean/weight shifting;Reaching for objects;Reaching across midline Dynamic Standing - Comments: Challenged patient with directional changes to encourgae patient to shift weight anteriorly and work on ankle strategy Exercise  General Exercises - Lower Extremity Long Arc Quad: AROM;Both;10 reps;Seated Hip Flexion/Marching: AROM;Both;10 reps;Seated End of Session PT - End of Session Equipment Utilized During Treatment: Gait belt Activity Tolerance: Patient tolerated treatment well Patient left: in chair;with call bell in reach;with family/visitor present Nurse Communication: Mobility status for transfers;Mobility status for ambulation General Behavior During Session: [facility] for tasks performed Cognition: [facility] for tasks performed  Fredrich Birks 12/03/2011, 10:04 AM 12/03/2011 Fredrich Birks PTA [phone] pager [phone] office

## 2011-12-03 NOTE — Discharge Summary (Signed)
Physician Discharge Summary  Patient ID: DEMETRES PROCHNOW MRN: [id number] DOB/AGE: [birth year] 76 y.o.  Admit date: 11/29/2011 Discharge date: 12/03/2011  Admission Diagnoses: Generalized weakness Chronic venous insufficiency Venous stasis ulcer Atrial fibrillation Diastolic heart failure Coronary artery disease Neuropathy   Discharge Diagnoses:  Principal Problem:  *Generalized weakness Active Problems:  Atrial fibrillation  Chronic diastolic heart failure  Cardiac pacemaker in situ  Coronary artery disease  Stasis leg ulcer  Neuropathy   Discharged Condition: good  Hospital Course:   The patient was admitted after discharge to nursing home after a ten-day stay. He gone to the bathroom and on attempting to stand on the commode he became extremely weak and was unable to get up. He was taken to the emergency room and admitted for generalized weakness and hypotension . He was documented to be mildly orthostatic in the emergency room. The patient's Lasix dose was decreased and he was given IV fluids. His blood pressure rose and his orthostatics were negative. He worked with physical therapy and was ambulating in the hallway only minimal assistance by discharge. He was feeling much better. Both lower legs were wrapped with compression wraps which need to be changed on Monday, January 28. The patient chronic medical problems remained stable. He is discharged to nursing home at the recommendation of physical therapy for continued conditioning and gait training with ultimate goal to discharge home  Diet low-sodium diet Activity as per physical therapy CODE STATUS full code  None  Significant Diagnostic Studies: labs:   Treatments: IV hydration  Discharge Exam: Blood pressure 125/61, pulse 70, temperature 97.2 F (36.2 C), temperature source Oral, resp. rate 18, height 5\' 4"  (1.626 m), weight 85.186 kg (187 lb 12.8 oz), SpO2 98.00%. General appearance: alert and cooperative Resp:  clear to auscultation bilaterally Cardio: regular rate and rhythm, S1, S2 normal, no murmur, click, rub or gallop  Disposition: Skilled Nursing Facility   Medication List  As of 12/03/2011  7:26 AM   ASK your doctor about these medications         acetaminophen 500 MG tablet   Commonly known as: TYLENOL   Take 1,000 mg by mouth at bedtime.      allopurinol 300 MG tablet   Commonly known as: ZYLOPRIM   Take 300 mg by mouth daily.      ALPRAZolam 0.5 MG tablet   Commonly known as: XANAX   Take 0.5 mg by mouth at bedtime as needed. For sleep      aspirin 81 MG tablet   Take 81 mg by mouth every other day.      atenolol 50 MG tablet   Commonly known as: TENORMIN   Take 50 mg by mouth daily.      cholecalciferol 1000 UNITS tablet   Commonly known as: VITAMIN D   Take 1,000 Units by mouth daily.      colchicine 0.6 MG tablet   Take 0.6 mg by mouth 2 (two) times daily. colcrys - for gout      esomeprazole 40 MG capsule   Commonly known as: NEXIUM   Take 40 mg by mouth as needed. For acid reflux      furosemide 80 MG tablet   Commonly known as: LASIX   Take 80 mg by mouth 2 (two) times daily.      gabapentin 100 MG capsule   Commonly known as: NEURONTIN   Take 100 mg by mouth 3 (three) times daily.      lisinopril 5  MG tablet   Commonly known as: PRINIVIL,ZESTRIL   Take 5 mg by mouth daily.      meclizine 12.5 MG tablet   Commonly known as: ANTIVERT   Take 6.25 mg by mouth 3 (three) times daily as needed. For dizziness        metolazone 2.5 MG tablet   Commonly known as: ZAROXOLYN   Take 2.5 mg by mouth daily as needed. 30 minutes before furosemide when needed for severe swelling      nitroGLYCERIN 0.4 MG SL tablet   Commonly known as: NITROSTAT   Place 0.4 mg under the tongue every 5 (five) minutes as needed. For chest pain      oxyCODONE-acetaminophen 5-325 MG per tablet   Commonly known as: PERCOCET   Take 2 tablets by mouth every 6 (six) hours as needed.        potassium chloride SA 20 MEQ tablet   Commonly known as: K-DUR,KLOR-CON   Take 20 mEq by mouth daily.      predniSONE 20 MG tablet   Commonly known as: DELTASONE   Take 2 tablets (40 mg total) by mouth 2 (two) times daily with a meal.      traMADol 50 MG tablet   Commonly known as: ULTRAM   Take 50 mg by mouth every 6 (six) hours as needed. For pain      warfarin 2.5 MG tablet   Commonly known as: COUMADIN   Take 2.5 mg by mouth daily.           Follow-up Information    Follow up with Lillia Mountain, MD .         Signed: Lillia Mountain 12/03/2011, 7:26 AM

## 2012-04-04 ENCOUNTER — Encounter: Payer: Self-pay | Admitting: *Deleted

## 2012-04-13 ENCOUNTER — Ambulatory Visit: Payer: Medicare Other | Admitting: *Deleted

## 2012-04-13 ENCOUNTER — Encounter (INDEPENDENT_AMBULATORY_CARE_PROVIDER_SITE_OTHER): Payer: Medicare Other | Admitting: *Deleted

## 2012-04-13 ENCOUNTER — Encounter: Payer: Self-pay | Admitting: Internal Medicine

## 2012-04-13 ENCOUNTER — Ambulatory Visit (INDEPENDENT_AMBULATORY_CARE_PROVIDER_SITE_OTHER): Payer: Medicare Other | Admitting: *Deleted

## 2012-04-13 ENCOUNTER — Encounter (HOSPITAL_BASED_OUTPATIENT_CLINIC_OR_DEPARTMENT_OTHER): Payer: Medicare Other | Attending: General Surgery

## 2012-04-13 DIAGNOSIS — I495 Sick sinus syndrome: Secondary | ICD-10-CM

## 2012-04-13 DIAGNOSIS — L97809 Non-pressure chronic ulcer of other part of unspecified lower leg with unspecified severity: Secondary | ICD-10-CM | POA: Insufficient documentation

## 2012-04-13 DIAGNOSIS — B353 Tinea pedis: Secondary | ICD-10-CM | POA: Insufficient documentation

## 2012-04-13 DIAGNOSIS — R0989 Other specified symptoms and signs involving the circulatory and respiratory systems: Secondary | ICD-10-CM

## 2012-04-13 LAB — PACEMAKER DEVICE OBSERVATION
AL IMPEDENCE PM: 400 Ohm
ATRIAL PACING PM: 1
BATTERY VOLTAGE: 2.9478 V
VENTRICULAR PACING PM: 52

## 2012-04-13 NOTE — Progress Notes (Signed)
PPM check 

## 2012-04-18 NOTE — Progress Notes (Signed)
[facility]  NAME:  [name], [name]                    ACCOUNT NO.:  MEDICAL RECORD NO.:  [id number]      DATE OF BIRTH:  [birth year]  PHYSICIAN:  Wayland Denis, DO       VISIT DATE:  04/18/2012                                  OFFICE VISIT   The patient is an 78 year old gentleman, who is here for followup on his bilateral lower extremity ulcers.  He had Profore Lite placed last week with quite a bit of improvement.  There is decreased swelling, decreased edema, the wounds are improving with a little bit of drainage.  No malodor.  He is still on his antibiotics.  There has been no change in his medications or social history.  EXAM:  GENERAL: He is alert, oriented, cooperative, not in any acute distress.  He is pleasant. EYES: Pupils equal.  Extraocular muscles are intact. NECK: No cervical lymphadenopathy. RESPIRATORY: His breathing is unlabored. HEART: Regular. ABDOMEN: Soft. EXTREMITIES: The lower extremities are described above.  He does have what looks like an athlete's foot on his left lower extremity.  Recommend either ketoconazole or Lotrimin over-the-counter, athlete's foot medication and continue with the Profore Lite.  He is in agreement for this.  I will see him back in 1 week.     Wayland Denis, DO     CS/MEDQ  D:  04/18/2012  T:  04/18/2012  Job:  [id number]

## 2012-04-25 NOTE — Progress Notes (Signed)
[facility]  NAME:  [name], [name]               ACCOUNT NO.:  [id number]  MEDICAL RECORD NO.:  [id number]      DATE OF BIRTH:  [birth year]  PHYSICIAN:  Wayland Denis, DO       VISIT DATE:  04/25/2012                                  OFFICE VISIT   The patient is an 78 year old gentleman who is here for bilateral chronic venous insufficiency of his lower extremities.  He has been getting Profore Lite with great improvement.  His athlete foot is healing up with the Lotrimin as well.  There has been no change in his medications or social history.  PHYSICAL EXAMINATION:  GENERAL:  He is alert, oriented, and cooperative, not in any acute distress.  He is pleasant. HEENT:  Pupils are equal.  Extraocular muscles are intact. NECK:  No cervical lymphadenopathy. LUNGS:  His breathing is unlabored. CARDIAC:  His heart is regular. ABDOMEN:  His abdomen soft. EXTREMITIES:  The lower extremities have a few small areas of superficial breakdown, but overall are markedly improved.  We will continue with the Profore Lite and SilverCel on two areas of the left leg that are still bit oozing and then we will see him back in 1 week.     Wayland Denis, DO     CS/MEDQ  D:  04/25/2012  T:  04/25/2012  Job:  [id number]

## 2012-05-03 NOTE — Progress Notes (Signed)
[facility]  NAME:  PRYOR, GUETTLER                    ACCOUNT NO.:  MEDICAL RECORD NO.:  [id number]      DATE OF BIRTH:  1923/02/06  PHYSICIAN:  Wayland Denis, DO       VISIT DATE:  05/02/2012                                  OFFICE VISIT   The patient is an 78 year old gentleman, who is here for followup on his bilateral lower extremity ulcers.  He had actually healed the left one, but it looks like the wrap may have fallen down a little bit and caused some irritation with some superficial opening on the left.  The right does look better.  It is still slow to progress, but it is improving. His medications are unchanged.  Social history are unchanged.  He is here with his son and has a lot of support.  On exam, pupils are equal.  Extraocular muscles are intact.  No cervical lymphadenopathy.  Breathing is unlabored.  Heart is regular. Abdomen is soft.  No organomegaly.  The wounds are described above.  We will continue with Profore Lite and Silvercel and zinc.  I will have him follow up in 1 week.  In the meantime, he is to continue with elevation, multivitamin, and increase his protein.     Wayland Denis, DO     CS/MEDQ  D:  05/02/2012  T:  05/02/2012  Job:  [id number]

## 2012-05-09 ENCOUNTER — Encounter (HOSPITAL_BASED_OUTPATIENT_CLINIC_OR_DEPARTMENT_OTHER): Payer: Medicare Other | Attending: General Surgery

## 2012-05-09 DIAGNOSIS — I872 Venous insufficiency (chronic) (peripheral): Secondary | ICD-10-CM | POA: Insufficient documentation

## 2012-05-09 DIAGNOSIS — L97309 Non-pressure chronic ulcer of unspecified ankle with unspecified severity: Secondary | ICD-10-CM | POA: Insufficient documentation

## 2012-05-09 NOTE — Progress Notes (Signed)
[facility]  NAME:  [name], [name]               ACCOUNT NO.:  [id number]  MEDICAL RECORD NO.:  [id number]      DATE OF BIRTH:  1923-04-01  PHYSICIAN:  Wayland Denis, DO       VISIT DATE:  05/09/2012                                  OFFICE VISIT   The patient is an 78 year old gentleman who is here for followup on his bilateral lower extremity ulcers.  He has been using SilverCel on both areas.  He has a bit of superficial breakdown on the left side, not a whole lot of change on the right.  His toes do appear to look better from a fungal standpoint.  There is no change in his medications or social history.  On exam, he is alert, oriented, and cooperative, not in any acute distress.  He is pleasant.  Pupils are equal.  Extraocular muscles are intact.  No cervical lymphadenopathy.  His breathing is unlabored.  His heart is regular.  His abdomen is soft.  There is definitely improvement in his swelling of the lower extremities.  We will add Unna boots on both sides and have him follow up in 1 week.     Wayland Denis, DO     CS/MEDQ  D:  05/09/2012  T:  05/09/2012  Job:  [id number]

## 2012-05-11 ENCOUNTER — Encounter (HOSPITAL_BASED_OUTPATIENT_CLINIC_OR_DEPARTMENT_OTHER): Payer: Medicare Other

## 2012-05-13 ENCOUNTER — Encounter: Payer: Self-pay | Admitting: Internal Medicine

## 2012-05-16 NOTE — Progress Notes (Signed)
[facility]  NAME:  ANZEL, KEARSE               ACCOUNT NO.:  [id number]  MEDICAL RECORD NO.:  [id number]      DATE OF BIRTH:  10-04-1923  PHYSICIAN:  Wayland Denis, DO       VISIT DATE:  05/16/2012                                  OFFICE VISIT   The patient is an 78 year old male who is here for followup on his bilateral lower extremity ulcers.  He had Unna boots placed last week with Silvercel and it did seem to help.  He had some pain because he kept his feet dependent and he had some swelling but overall it looks better and does feel better.  There has been no change in his medications or social history.  PHYSICAL EXAMINATION:  He is alert, oriented, cooperative, not in any acute distress.  He is pleasant.  Pupils equal.  Extraocular muscles are intact.  No cervical lymphadenopathy.  His breathing is unlabored.  His heart is regular.  We will continue with the Silvercel and Unna boots, and have him follow up in a week.     Wayland Denis, DO     CS/MEDQ  D:  05/16/2012  T:  05/16/2012  Job:  [id number]

## 2012-05-24 NOTE — Progress Notes (Signed)
[facility]  NAME:  [name], [name]               ACCOUNT NO.:  [id number]  MEDICAL RECORD NO.:  [id number]      DATE OF BIRTH:  [birth year]  PHYSICIAN:  Wayland Denis, DO       VISIT DATE:  05/23/2012                                  OFFICE VISIT   The patient is an 78 year old gentleman who is here for followup on his bilateral lower extremity chronic venous insufficiency ulcers.  There is a little bit of improvement from last week both in the wounds and in the overall swelling.  No change in his medications or social history.  REVIEW OF SYSTEMS:  Otherwise negative.  PHYSICAL EXAMINATION:  He is alert, oriented, cooperative, not in any acute distress.  He is pleasant.  His breathing is unlabored.  His wounds are described above.  He was using Silvadene and Unna boot this past week on each leg, which did show some improvement and we will continue with that.  We will also apply Oasis and recommend continuing with his vitamins, elevation, and increasing his protein.     Alan Ripper Sanger, DO     CS/MEDQ  D:  05/23/2012  T:  05/24/2012  Job:  [id number]

## 2012-06-07 NOTE — Progress Notes (Signed)
[facility]  NAME:  [name], [name]                    ACCOUNT NO.:  MEDICAL RECORD NO.:  [id number]      DATE OF BIRTH:  [birth year]  PHYSICIAN:  Wayland Denis, DO       VISIT DATE:  06/06/2012                                  OFFICE VISIT   The patient is an 78 year old gentleman who has bilateral chronic venous insufficiency on his legs.  He has been using Silvercel with an Radio broadcast assistant and has done extremely well.  The left side has completely healed. He still has a little bit of ulcer on the right medial aspect of his ankle on the right leg.  No change in medications or social history.  PHYSICAL EXAMINATION:  GENERAL:  He is alert, oriented, cooperative, not in any acute distress.  He is pleasant. HEENT:  Pupils equal.  Extraocular muscles intact. NECK:  No cervical lymphadenopathy. LUNGS:  Breathing is unlabored. HEART:  Regular. ABDOMEN:  Soft.  We will go with the compression hose on the left and continue with the Silvercel and Unna boot on the right, and have him follow up in 1 week. Continue with elevation and multivitamin.     Wayland Denis, DO     CS/MEDQ  D:  06/06/2012  T:  06/07/2012  Job:  [id number]

## 2012-06-13 ENCOUNTER — Encounter (HOSPITAL_BASED_OUTPATIENT_CLINIC_OR_DEPARTMENT_OTHER): Payer: Medicare Other | Attending: Plastic Surgery

## 2012-06-13 DIAGNOSIS — L97809 Non-pressure chronic ulcer of other part of unspecified lower leg with unspecified severity: Secondary | ICD-10-CM | POA: Insufficient documentation

## 2012-06-13 DIAGNOSIS — I872 Venous insufficiency (chronic) (peripheral): Secondary | ICD-10-CM | POA: Insufficient documentation

## 2012-06-14 NOTE — Progress Notes (Signed)
[facility]  NAME:  JAYLN, MADEIRA               ACCOUNT NO.:  [id number]  MEDICAL RECORD NO.:  [id number]      DATE OF BIRTH:  [birth year]  PHYSICIAN:  Wayland Denis, DO       VISIT DATE:  06/13/2012                                  OFFICE VISIT   The patient is an 78 year old gentleman who is here for followup on his right lower extremity ulcer.  He has been doing extremely well and much better with elevation.  He had Silvercel, zinc, and Unna boot last week. He is nearly all healed.  He does have a small area on the medial aspect of the ankle.  We will continue with the current treatment.  There is no change in his medications or social history.  PHYSICAL EXAMINATION:  He is alert, oriented, cooperative, not in any acute distress.  He is much improved.  His pupils are equal. Extraocular muscles intact.  No cervical lymphadenopathy.  His breathing is unlabored.  His heart is regular.  Lower extremity have good capillary refill.  Lots of varicose veins as before with hemosiderosis but markedly improved both in swelling and in the wound.  We will continue with Silvercel, zinc, and Unna boot.  We will see him back in 1 week.     Wayland Denis, DO     CS/MEDQ  D:  06/13/2012  T:  06/14/2012  Job:  [id number]

## 2012-06-22 NOTE — Progress Notes (Signed)
[facility]  NAME:  [name], [name]                    ACCOUNT NO.:  MEDICAL RECORD NO.:  [id number]      DATE OF BIRTH:  02-24-1923  PHYSICIAN:  Wayland Denis, DO       VISIT DATE:  06/20/2012                                  OFFICE VISIT   The patient is a 78 year old gentleman who is here for followup on his bilateral lower extremity chronic venous insufficiency ulcers.  He has been using a stocking on his left leg and the Unna boot on his right. He has done extremely well and the areas are completely healed.  He has a new wound that is very small on the left leg on the superior aspect and this is not a surprise and this is likely to occur on and off for the duration.  He is taking a multivitamin.  There has been no other change in his medications or social history.  On exam, he is alert, oriented, cooperative, not in any acute distress. He is very pleasant.  Pupils are equal.  External muscles are intact. No cervical lymphadenopathy.  His breathing is unlabored.  His heart is regular. His abdomen is soft.  The wounds are described above, a lot of hemosiderin and staining, but only the wound mentioned above. We will continue with the zinc and Silvercel on the open area and compression stockings.  We will see him back in followup.     Wayland Denis, DO     CS/MEDQ  D:  06/20/2012  T:  06/21/2012  Job:  [id number]

## 2012-06-28 NOTE — Progress Notes (Signed)
[facility]  NAME:  [name], [name]                    ACCOUNT NO.:  MEDICAL RECORD NO.:  [id number]      DATE OF BIRTH:  Mar 07, 1923  PHYSICIAN:  Wayland Denis, DO       VISIT DATE:  06/27/2012                                  OFFICE VISIT   The patient is an 78 year old gentleman who is here for followup on his lower extremity ulcers.  He has done extremely well and shows no sign of ulceration or drainage.  He has been using his compression stockings, which has made a huge difference. Medications are unchanged. Social history is unchanged.  On exam, he is alert, oriented, cooperative, not in any acute distress. He is pleasant.  Pupils are equal.  External muscles are intact.  No cervical lymphadenopathy.  Breathing is unlabored.  Heart is regular.  We will continue with the compression stockings and follow up as needed.     Wayland Denis, DO     CS/MEDQ  D:  06/27/2012  T:  06/28/2012  Job:  [id number]

## 2012-08-10 ENCOUNTER — Encounter: Payer: Self-pay | Admitting: *Deleted

## 2012-08-16 ENCOUNTER — Encounter: Payer: Self-pay | Admitting: Internal Medicine

## 2012-08-16 ENCOUNTER — Ambulatory Visit (INDEPENDENT_AMBULATORY_CARE_PROVIDER_SITE_OTHER): Payer: Medicare Other | Admitting: Internal Medicine

## 2012-08-16 VITALS — BP 136/70 | HR 68 | Ht 64.0 in | Wt 207.0 lb

## 2012-08-16 DIAGNOSIS — I5032 Chronic diastolic (congestive) heart failure: Secondary | ICD-10-CM

## 2012-08-16 DIAGNOSIS — Z950 Presence of cardiac pacemaker: Secondary | ICD-10-CM

## 2012-08-16 DIAGNOSIS — I4891 Unspecified atrial fibrillation: Secondary | ICD-10-CM

## 2012-08-16 LAB — PACEMAKER DEVICE OBSERVATION
ATRIAL PACING PM: 1
BRDY-0004RV: 120 {beats}/min
DEVICE MODEL PM: 7178130
RV LEAD AMPLITUDE: 12 mv
RV LEAD THRESHOLD: 0.625 V

## 2012-08-16 NOTE — Patient Instructions (Addendum)
Your physician wants you to follow-up in: 12 months with Dr Court Joy will receive a reminder letter in the mail two months in advance. If you don't receive a letter, please call our office to schedule the follow-up appointment.   Remote monitoring is used to monitor your Pacemaker of ICD from home. This monitoring reduces the number of office visits required to check your device to one time per year. It allows [place] to keep an eye on the functioning of your device to ensure it is working properly. You are scheduled for a device check from home on 11/22/11. You may send your transmission at any time that day. If you have a wireless device, the transmission will be sent automatically. After your physician reviews your transmission, you will receive a postcard with your next transmission date.

## 2012-08-17 ENCOUNTER — Encounter: Payer: Self-pay | Admitting: Internal Medicine

## 2012-08-17 NOTE — Progress Notes (Signed)
HPI [name] returns today for followup. He is a very pleasant totally man with a history of symptomatic tachybradycardia syndrome , status post permanent pacemaker insertion. He now has chronic atrial fibrillation. Despite this, he is stable. He denies palpitations, chest pain, or syncope. He notes that he had actually gotten too much Lasix and become hypokalemic. His medications have been adjusted. His heart failure symptoms are class II. Allergies  Allergen Reactions  . Azithromycin Other (See Comments)    unknown  . Codeine Other (See Comments)    unknown  . Digoxin Other (See Comments)    unknown  . Indomethacin Other (See Comments)    unknown  . Lorazepam Other (See Comments)    unknown  . Amlodipine Besylate Rash  . Sulfonamide Derivatives Rash     Current Outpatient Prescriptions  Medication Sig Dispense Refill  . acetaminophen (TYLENOL) 500 MG tablet Take 1,000 mg by mouth at bedtime.       Marland Kitchen allopurinol (ZYLOPRIM) 300 MG tablet Take 300 mg by mouth daily.       Marland Kitchen ALPRAZolam (XANAX) 0.5 MG tablet Take 0.5 mg by mouth at bedtime as needed. For sleep      . aspirin 81 MG tablet Take 81 mg by mouth every other day.       Marland Kitchen atenolol (TENORMIN) 50 MG tablet Take 50 mg by mouth daily.       . cholecalciferol (VITAMIN D) 1000 UNITS tablet Take 1,000 Units by mouth daily.       . colchicine 0.6 MG tablet Take 0.6 mg by mouth as needed.      Marland Kitchen esomeprazole (NEXIUM) 40 MG capsule Take 40 mg by mouth as needed. For acid reflux      . furosemide (LASIX) 40 MG tablet Take 80 mg by mouth daily.      Marland Kitchen gabapentin (NEURONTIN) 100 MG capsule Take 100 mg by mouth 3 (three) times daily.       . meclizine (ANTIVERT) 12.5 MG tablet Take 6.25 mg by mouth 3 (three) times daily as needed. For dizziness       . nitroGLYCERIN (NITROSTAT) 0.4 MG SL tablet Place 0.4 mg under the tongue every 5 (five) minutes as needed. For chest pain      . potassium chloride SA (K-DUR,KLOR-CON) 20 MEQ tablet Take  20 mEq by mouth 2 (two) times daily.       Marland Kitchen warfarin (COUMADIN) 2.5 MG tablet Take 2.5 mg by mouth daily.         Past Medical History  Diagnosis Date  . Hypertension   . CHF (congestive heart failure)   . Arrhythmia     afib  . Neuropathy   . Cancer     ROS:   All systems reviewed and negative except as noted in the HPI.   Past Surgical History  Procedure Date  . Pacemaker insertion   . Cardiac surgery   . Eye surgery   . Back and knee surgeries unknown     No family history on file.   History   Social History  . Marital Status: Widowed    Spouse Name: N/A    Number of Children: N/A  . Years of Education: N/A   Occupational History  . Not on file.   Social History Main Topics  . Smoking status: Former Smoker -- 1.0 packs/day for 15 years    Types: Cigarettes    Quit date: 11/13/1944  . Smokeless tobacco: Not on file  .  Alcohol Use: No  . Drug Use: No  . Sexually Active:    Other Topics Concern  . Not on file   Social History Narrative  . No narrative on file     BP 136/70  Pulse 68  Ht 5\' 4"  (1.626 m)  Wt 207 lb (93.895 kg)  BMI 35.53 kg/m2  Physical Exam:  Well appearing elderly man, NAD HEENT: Unremarkable Neck:  No JVD, no thyromegally Lungs:  Clear with no wheezes, rales, or rhonchi. HEART:  Regular rate rhythm, no murmurs, no rubs, no clicks Abd:  soft, positive bowel sounds, no organomegally, no rebound, no guarding Ext:  2 plus pulses, no edema, no cyanosis, no clubbing Skin:  No rashes no nodules Neuro:  CN II through XII intact, motor grossly intact   DEVICE  Normal device function.  See PaceArt for details.   Assess/Plan:

## 2012-08-17 NOTE — Assessment & Plan Note (Signed)
His ventricular rates appear to be well-controlled. No change in medical therapy.

## 2012-08-17 NOTE — Assessment & Plan Note (Signed)
His St. Jude dual-chamber pacemaker is working normally. We'll plan to recheck in several months. 

## 2012-08-17 NOTE — Assessment & Plan Note (Signed)
His congestive heart failure symptoms are well compensated. His volume status is stable. He is instructed to maintain a low-sodium diet and continue his current medical therapy.

## 2012-09-06 ENCOUNTER — Encounter (HOSPITAL_BASED_OUTPATIENT_CLINIC_OR_DEPARTMENT_OTHER): Payer: Medicare Other | Attending: General Surgery

## 2012-09-06 DIAGNOSIS — E785 Hyperlipidemia, unspecified: Secondary | ICD-10-CM | POA: Insufficient documentation

## 2012-09-06 DIAGNOSIS — I10 Essential (primary) hypertension: Secondary | ICD-10-CM | POA: Insufficient documentation

## 2012-09-06 DIAGNOSIS — I252 Old myocardial infarction: Secondary | ICD-10-CM | POA: Insufficient documentation

## 2012-09-06 DIAGNOSIS — K219 Gastro-esophageal reflux disease without esophagitis: Secondary | ICD-10-CM | POA: Insufficient documentation

## 2012-09-06 DIAGNOSIS — L97809 Non-pressure chronic ulcer of other part of unspecified lower leg with unspecified severity: Secondary | ICD-10-CM | POA: Insufficient documentation

## 2012-09-06 DIAGNOSIS — I509 Heart failure, unspecified: Secondary | ICD-10-CM | POA: Insufficient documentation

## 2012-09-06 DIAGNOSIS — I872 Venous insufficiency (chronic) (peripheral): Secondary | ICD-10-CM | POA: Insufficient documentation

## 2012-09-06 DIAGNOSIS — Z86711 Personal history of pulmonary embolism: Secondary | ICD-10-CM | POA: Insufficient documentation

## 2012-09-06 DIAGNOSIS — Z951 Presence of aortocoronary bypass graft: Secondary | ICD-10-CM | POA: Insufficient documentation

## 2012-09-06 NOTE — H&P (Signed)
NAMEPARTH, Thornton               ACCOUNT NO.:  [id number]  MEDICAL RECORD NO.:  [id number]  LOCATION:  FOOT                         FACILITY:  [facility]  PHYSICIAN:  Joanne Gavel, M.D.        DATE OF BIRTH:  10/10/1923  DATE OF ADMISSION:  09/06/2012 DATE OF DISCHARGE:                             HISTORY & PHYSICAL   CHIEF COMPLAINT:  Wounds, left leg.  HISTORY OF PRESENT ILLNESS:  This 78 year old male with a long history of chronic venous stasis and multiple episodes of ulcerations, returns with an ulcer present approximately 3 or 4 days.  He was last seen in our clinic in August for the same problem.  This responded well to external compression.  PAST MEDICAL HISTORY:  Significant for lymphoma in 2006, massive heart attack in 1986 associated with episodes of CPR, CABG in 1990, pacemaker in 2012.  He also had a pulmonary embolism in 2007.  He has hypertension, hyperlipidemia, episodes of congestive heart failure, GERD and peptic ulcer disease, gout and cellulitis of the left lower extremity.  PAST SURGICAL HISTORY:  Cataracts, CABG and pacemaker.  Cigarettes none.  Alcohol none.  MEDICATIONS:  Allopurinol, atenolol, colchicine, furosemide, gabapentin, Klor-Con, Coumadin, alprazolam.  ALLERGIES:  DOXYCYCLINE, AUGMENTIN, BACTRIM, AZITHROMYCIN, AVELOX and LEVAQUIN.  All of these medications cause a rash.  REVIEW OF SYSTEMS:  As above.  PHYSICAL EXAMINATION:  VITAL SIGNS:  Temperature 98, pulse 84, respiratory rate is 18, blood pressure 129/81. GENERAL APPEARANCE:  A well developed, well nourished, no distress. Normal symmetrical strength. CHEST:  Clear. HEART:  Slightly irregular. ABDOMEN:  Not examined. EXTREMITIES:  Examination of extremities reveals good peripheral pulses. Both legs show great deal of stasis dermatitis with discoloration, also swelling is 1+ to 0.  There are several small wounds anteriorly 1.7 x 0.7 x 0.1 on the left leg and medially there is a 0.9 x 0.4  x 0.1.  The base of these wounds is clean.  IMPRESSION:  Chronic venous stasis with ulceration.  PLAN OF TREATMENT:  Silver alginate and Unna boot.  We will see him in 7 days.     Joanne Gavel, M.D.     RA/MEDQ  D:  09/06/2012  T:  09/06/2012  Job:  [id number]

## 2012-09-12 ENCOUNTER — Encounter (HOSPITAL_BASED_OUTPATIENT_CLINIC_OR_DEPARTMENT_OTHER): Payer: Medicare Other | Attending: General Surgery

## 2012-09-12 ENCOUNTER — Other Ambulatory Visit: Payer: Self-pay | Admitting: Dermatology

## 2012-09-12 DIAGNOSIS — L97909 Non-pressure chronic ulcer of unspecified part of unspecified lower leg with unspecified severity: Secondary | ICD-10-CM | POA: Insufficient documentation

## 2012-09-20 ENCOUNTER — Other Ambulatory Visit: Payer: Self-pay | Admitting: Cardiology

## 2012-09-20 DIAGNOSIS — I739 Peripheral vascular disease, unspecified: Secondary | ICD-10-CM

## 2012-09-22 NOTE — Progress Notes (Signed)
This encounter was created in error - please disregard.

## 2012-10-04 ENCOUNTER — Encounter (INDEPENDENT_AMBULATORY_CARE_PROVIDER_SITE_OTHER): Payer: Medicare Other

## 2012-10-04 ENCOUNTER — Encounter (HOSPITAL_BASED_OUTPATIENT_CLINIC_OR_DEPARTMENT_OTHER): Payer: Medicare Other

## 2012-10-04 DIAGNOSIS — L98499 Non-pressure chronic ulcer of skin of other sites with unspecified severity: Secondary | ICD-10-CM

## 2012-10-04 DIAGNOSIS — I739 Peripheral vascular disease, unspecified: Secondary | ICD-10-CM

## 2012-10-11 ENCOUNTER — Encounter (HOSPITAL_BASED_OUTPATIENT_CLINIC_OR_DEPARTMENT_OTHER): Payer: Medicare Other | Attending: General Surgery

## 2012-10-11 DIAGNOSIS — I87319 Chronic venous hypertension (idiopathic) with ulcer of unspecified lower extremity: Secondary | ICD-10-CM | POA: Insufficient documentation

## 2012-10-11 DIAGNOSIS — L97909 Non-pressure chronic ulcer of unspecified part of unspecified lower leg with unspecified severity: Secondary | ICD-10-CM | POA: Insufficient documentation

## 2012-11-15 ENCOUNTER — Encounter (HOSPITAL_BASED_OUTPATIENT_CLINIC_OR_DEPARTMENT_OTHER): Payer: Medicare Other | Attending: General Surgery

## 2012-11-15 DIAGNOSIS — I87319 Chronic venous hypertension (idiopathic) with ulcer of unspecified lower extremity: Secondary | ICD-10-CM | POA: Insufficient documentation

## 2012-11-15 DIAGNOSIS — L97909 Non-pressure chronic ulcer of unspecified part of unspecified lower leg with unspecified severity: Secondary | ICD-10-CM | POA: Insufficient documentation

## 2012-11-21 ENCOUNTER — Ambulatory Visit (INDEPENDENT_AMBULATORY_CARE_PROVIDER_SITE_OTHER): Payer: Medicare Other | Admitting: *Deleted

## 2012-11-21 ENCOUNTER — Encounter: Payer: Self-pay | Admitting: Internal Medicine

## 2012-11-21 DIAGNOSIS — Z950 Presence of cardiac pacemaker: Secondary | ICD-10-CM

## 2012-11-21 DIAGNOSIS — I4891 Unspecified atrial fibrillation: Secondary | ICD-10-CM

## 2012-11-21 LAB — REMOTE PACEMAKER DEVICE
AL IMPEDENCE PM: 360 Ohm
ATRIAL PACING PM: 1
BAMS-0001: 150 {beats}/min
RV LEAD IMPEDENCE PM: 730 Ohm
VENTRICULAR PACING PM: 47

## 2012-11-23 ENCOUNTER — Encounter: Payer: Self-pay | Admitting: *Deleted

## 2012-12-13 ENCOUNTER — Encounter (HOSPITAL_BASED_OUTPATIENT_CLINIC_OR_DEPARTMENT_OTHER): Payer: Medicare Other | Attending: General Surgery

## 2012-12-13 DIAGNOSIS — I87319 Chronic venous hypertension (idiopathic) with ulcer of unspecified lower extremity: Secondary | ICD-10-CM | POA: Insufficient documentation

## 2012-12-13 DIAGNOSIS — L97909 Non-pressure chronic ulcer of unspecified part of unspecified lower leg with unspecified severity: Secondary | ICD-10-CM | POA: Insufficient documentation

## 2012-12-27 ENCOUNTER — Other Ambulatory Visit: Payer: Self-pay | Admitting: Dermatology

## 2013-01-10 ENCOUNTER — Encounter (HOSPITAL_BASED_OUTPATIENT_CLINIC_OR_DEPARTMENT_OTHER): Payer: Medicare Other | Attending: General Surgery

## 2013-01-10 DIAGNOSIS — L97909 Non-pressure chronic ulcer of unspecified part of unspecified lower leg with unspecified severity: Secondary | ICD-10-CM | POA: Insufficient documentation

## 2013-02-07 ENCOUNTER — Encounter (HOSPITAL_BASED_OUTPATIENT_CLINIC_OR_DEPARTMENT_OTHER): Payer: Medicare Other | Attending: General Surgery

## 2013-02-07 DIAGNOSIS — I87319 Chronic venous hypertension (idiopathic) with ulcer of unspecified lower extremity: Secondary | ICD-10-CM | POA: Insufficient documentation

## 2013-02-07 DIAGNOSIS — L97909 Non-pressure chronic ulcer of unspecified part of unspecified lower leg with unspecified severity: Secondary | ICD-10-CM | POA: Insufficient documentation

## 2013-02-20 ENCOUNTER — Ambulatory Visit (INDEPENDENT_AMBULATORY_CARE_PROVIDER_SITE_OTHER): Payer: Medicare Other | Admitting: *Deleted

## 2013-02-20 ENCOUNTER — Other Ambulatory Visit: Payer: Self-pay | Admitting: Internal Medicine

## 2013-02-20 DIAGNOSIS — Z950 Presence of cardiac pacemaker: Secondary | ICD-10-CM

## 2013-02-20 DIAGNOSIS — I4891 Unspecified atrial fibrillation: Secondary | ICD-10-CM

## 2013-02-20 LAB — REMOTE PACEMAKER DEVICE
BRDY-0002RV: 60 {beats}/min
DEVICE MODEL PM: 7178130
RV LEAD AMPLITUDE: 12 mv
RV LEAD IMPEDENCE PM: 760 Ohm

## 2013-03-07 ENCOUNTER — Encounter: Payer: Self-pay | Admitting: *Deleted

## 2013-03-09 ENCOUNTER — Encounter: Payer: Self-pay | Admitting: Internal Medicine

## 2013-03-14 ENCOUNTER — Encounter (HOSPITAL_BASED_OUTPATIENT_CLINIC_OR_DEPARTMENT_OTHER): Payer: Medicare Other | Attending: General Surgery

## 2013-03-14 DIAGNOSIS — L97909 Non-pressure chronic ulcer of unspecified part of unspecified lower leg with unspecified severity: Secondary | ICD-10-CM | POA: Insufficient documentation

## 2013-03-14 DIAGNOSIS — I87319 Chronic venous hypertension (idiopathic) with ulcer of unspecified lower extremity: Secondary | ICD-10-CM | POA: Insufficient documentation

## 2013-04-11 ENCOUNTER — Encounter (HOSPITAL_BASED_OUTPATIENT_CLINIC_OR_DEPARTMENT_OTHER): Payer: Medicare Other | Attending: General Surgery

## 2013-04-11 DIAGNOSIS — L97809 Non-pressure chronic ulcer of other part of unspecified lower leg with unspecified severity: Secondary | ICD-10-CM | POA: Insufficient documentation

## 2013-04-11 DIAGNOSIS — I872 Venous insufficiency (chronic) (peripheral): Secondary | ICD-10-CM | POA: Insufficient documentation

## 2013-04-25 ENCOUNTER — Encounter: Payer: Self-pay | Admitting: Internal Medicine

## 2013-04-25 ENCOUNTER — Ambulatory Visit (INDEPENDENT_AMBULATORY_CARE_PROVIDER_SITE_OTHER): Payer: Medicare Other | Admitting: Internal Medicine

## 2013-04-25 VITALS — BP 126/70 | HR 71 | Ht 64.0 in | Wt 200.0 lb

## 2013-04-25 DIAGNOSIS — I83029 Varicose veins of left lower extremity with ulcer of unspecified site: Secondary | ICD-10-CM

## 2013-04-25 DIAGNOSIS — IMO0001 Reserved for inherently not codable concepts without codable children: Secondary | ICD-10-CM

## 2013-04-25 DIAGNOSIS — I83009 Varicose veins of unspecified lower extremity with ulcer of unspecified site: Secondary | ICD-10-CM

## 2013-04-25 DIAGNOSIS — I4891 Unspecified atrial fibrillation: Secondary | ICD-10-CM

## 2013-04-25 DIAGNOSIS — Z951 Presence of aortocoronary bypass graft: Secondary | ICD-10-CM

## 2013-04-25 DIAGNOSIS — L97909 Non-pressure chronic ulcer of unspecified part of unspecified lower leg with unspecified severity: Secondary | ICD-10-CM

## 2013-04-25 NOTE — Progress Notes (Signed)
OFFICE NOTE  Chief Complaint:  Venous stasis ulcer  Primary Care Physician: Alexander Mountain, MD  HPI:  [name] is a pleasant E31-year-old male with a history of coronary artery disease, atrial fibrillation, Hodgkin's lymphoma status post chemotherapy x2, and multiple other medical problems. He was referred to me for evaluation of a venous ulcer on his left leg. This has developed over the past several months. He is noted to have long-standing venous stasis changes of the lower extremities, and has been treated at [name].  To spare her history is significant for varicose veins.  The os is a history of marked swelling and blisters of the lower extremities in the past. Currently he has a well dressed left lower extremity in the area of a venous stasis ulcer on the anterior shin. The right lower extremity is fitted with a compression stocking. He does report aching in his and heaviness in both legs with swelling and decreased sensation. He is also status post vein harvest from the right lower extremity for bypass surgery.  He is kindly referred for evaluation as a candidate for endovenous ablation.  PMHx:  Past Medical History  Diagnosis Date  . Hypertension   . CHF (congestive heart failure)   . Arrhythmia     afib  . Neuropathy   . Cancer     lymphoma  . Heart attack 1986    Past Surgical History  Procedure Laterality Date  . Pacemaker insertion  08/2010  . Cardiac surgery    . Eye surgery  10/2006, 11/2006  . Back and knee surgeries  unknown  . Tonsillectomy  1972  . Nasal sinus surgery  1980  . Coronary artery bypass graft  1990  . Elbow surgery      r/t gout    FAMHx:  Family History  Problem Relation Age of Onset  . Diabetes type II Sister   . Heart disease Brother   . Diabetes type II Daughter     SOCHx:   reports that he quit smoking about 68 years ago. His smoking use included Cigarettes. He has a 15 pack-year smoking  history. He has never used smokeless tobacco. He reports that he does not drink alcohol or use illicit drugs.  ALLERGIES:  Allergies  Allergen Reactions  . Aleve (Naproxen Sodium)     GI Upset  . Avelox (Moxifloxacin Hcl In Nacl) Other (See Comments)    Messed up lungs  . Codeine Other (See Comments)    unknown  . Digoxin Other (See Comments)    unknown  . Indomethacin Other (See Comments)    unknown  . Levaquin (Levofloxacin)     "affected lungs"  . Lipitor (Atorvastatin) Nausea Only  . Lorazepam Other (See Comments)    unknown  . Norpace (Disopyramide Phosphate) Other (See Comments)    Urinary retention  . Pravachol (Pravastatin Sodium) Nausea Only  . Uloric (Febuxostat)   . Amlodipine Besylate Rash  . Azithromycin Rash    unknown  . Bactrim (Sulfamethoxazole-Tmp Ds) Rash  . Clinoril (Sulindac) Rash  . Percocet (Oxycodone-Acetaminophen) Rash  . Sulfonamide Derivatives Rash    ROS: A comprehensive review of systems was negative except for: Integument/breast: positive for skin color change and Left venous stasis ulcer  HOME MEDS: Current Outpatient Prescriptions  Medication Sig Dispense Refill  . acetaminophen (TYLENOL) 500 MG tablet Take 1,000 mg by mouth at bedtime.       Marland Kitchen allopurinol (ZYLOPRIM) 300 MG tablet  Take 300 mg by mouth daily.       Marland Kitchen ALPRAZolam (XANAX) 0.5 MG tablet Take 0.5 mg by mouth at bedtime as needed. For sleep      . aspirin 81 MG tablet Take 81 mg by mouth every other day.       Marland Kitchen atenolol (TENORMIN) 50 MG tablet Take 50 mg by mouth daily.       . cholecalciferol (VITAMIN D) 1000 UNITS tablet Take 2,000 Units by mouth daily.       . colchicine 0.6 MG tablet Take 0.6 mg by mouth as needed.      Marland Kitchen esomeprazole (NEXIUM) 40 MG capsule Take 40 mg by mouth as needed. For acid reflux      . furosemide (LASIX) 80 MG tablet Take 80 mg by mouth daily.      Marland Kitchen gabapentin (NEURONTIN) 100 MG capsule Take 100 mg by mouth 3 (three) times daily.       .  Magnesium 250 MG TABS Take 1-2 tablets by mouth daily.      . meclizine (ANTIVERT) 12.5 MG tablet Take 6.25 mg by mouth 3 (three) times daily as needed. For dizziness       . metolazone (ZAROXOLYN) 2.5 MG tablet Take 2.5 mg by mouth. Take 30 mins before taking furosemide as needed      . mupirocin ointment (BACTROBAN) 2 % Apply 1 application topically daily.      . Omega-3 Fatty Acids (FISH OIL) 1000 MG CAPS Take 1 capsule by mouth daily.      . potassium chloride SA (K-DUR,KLOR-CON) 20 MEQ tablet Take 20 mEq by mouth 2 (two) times daily.       . traMADol (ULTRAM) 50 MG tablet Take 50 mg by mouth every 6 (six) hours as needed for pain.      Marland Kitchen warfarin (COUMADIN) 2.5 MG tablet Take 2.5 mg by mouth daily.      . nitroGLYCERIN (NITROSTAT) 0.4 MG SL tablet Place 0.4 mg under the tongue every 5 (five) minutes as needed. For chest pain       No current facility-administered medications for this visit.    LABS/IMAGING: No results found for this or any previous visit (from the past 48 hour(s)). No results found.  VITALS: BP 126/70  Pulse 71  Ht 5\' 4"  (1.626 m)  Wt 200 lb (90.719 kg)  BMI 34.31 kg/m2  EXAM: General appearance: alert and no distress Neck: no adenopathy, no carotid bruit, no JVD, supple, symmetrical, trachea midline and thyroid not enlarged, symmetric, no tenderness/mass/nodules Lungs: clear to auscultation bilaterally Heart: irregularly irregular rhythm Abdomen: soft, non-tender; bowel sounds normal; no masses,  no organomegaly and Obese Extremities: varicose veins noted, venous stasis dermatitis noted and Venous stasis ulcer of the left medial shin, the left and right lower extremities demonstrate venous stasis dermatitis with atrophic blanche Pulses: 1+ posterior tibial and dorsalis pedis Skin: Venous stasis changes including atrophic blanche Neurologic: Grossly normal  EKG: Atrial fibrillation with occasional paced beats at 77  ASSESSMENT: 1. CEAP 1, 2, 3, 4A, 4B, 6  venous disease  PLAN: 1.   [name] has advanced bilateral venous stasis disease, with a healing ulcer of the left lower extremity. There is bilateral atrophic blanche with prominent petechiae and hemosiderin discoloration of the lower extremities, putting him at high risk for recurrent venous ulceration.  There is an open vein harvest scar in the right lower extremity, however there may be persistently insufficient veins of the right leg  as well as the left. Most of his symptoms are related to his left leg, and I would like to obtain both venous and arterial Dopplers to evaluate his circulation in the legs. If he has preserved arterial circulation, and significant venous reflux, he may be a candidate for venous ablation. It will be imperative for the sonographer to get ultrasound images at the area of ulceration to look for perforator vessels. Hopefully we can revise some additional treatment for Mr. Lever.  Thank you for this kind referral. I will be in contact with the results of his Dopplers.  Chrystie Nose, MD, [facility] Attending Cardiologist [facility]  HILTY,Kenneth C 04/25/2013, 5:09 PM

## 2013-04-25 NOTE — Patient Instructions (Signed)
Your physician has requested that you have a lower extremity venous duplex. This test is an ultrasound of the veins in the legs. It looks at venous blood flow that carries blood from the heart to the legs. Allow one hour for a Lower Venous exam.  There are no restrictions or special instructions.   Your physician has requested that you have a lower extremity arterial duplex. This test is an ultrasound of the arteries in the legs. It looks at arterial blood flow in the legs. Allow one hour for Lower Arterial scans. There are no restrictions or special instructions  Please schedule these studies within a week.   Please follow-up with Dr. Rennis Golden in 1-2 weeks.

## 2013-04-27 ENCOUNTER — Encounter: Payer: Self-pay | Admitting: Internal Medicine

## 2013-04-27 ENCOUNTER — Ambulatory Visit (HOSPITAL_COMMUNITY)
Admission: RE | Admit: 2013-04-27 | Discharge: 2013-04-27 | Disposition: A | Discharge status: Home / Self Care | Payer: Medicare Other | Source: Ambulatory Visit | Attending: Cardiovascular Disease | Admitting: Cardiovascular Disease

## 2013-04-27 DIAGNOSIS — L97909 Non-pressure chronic ulcer of unspecified part of unspecified lower leg with unspecified severity: Secondary | ICD-10-CM

## 2013-04-27 DIAGNOSIS — IMO0001 Reserved for inherently not codable concepts without codable children: Secondary | ICD-10-CM

## 2013-04-27 DIAGNOSIS — L98499 Non-pressure chronic ulcer of skin of other sites with unspecified severity: Secondary | ICD-10-CM | POA: Insufficient documentation

## 2013-04-27 DIAGNOSIS — I872 Venous insufficiency (chronic) (peripheral): Secondary | ICD-10-CM | POA: Insufficient documentation

## 2013-04-27 NOTE — Progress Notes (Signed)
Lower Extremity Venous Duplex Completed. °[name] ° °

## 2013-05-02 ENCOUNTER — Ambulatory Visit (HOSPITAL_COMMUNITY)
Admission: RE | Admit: 2013-05-02 | Discharge: 2013-05-02 | Disposition: A | Discharge status: Home / Self Care | Payer: Medicare Other | Source: Ambulatory Visit | Attending: Cardiovascular Disease | Admitting: Cardiovascular Disease

## 2013-05-02 DIAGNOSIS — M7989 Other specified soft tissue disorders: Secondary | ICD-10-CM

## 2013-05-02 DIAGNOSIS — I739 Peripheral vascular disease, unspecified: Secondary | ICD-10-CM

## 2013-05-02 DIAGNOSIS — L98499 Non-pressure chronic ulcer of skin of other sites with unspecified severity: Secondary | ICD-10-CM | POA: Insufficient documentation

## 2013-05-02 DIAGNOSIS — IMO0001 Reserved for inherently not codable concepts without codable children: Secondary | ICD-10-CM

## 2013-05-02 DIAGNOSIS — I872 Venous insufficiency (chronic) (peripheral): Secondary | ICD-10-CM | POA: Insufficient documentation

## 2013-05-02 NOTE — Progress Notes (Signed)
Arterial Lower Ext. Duplex Completed. Ceasia Elwell, RDMS, RVT  

## 2013-05-09 ENCOUNTER — Encounter (HOSPITAL_BASED_OUTPATIENT_CLINIC_OR_DEPARTMENT_OTHER): Payer: Medicare Other | Attending: General Surgery

## 2013-05-09 DIAGNOSIS — L97909 Non-pressure chronic ulcer of unspecified part of unspecified lower leg with unspecified severity: Secondary | ICD-10-CM | POA: Insufficient documentation

## 2013-05-16 ENCOUNTER — Encounter: Payer: Self-pay | Admitting: Internal Medicine

## 2013-05-16 ENCOUNTER — Ambulatory Visit (INDEPENDENT_AMBULATORY_CARE_PROVIDER_SITE_OTHER): Payer: Medicare Other | Admitting: Internal Medicine

## 2013-05-16 ENCOUNTER — Ambulatory Visit: Payer: Medicare Other | Admitting: Internal Medicine

## 2013-05-16 VITALS — BP 148/80 | HR 72 | Ht 64.0 in | Wt 196.2 lb

## 2013-05-16 DIAGNOSIS — I83009 Varicose veins of unspecified lower extremity with ulcer of unspecified site: Secondary | ICD-10-CM

## 2013-05-16 DIAGNOSIS — Z951 Presence of aortocoronary bypass graft: Secondary | ICD-10-CM

## 2013-05-16 DIAGNOSIS — G629 Polyneuropathy, unspecified: Secondary | ICD-10-CM

## 2013-05-16 DIAGNOSIS — I4891 Unspecified atrial fibrillation: Secondary | ICD-10-CM

## 2013-05-16 DIAGNOSIS — G589 Mononeuropathy, unspecified: Secondary | ICD-10-CM

## 2013-05-16 DIAGNOSIS — L97909 Non-pressure chronic ulcer of unspecified part of unspecified lower leg with unspecified severity: Secondary | ICD-10-CM

## 2013-05-16 DIAGNOSIS — I83893 Varicose veins of bilateral lower extremities with other complications: Secondary | ICD-10-CM | POA: Insufficient documentation

## 2013-05-16 MED ORDER — DIAZEPAM 5 MG PO TABS: ORAL_TABLET | ORAL | Status: DC

## 2013-05-16 NOTE — Patient Instructions (Addendum)
Patient signed Medical Release Form on 05/16/13.  Consent for venous ablation signed 05/16/13. Dr. Rennis Golden has ordered a venous ablation. Please schedule for August vein clinic.  You have a prescription for Valium 5mg . Please take tablet 2 hours prior to procedure. You will need to have someone drive you to and from this procedure.

## 2013-05-16 NOTE — Progress Notes (Signed)
OFFICE NOTE  Chief Complaint:  Venous stasis ulcer  Primary Care Physician: Lillia Mountain, MD  HPI:  KYLON Thornton is a pleasant E47-year-old male with a history of coronary artery disease, atrial fibrillation, Hodgkin's lymphoma status post chemotherapy x2, and multiple other medical problems. He was referred to me for evaluation of a venous ulcer on his left leg. This has developed over the past several months. He is noted to have long-standing venous stasis changes of the lower extremities, and has been treated at the wound care center at [facility] long.  To spare her history is significant for varicose veins.  The os is a history of marked swelling and blisters of the lower extremities in the past. Currently he has a well dressed left lower extremity in the area of a venous stasis ulcer on the anterior shin. The right lower extremity is fitted with a compression stocking. He does report aching in his and heaviness in both legs with swelling and decreased sensation. He is also status post vein harvest from the right lower extremity for bypass surgery.  He is kindly referred for evaluation as a candidate for endovenous ablation.    Since his last visit, he underwent venous and arterial dopplers of both legs. The arterial dopplers demonstrate normal bilateral ABIs. The right posterior tibial artery appears occluded, this may be since birth.  Venous insufficiency studies demonstrate venous reflux bilaterally in the greater saphenous veins, which is likely contributing to recurrent venous ulcers. He said started developing a venous ulcer on the right shin.  PMHx:  Past Medical History  Diagnosis Date  . Hypertension   . CHF (congestive heart failure)   . Arrhythmia     afib  . Neuropathy   . Cancer     lymphoma  . Heart attack 1986    Past Surgical History  Procedure Laterality Date  . Pacemaker insertion  08/2010  . Cardiac surgery    . Eye surgery  10/2006, 11/2006  . Back and  knee surgeries  unknown  . Tonsillectomy  1972  . Nasal sinus surgery  1980  . Coronary artery bypass graft  1990  . Elbow surgery      r/t gout    FAMHx:  Family History  Problem Relation Age of Onset  . Diabetes type II Sister   . Heart disease Brother   . Diabetes type II Daughter     SOCHx:   reports that he quit smoking about 68 years ago. His smoking use included Cigarettes. He has a 15 pack-year smoking history. He has never used smokeless tobacco. He reports that he does not drink alcohol or use illicit drugs.  ALLERGIES:  Allergies  Allergen Reactions  . Aleve (Naproxen Sodium)     GI Upset  . Avelox (Moxifloxacin Hcl In Nacl) Other (See Comments)    Messed up lungs  . Codeine Other (See Comments)    unknown  . Digoxin Other (See Comments)    unknown  . Indomethacin Other (See Comments)    unknown  . Levaquin (Levofloxacin)     "affected lungs"  . Lipitor (Atorvastatin) Nausea Only  . Lorazepam Other (See Comments)    unknown  . Norpace (Disopyramide Phosphate) Other (See Comments)    Urinary retention  . Pravachol (Pravastatin Sodium) Nausea Only  . Uloric (Febuxostat)   . Amlodipine Besylate Rash  . Azithromycin Rash    unknown  . Bactrim (Sulfamethoxazole-Tmp Ds) Rash  . Clinoril (Sulindac) Rash  . Percocet (Oxycodone-Acetaminophen)  Rash  . Sulfonamide Derivatives Rash    ROS: A comprehensive review of systems was negative except for: Integument/breast: positive for skin color change and Left venous stasis ulcer  HOME MEDS: Current Outpatient Prescriptions  Medication Sig Dispense Refill  . acetaminophen (TYLENOL) 500 MG tablet Take 1,000 mg by mouth at bedtime.       Marland Kitchen allopurinol (ZYLOPRIM) 300 MG tablet Take 300 mg by mouth daily.       Marland Kitchen ALPRAZolam (XANAX) 0.5 MG tablet Take 0.5 mg by mouth at bedtime as needed. For sleep      . aspirin 81 MG tablet Take 81 mg by mouth every other day.       Marland Kitchen atenolol (TENORMIN) 50 MG tablet Take 50 mg by  mouth daily.       . cholecalciferol (VITAMIN D) 1000 UNITS tablet Take 2,000 Units by mouth daily.       . colchicine 0.6 MG tablet Take 0.6 mg by mouth as needed.      Marland Kitchen esomeprazole (NEXIUM) 40 MG capsule Take 40 mg by mouth as needed. For acid reflux      . furosemide (LASIX) 80 MG tablet Take 80 mg by mouth daily.      Marland Kitchen gabapentin (NEURONTIN) 100 MG capsule Take 100 mg by mouth 3 (three) times daily.       . Magnesium 250 MG TABS Take 1-2 tablets by mouth daily.      . meclizine (ANTIVERT) 12.5 MG tablet Take 6.25 mg by mouth 3 (three) times daily as needed. For dizziness       . metolazone (ZAROXOLYN) 2.5 MG tablet Take 2.5 mg by mouth. Take 30 mins before taking furosemide as needed      . mupirocin ointment (BACTROBAN) 2 % Apply 1 application topically daily.      . nitroGLYCERIN (NITROSTAT) 0.4 MG SL tablet Place 0.4 mg under the tongue every 5 (five) minutes as needed. For chest pain      . Omega-3 Fatty Acids (FISH OIL) 1000 MG CAPS Take 1 capsule by mouth daily.      . potassium chloride SA (K-DUR,KLOR-CON) 20 MEQ tablet Take 20 mEq by mouth 2 (two) times daily.       . traMADol (ULTRAM) 50 MG tablet Take 50 mg by mouth every 6 (six) hours as needed for pain.      Marland Kitchen warfarin (COUMADIN) 2.5 MG tablet Take 2.5 mg by mouth daily.      . diazepam (VALIUM) 5 MG tablet Please take 1 tablets (5mg ) 2 hours prior to procedure.  1 tablet  0   No current facility-administered medications for this visit.    LABS/IMAGING: No results found for this or any previous visit (from the past 48 hour(s)). No results found.  VITALS: BP 148/80  Pulse 72  Ht 5\' 4"  (1.626 m)  Wt 196 lb 3.2 oz (88.996 kg)  BMI 33.66 kg/m2  EXAM: deferred  EKG: deferred  ASSESSMENT: 1. CEAP 1, 2, 3, 4A, 4B, 6 venous disease  PLAN: 1.   [name] has advanced bilateral venous stasis disease, with a healing ulcer of the left lower extremity and a new venous ulcer on the right lower extremity. Venous  Doppler showed bilateral greater saphenous vein reflux, which is likely contributing to his venous stasis ulcers.  I discussed with her management possibilities including endovenous ablation. I think is a good candidate for greater saphenous vein ablation which will help speed healing to his ulcers. He  is interested in going forward with venous ablation and he provided informed consent today in the office. Will try to schedule him for the procedure within the next few weeks.  Thank you again for allow me to participate in this gentleman's care.  Chrystie Nose, MD, [facility] Attending Cardiologist [facility]  Dominigue Gellner C 05/16/2013, 1:25 PM

## 2013-05-22 ENCOUNTER — Encounter: Payer: Self-pay | Admitting: Internal Medicine

## 2013-05-22 ENCOUNTER — Ambulatory Visit (INDEPENDENT_AMBULATORY_CARE_PROVIDER_SITE_OTHER): Payer: Medicare Other | Admitting: *Deleted

## 2013-05-22 DIAGNOSIS — I4891 Unspecified atrial fibrillation: Secondary | ICD-10-CM

## 2013-05-22 DIAGNOSIS — Z950 Presence of cardiac pacemaker: Secondary | ICD-10-CM

## 2013-05-22 LAB — REMOTE PACEMAKER DEVICE
BAMS-0001: 150 {beats}/min
BRDY-0002RV: 60 {beats}/min
BRDY-0004RV: 120 {beats}/min
DEVICE MODEL PM: 7178130

## 2013-05-30 ENCOUNTER — Other Ambulatory Visit: Payer: Self-pay | Admitting: *Deleted

## 2013-05-30 DIAGNOSIS — I83893 Varicose veins of bilateral lower extremities with other complications: Secondary | ICD-10-CM

## 2013-06-13 ENCOUNTER — Encounter (HOSPITAL_BASED_OUTPATIENT_CLINIC_OR_DEPARTMENT_OTHER): Payer: Medicare Other | Attending: General Surgery

## 2013-06-13 DIAGNOSIS — L97909 Non-pressure chronic ulcer of unspecified part of unspecified lower leg with unspecified severity: Secondary | ICD-10-CM | POA: Insufficient documentation

## 2013-06-14 ENCOUNTER — Ambulatory Visit (INDEPENDENT_AMBULATORY_CARE_PROVIDER_SITE_OTHER): Payer: Medicare Other | Admitting: Internal Medicine

## 2013-06-14 DIAGNOSIS — I83893 Varicose veins of bilateral lower extremities with other complications: Secondary | ICD-10-CM

## 2013-06-14 NOTE — Patient Instructions (Signed)
   POST OPERATIVE INSTRUCTIONS FOR VNUS RF ABLATION PROCEDURE  1. Leave wrap on leg for 48 hours until your repeat ultrasound is performed; then we will remove and fit a thigh-high compression stocking that you should wear every day (usually 2-3 weeks). You may remove it at night or to shower. 2. You may experience drainage from the needle stick sites. This is normal and this fluid should be clear or pinkish. 3. You may take a sponge bath during the first 48 hours. Do not remove wrap to shower until after 48 hours. Shower is preferable to tub bath. You may gently bathe the incision or puncture site with warm water and antibacterial soap. 4. Rest with leg elevated. Ambulate (walk around) at least 15 minutes, 4-5 times a day. 5. IMMEDIATELY report to the office (answering service is available after hours (any complaints of increasing pain, swelling, redness, fever, leg or calf tightness, or shortness of breath. 6. You should experience only mild to moderate discomfort at the incision site. You may take acetaminophen (Tylenol) or ibuprofen (Advil, Motrin) as needed for pain. 7. No exercise for 2 weeks. 8. Take one aspirin (81 mg once per day until your return visit), unless you are already on aspirin, warfarin, or other blood thinners such as Xarelto, Pradaxa or Eliquis, for other reasons, which you should continue taking. 9. Return to the office in 1-2 days for a repeat venous ultrasound. 10. Return to the office in 2-3 weeks for followup exam, unless there are problems in which an earlier appointment can be scheduled. 11. Please don't hesitate to call our office at [phone], with any questions or concerns.  [name] C. Tekia Waterbury, MD, FACC Attending Cardiologist [facility]  

## 2013-06-15 ENCOUNTER — Encounter: Payer: Self-pay | Admitting: Internal Medicine

## 2013-06-15 ENCOUNTER — Other Ambulatory Visit: Payer: Self-pay

## 2013-06-16 ENCOUNTER — Ambulatory Visit (HOSPITAL_COMMUNITY)
Admission: RE | Admit: 2013-06-16 | Discharge: 2013-06-16 | Disposition: A | Discharge status: Home / Self Care | Payer: Medicare Other | Source: Ambulatory Visit | Attending: Internal Medicine | Admitting: Internal Medicine

## 2013-06-16 ENCOUNTER — Telehealth: Payer: Self-pay | Admitting: Internal Medicine

## 2013-06-16 DIAGNOSIS — Z48812 Encounter for surgical aftercare following surgery on the circulatory system: Secondary | ICD-10-CM

## 2013-06-16 DIAGNOSIS — I83893 Varicose veins of bilateral lower extremities with other complications: Secondary | ICD-10-CM

## 2013-06-16 DIAGNOSIS — I8390 Asymptomatic varicose veins of unspecified lower extremity: Secondary | ICD-10-CM | POA: Insufficient documentation

## 2013-06-16 NOTE — Telephone Encounter (Signed)
Entered patient's chart in error.

## 2013-06-16 NOTE — Progress Notes (Signed)
Left Lower Extremity Venous Duplex Completed. °[name] ° °

## 2013-06-19 ENCOUNTER — Encounter: Payer: Self-pay | Admitting: Internal Medicine

## 2013-06-30 ENCOUNTER — Encounter: Payer: Self-pay | Admitting: *Deleted

## 2013-07-03 ENCOUNTER — Ambulatory Visit (INDEPENDENT_AMBULATORY_CARE_PROVIDER_SITE_OTHER): Payer: Medicare Other | Admitting: Internal Medicine

## 2013-07-03 ENCOUNTER — Encounter: Payer: Self-pay | Admitting: Internal Medicine

## 2013-07-03 ENCOUNTER — Ambulatory Visit: Payer: Medicare Other | Admitting: Internal Medicine

## 2013-07-03 VITALS — BP 130/70 | HR 66 | Ht 64.0 in | Wt 194.4 lb

## 2013-07-03 DIAGNOSIS — I83893 Varicose veins of bilateral lower extremities with other complications: Secondary | ICD-10-CM

## 2013-07-03 NOTE — Patient Instructions (Addendum)
Your physician recommends that you schedule a follow-up appointment as needed  

## 2013-07-03 NOTE — Progress Notes (Signed)
OFFICE NOTE  Chief Complaint:  Venous stasis ulcer  Primary Care Physician: Alexander Mountain, MD  HPI:  [name] is a pleasant [name]-year-old male with a history of coronary artery disease, atrial fibrillation, Hodgkin's lymphoma status post chemotherapy x2, and multiple other medical problems. He was referred to me for evaluation of a venous ulcer on his left leg. This has developed over the past several months. He is noted to have long-standing venous stasis changes of the lower extremities, and has been treated at the [facility] care center at [name].  To spare her history is significant for varicose veins.  The os is a history of marked swelling and blisters of the lower extremities in the past. Currently he has a well dressed left lower extremity in the area of a venous stasis ulcer on the anterior shin. The right lower extremity is fitted with a compression stocking. He does report aching in his and heaviness in both legs with swelling and decreased sensation. He is also status post vein harvest from the right lower extremity for bypass surgery.  He is kindly referred for evaluation as a candidate for endovenous ablation.    Since his last visit, he underwent venous and arterial dopplers of both legs. The arterial dopplers demonstrate normal bilateral ABIs. The right posterior tibial artery appears occluded, this may be since birth.  Venous insufficiency studies demonstrate venous reflux bilaterally in the greater saphenous veins, which is likely contributing to recurrent venous ulcers. He said started developing a venous ulcer on the right shin.  [name] underwent ablation of the left greater saphenous vein, which was successful. Repeat Dopplers indicated successful closure and no evidence of DVT. He returns today noting some improvement in swelling however he still has some weeping edema in the left leg. This is soaking his dressings in leading to some dermatitis and poorly  healing skin lesions.  PMHx:  Past Medical History  Diagnosis Date  . Hypertension   . CHF (congestive heart failure)   . Arrhythmia     afib  . Neuropathy   . Cancer     lymphoma  . Heart attack 1986    Past Surgical History  Procedure Laterality Date  . Pacemaker insertion  08/2010  . Cardiac surgery    . Eye surgery  10/2006, 11/2006  . Back and knee surgeries  unknown  . Tonsillectomy  1972  . Nasal sinus surgery  1980  . Coronary artery bypass graft  1990  . Elbow surgery      r/t gout    FAMHx:  Family History  Problem Relation Age of Onset  . Diabetes type II Sister   . Heart disease Brother   . Diabetes type II Daughter     SOCHx:   reports that he quit smoking about 68 years ago. His smoking use included Cigarettes. He has a 15 pack-year smoking history. He has never used smokeless tobacco. He reports that he does not drink alcohol or use illicit drugs.  ALLERGIES:  Allergies  Allergen Reactions  . Aleve [Naproxen Sodium]     GI Upset  . Avelox [Moxifloxacin Hcl In Nacl] Other (See Comments)    Messed up lungs  . Codeine Other (See Comments)    unknown  . Digoxin Other (See Comments)    unknown  . Indomethacin Other (See Comments)    unknown  . Levaquin [Levofloxacin]     "affected lungs"  . Lipitor [Atorvastatin] Nausea Only  . Lorazepam Other (  See Comments)    unknown  . Norpace [Disopyramide Phosphate] Other (See Comments)    Urinary retention  . Pravachol [Pravastatin Sodium] Nausea Only  . Uloric [Febuxostat]   . Amlodipine Besylate Rash  . Azithromycin Rash    unknown  . Bactrim [Sulfamethoxazole-Tmp Ds] Rash  . Clinoril [Sulindac] Rash  . Percocet [Oxycodone-Acetaminophen] Rash  . Sulfonamide Derivatives Rash    ROS: A comprehensive review of systems was negative except for: Integument/breast: positive for skin color change and Left venous stasis ulcer  HOME MEDS: Current Outpatient Prescriptions  Medication Sig Dispense Refill   . acetaminophen (TYLENOL) 500 MG tablet Take 1,000 mg by mouth at bedtime.       Marland Kitchen allopurinol (ZYLOPRIM) 300 MG tablet Take 300 mg by mouth daily.       Marland Kitchen ALPRAZolam (XANAX) 0.5 MG tablet Take 0.5 mg by mouth at bedtime as needed. For sleep      . aspirin 81 MG tablet Take 81 mg by mouth every other day.       Marland Kitchen atenolol (TENORMIN) 50 MG tablet Take 50 mg by mouth daily.       . cholecalciferol (VITAMIN D) 1000 UNITS tablet Take 2,000 Units by mouth daily.       . colchicine 0.6 MG tablet Take 0.6 mg by mouth as needed.      Marland Kitchen esomeprazole (NEXIUM) 40 MG capsule Take 40 mg by mouth as needed. For acid reflux      . furosemide (LASIX) 80 MG tablet Take 80 mg by mouth daily.      Marland Kitchen gabapentin (NEURONTIN) 100 MG capsule Take 100 mg by mouth 3 (three) times daily.       . Magnesium 250 MG TABS Take 1-2 tablets by mouth daily.      . meclizine (ANTIVERT) 12.5 MG tablet Take 6.25 mg by mouth 3 (three) times daily as needed. For dizziness       . metolazone (ZAROXOLYN) 2.5 MG tablet Take 2.5 mg by mouth. Take 30 mins before taking furosemide as needed      . mupirocin ointment (BACTROBAN) 2 % Apply 1 application topically daily.      . nitroGLYCERIN (NITROSTAT) 0.4 MG SL tablet Place 0.4 mg under the tongue every 5 (five) minutes as needed. For chest pain      . Omega-3 Fatty Acids (FISH OIL) 1000 MG CAPS Take 1 capsule by mouth daily.      . potassium chloride SA (K-DUR,KLOR-CON) 20 MEQ tablet Take 20 mEq by mouth 2 (two) times daily.       . traMADol (ULTRAM) 50 MG tablet Take 50 mg by mouth every 6 (six) hours as needed for pain.      Marland Kitchen warfarin (COUMADIN) 2.5 MG tablet Take 2.5 mg by mouth daily.       No current facility-administered medications for this visit.    LABS/IMAGING: No results found for this or any previous visit (from the past 48 hour(s)). No results found.  VITALS: BP 130/70  Pulse 66  Ht 5\' 4"  (1.626 m)  Wt 194 lb 6.4 oz (88.179 kg)  BMI 33.35  kg/m2  EXAM: deferred  EKG: deferred  ASSESSMENT: 1. CEAP 1, 2, 3, 4A, 4B, 6 venous disease  PLAN: 1.   [name] had successful left greater saphenous vein ablation. I do believe there is less swelling in the left leg although he still having some weeping edema and soaked dressings which is leading to skin irritation. He  is getting weekly wound care visits and home health nursing to change dressings. I think once this issue improves we will see again less swelling from the ablation, but still some of the weeping and swelling which is likely due to his peripheral neuropathy. He also has reflux in the right greater saphenous vein and if he has significant improvement in the left leg, he is a good candidate for closure of the right greater saphenous vein. I asked him to contact me if he wants to proceed with that in the future.  Thank you again for allow me to participate in this gentleman's care.  Chrystie Nose, MD, [facility] Attending Cardiologist [facility]  Kiamesha Samet C 07/03/2013, 1:14 PM

## 2013-07-04 ENCOUNTER — Other Ambulatory Visit: Payer: Self-pay

## 2013-07-04 ENCOUNTER — Ambulatory Visit: Payer: Self-pay | Admitting: Internal Medicine

## 2013-07-26 IMAGING — NM NM BONE 3 PHASE
2 series · 12 of 12 positions shown · non-contrast
Comparison: None.

CLINICAL DATA: Left ankle pain,  edema

NUCLEAR MEDICINE THREE PHASE BONE SCAN
TECHNIQUE: Radionuclide angiographic images, immediate static
blood pool images, and 3-hour delayed static images were obtained
after intravenous injection of radiopharmaceutical.
Radiopharmaceutical: One.  Technetium 99 MDP

[3 phase bone · 9.33mm/px · 6 of 48 frames shown (1 of 2)]
[frame 5/48  full-range]
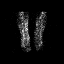
[frame 13/48  full-range]
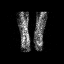
[frame 21/48  full-range]
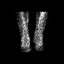
[frame 29/48  full-range]
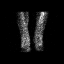
[frame 37/48  full-range]
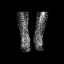
[frame 45/48  full-range]
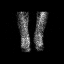

[3 phase bone · 9.33mm/px · 6 of 48 frames shown (2 of 2)]
[frame 5/48  full-range]
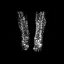
[frame 13/48  full-range]
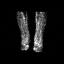
[frame 21/48  full-range]
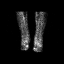
[frame 29/48  full-range]
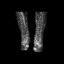
[frame 37/48  full-range]
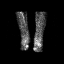
[frame 45/48  full-range]
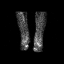

[12 of 12 positions shown; findings below may reference images not displayed]

FINDINGS: Vascular phase:  There is no significant asymmetric or increased
vascular flow to the left or right extremity.

Blood pool there is a focal activity within the tarsal bones on the
left and right as well as the distal tibia-fibula. Focal uptake is
also present at the first metatarsal phalangeal joint on the left
and right.

Delayed phase:  There is focal activity within essentially all the
tarsal bones the left and right as well as the distal tibia-fibula
on the left and right.  Focal uptake within the first metatarsal
phalangeal joint on the left and right.  This pattern matches the
blood pool activity.
IMPRESSION: Pattern most suggestive of moderate severe osteoarthritis of the
midfoot and tibiotalar joint.  Cannot exclude a Charcot's type
pattern although less favored.  No convincing evidence of
osteomyelitis or reflex sympathetic dystrophy.  No evidence of
fracture. Recommend correlation with plain films.

## 2013-08-01 ENCOUNTER — Encounter (HOSPITAL_BASED_OUTPATIENT_CLINIC_OR_DEPARTMENT_OTHER): Payer: Medicare Other | Attending: General Surgery

## 2013-08-01 DIAGNOSIS — I87319 Chronic venous hypertension (idiopathic) with ulcer of unspecified lower extremity: Secondary | ICD-10-CM | POA: Insufficient documentation

## 2013-08-01 DIAGNOSIS — L97909 Non-pressure chronic ulcer of unspecified part of unspecified lower leg with unspecified severity: Secondary | ICD-10-CM | POA: Insufficient documentation

## 2013-08-21 ENCOUNTER — Encounter: Payer: Medicare Other | Admitting: Internal Medicine

## 2013-08-24 ENCOUNTER — Ambulatory Visit (INDEPENDENT_AMBULATORY_CARE_PROVIDER_SITE_OTHER): Payer: Medicare Other | Admitting: Internal Medicine

## 2013-08-24 ENCOUNTER — Encounter: Payer: Self-pay | Admitting: Internal Medicine

## 2013-08-24 VITALS — BP 148/82 | HR 76 | Temp 97.8°F | Wt 190.0 lb

## 2013-08-24 DIAGNOSIS — T889XXS Complication of surgical and medical care, unspecified, sequela: Secondary | ICD-10-CM

## 2013-08-24 DIAGNOSIS — IMO0001 Reserved for inherently not codable concepts without codable children: Secondary | ICD-10-CM

## 2013-08-24 MED ORDER — DOXYCYCLINE HYCLATE 100 MG PO TABS: 100.0000 mg | ORAL_TABLET | Freq: Two times a day (BID) | ORAL | Status: DC

## 2013-08-24 NOTE — Progress Notes (Signed)
[facility] CLINIC NOTE  RFV: community referral for chronic wound Subjective:    Patient ID: [name], male    DOB: July 13, 1923, 79 y.o.   MRN: [id number]  HPI [name] is 79yo Male with mutiple medical problems  Has had chronic ulcers that did not improve after vascular surgery. He still has ongoing drainage and wound is stalled in healing. Has been at wound care center for the past 12 months. Has bandages changed 3 times per week. Up from weekly dressing which had too much weepage and macerating his legs. He previously was in a Print production planner with calamine lotion that improved abit but then worsened with weekly changes -> since skin would stick to the dressing and essentially worsened his leg ulcers.  Had recent wound cx with mrsa and stenotrophomonas at end of 08/08/13. Currently getting triple layer dressing changes 3 x per week  By amy, wound care RN.   Allergies  Allergen Reactions  . Aleve [Naproxen Sodium]     GI Upset  . Avelox [Moxifloxacin Hcl In Nacl] Other (See Comments)    Messed up lungs  . Codeine Other (See Comments)    unknown  . Digoxin Other (See Comments)    unknown  . Indomethacin Other (See Comments)    unknown  . Levaquin [Levofloxacin]     "affected lungs"  . Lipitor [Atorvastatin] Nausea Only  . Lorazepam Other (See Comments)    unknown  . Norpace [Disopyramide Phosphate] Other (See Comments)    Urinary retention  . Pravachol [Pravastatin Sodium] Nausea Only  . Uloric [Febuxostat]   . Amlodipine Besylate Rash  . Azithromycin Rash    unknown  . Bactrim [Sulfamethoxazole-Tmp Ds] Rash  . Clinoril [Sulindac] Rash  . Percocet [Oxycodone-Acetaminophen] Rash  . Sulfonamide Derivatives Rash  - able to tolerate doxycycline.    Current Outpatient Prescriptions on File Prior to Visit  Medication Sig Dispense Refill  . acetaminophen (TYLENOL) 500 MG tablet Take 1,000 mg by mouth at bedtime.       Marland Kitchen allopurinol (ZYLOPRIM) 300 MG tablet Take 300 mg by mouth  daily.       Marland Kitchen ALPRAZolam (XANAX) 0.5 MG tablet Take 0.5 mg by mouth at bedtime as needed. For sleep      . aspirin 81 MG tablet Take 81 mg by mouth every other day.       Marland Kitchen atenolol (TENORMIN) 50 MG tablet Take 50 mg by mouth daily.       . cholecalciferol (VITAMIN D) 1000 UNITS tablet Take 2,000 Units by mouth daily.       . colchicine 0.6 MG tablet Take 0.6 mg by mouth as needed.      Marland Kitchen esomeprazole (NEXIUM) 40 MG capsule Take 40 mg by mouth as needed. For acid reflux      . furosemide (LASIX) 80 MG tablet Take 80 mg by mouth daily.      Marland Kitchen gabapentin (NEURONTIN) 100 MG capsule Take 100 mg by mouth 3 (three) times daily.       . Magnesium 250 MG TABS Take 1-2 tablets by mouth daily.      . meclizine (ANTIVERT) 12.5 MG tablet Take 6.25 mg by mouth 3 (three) times daily as needed. For dizziness       . metolazone (ZAROXOLYN) 2.5 MG tablet Take 2.5 mg by mouth. Take 30 mins before taking furosemide as needed      . mupirocin ointment (BACTROBAN) 2 % Apply 1 application topically daily.      Marland Kitchen  nitroGLYCERIN (NITROSTAT) 0.4 MG SL tablet Place 0.4 mg under the tongue every 5 (five) minutes as needed. For chest pain      . Omega-3 Fatty Acids (FISH OIL) 1000 MG CAPS Take 1 capsule by mouth daily.      . potassium chloride SA (K-DUR,KLOR-CON) 20 MEQ tablet Take 20 mEq by mouth 2 (two) times daily.       . traMADol (ULTRAM) 50 MG tablet Take 50 mg by mouth every 6 (six) hours as needed for pain.      Marland Kitchen warfarin (COUMADIN) 2.5 MG tablet Take 2.5 mg by mouth daily.       No current facility-administered medications on file prior to visit.   Active Ambulatory Problems    Diagnosis Date Noted  . Atrial fibrillation 12/02/2010  . Chronic diastolic heart failure 12/02/2010  . PPM-St.Jude 12/02/2010  . Coronary artery disease 11/29/2011  . Stasis leg ulcer 11/29/2011  . Neuropathy 11/29/2011  . Generalized weakness 11/29/2011  . Hx of CABG 04/25/2013  . Varicose veins of lower extremities with  other complications 05/16/2013   Resolved Ambulatory Problems    Diagnosis Date Noted  . No Resolved Ambulatory Problems   Past Medical History  Diagnosis Date  . Hypertension   . CHF (congestive heart failure)   . Arrhythmia   . Cancer   . Heart attack 1986    History  Substance Use Topics  . Smoking status: Former Smoker -- 1.00 packs/day for 15 years    Types: Cigarettes    Quit date: 11/13/1944  . Smokeless tobacco: Never Used  . Alcohol Use: No  family history includes Diabetes type II in his daughter and sister; Heart disease in his brother.   Review of Systems 12 point ROs is negative other than occ pain associated with dressings.    Objective:   Physical Exam BP 148/82  Pulse 76  Temp(Src) 97.8 F (36.6 C) (Oral)  Wt 190 lb (86.183 kg)  BMI 32.6 kg/m2 gen = a xo by 3, elderly male in NAD Ext: trace edema to left lower extremity, right leg is wrapped Skin ; dressing on left leg removed to reveal lower extremity c/w chronic venous stasis with hemosiderin deposition. He has large ulcer of 7cm wide by 10cm length with wound bed that has some scattered heavy exudate in wound bed. The entire lower extremity has silveralgenate cream. He has pinpoint areas of serous drainage. Heavy onychomycosis to nailbeds       Assessment & Plan:   chronic wound infection = will do a trial of doxycycline 100mg  BID x 30 days for deep tissue infection. Continue with TIW dressing changes. We will have him come back in 2 wks to assess improvement   rtc in 2 wk  40 min spent with patient awiht greater than 50% in counseling

## 2013-09-05 ENCOUNTER — Ambulatory Visit (INDEPENDENT_AMBULATORY_CARE_PROVIDER_SITE_OTHER): Payer: Medicare Other | Admitting: Internal Medicine

## 2013-09-05 ENCOUNTER — Encounter: Payer: Self-pay | Admitting: Internal Medicine

## 2013-09-05 ENCOUNTER — Encounter (HOSPITAL_BASED_OUTPATIENT_CLINIC_OR_DEPARTMENT_OTHER): Payer: Medicare Other | Attending: General Surgery

## 2013-09-05 VITALS — BP 139/76 | HR 70 | Ht 64.0 in | Wt 191.0 lb

## 2013-09-05 DIAGNOSIS — I4891 Unspecified atrial fibrillation: Secondary | ICD-10-CM

## 2013-09-05 DIAGNOSIS — I5032 Chronic diastolic (congestive) heart failure: Secondary | ICD-10-CM

## 2013-09-05 DIAGNOSIS — Z950 Presence of cardiac pacemaker: Secondary | ICD-10-CM

## 2013-09-05 LAB — PACEMAKER DEVICE OBSERVATION
AL AMPLITUDE: 1 mv
AL IMPEDENCE PM: 387.5 Ohm
BAMS-0001: 150 {beats}/min
BATTERY VOLTAGE: 2.9478 V
RV LEAD AMPLITUDE: 12 mv
RV LEAD IMPEDENCE PM: 925 Ohm

## 2013-09-05 NOTE — Assessment & Plan Note (Signed)
His St. Jude DDD PM is working normally. Will recheck in several months. 

## 2013-09-05 NOTE — Progress Notes (Signed)
HPI Mr. Elenes returns today for followup. He is a very pleasant 79 yo man with a history of symptomatic tachybradycardia syndrome , status post permanent pacemaker insertion. He now has chronic atrial fibrillation. Despite this, he is stable. He denies palpitations, chest pain, or syncope. His medications have been adjusted. His heart failure symptoms are class II. His main problem is chronic venous insufficiency and the propensity for cellulitis. He denies fever or chills. He has been to the wound care center for nearly a year.  Allergies  Allergen Reactions  . Aleve [Naproxen Sodium]     GI Upset  . Avelox [Moxifloxacin Hcl In Nacl] Other (See Comments)    Messed up lungs  . Codeine Other (See Comments)    unknown  . Digoxin Other (See Comments)    unknown  . Indomethacin Other (See Comments)    unknown  . Levaquin [Levofloxacin]     "affected lungs"  . Lipitor [Atorvastatin] Nausea Only  . Lorazepam Other (See Comments)    unknown  . Norpace [Disopyramide Phosphate] Other (See Comments)    Urinary retention  . Pravachol [Pravastatin Sodium] Nausea Only  . Uloric [Febuxostat]   . Amlodipine Besylate Rash  . Azithromycin Rash    unknown  . Bactrim [Sulfamethoxazole-Tmp Ds] Rash  . Clinoril [Sulindac] Rash  . Percocet [Oxycodone-Acetaminophen] Rash  . Sulfonamide Derivatives Rash     Current Outpatient Prescriptions  Medication Sig Dispense Refill  . acetaminophen (TYLENOL) 500 MG tablet Take 1,000 mg by mouth at bedtime.       Marland Kitchen allopurinol (ZYLOPRIM) 300 MG tablet Take 300 mg by mouth daily.       Marland Kitchen ALPRAZolam (XANAX) 0.5 MG tablet Take 0.5 mg by mouth at bedtime as needed. For sleep      . aspirin 81 MG tablet Take 81 mg by mouth every other day.       Marland Kitchen atenolol (TENORMIN) 50 MG tablet Take 50 mg by mouth daily.       . cholecalciferol (VITAMIN D) 1000 UNITS tablet Take 2,000 Units by mouth daily.       . colchicine 0.6 MG tablet Take 0.6 mg by mouth as needed.      .  doxycycline (VIBRA-TABS) 100 MG tablet Take 1 tablet (100 mg total) by mouth 2 (two) times daily.  60 tablet  0  . furosemide (LASIX) 80 MG tablet Take 80 mg by mouth daily.      Marland Kitchen gabapentin (NEURONTIN) 100 MG capsule Take 100 mg by mouth 3 (three) times daily.       . Magnesium 250 MG TABS Take 1-2 tablets by mouth daily.      . meclizine (ANTIVERT) 12.5 MG tablet Take 6.25 mg by mouth 3 (three) times daily as needed. For dizziness       . metolazone (ZAROXOLYN) 2.5 MG tablet Take 2.5 mg by mouth. Take 30 mins before taking furosemide as needed      . mupirocin ointment (BACTROBAN) 2 % Apply 1 application topically as needed.       . nitroGLYCERIN (NITROSTAT) 0.4 MG SL tablet Place 0.4 mg under the tongue every 5 (five) minutes as needed. For chest pain      . Omega-3 Fatty Acids (FISH OIL) 1000 MG CAPS Take 1 capsule by mouth daily.      . potassium chloride SA (K-DUR,KLOR-CON) 20 MEQ tablet Take 20 mEq by mouth 2 (two) times daily.       Marland Kitchen warfarin (COUMADIN) 2.5 MG  tablet Take 2.5 mg by mouth daily.       No current facility-administered medications for this visit.     Past Medical History  Diagnosis Date  . Hypertension   . CHF (congestive heart failure)   . Arrhythmia     afib  . Neuropathy   . Cancer     lymphoma  . Heart attack 1986    ROS:   All systems reviewed and negative except as noted in the HPI.   Past Surgical History  Procedure Laterality Date  . Pacemaker insertion  08/2010  . Cardiac surgery    . Eye surgery  10/2006, 11/2006  . Back and knee surgeries  unknown  . Tonsillectomy  1972  . Nasal sinus surgery  1980  . Coronary artery bypass graft  1990  . Elbow surgery      r/t gout     Family History  Problem Relation Age of Onset  . Diabetes type II Sister   . Heart disease Brother   . Diabetes type II Daughter      History   Social History  . Marital Status: Widowed    Spouse Name: N/A    Number of Children: N/A  . Years of Education:  N/A   Occupational History  . Not on file.   Social History Main Topics  . Smoking status: Former Smoker -- 1.00 packs/day for 15 years    Types: Cigarettes    Quit date: 11/13/1944  . Smokeless tobacco: Never Used  . Alcohol Use: No  . Drug Use: No  . Sexual Activity: Yes    Birth Control/ Protection: Diaphragm   Other Topics Concern  . Not on file   Social History Narrative  . No narrative on file     BP 139/76  Pulse 70  Ht 5\' 4"  (1.626 m)  Wt 191 lb (86.637 kg)  BMI 32.77 kg/m2  Physical Exam:  Well appearing elderly man, NAD HEENT: Unremarkable Neck:  No JVD, no thyromegally Lungs:  Clear with no wheezes, rales, or rhonchi. HEART:  Regular rate rhythm, no murmurs, no rubs, no clicks Abd:  soft, positive bowel sounds, no organomegally, no rebound, no guarding Ext:  2 plus pulses, no edema, no cyanosis, no clubbing Skin:  No rashes no nodules Neuro:  CN II through XII intact, motor grossly intact   DEVICE  Normal device function.  See PaceArt for details.   Assess/Plan:

## 2013-09-05 NOTE — Assessment & Plan Note (Signed)
His symptoms are more right than left sided. He does have venous insufficiency which compounds the problem. Will continue his current meds. I have counseled him on the importance of a reduced sodium diet.

## 2013-09-05 NOTE — Patient Instructions (Signed)
Your physician wants you to follow-up in: 12 months with Dr Court Joy will receive a reminder letter in the mail two months in advance. If you don't receive a letter, please call our office to schedule the follow-up appointment.  Remote monitoring is used to monitor your Pacemaker or ICD from home. This monitoring reduces the number of office visits required to check your device to one time per year. It allows [place] to keep an eye on the functioning of your device to ensure it is working properly. You are scheduled for a device check from home on 12/06/13. You may send your transmission at any time that day. If you have a wireless device, the transmission will be sent automatically. After your physician reviews your transmission, you will receive a postcard with your next transmission date.

## 2013-09-06 ENCOUNTER — Ambulatory Visit: Payer: Medicare Other | Admitting: Internal Medicine

## 2013-09-11 ENCOUNTER — Encounter: Payer: Self-pay | Admitting: Interventional Cardiology

## 2013-09-12 ENCOUNTER — Encounter: Payer: Self-pay | Admitting: Interventional Cardiology

## 2013-09-12 ENCOUNTER — Ambulatory Visit: Payer: Medicare Other | Admitting: Internal Medicine

## 2013-09-12 ENCOUNTER — Ambulatory Visit (INDEPENDENT_AMBULATORY_CARE_PROVIDER_SITE_OTHER): Payer: Medicare Other | Admitting: Interventional Cardiology

## 2013-09-12 VITALS — BP 122/80 | HR 79 | Ht 64.0 in | Wt 193.0 lb

## 2013-09-12 DIAGNOSIS — I2510 Atherosclerotic heart disease of native coronary artery without angina pectoris: Secondary | ICD-10-CM

## 2013-09-12 DIAGNOSIS — I4891 Unspecified atrial fibrillation: Secondary | ICD-10-CM

## 2013-09-12 DIAGNOSIS — Z950 Presence of cardiac pacemaker: Secondary | ICD-10-CM

## 2013-09-12 DIAGNOSIS — I5032 Chronic diastolic (congestive) heart failure: Secondary | ICD-10-CM

## 2013-09-12 NOTE — Progress Notes (Signed)
Patient ID: [name], male   DOB: [birth year], 79 y.o.   MRN: [id number]    [address] [place], [place]  [address] Phone: [phone] Fax:  [phone]  Date:  09/12/2013   ID:  [name], DOB [birth year], MRN [id number]  PCP:  Lillia Mountain, MD   ASSESSMENT:  1. CAD with no angina 2. Chronic diastolic heart failure, stable 3. Hypertension controlled 4. Atrial fibrillation with adequate rate control 5. Anticoagulation therapy, chronic, and followed by his primary physician.  PLAN:  1. No change in therapy 2. No bleeding on coumadin. Continue to follow with primary physician. 3. 1 year f/u PRN   SUBJECTIVE: [name] is a 79 y.o. male is doing well from cardiac standpoint. He denies angina, dyspnea, change in chronic lower extremity edema do to venous insufficiency, palpitations, syncope, and transient neurological symptoms. No medication side effects. No bleeding on Coumadin.   Wt Readings from Last 3 Encounters:  09/12/13 193 lb (87.544 kg)  09/05/13 191 lb (86.637 kg)  08/24/13 190 lb (86.183 kg)     Past Medical History  Diagnosis Date  . Hypertension   . CHF (congestive heart failure)   . Arrhythmia     afib  . Neuropathy   . Cancer     lymphoma  . Heart attack 1986    Current Outpatient Prescriptions  Medication Sig Dispense Refill  . acetaminophen (TYLENOL) 500 MG tablet Take 1,000 mg by mouth at bedtime.       Marland Kitchen allopurinol (ZYLOPRIM) 300 MG tablet Take 300 mg by mouth daily.       Marland Kitchen ALPRAZolam (XANAX) 0.5 MG tablet Take 0.5 mg by mouth at bedtime as needed. For sleep      . aspirin 81 MG tablet Take 81 mg by mouth every other day.       Marland Kitchen atenolol (TENORMIN) 50 MG tablet Take 50 mg by mouth daily.       . cholecalciferol (VITAMIN D) 1000 UNITS tablet Take 2,000 Units by mouth daily.       . colchicine 0.6 MG tablet Take 0.6 mg by mouth as needed.      . doxycycline (VIBRA-TABS) 100 MG tablet Take 1 tablet (100 mg  total) by mouth 2 (two) times daily.  60 tablet  0  . furosemide (LASIX) 80 MG tablet Take 80 mg by mouth daily.      Marland Kitchen gabapentin (NEURONTIN) 100 MG capsule Take 100 mg by mouth 3 (three) times daily.       . Magnesium 250 MG TABS Take 1-2 tablets by mouth daily.      . meclizine (ANTIVERT) 12.5 MG tablet Take 6.25 mg by mouth 3 (three) times daily as needed. For dizziness       . metolazone (ZAROXOLYN) 2.5 MG tablet Take 2.5 mg by mouth. Take 30 mins before taking furosemide as needed      . mupirocin ointment (BACTROBAN) 2 % Apply 1 application topically as needed.       . nitroGLYCERIN (NITROSTAT) 0.4 MG SL tablet Place 0.4 mg under the tongue every 5 (five) minutes as needed. For chest pain      . Omega-3 Fatty Acids (FISH OIL) 1000 MG CAPS Take 1 capsule by mouth daily.      . potassium chloride SA (K-DUR,KLOR-CON) 20 MEQ tablet Take 20 mEq by mouth 2 (two) times daily.       Marland Kitchen warfarin (COUMADIN) 2.5 MG tablet Take  2.5 mg by mouth daily.       No current facility-administered medications for this visit.    Allergies:    Allergies  Allergen Reactions  . Aleve [Naproxen Sodium]     GI Upset  . Avelox [Moxifloxacin Hcl In Nacl] Other (See Comments)    Messed up lungs  . Codeine Other (See Comments)    unknown  . Digoxin Other (See Comments)    unknown  . Indomethacin Other (See Comments)    unknown  . Levaquin [Levofloxacin]     "affected lungs"  . Lipitor [Atorvastatin] Nausea Only  . Lorazepam Other (See Comments)    unknown  . Norpace [Disopyramide Phosphate] Other (See Comments)    Urinary retention  . Pravachol [Pravastatin Sodium] Nausea Only  . Uloric [Febuxostat]   . Amlodipine Besylate Rash  . Azithromycin Rash    unknown  . Bactrim [Sulfamethoxazole-Tmp Ds] Rash  . Clinoril [Sulindac] Rash  . Percocet [Oxycodone-Acetaminophen] Rash  . Sulfonamide Derivatives Rash    Social History:  The patient  reports that he quit smoking about 68 years ago. His smoking  use included Cigarettes. He has a 15 pack-year smoking history. He has never used smokeless tobacco. He reports that he does not drink alcohol or use illicit drugs.   ROS:  Please see the history of present illness.   Appetite stable. No weight loss. No bleeding or head trauma. Has had recent falls but no significant injury. Lives alone.   All other systems reviewed and negative.   OBJECTIVE: VS:  BP 122/80  Pulse 79  Ht 5\' 4"  (1.626 m)  Wt 193 lb (87.544 kg)  BMI 33.11 kg/m2 Well nourished, well developed, in no acute distress, elderly, appears stated age, HEENT: normal Neck: JVD moderate elevation. Carotid bruit absent  Cardiac:  normal S1, S2; RRR; no murmur Lungs:  clear to auscultation bilaterally, no wheezing, rhonchi or rales Abd: soft, nontender, no hepatomegaly Ext: Edema 2+ bilateral despite moderate tension support stockings. Pulses not palpated Skin: warm and dry Neuro:  CNs 2-12 intact, no focal abnormalities noted  EKG:  Atrial fibrillation with controlled ventricular response at 79 beats per minute, left bundle branch block, rightward axis. Marked T-wave abnormality inferolaterally. Unchanged from prior.       Signed, Darci Needle III, MD 09/12/2013 9:58 AM

## 2013-09-12 NOTE — Patient Instructions (Signed)
Your physician recommends that you continue on your current medications as directed. Please refer to the Current Medication list given to you today.  Your physician recommends that you schedule a follow-up appointment in: As needed 

## 2013-09-13 ENCOUNTER — Encounter: Payer: Self-pay | Admitting: Internal Medicine

## 2013-09-13 ENCOUNTER — Ambulatory Visit (INDEPENDENT_AMBULATORY_CARE_PROVIDER_SITE_OTHER): Payer: Medicare Other | Admitting: Internal Medicine

## 2013-09-13 VITALS — BP 130/84 | HR 76 | Temp 98.4°F | Wt 194.0 lb

## 2013-09-13 DIAGNOSIS — I83009 Varicose veins of unspecified lower extremity with ulcer of unspecified site: Secondary | ICD-10-CM

## 2013-09-13 DIAGNOSIS — L97909 Non-pressure chronic ulcer of unspecified part of unspecified lower leg with unspecified severity: Secondary | ICD-10-CM

## 2013-09-13 DIAGNOSIS — I83029 Varicose veins of left lower extremity with ulcer of unspecified site: Secondary | ICD-10-CM

## 2013-09-13 NOTE — Progress Notes (Signed)
Patient ID: LAURI TILL, male   DOB: 1923/04/25, 79 y.o.   MRN: [id number]         [facility] for Infectious Disease  Patient Active Problem List   Diagnosis Date Noted  . Varicose veins of lower extremities with other complications 05/16/2013  . Hx of CABG 04/25/2013  . Coronary artery disease 11/29/2011  . Stasis leg ulcer 11/29/2011  . Neuropathy 11/29/2011  . Generalized weakness 11/29/2011  . Atrial fibrillation 12/02/2010  . Chronic diastolic heart failure 12/02/2010  . PPM-St.Jude 12/02/2010    Patient's Medications  New Prescriptions   No medications on file  Previous Medications   ACETAMINOPHEN (TYLENOL) 500 MG TABLET    Take 1,000 mg by mouth at bedtime.    ALLOPURINOL (ZYLOPRIM) 300 MG TABLET    Take 300 mg by mouth daily.    ALPRAZOLAM (XANAX) 0.5 MG TABLET    Take 0.5 mg by mouth at bedtime as needed. For sleep   ASPIRIN 81 MG TABLET    Take 81 mg by mouth every other day.    ATENOLOL (TENORMIN) 50 MG TABLET    Take 50 mg by mouth daily.    CHOLECALCIFEROL (VITAMIN D) 1000 UNITS TABLET    Take 2,000 Units by mouth daily.    COLCHICINE 0.6 MG TABLET    Take 0.6 mg by mouth as needed.   DOXYCYCLINE (VIBRA-TABS) 100 MG TABLET    Take 1 tablet (100 mg total) by mouth 2 (two) times daily.   FUROSEMIDE (LASIX) 80 MG TABLET    Take 80 mg by mouth daily.   GABAPENTIN (NEURONTIN) 100 MG CAPSULE    Take 100 mg by mouth 3 (three) times daily.    MAGNESIUM 250 MG TABS    Take 1-2 tablets by mouth daily.   MECLIZINE (ANTIVERT) 12.5 MG TABLET    Take 6.25 mg by mouth 3 (three) times daily as needed. For dizziness    METOLAZONE (ZAROXOLYN) 2.5 MG TABLET    Take 2.5 mg by mouth. Take 30 mins before taking furosemide as needed   MUPIROCIN OINTMENT (BACTROBAN) 2 %    Apply 1 application topically as needed.    NITROGLYCERIN (NITROSTAT) 0.4 MG SL TABLET    Place 0.4 mg under the tongue every 5 (five) minutes as needed. For chest pain   OMEGA-3 FATTY ACIDS (FISH OIL) 1000 MG  CAPS    Take 1 capsule by mouth daily.   POTASSIUM CHLORIDE SA (K-DUR,KLOR-CON) 20 MEQ TABLET    Take 20 mEq by mouth 2 (two) times daily.    WARFARIN (COUMADIN) 2.5 MG TABLET    Take 2.5 mg by mouth daily.  Modified Medications   No medications on file  Discontinued Medications   No medications on file    Subjective: Mr. Gallardo is in for his followup visit. He has chronic venous stasis and has had some poorly healing ulcers of his left lower leg for several months. He underwent ablation of his left greater saphenous vein in August. He has been working with his home care nurse who has been using compression dressings 3 times weekly for the past 2 months. He saw my partner, Sherrin Daisy, 2 weeks ago was started on doxycycline. He has noticed significant improvement in the past 2 weeks with decreased pain, decreased drainage and his son notes that the sores are getting shallower and smaller in size. He has had some problems recently with elevated INR but otherwise is tolerating doxycycline well. Review of  Systems: Pertinent items are noted in HPI.  Past Medical History  Diagnosis Date  . Hypertension   . CHF (congestive heart failure)   . Arrhythmia     afib  . Neuropathy   . Cancer     lymphoma  . Heart attack 1986    History  Substance Use Topics  . Smoking status: Former Smoker -- 1.00 packs/day for 15 years    Types: Cigarettes    Quit date: 11/13/1944  . Smokeless tobacco: Never Used  . Alcohol Use: No    Family History  Problem Relation Age of Onset  . Diabetes type II Sister   . Heart disease Brother   . Diabetes type II Daughter     Allergies  Allergen Reactions  . Aleve [Naproxen Sodium]     GI Upset  . Avelox [Moxifloxacin Hcl In Nacl] Other (See Comments)    Messed up lungs  . Codeine Other (See Comments)    unknown  . Digoxin Other (See Comments)    unknown  . Indomethacin Other (See Comments)    unknown  . Levaquin [Levofloxacin]     "affected  lungs"  . Lipitor [Atorvastatin] Nausea Only  . Lorazepam Other (See Comments)    unknown  . Norpace [Disopyramide Phosphate] Other (See Comments)    Urinary retention  . Pravachol [Pravastatin Sodium] Nausea Only  . Uloric [Febuxostat]   . Amlodipine Besylate Rash  . Azithromycin Rash    unknown  . Bactrim [Sulfamethoxazole-Tmp Ds] Rash  . Clinoril [Sulindac] Rash  . Percocet [Oxycodone-Acetaminophen] Rash  . Sulfonamide Derivatives Rash    Objective: Temp: 98.4 F (36.9 C) (11/05 1503) Temp src: Oral (11/05 1503) BP: 130/84 mmHg (11/05 1503) Pulse Rate: 76 (11/05 1503)  General: He is in good spirits Skin: He has chronic venous stasis dermatitis of his left leg with some dusky, chronic erythema over the dorsum of the foot. He has 2 irregularly-shaped ulcer was about 1-1/2 inches in greatest diameter over the lateral aspect of his left lower leg. There is some yellow drainage with slight odor on his gauze dressing    Assessment: He has had significant improvement coincident with starting doxycycline 2 and half weeks ago. He has about 10 more days remaining on his prescription which I will have him complete.  Plan: 1. Complete 10 more days of doxycycline 2. Followup in 2 weeks   Cliffton Asters, MD [facility] for Infectious Disease [facility] [phone] pager   [phone] cell 09/13/2013, 3:37 PM

## 2013-09-26 ENCOUNTER — Ambulatory Visit (INDEPENDENT_AMBULATORY_CARE_PROVIDER_SITE_OTHER): Payer: Medicare Other | Admitting: Internal Medicine

## 2013-09-26 ENCOUNTER — Encounter: Payer: Self-pay | Admitting: Internal Medicine

## 2013-09-26 VITALS — BP 157/79 | HR 80 | Temp 97.8°F | Ht 64.0 in | Wt 190.5 lb

## 2013-09-26 DIAGNOSIS — L97909 Non-pressure chronic ulcer of unspecified part of unspecified lower leg with unspecified severity: Secondary | ICD-10-CM

## 2013-09-26 DIAGNOSIS — I83029 Varicose veins of left lower extremity with ulcer of unspecified site: Secondary | ICD-10-CM

## 2013-09-26 DIAGNOSIS — I83009 Varicose veins of unspecified lower extremity with ulcer of unspecified site: Secondary | ICD-10-CM

## 2013-09-26 NOTE — Progress Notes (Signed)
Patient ID: [name], male   DOB: Oct 02, 1923, 79 y.o.   MRN: [id number]         [facility] for Infectious Disease  Patient Active Problem List   Diagnosis Date Noted  . Varicose veins of lower extremities with other complications 05/16/2013  . Hx of CABG 04/25/2013  . Coronary artery disease 11/29/2011  . Stasis leg ulcer 11/29/2011  . Neuropathy 11/29/2011  . Generalized weakness 11/29/2011  . Atrial fibrillation 12/02/2010  . Chronic diastolic heart failure 12/02/2010  . PPM-St.Jude 12/02/2010    Patient's Medications  New Prescriptions   No medications on file  Previous Medications   ACETAMINOPHEN (TYLENOL) 500 MG TABLET    Take 1,000 mg by mouth at bedtime.    ALLOPURINOL (ZYLOPRIM) 300 MG TABLET    Take 300 mg by mouth daily.    ALPRAZOLAM (XANAX) 0.5 MG TABLET    Take 0.5 mg by mouth at bedtime as needed. For sleep   ASPIRIN 81 MG TABLET    Take 81 mg by mouth every other day.    ATENOLOL (TENORMIN) 50 MG TABLET    Take 50 mg by mouth daily.    CHOLECALCIFEROL (VITAMIN D) 1000 UNITS TABLET    Take 2,000 Units by mouth daily.    COLCHICINE 0.6 MG TABLET    Take 0.6 mg by mouth as needed.   FUROSEMIDE (LASIX) 80 MG TABLET    Take 80 mg by mouth daily.   GABAPENTIN (NEURONTIN) 100 MG CAPSULE    Take 100 mg by mouth 3 (three) times daily.    MAGNESIUM 250 MG TABS    Take 1-2 tablets by mouth daily.   MECLIZINE (ANTIVERT) 12.5 MG TABLET    Take 6.25 mg by mouth 3 (three) times daily as needed. For dizziness    METOLAZONE (ZAROXOLYN) 2.5 MG TABLET    Take 2.5 mg by mouth. Take 30 mins before taking furosemide as needed   MUPIROCIN OINTMENT (BACTROBAN) 2 %    Apply 1 application topically as needed.    NITROGLYCERIN (NITROSTAT) 0.4 MG SL TABLET    Place 0.4 mg under the tongue every 5 (five) minutes as needed. For chest pain   OMEGA-3 FATTY ACIDS (FISH OIL) 1000 MG CAPS    Take 1 capsule by mouth daily.   POTASSIUM CHLORIDE SA (K-DUR,KLOR-CON) 20 MEQ TABLET    Take  20 mEq by mouth 2 (two) times daily.    WARFARIN (COUMADIN) 2.5 MG TABLET    Take 2.5 mg by mouth daily.  Modified Medications   No medications on file  Discontinued Medications   DOXYCYCLINE (VIBRA-TABS) 100 MG TABLET    Take 1 tablet (100 mg total) by mouth 2 (two) times daily.    Subjective: Mr. Dunavan is in for his followup visit. He is accompanied by his son. He completed 30 days of doxycycline treatment on November 16. He had no problem tolerating doxycycline. He is feeling better and happy to have his left leg ulcer healing nicely. His home care nurse continues to rewrap his left lower leg 3 times each week.  Review of Systems: Pertinent items are noted in HPI.  Past Medical History  Diagnosis Date  . Hypertension   . CHF (congestive heart failure)   . Arrhythmia     afib  . Neuropathy   . Cancer     lymphoma  . Heart attack 1986    History  Substance Use Topics  . Smoking status: Former Smoker -- 1.00  packs/day for 15 years    Types: Cigarettes    Quit date: 11/13/1944  . Smokeless tobacco: Never Used  . Alcohol Use: No    Family History  Problem Relation Age of Onset  . Diabetes type II Sister   . Heart disease Brother   . Diabetes type II Daughter     Allergies  Allergen Reactions  . Aleve [Naproxen Sodium]     GI Upset  . Avelox [Moxifloxacin Hcl In Nacl] Other (See Comments)    Messed up lungs  . Codeine Other (See Comments)    unknown  . Digoxin Other (See Comments)    unknown  . Indomethacin Other (See Comments)    unknown  . Levaquin [Levofloxacin]     "affected lungs"  . Lipitor [Atorvastatin] Nausea Only  . Lorazepam Other (See Comments)    unknown  . Norpace [Disopyramide Phosphate] Other (See Comments)    Urinary retention  . Pravachol [Pravastatin Sodium] Nausea Only  . Uloric [Febuxostat]   . Amlodipine Besylate Rash  . Azithromycin Rash    unknown  . Bactrim [Sulfamethoxazole-Tmp Ds] Rash  . Clinoril [Sulindac] Rash  .  Percocet [Oxycodone-Acetaminophen] Rash  . Sulfonamide Derivatives Rash    Objective: Temp: 97.8 F (36.6 C) (11/18 1011) Temp src: Oral (11/18 1011) BP: 157/79 mmHg (11/18 1011) Pulse Rate: 80 (11/18 1011)  General: He is talkative and in very good spirits Skin: Left leg edema has decreased. He still has some swelling over the dorsum of his foot. His chronic left lower leg ulcer or is now coalesced to one smaller ulcer with an irregular border. The ulcer is about 2 inches in diameter. There is a small amount of yellow drainage on the gauze dressing with no odor.   Assessment: His chronic left leg ulcer is heeling well and there are no signs of current infection.  Plan: 1. Observe off of antibiotic therapy 2. Continue wound care 3. Followup here as needed   Cliffton Asters, MD [facility] for Infectious Disease [facility] [phone] pager   [phone] cell 09/26/2013, 10:33 AM

## 2013-09-27 ENCOUNTER — Ambulatory Visit: Payer: Medicare Other | Admitting: Internal Medicine

## 2013-10-10 ENCOUNTER — Encounter (HOSPITAL_BASED_OUTPATIENT_CLINIC_OR_DEPARTMENT_OTHER): Payer: Medicare Other

## 2013-10-18 ENCOUNTER — Telehealth: Payer: Self-pay | Admitting: *Deleted

## 2013-10-18 NOTE — Telephone Encounter (Signed)
Pt has been off doxycycline for 1.5 weeks.  Lower extremity wound that is being treated 3 times a week by [facility] RN now has new punctate areas surrounding it measuring 1 x 1.5 mm.  [facility] RN is concerned about these new areas developing infection.  When the pt is on the oral doxycyline the areas improve and then after he has completed the course within 1-2 weeks he develops new areas.   Pt previously seen by Drs Orvan Falconer and Drue Second. [facility] RN requesting appointment for the pt.  Appt made for 10/19/13 w/ Dr. Daiva Eves.

## 2013-10-19 ENCOUNTER — Ambulatory Visit (INDEPENDENT_AMBULATORY_CARE_PROVIDER_SITE_OTHER): Payer: Medicare Other | Admitting: Infectious Disease

## 2013-10-19 ENCOUNTER — Encounter: Payer: Self-pay | Admitting: Infectious Disease

## 2013-10-19 VITALS — BP 141/69 | HR 71 | Temp 98.2°F | Wt 186.0 lb

## 2013-10-19 DIAGNOSIS — L0291 Cutaneous abscess, unspecified: Secondary | ICD-10-CM

## 2013-10-19 DIAGNOSIS — L97909 Non-pressure chronic ulcer of unspecified part of unspecified lower leg with unspecified severity: Secondary | ICD-10-CM

## 2013-10-19 DIAGNOSIS — L0390 Cellulitis, unspecified: Secondary | ICD-10-CM

## 2013-10-19 DIAGNOSIS — I739 Peripheral vascular disease, unspecified: Secondary | ICD-10-CM

## 2013-10-19 DIAGNOSIS — I83009 Varicose veins of unspecified lower extremity with ulcer of unspecified site: Secondary | ICD-10-CM

## 2013-10-19 DIAGNOSIS — I83029 Varicose veins of left lower extremity with ulcer of unspecified site: Secondary | ICD-10-CM

## 2013-10-19 LAB — COMPLETE METABOLIC PANEL WITH GFR
ALT: 13 U/L (ref 0–53)
AST: 21 U/L (ref 0–37)
Alkaline Phosphatase: 81 U/L (ref 39–117)
GFR, Est Non African American: 60 mL/min
Glucose, Bld: 103 mg/dL — ABNORMAL HIGH (ref 70–99)
Sodium: 138 mEq/L (ref 135–145)
Total Bilirubin: 0.5 mg/dL (ref 0.3–1.2)
Total Protein: 6.9 g/dL (ref 6.0–8.3)

## 2013-10-19 LAB — C-REACTIVE PROTEIN: CRP: 3 mg/dL — ABNORMAL HIGH (ref <0.60)

## 2013-10-19 MED ORDER — DOXYCYCLINE HYCLATE 100 MG PO TABS: 100.0000 mg | ORAL_TABLET | Freq: Two times a day (BID) | ORAL | Status: DC

## 2013-10-19 NOTE — Progress Notes (Signed)
Subjective:    Patient ID: [name], male    DOB: 1923-02-13, 79 y.o.   MRN: [id number]  HPI  Mr. Huser is an 79 year old Caucasian male with history of coronary disease status post coronary artery bypass grafting, varicose veins, peripheral vascular disease with chronic lower extremity ulcerations. He recently finished a course of doxycycline with improvement in his left lower tremor the ulcers. He is taken off antibiotics a few weeks ago by Dr. Orvan Falconer. In the interim his home health nurse was concerned that he was having worsening of his areas of ulceration and he was referred back to [place].  He shows me several pictures which show increase in the size of the ulcers increase in erythema around them. He does not have systemic symptoms such as fevers nausea or malaise.   Review of Systems  Constitutional: Negative for fever, chills, diaphoresis, activity change, appetite change, fatigue and unexpected weight change.  HENT: Negative for congestion, rhinorrhea, sinus pressure, sneezing, sore throat and trouble swallowing.   Eyes: Negative for photophobia and visual disturbance.  Respiratory: Negative for cough, chest tightness, shortness of breath, wheezing and stridor.   Cardiovascular: Negative for chest pain, palpitations and leg swelling.  Gastrointestinal: Negative for nausea, vomiting, abdominal pain, diarrhea, constipation, blood in stool, abdominal distention and anal bleeding.  Genitourinary: Negative for dysuria, hematuria, flank pain and difficulty urinating.  Musculoskeletal: Negative for arthralgias, back pain, gait problem, joint swelling and myalgias.  Skin: Positive for color change and wound. Negative for pallor and rash.  Neurological: Negative for dizziness, tremors, weakness and light-headedness.  Hematological: Negative for adenopathy. Does not bruise/bleed easily.  Psychiatric/Behavioral: Negative for behavioral problems, confusion, sleep disturbance, dysphoric  mood, decreased concentration and agitation.       Objective:   Physical Exam  Constitutional: He is oriented to person, place, and time. He appears well-developed and well-nourished. No distress.  HENT:  Head: Normocephalic and atraumatic.  Mouth/Throat: Oropharynx is clear and moist.  Eyes: Conjunctivae and EOM are normal. No scleral icterus.  Neck: Normal range of motion. Neck supple. No JVD present.  Cardiovascular: Normal rate, regular rhythm and normal heart sounds.   Pulmonary/Chest: Effort normal. No respiratory distress. He has no wheezes.  Abdominal: He exhibits no distension.  Musculoskeletal: He exhibits edema. He exhibits no tenderness.  Lymphadenopathy:    He has no cervical adenopathy.  Neurological: He is alert and oriented to person, place, and time. Coordination normal.  Skin: Skin is warm and dry. He is not diaphoretic. There is erythema. No pallor.     Psychiatric: He has a normal mood and affect. His behavior is normal. Judgment and thought content normal.              Assessment & Plan:   #1 Cellulitis and worsening of chronic ulcers associated with venous stasis and PVD:  --Him back on doxycycline and have been completely some additional month.  Continue current wound care which he says is been better for him than that which she was receiving at the wound care clinic.  Check sedimentation rate C-reactive protein CBC and metabolic panel in case any new imaging such as CT scan.  We'll be especially careful and vigilant with this patient and make sure that we managed his local infections of the does not develop a bacteremia and seeding his pacemaker.  I spent greater than 20 minutes with the patient including greater than 50% of time in face to face counsel of the patient and in coordination  of their care.

## 2013-10-20 ENCOUNTER — Telehealth: Payer: Self-pay | Admitting: *Deleted

## 2013-10-20 LAB — CBC WITH DIFFERENTIAL/PLATELET
Basophils Relative: 1 % (ref 0–1)
HCT: 41.5 % (ref 39.0–52.0)
Hemoglobin: 14.7 g/dL (ref 13.0–17.0)
MCHC: 35.4 g/dL (ref 30.0–36.0)
Monocytes Absolute: 1.3 10*3/uL — ABNORMAL HIGH (ref 0.1–1.0)
Monocytes Relative: 11 % (ref 3–12)
Neutro Abs: 6.4 10*3/uL (ref 1.7–7.7)

## 2013-10-20 LAB — SEDIMENTATION RATE: Sed Rate: 45 mm/hr — ABNORMAL HIGH (ref 0–16)

## 2013-10-20 NOTE — Telephone Encounter (Signed)
Amy nurse with [facility] called asking if Dr. Daiva Eves would sign the wound care orders. Original order is from wound care, but patient has refused to go back there due to the bill. So now wound care will not sign the order and in order to continue going out she needs an MD to sign, please advise. Wendall Mola

## 2013-10-23 NOTE — Telephone Encounter (Signed)
[facility] notified. [name]  

## 2013-10-23 NOTE — Telephone Encounter (Signed)
That is fine 

## 2013-11-01 ENCOUNTER — Ambulatory Visit (INDEPENDENT_AMBULATORY_CARE_PROVIDER_SITE_OTHER): Payer: Medicare Other | Admitting: Infectious Disease

## 2013-11-01 ENCOUNTER — Ambulatory Visit
Admission: RE | Admit: 2013-11-01 | Discharge: 2013-11-01 | Disposition: A | Discharge status: Home / Self Care | Payer: Medicare Other | Source: Ambulatory Visit | Attending: Infectious Disease | Admitting: Infectious Disease

## 2013-11-01 ENCOUNTER — Encounter: Payer: Self-pay | Admitting: Infectious Disease

## 2013-11-01 VITALS — BP 126/84 | HR 59 | Temp 98.1°F | Wt 188.0 lb

## 2013-11-01 DIAGNOSIS — I83029 Varicose veins of left lower extremity with ulcer of unspecified site: Secondary | ICD-10-CM

## 2013-11-01 DIAGNOSIS — I83009 Varicose veins of unspecified lower extremity with ulcer of unspecified site: Secondary | ICD-10-CM

## 2013-11-01 DIAGNOSIS — L02419 Cutaneous abscess of limb, unspecified: Secondary | ICD-10-CM

## 2013-11-01 DIAGNOSIS — L97909 Non-pressure chronic ulcer of unspecified part of unspecified lower leg with unspecified severity: Secondary | ICD-10-CM

## 2013-11-01 DIAGNOSIS — L03116 Cellulitis of left lower limb: Secondary | ICD-10-CM

## 2013-11-01 DIAGNOSIS — I739 Peripheral vascular disease, unspecified: Secondary | ICD-10-CM

## 2013-11-01 DIAGNOSIS — B351 Tinea unguium: Secondary | ICD-10-CM

## 2013-11-01 MED ORDER — CEPHALEXIN 500 MG PO CAPS: 1000.0000 mg | ORAL_CAPSULE | Freq: Two times a day (BID) | ORAL | Status: DC

## 2013-11-01 NOTE — Progress Notes (Signed)
Subjective:    Patient ID: [name], male    DOB: 04/11/1923, 79 y.o.   MRN: [id number]  HPI   [name] is an 79 year old Caucasian male with history of coronary disease status post coronary artery bypass grafting, varicose veins, peripheral vascular disease with chronic lower extremity ulcerations. He recently finished a course of doxycycline with improvement in his left lower tremor the ulcers. He is taken off antibiotics a few weeks ago by Dr. Orvan Falconer. In the interim his home health nurse was concerned that he was having worsening of his areas of ulceration and he was referred back to [place].  He showsed me several pictures which show increase in the size of the ulcers increase in erythema around them at the last visit.   Marland Kitchen He does not have systemic symptoms such as fevers nausea or malaise and on exam there was no obvious purulence. I restarted him on oral doxy and checked labs which were significant for elevated ESR into the 40s.  Since then he has apparently had some improvement in his ulcers but increase in surrounding erythema per pt and son who accompanies him today. He is still without fevers, chills or malaise. The home health RN had suggested idea of underlyling osteomyelitis which I am happy to workup up further as a possbility.   Review of Systems  Constitutional: Negative for fever, chills, diaphoresis, activity change, appetite change, fatigue and unexpected weight change.  HENT: Negative for congestion, rhinorrhea, sinus pressure, sneezing, sore throat and trouble swallowing.   Eyes: Negative for photophobia and visual disturbance.  Respiratory: Negative for cough, chest tightness, shortness of breath, wheezing and stridor.   Cardiovascular: Negative for chest pain, palpitations and leg swelling.  Gastrointestinal: Negative for nausea, vomiting, abdominal pain, diarrhea, constipation, blood in stool, abdominal distention and anal bleeding.  Genitourinary: Negative  for dysuria, hematuria, flank pain and difficulty urinating.  Musculoskeletal: Negative for arthralgias, back pain, gait problem, joint swelling and myalgias.  Skin: Positive for color change and wound. Negative for pallor and rash.  Neurological: Negative for dizziness, tremors, weakness and light-headedness.  Hematological: Negative for adenopathy. Does not bruise/bleed easily.  Psychiatric/Behavioral: Negative for behavioral problems, confusion, sleep disturbance, dysphoric mood, decreased concentration and agitation.       Objective:   Physical Exam  Constitutional: He is oriented to person, place, and time. He appears well-developed and well-nourished. No distress.  HENT:  Head: Normocephalic and atraumatic.  Mouth/Throat: Oropharynx is clear and moist.  Eyes: Conjunctivae and EOM are normal. No scleral icterus.  Neck: Normal range of motion. Neck supple. No JVD present.  Cardiovascular: Normal rate, regular rhythm and normal heart sounds.   Pulmonary/Chest: Effort normal. No respiratory distress. He has no wheezes.  Abdominal: He exhibits no distension.  Musculoskeletal: He exhibits edema. He exhibits no tenderness.  Lymphadenopathy:    He has no cervical adenopathy.  Neurological: He is alert and oriented to person, place, and time. Coordination normal.  Skin: Skin is warm and dry. He is not diaphoretic. There is erythema. No pallor.     Psychiatric: He has a normal mood and affect. His behavior is normal. Judgment and thought content normal.   Bilateral venous stasis changes and changes on the left ankle:    Left ankle with ulcerations and surrounding erythema:     Another view of the left ankle:          Assessment & Plan:   #1 Cellulitis and worsening of chronic ulcers associated with  venous stasis and PVD: Not clear to me that this is truly an infectious process driving everything. I suspect the BIG issue is the underlying very poor circulation  --I  obtained plain films of tib/fib and ankle and neither showed osteomyelitis  --I have offered to add keflex 1 g bid (but he wishes to start AFTEr Xmas  --Him back on doxycycline and have been completely some additional month.  Continue current wound care which he says is been better for him than that which she was receiving at the wound care clinic.   I spent greater than 25 minutes with the patient including greater than 50% of time in face to face counsel of the patient and in coordination of their care.   #2 Onychomycoses: consider lamisil systemically

## 2013-11-06 ENCOUNTER — Telehealth: Payer: Self-pay | Admitting: Licensed Clinical Social Worker

## 2013-11-06 NOTE — Telephone Encounter (Signed)
RN called today after observing new areas on the anterior and medial parts of the patients left leg, RN states that it is draining and has an odor. RN stated that it did not look like this on Friday when she was at the patient's home. RN will be going back out to the home on Wednesday and she will call us to give [place] an update. RN states that the patient does not have fever.  Leg x ray taken on 12/24 did not show osteomyelitis, patient is currently on oral antibiotics. Please advise

## 2013-11-06 NOTE — Telephone Encounter (Signed)
He probably should go back to Dr. Rennis Golden and see if he has any suggestions.  It is from his venous vascular system that is the problem.  [facility].  thanks

## 2013-11-17 ENCOUNTER — Telehealth: Payer: Self-pay

## 2013-11-17 NOTE — Telephone Encounter (Signed)
Nurse is calling regarding wound and dose of antibiotics.  She thinks he wound looks different every day.  I has a "weird appearence" something she has never seen before and describes it as "slummy and wet "in posterior ankle. Area/ The set area appears under the crusted portion.  Nurse wants to know if he should continue doxycycline since it had refills noted.   As for wound shew will take pictures. Amy, RN was advised to call our office at next dressing change if wound continues to be concerning we will see patient prior to his scheduled visit this month. As for the antibiotic should continue until next office visit for sure.   Laverle Patter, RN

## 2013-12-04 ENCOUNTER — Encounter: Payer: Self-pay | Admitting: Infectious Disease

## 2013-12-04 ENCOUNTER — Ambulatory Visit (INDEPENDENT_AMBULATORY_CARE_PROVIDER_SITE_OTHER): Payer: Medicare Other | Admitting: Infectious Disease

## 2013-12-04 VITALS — BP 147/82 | HR 76 | Temp 97.7°F | Wt 186.0 lb

## 2013-12-04 DIAGNOSIS — B351 Tinea unguium: Secondary | ICD-10-CM | POA: Insufficient documentation

## 2013-12-04 MED ORDER — TERBINAFINE HCL 250 MG PO TABS: 250.0000 mg | ORAL_TABLET | Freq: Every day | ORAL | Status: DC

## 2013-12-04 NOTE — Progress Notes (Signed)
Subjective:    Patient ID: [name], male    DOB: [birth year], 80 y.o.   MRN: [id number]  HPI   [name] is an 80 year old Caucasian male with history of coronary disease status post coronary artery bypass grafting, varicose veins, peripheral vascular disease with chronic lower extremity ulcerations. He recently finished a course of doxycycline with improvement in his left lower tremor the ulcers. He is taken off antibiotics a few weeks ago by Dr. Megan Salon. In the interim his home health nurse was concerned that he was having worsening of his areas of ulceration and he was referred back to [place].  He at that time showed  me several pictures which show increase in the size of the ulcers increase in erythema around them at the last visit.   . I restarted him on oral doxy and checked labs which were significant for elevated ESR into the 40s.  Since then he has apparently had some improvement in his ulcers but increase in surrounding erythema per pt and son who accompanies him today. He is still without fevers, chills or malaise.   I then added keflex 1g bid and he has continued to improvement in the appearance of his left leg ulcerations   Review of Systems  Constitutional: Negative for fever, chills, diaphoresis, activity change, appetite change, fatigue and unexpected weight change.  HENT: Negative for congestion, rhinorrhea, sinus pressure, sneezing, sore throat and trouble swallowing.   Eyes: Negative for photophobia and visual disturbance.  Respiratory: Negative for cough, chest tightness, shortness of breath, wheezing and stridor.   Cardiovascular: Negative for chest pain, palpitations and leg swelling.  Gastrointestinal: Negative for nausea, vomiting, abdominal pain, diarrhea, constipation, blood in stool, abdominal distention and anal bleeding.  Genitourinary: Negative for dysuria, hematuria, flank pain and difficulty urinating.  Musculoskeletal: Negative for arthralgias, back  pain, gait problem, joint swelling and myalgias.  Skin: Positive for color change and wound. Negative for pallor and rash.  Neurological: Negative for dizziness, tremors, weakness and light-headedness.  Hematological: Negative for adenopathy. Does not bruise/bleed easily.  Psychiatric/Behavioral: Negative for behavioral problems, confusion, sleep disturbance, dysphoric mood, decreased concentration and agitation.       Objective:   Physical Exam  Constitutional: He is oriented to person, place, and time. He appears well-developed and well-nourished. No distress.  HENT:  Head: Normocephalic and atraumatic.  Mouth/Throat: Oropharynx is clear and moist.  Eyes: Conjunctivae and EOM are normal. No scleral icterus.  Neck: Normal range of motion. Neck supple. No JVD present.  Cardiovascular: Normal rate, regular rhythm and normal heart sounds.   Pulmonary/Chest: Effort normal. No respiratory distress. He has no wheezes.  Abdominal: He exhibits no distension.  Musculoskeletal: He exhibits edema. He exhibits no tenderness.  Lymphadenopathy:    He has no cervical adenopathy.  Neurological: He is alert and oriented to person, place, and time. Coordination normal.  Skin: Skin is warm and dry. He is not diaphoretic. There is erythema. No pallor.     Psychiatric: He has a normal mood and affect. His behavior is normal. Judgment and thought content normal.   Bilateral venous stasis changes and changes on the left ankle:      Left ankle : I took several pics but they did not upload to [place], [employer] for some reason, erythema is improved           Assessment & Plan:   #1 Cellulitis and worsening of chronic ulcers associated with venous stasis and PVD: Not clear to me  that this is truly an infectious process driving everything.   --STOP antibacterial abx  Continue current wound care which he says is been better for him than that which she was receiving at the wound care clinic.   I  spent greater than 25 minutes with the patient including greater than 50% of time in face to face counsel of the patient and in coordination of their care.   #2 Onychomycoses: start  lamisil systemically and can continue topical antifungals as well

## 2013-12-06 ENCOUNTER — Encounter: Payer: Self-pay | Admitting: Internal Medicine

## 2013-12-06 ENCOUNTER — Ambulatory Visit: Payer: Medicare Other | Admitting: Infectious Disease

## 2013-12-06 ENCOUNTER — Ambulatory Visit: Payer: Medicare Other | Admitting: *Deleted

## 2013-12-07 LAB — MDC_IDC_ENUM_SESS_TYPE_REMOTE
Battery Remaining Longevity: 90 mo
Battery Voltage: 2.93 V
Brady Statistic AP VP Percent: 1 %
Brady Statistic AP VS Percent: 1 %
Brady Statistic AS VP Percent: 51 %
Brady Statistic AS VS Percent: 49 %
Brady Statistic RA Percent Paced: 1 %
Brady Statistic RV Percent Paced: 51 %
Date Time Interrogation Session: 20150128070923
Implantable Pulse Generator Model: 2210
Implantable Pulse Generator Serial Number: 7178130
Lead Channel Impedance Value: 380 Ohm
Lead Channel Impedance Value: 830 Ohm
Lead Channel Pacing Threshold Amplitude: 0.5 V
Lead Channel Pacing Threshold Pulse Width: 0.4 ms
Lead Channel Sensing Intrinsic Amplitude: 1 mV
Lead Channel Sensing Intrinsic Amplitude: 12 mV
Lead Channel Setting Pacing Amplitude: 0.75 V
Lead Channel Setting Pacing Amplitude: 2 V
Lead Channel Setting Pacing Pulse Width: 0.4 ms
Lead Channel Setting Sensing Sensitivity: 2 mV

## 2014-01-02 ENCOUNTER — Encounter: Payer: Self-pay | Admitting: *Deleted

## 2014-01-10 ENCOUNTER — Encounter: Payer: Self-pay | Admitting: Infectious Disease

## 2014-01-10 ENCOUNTER — Ambulatory Visit (INDEPENDENT_AMBULATORY_CARE_PROVIDER_SITE_OTHER): Payer: Medicare Other | Admitting: Infectious Disease

## 2014-01-10 VITALS — BP 150/74 | HR 64 | Temp 98.3°F | Wt 188.0 lb

## 2014-01-10 DIAGNOSIS — B351 Tinea unguium: Secondary | ICD-10-CM

## 2014-01-10 DIAGNOSIS — L0390 Cellulitis, unspecified: Secondary | ICD-10-CM

## 2014-01-10 DIAGNOSIS — L0291 Cutaneous abscess, unspecified: Secondary | ICD-10-CM

## 2014-01-10 DIAGNOSIS — I8310 Varicose veins of unspecified lower extremity with inflammation: Secondary | ICD-10-CM

## 2014-01-10 DIAGNOSIS — I872 Venous insufficiency (chronic) (peripheral): Secondary | ICD-10-CM

## 2014-01-10 NOTE — Progress Notes (Signed)
Subjective:    Patient ID: [name], male    DOB: [birth year], 80 y.o.   MRN: [id number]  HPI   Mr. Vincent is an 80 year old Caucasian male with history of coronary disease status post coronary artery bypass grafting, varicose veins, peripheral vascular disease with chronic lower extremity ulcerations. He recently finished a course of doxycycline with improvement in his left lower tremor the ulcers. He is taken off antibiotics a few weeks ago by Dr. Megan Salon. In the interim his home health nurse was concerned that he was having worsening of his areas of ulceration and he was referred back to [place].  He at that time showed  me several pictures which show increase in the size of the ulcers increase in erythema around them at the last visit.   . I restarted him on oral doxy and checked labs which were significant for elevated ESR into the 40s.  Since then he has apparently had some improvement in his ulcers but increase in surrounding erythema per pt and son who accompanies him today. He is still without fevers, chills or malaise.   I then added keflex 1g bid and he has continued to improvement in the appearance of his left leg ulcerations  At last visit we stopped this and he has remained off of this since I last last saw him a month ago. His wounds have been stable since then he still has some oozing at various spots but feels that the size improving with close management by his home health care nurse. He is also started on systemic Lamisil for his onychomycosis.   Review of Systems  Constitutional: Negative for fever, chills, diaphoresis, activity change, appetite change, fatigue and unexpected weight change.  HENT: Negative for congestion, rhinorrhea, sinus pressure, sneezing, sore throat and trouble swallowing.   Eyes: Negative for photophobia and visual disturbance.  Respiratory: Negative for cough, chest tightness, shortness of breath, wheezing and stridor.   Cardiovascular:  Negative for chest pain, palpitations and leg swelling.  Gastrointestinal: Negative for nausea, vomiting, abdominal pain, diarrhea, constipation, blood in stool, abdominal distention and anal bleeding.  Genitourinary: Negative for dysuria, hematuria, flank pain and difficulty urinating.  Musculoskeletal: Negative for arthralgias, back pain, gait problem, joint swelling and myalgias.  Skin: Positive for color change and wound. Negative for pallor and rash.  Neurological: Negative for dizziness, tremors, weakness and light-headedness.  Hematological: Negative for adenopathy. Does not bruise/bleed easily.  Psychiatric/Behavioral: Negative for behavioral problems, confusion, sleep disturbance, dysphoric mood, decreased concentration and agitation.       Objective:   Physical Exam  Constitutional: He is oriented to person, place, and time. He appears well-developed and well-nourished. No distress.  HENT:  Head: Normocephalic and atraumatic.  Mouth/Throat: Oropharynx is clear and moist.  Eyes: Conjunctivae and EOM are normal. No scleral icterus.  Neck: Normal range of motion. Neck supple. No JVD present.  Cardiovascular: Normal rate, regular rhythm and normal heart sounds.   Pulmonary/Chest: Effort normal. No respiratory distress. He has no wheezes.  Abdominal: He exhibits no distension.  Musculoskeletal: He exhibits edema. He exhibits no tenderness.  Lymphadenopathy:    He has no cervical adenopathy.  Neurological: He is alert and oriented to person, place, and time. Coordination normal.  Skin: Skin is warm and dry. He is not diaphoretic. There is erythema. No pallor.     Psychiatric: He has a normal mood and affect. His behavior is normal. Judgment and thought content normal.   Bilateral venous stasis changes with  more hyperpigmentation changes on the left ankle, onychomycosis:                 Assessment & Plan:   #1 Cellulitis and  chronic ulcers associated with venous  stasis and PVD: Not clear to me that this is truly an infectious process driving everything.   --Continue to observe off antibiotics and Continue current wound care    #2 Onychomycoses: Continue  lamisil systemically and can continue topical antifungals as well

## 2014-01-22 ENCOUNTER — Encounter: Payer: Self-pay | Admitting: Infectious Disease

## 2014-01-22 ENCOUNTER — Ambulatory Visit (INDEPENDENT_AMBULATORY_CARE_PROVIDER_SITE_OTHER): Payer: Medicare Other | Admitting: Infectious Disease

## 2014-01-22 VITALS — BP 157/85 | Temp 97.9°F | Wt 186.0 lb

## 2014-01-22 DIAGNOSIS — I872 Venous insufficiency (chronic) (peripheral): Secondary | ICD-10-CM

## 2014-01-22 DIAGNOSIS — L0390 Cellulitis, unspecified: Secondary | ICD-10-CM

## 2014-01-22 DIAGNOSIS — B351 Tinea unguium: Secondary | ICD-10-CM

## 2014-01-22 DIAGNOSIS — I8310 Varicose veins of unspecified lower extremity with inflammation: Secondary | ICD-10-CM

## 2014-01-22 DIAGNOSIS — L0291 Cutaneous abscess, unspecified: Secondary | ICD-10-CM

## 2014-01-22 MED ORDER — CEPHALEXIN 500 MG PO CAPS: 1000.0000 mg | ORAL_CAPSULE | Freq: Two times a day (BID) | ORAL | Status: DC

## 2014-01-22 NOTE — Progress Notes (Signed)
Subjective:    Patient ID: [name], male    DOB: September 02, 1923, 80 y.o.   MRN: [id number]  HPI   [name] is an 80 year old Caucasian male with history of coronary disease status post coronary artery bypass grafting, varicose veins, peripheral vascular disease with chronic lower extremity ulcerations. He recently finished a course of doxycycline with improvement in his left lower tremor the ulcers. He is taken off antibiotics a few weeks ago by Dr. Megan Salon. In the interim his home health nurse was concerned that he was having worsening of his areas of ulceration and he was referred back to [place].  He at that time showed  me several pictures which show increase in the size of the ulcers increase in erythema around them at the last visit.   . I restarted him on oral doxy and checked labs which were significant for elevated ESR into the 40s.  Since then he has apparently had some improvement in his ulcers but increase in surrounding erythema per pt and son who accompanies him today. He is still without fevers, chills or malaise.   I then added keflex 1g bid and he has continued to improvement in the appearance of his left leg ulcerations  At last visit we stopped this and he has remained off of this since. I saw him again roughly 2 weeks ago and he appeared to b holding steady but now int he past week or so he has increased drainage from his legs medially and laterally and his home health RN became concerned.  He has no fevers, chills or systemic symtoms. She is agreeable to going to [facility] wound care as well.  Review of Systems  Constitutional: Negative for fever, chills, diaphoresis, activity change, appetite change, fatigue and unexpected weight change.  HENT: Negative for congestion, rhinorrhea, sinus pressure, sneezing, sore throat and trouble swallowing.   Eyes: Negative for photophobia and visual disturbance.  Respiratory: Negative for cough, chest tightness, shortness of breath,  wheezing and stridor.   Cardiovascular: Negative for chest pain, palpitations and leg swelling.  Gastrointestinal: Negative for nausea, vomiting, abdominal pain, diarrhea, constipation, blood in stool, abdominal distention and anal bleeding.  Genitourinary: Negative for dysuria, hematuria, flank pain and difficulty urinating.  Musculoskeletal: Negative for arthralgias, back pain, gait problem, joint swelling and myalgias.  Skin: Positive for color change and wound. Negative for pallor and rash.  Neurological: Negative for dizziness, tremors, weakness and light-headedness.  Hematological: Negative for adenopathy. Does not bruise/bleed easily.  Psychiatric/Behavioral: Negative for behavioral problems, confusion, sleep disturbance, dysphoric mood, decreased concentration and agitation.       Objective:   Physical Exam  Constitutional: He is oriented to person, place, and time. He appears well-developed and well-nourished. No distress.  HENT:  Head: Normocephalic and atraumatic.  Mouth/Throat: Oropharynx is clear and moist.  Eyes: Conjunctivae and EOM are normal. No scleral icterus.  Neck: Normal range of motion. Neck supple. No JVD present.  Cardiovascular: Normal rate, regular rhythm and normal heart sounds.   Pulmonary/Chest: Effort normal. No respiratory distress. He has no wheezes.  Abdominal: He exhibits no distension.  Musculoskeletal: He exhibits edema. He exhibits no tenderness.  Lymphadenopathy:    He has no cervical adenopathy.  Neurological: He is alert and oriented to person, place, and time. Coordination normal.  Skin: Skin is warm and dry. He is not diaphoretic. There is erythema. No pallor.     Psychiatric: He has a normal mood and affect. His behavior is normal. Judgment  and thought content normal.   Bilateral venous stasis changes with more hyperpigmentation changes on the left ankle, onychomycosis:  01/20/14 visit see below:     01/22/14:  Left leg today with  more clear exudate: lateral side    Medial aspect                  Assessment & Plan:   #1 Cellulitis and  chronic ulcers associated with venous stasis and PVD: Not clear to me that this is truly an infectious process driving everything. He has more exudate now:   --I will add back keflex and we will bring him back in another 2 months Continued wound care  #2 Onychomycoses: Continue  lamisil systemically and can continue topical antifungals as well

## 2014-02-05 DIAGNOSIS — I87309 Chronic venous hypertension (idiopathic) without complications of unspecified lower extremity: Secondary | ICD-10-CM | POA: Insufficient documentation

## 2014-02-05 DIAGNOSIS — I83229 Varicose veins of left lower extremity with both ulcer of unspecified site and inflammation: Secondary | ICD-10-CM | POA: Insufficient documentation

## 2014-02-05 DIAGNOSIS — I872 Venous insufficiency (chronic) (peripheral): Secondary | ICD-10-CM | POA: Insufficient documentation

## 2014-02-05 DIAGNOSIS — L97929 Non-pressure chronic ulcer of unspecified part of left lower leg with unspecified severity: Secondary | ICD-10-CM

## 2014-03-12 ENCOUNTER — Ambulatory Visit (INDEPENDENT_AMBULATORY_CARE_PROVIDER_SITE_OTHER): Payer: Medicare Other | Admitting: *Deleted

## 2014-03-12 ENCOUNTER — Encounter: Payer: Self-pay | Admitting: Internal Medicine

## 2014-03-12 DIAGNOSIS — I495 Sick sinus syndrome: Secondary | ICD-10-CM

## 2014-03-12 DIAGNOSIS — I509 Heart failure, unspecified: Secondary | ICD-10-CM

## 2014-03-12 DIAGNOSIS — I5031 Acute diastolic (congestive) heart failure: Secondary | ICD-10-CM

## 2014-03-12 LAB — MDC_IDC_ENUM_SESS_TYPE_REMOTE
Battery Remaining Longevity: 89 mo
Brady Statistic AP VP Percent: 1 %
Brady Statistic AP VS Percent: 1 %
Brady Statistic AS VS Percent: 46 %
Lead Channel Impedance Value: 930 Ohm
Lead Channel Pacing Threshold Amplitude: 0.5 V
Lead Channel Pacing Threshold Pulse Width: 0.4 ms
Lead Channel Sensing Intrinsic Amplitude: 12 mV
Lead Channel Setting Pacing Amplitude: 0.75 V
Lead Channel Setting Pacing Amplitude: 2 V
MDC IDC MSMT BATTERY VOLTAGE: 2.93 V
MDC IDC MSMT LEADCHNL RA IMPEDANCE VALUE: 390 Ohm
MDC IDC MSMT LEADCHNL RA PACING THRESHOLD AMPLITUDE: 0.75 V
MDC IDC MSMT LEADCHNL RA SENSING INTR AMPL: 1 mV
MDC IDC MSMT LEADCHNL RV PACING THRESHOLD PULSEWIDTH: 0.4 ms
MDC IDC PG SERIAL: 7178130
MDC IDC SESS DTM: 20150504074132
MDC IDC SET LEADCHNL RV PACING PULSEWIDTH: 0.4 ms
MDC IDC SET LEADCHNL RV SENSING SENSITIVITY: 2 mV
MDC IDC STAT BRADY AS VP PERCENT: 54 %
MDC IDC STAT BRADY RA PERCENT PACED: 1 %
MDC IDC STAT BRADY RV PERCENT PACED: 54 %

## 2014-03-14 ENCOUNTER — Ambulatory Visit: Payer: Medicare Other | Admitting: Infectious Disease

## 2014-03-22 ENCOUNTER — Encounter: Payer: Self-pay | Admitting: Cardiology

## 2014-06-13 ENCOUNTER — Ambulatory Visit (INDEPENDENT_AMBULATORY_CARE_PROVIDER_SITE_OTHER): Payer: Medicare Other | Admitting: *Deleted

## 2014-06-13 DIAGNOSIS — I509 Heart failure, unspecified: Secondary | ICD-10-CM

## 2014-06-13 DIAGNOSIS — I5032 Chronic diastolic (congestive) heart failure: Secondary | ICD-10-CM

## 2014-06-13 DIAGNOSIS — I495 Sick sinus syndrome: Secondary | ICD-10-CM

## 2014-06-13 DIAGNOSIS — I5031 Acute diastolic (congestive) heart failure: Secondary | ICD-10-CM

## 2014-06-13 NOTE — Progress Notes (Signed)
Remote pacemaker transmission.   

## 2014-06-18 LAB — MDC_IDC_ENUM_SESS_TYPE_REMOTE
Battery Remaining Longevity: 82 mo
Brady Statistic AP VS Percent: 1 %
Brady Statistic AS VP Percent: 60 %
Brady Statistic AS VS Percent: 40 %
Brady Statistic RA Percent Paced: 1 %
Lead Channel Impedance Value: 860 Ohm
Lead Channel Pacing Threshold Amplitude: 0.375 V
Lead Channel Pacing Threshold Amplitude: 0.75 V
Lead Channel Pacing Threshold Pulse Width: 0.4 ms
Lead Channel Sensing Intrinsic Amplitude: 12 mV
Lead Channel Setting Pacing Amplitude: 0.625
Lead Channel Setting Pacing Pulse Width: 0.4 ms
MDC IDC MSMT BATTERY REMAINING PERCENTAGE: 59 %
MDC IDC MSMT BATTERY VOLTAGE: 2.92 V
MDC IDC MSMT LEADCHNL RA IMPEDANCE VALUE: 380 Ohm
MDC IDC MSMT LEADCHNL RA PACING THRESHOLD PULSEWIDTH: 0.4 ms
MDC IDC MSMT LEADCHNL RA SENSING INTR AMPL: 1 mV
MDC IDC PG SERIAL: 7178130
MDC IDC SESS DTM: 20150805061050
MDC IDC SET LEADCHNL RA PACING AMPLITUDE: 2 V
MDC IDC SET LEADCHNL RV SENSING SENSITIVITY: 2 mV
MDC IDC STAT BRADY AP VP PERCENT: 1 %
MDC IDC STAT BRADY RV PERCENT PACED: 60 %

## 2014-06-27 ENCOUNTER — Encounter: Payer: Self-pay | Admitting: Cardiology

## 2014-07-12 ENCOUNTER — Encounter: Payer: Self-pay | Admitting: Internal Medicine

## 2014-10-09 ENCOUNTER — Encounter: Payer: Self-pay | Admitting: Internal Medicine

## 2014-10-09 ENCOUNTER — Ambulatory Visit (INDEPENDENT_AMBULATORY_CARE_PROVIDER_SITE_OTHER): Payer: Medicare Other | Admitting: Internal Medicine

## 2014-10-09 VITALS — BP 132/84 | HR 67 | Ht 64.0 in | Wt 192.4 lb

## 2014-10-09 DIAGNOSIS — I482 Chronic atrial fibrillation, unspecified: Secondary | ICD-10-CM

## 2014-10-09 DIAGNOSIS — I5032 Chronic diastolic (congestive) heart failure: Secondary | ICD-10-CM

## 2014-10-09 DIAGNOSIS — Z950 Presence of cardiac pacemaker: Secondary | ICD-10-CM

## 2014-10-09 LAB — MDC_IDC_ENUM_SESS_TYPE_INCLINIC
Battery Remaining Longevity: 120 mo
Battery Voltage: 2.93 V
Brady Statistic RA Percent Paced: 0.08 %
Date Time Interrogation Session: 20151201111402
Lead Channel Impedance Value: 375 Ohm
Lead Channel Pacing Threshold Amplitude: 0.375 V
Lead Channel Pacing Threshold Amplitude: 0.75 V
Lead Channel Pacing Threshold Pulse Width: 0.4 ms
Lead Channel Setting Pacing Amplitude: 0.625
Lead Channel Setting Pacing Pulse Width: 0.4 ms
MDC IDC MSMT LEADCHNL RA SENSING INTR AMPL: 1.1 mV
MDC IDC MSMT LEADCHNL RV IMPEDANCE VALUE: 887.5 Ohm
MDC IDC MSMT LEADCHNL RV PACING THRESHOLD PULSEWIDTH: 0.4 ms
MDC IDC MSMT LEADCHNL RV SENSING INTR AMPL: 12 mV
MDC IDC PG SERIAL: 7178130
MDC IDC SET LEADCHNL RA PACING AMPLITUDE: 2 V
MDC IDC SET LEADCHNL RV SENSING SENSITIVITY: 2 mV
MDC IDC STAT BRADY RV PERCENT PACED: 63 %

## 2014-10-09 NOTE — Assessment & Plan Note (Signed)
His chronic diastolic heart failure remains class II. He will continue his current medical therapy, and I've encouraged the patient to maintain a low-sodium diet. In addition, he will keep his legs elevated as much as possible to help improve chronic peripheral edema.

## 2014-10-09 NOTE — Progress Notes (Signed)
HPI [name] returns today for followup. He is a very pleasant 80 yo man with a history of symptomatic tachybradycardia syndrome , status post permanent pacemaker insertion. He now has chronic atrial fibrillation. Despite this, he is stable. He denies palpitations, chest pain, or syncope. His medications have been adjusted. His heart failure symptoms are class II. His main problem is chronic venous insufficiency and the propensity for cellulitis. He denies fever or chills. No syncope. Allergies  Allergen Reactions  . Aleve [Naproxen Sodium]     GI Upset  . Avelox [Moxifloxacin Hcl In Nacl] Other (See Comments)    Messed up lungs  . Codeine Other (See Comments)    unknown  . Digoxin Other (See Comments)    unknown  . Indomethacin Other (See Comments)    unknown  . Levaquin [Levofloxacin]     "affected lungs"  . Lipitor [Atorvastatin] Nausea Only  . Lorazepam Other (See Comments)    unknown  . Norpace [Disopyramide Phosphate] Other (See Comments)    Urinary retention  . Pravachol [Pravastatin Sodium] Nausea Only  . Uloric [Febuxostat]     unknown  . Amlodipine Besylate Rash  . Azithromycin Rash    unknown  . Bactrim [Sulfamethoxazole-Trimethoprim] Rash  . Clinoril [Sulindac] Rash  . Percocet [Oxycodone-Acetaminophen] Rash  . Sulfonamide Derivatives Rash     Current Outpatient Prescriptions  Medication Sig Dispense Refill  . acetaminophen (TYLENOL) 500 MG tablet Take 1,000 mg by mouth at bedtime.     Marland Kitchen allopurinol (ZYLOPRIM) 300 MG tablet Take 300 mg by mouth daily.     Marland Kitchen ALPRAZolam (XANAX) 0.5 MG tablet Take 0.5 mg by mouth at bedtime as needed. For sleep    . aspirin 81 MG tablet Take 81 mg by mouth every other day.     Marland Kitchen atenolol (TENORMIN) 50 MG tablet Take 50 mg by mouth daily.     . cephALEXin (KEFLEX) 500 MG capsule Take 2 capsules (1,000 mg total) by mouth 2 (two) times daily. 120 capsule 1  . cholecalciferol (VITAMIN D) 1000 UNITS tablet Take 2,000 Units by mouth  daily.     . colchicine 0.6 MG tablet Take 0.6 mg by mouth daily as needed (gout).     Marland Kitchen doxycycline (VIBRA-TABS) 100 MG tablet Take 1 tablet (100 mg total) by mouth 2 (two) times daily. 60 tablet 2  . furosemide (LASIX) 80 MG tablet Take 80 mg by mouth daily.    Marland Kitchen gabapentin (NEURONTIN) 100 MG capsule Take 100 mg by mouth 3 (three) times daily.     Marland Kitchen ketoconazole (NIZORAL) 2 % cream     . Magnesium 250 MG TABS Take 1-2 tablets by mouth daily.    . meclizine (ANTIVERT) 12.5 MG tablet Take 6.25 mg by mouth 3 (three) times daily as needed. For dizziness     . metolazone (ZAROXOLYN) 2.5 MG tablet Take 2.5 mg by mouth. Take 30 mins before taking furosemide as needed    . mupirocin ointment (BACTROBAN) 2 % Apply 1 application topically daily as needed (infected area).     . nitroGLYCERIN (NITROSTAT) 0.4 MG SL tablet Place 0.4 mg under the tongue every 5 (five) minutes as needed. For chest pain    . Omega-3 Fatty Acids (FISH OIL) 1000 MG CAPS Take 1 capsule by mouth daily.    . potassium chloride SA (K-DUR,KLOR-CON) 20 MEQ tablet Take 20 mEq by mouth 2 (two) times daily.     Marland Kitchen terbinafine (LAMISIL) 250 MG tablet Take 1 tablet (  250 mg total) by mouth daily. 30 tablet 5  . warfarin (COUMADIN) 2.5 MG tablet Take 2.5 mg by mouth daily.     No current facility-administered medications for this visit.     Past Medical History  Diagnosis Date  . Hypertension   . CHF (congestive heart failure)   . Arrhythmia     afib  . Neuropathy   . Cancer     lymphoma  . Heart attack 1986  . ED (erectile dysfunction)   . Coronary artery disease   . Non Hodgkin's lymphoma   . PE (pulmonary embolism)   . GERD (gastroesophageal reflux disease)   . Hyperlipidemia   . Gout   . Peptic ulcer disease   . DJD (degenerative joint disease)   . PAC (premature atrial contraction)   . CVI (common variable immunodeficiency)   . Venous stasis ulcers   . IFG (impaired fasting glucose)     ROS:   All systems  reviewed and negative except as noted in the HPI.   Past Surgical History  Procedure Laterality Date  . Pacemaker insertion  08/2010  . Cardiac surgery    . Eye surgery  10/2006, 11/2006  . Back and knee surgeries  unknown  . Tonsillectomy  1972  . Nasal sinus surgery  1980  . Coronary artery bypass graft  1990  . Elbow surgery      r/t gout     Family History  Problem Relation Age of Onset  . Diabetes type II Sister   . Heart disease Brother   . Diabetes type II Daughter      History   Social History  . Marital Status: Widowed    Spouse Name: N/A    Number of Children: N/A  . Years of Education: N/A   Occupational History  . Not on file.   Social History Main Topics  . Smoking status: Former Smoker -- 1.00 packs/day for 15 years    Types: Cigarettes    Quit date: 11/13/1944  . Smokeless tobacco: Never Used  . Alcohol Use: No  . Drug Use: No  . Sexual Activity: Yes    Birth Control/ Protection: Diaphragm   Other Topics Concern  . Not on file   Social History Narrative     BP 132/84 mmHg  Pulse 67  Ht 5\' 4"  (1.626 m)  Wt 192 lb 6.4 oz (87.272 kg)  BMI 33.01 kg/m2  Physical Exam:  Well appearing elderly man, NAD HEENT: Unremarkable Neck:  No JVD, no thyromegally Lungs:  Clear with no wheezes, rales, or rhonchi. HEART:  Regular rate rhythm, no murmurs, no rubs, no clicks Abd:  soft, positive bowel sounds, no organomegally, no rebound, no guarding Ext:  2 plus pulses, 2+ edema, no cyanosis, no clubbing Skin:  No rashes no nodules Neuro:  CN II through XII intact, motor grossly intact   DEVICE  Normal device function.  See PaceArt for details.   Assess/Plan:

## 2014-10-09 NOTE — Assessment & Plan Note (Signed)
He is out of rhythm now essentially 100% of the time. His ventricular rate is well controlled. He will continue his anticoagulation with warfarin.

## 2014-10-09 NOTE — Assessment & Plan Note (Signed)
His St. Jude dual-chamber pacemaker is working normally. We'll plan to recheck in several months. 

## 2014-10-09 NOTE — Patient Instructions (Signed)
Your physician wants you to follow-up in: 12 months with Dr. Knox Saliva will receive a reminder letter in the mail two months in advance. If you don't receive a letter, please call our office to schedule the follow-up appointment.  Remote monitoring is used to monitor your Pacemaker of ICD from home. This monitoring reduces the number of office visits required to check your device to one time per year. It allows [place] to keep an eye on the functioning of your device to ensure it is working properly. You are scheduled for a device check from home on 01/10/15. You may send your transmission at any time that day. If you have a wireless device, the transmission will be sent automatically. After your physician reviews your transmission, you will receive a postcard with your next transmission date.

## 2015-01-10 ENCOUNTER — Ambulatory Visit (INDEPENDENT_AMBULATORY_CARE_PROVIDER_SITE_OTHER): Payer: Medicare Other | Admitting: *Deleted

## 2015-01-10 DIAGNOSIS — I495 Sick sinus syndrome: Secondary | ICD-10-CM

## 2015-01-11 NOTE — Progress Notes (Signed)
Remote pacemaker transmission.   

## 2015-01-15 LAB — MDC_IDC_ENUM_SESS_TYPE_REMOTE
Battery Remaining Percentage: 67 %
Brady Statistic AP VP Percent: 1 %
Brady Statistic RA Percent Paced: 1 %
Brady Statistic RV Percent Paced: 74 %
Date Time Interrogation Session: 20160303072450
Implantable Pulse Generator Model: 2210
Implantable Pulse Generator Serial Number: 7178130
Lead Channel Impedance Value: 360 Ohm
Lead Channel Pacing Threshold Amplitude: 0.5 V
Lead Channel Setting Pacing Pulse Width: 0.4 ms
MDC IDC MSMT BATTERY REMAINING LONGEVITY: 93 mo
MDC IDC MSMT BATTERY VOLTAGE: 2.93 V
MDC IDC MSMT LEADCHNL RV IMPEDANCE VALUE: 750 Ohm
MDC IDC MSMT LEADCHNL RV PACING THRESHOLD PULSEWIDTH: 0.4 ms
MDC IDC MSMT LEADCHNL RV SENSING INTR AMPL: 12 mV
MDC IDC SET LEADCHNL RA PACING AMPLITUDE: 2 V
MDC IDC SET LEADCHNL RV PACING AMPLITUDE: 0.75 V
MDC IDC SET LEADCHNL RV SENSING SENSITIVITY: 2 mV
MDC IDC STAT BRADY AP VS PERCENT: 1 %
MDC IDC STAT BRADY AS VP PERCENT: 74 %
MDC IDC STAT BRADY AS VS PERCENT: 26 %

## 2015-01-24 ENCOUNTER — Encounter: Payer: Self-pay | Admitting: Cardiology

## 2015-01-30 ENCOUNTER — Encounter: Payer: Self-pay | Admitting: Internal Medicine

## 2015-02-06 ENCOUNTER — Other Ambulatory Visit: Payer: Self-pay | Admitting: Dermatology

## 2015-04-15 ENCOUNTER — Ambulatory Visit (INDEPENDENT_AMBULATORY_CARE_PROVIDER_SITE_OTHER): Payer: Medicare Other | Admitting: *Deleted

## 2015-04-15 DIAGNOSIS — I495 Sick sinus syndrome: Secondary | ICD-10-CM

## 2015-04-16 NOTE — Progress Notes (Signed)
Remote pacemaker transmission.   

## 2015-04-20 LAB — CUP PACEART REMOTE DEVICE CHECK
Battery Remaining Percentage: 59 %
Battery Voltage: 2.92 V
Brady Statistic AP VP Percent: 1 %
Brady Statistic AP VS Percent: 1 %
Brady Statistic AS VP Percent: 78 %
Lead Channel Impedance Value: 400 Ohm
Lead Channel Impedance Value: 810 Ohm
Lead Channel Pacing Threshold Amplitude: 0.5 V
Lead Channel Sensing Intrinsic Amplitude: 12 mV
Lead Channel Setting Pacing Amplitude: 2 V
Lead Channel Setting Sensing Sensitivity: 2 mV
MDC IDC MSMT BATTERY REMAINING LONGEVITY: 83 mo
MDC IDC MSMT LEADCHNL RA PACING THRESHOLD AMPLITUDE: 0.75 V
MDC IDC MSMT LEADCHNL RA PACING THRESHOLD PULSEWIDTH: 0.4 ms
MDC IDC MSMT LEADCHNL RA SENSING INTR AMPL: 1.1 mV
MDC IDC MSMT LEADCHNL RV PACING THRESHOLD PULSEWIDTH: 0.4 ms
MDC IDC PG SERIAL: 7178130
MDC IDC SESS DTM: 20160606075538
MDC IDC SET LEADCHNL RV PACING AMPLITUDE: 0.75 V
MDC IDC SET LEADCHNL RV PACING PULSEWIDTH: 0.4 ms
MDC IDC STAT BRADY AS VS PERCENT: 22 %
MDC IDC STAT BRADY RA PERCENT PACED: 1 %
MDC IDC STAT BRADY RV PERCENT PACED: 78 %

## 2015-04-29 ENCOUNTER — Encounter: Payer: Self-pay | Admitting: Cardiology

## 2015-05-01 ENCOUNTER — Encounter: Payer: Self-pay | Admitting: Internal Medicine

## 2015-05-05 ENCOUNTER — Emergency Department (HOSPITAL_COMMUNITY): Payer: Medicare Other

## 2015-05-05 ENCOUNTER — Encounter (HOSPITAL_COMMUNITY): Payer: Self-pay

## 2015-05-05 ENCOUNTER — Inpatient Hospital Stay (HOSPITAL_COMMUNITY)
Admission: EM | Admit: 2015-05-05 | Discharge: 2015-05-07 | DRG: 602 | Disposition: A | Discharge status: Home / Self Care | Payer: Medicare Other | Attending: Internal Medicine | Admitting: Internal Medicine

## 2015-05-05 DIAGNOSIS — I482 Chronic atrial fibrillation, unspecified: Secondary | ICD-10-CM | POA: Diagnosis present

## 2015-05-05 DIAGNOSIS — I878 Other specified disorders of veins: Secondary | ICD-10-CM | POA: Diagnosis present

## 2015-05-05 DIAGNOSIS — R479 Unspecified speech disturbances: Secondary | ICD-10-CM

## 2015-05-05 DIAGNOSIS — L0390 Cellulitis, unspecified: Principal | ICD-10-CM | POA: Diagnosis present

## 2015-05-05 DIAGNOSIS — I5032 Chronic diastolic (congestive) heart failure: Secondary | ICD-10-CM | POA: Diagnosis present

## 2015-05-05 DIAGNOSIS — R0602 Shortness of breath: Secondary | ICD-10-CM | POA: Diagnosis not present

## 2015-05-05 DIAGNOSIS — G629 Polyneuropathy, unspecified: Secondary | ICD-10-CM

## 2015-05-05 DIAGNOSIS — I10 Essential (primary) hypertension: Secondary | ICD-10-CM | POA: Diagnosis present

## 2015-05-05 DIAGNOSIS — I4891 Unspecified atrial fibrillation: Secondary | ICD-10-CM | POA: Diagnosis present

## 2015-05-05 DIAGNOSIS — I959 Hypotension, unspecified: Secondary | ICD-10-CM | POA: Diagnosis present

## 2015-05-05 DIAGNOSIS — I2510 Atherosclerotic heart disease of native coronary artery without angina pectoris: Secondary | ICD-10-CM | POA: Diagnosis present

## 2015-05-05 DIAGNOSIS — Z8249 Family history of ischemic heart disease and other diseases of the circulatory system: Secondary | ICD-10-CM

## 2015-05-05 DIAGNOSIS — R509 Fever, unspecified: Secondary | ICD-10-CM | POA: Insufficient documentation

## 2015-05-05 DIAGNOSIS — Z87891 Personal history of nicotine dependence: Secondary | ICD-10-CM

## 2015-05-05 DIAGNOSIS — Z950 Presence of cardiac pacemaker: Secondary | ICD-10-CM | POA: Diagnosis present

## 2015-05-05 DIAGNOSIS — I447 Left bundle-branch block, unspecified: Secondary | ICD-10-CM | POA: Diagnosis present

## 2015-05-05 DIAGNOSIS — Z951 Presence of aortocoronary bypass graft: Secondary | ICD-10-CM

## 2015-05-05 DIAGNOSIS — Z7901 Long term (current) use of anticoagulants: Secondary | ICD-10-CM

## 2015-05-05 DIAGNOSIS — Z8572 Personal history of non-Hodgkin lymphomas: Secondary | ICD-10-CM

## 2015-05-05 DIAGNOSIS — L97209 Non-pressure chronic ulcer of unspecified calf with unspecified severity: Secondary | ICD-10-CM | POA: Diagnosis present

## 2015-05-05 DIAGNOSIS — R531 Weakness: Secondary | ICD-10-CM

## 2015-05-05 DIAGNOSIS — E872 Acidosis: Secondary | ICD-10-CM | POA: Diagnosis present

## 2015-05-05 DIAGNOSIS — G9340 Encephalopathy, unspecified: Secondary | ICD-10-CM | POA: Diagnosis present

## 2015-05-05 DIAGNOSIS — G4700 Insomnia, unspecified: Secondary | ICD-10-CM | POA: Diagnosis present

## 2015-05-05 LAB — URINALYSIS, ROUTINE W REFLEX MICROSCOPIC
BILIRUBIN URINE: NEGATIVE
Glucose, UA: NEGATIVE mg/dL
KETONES UR: NEGATIVE mg/dL
Leukocytes, UA: NEGATIVE
NITRITE: NEGATIVE
PROTEIN: NEGATIVE mg/dL
Specific Gravity, Urine: 1.013 (ref 1.005–1.030)
Urobilinogen, UA: 0.2 mg/dL (ref 0.0–1.0)
pH: 6 (ref 5.0–8.0)

## 2015-05-05 LAB — BASIC METABOLIC PANEL
ANION GAP: 10 (ref 5–15)
BUN: 14 mg/dL (ref 6–20)
CALCIUM: 8.8 mg/dL — AB (ref 8.9–10.3)
CHLORIDE: 102 mmol/L (ref 101–111)
CO2: 25 mmol/L (ref 22–32)
CREATININE: 1.12 mg/dL (ref 0.61–1.24)
GFR, EST NON AFRICAN AMERICAN: 56 mL/min — AB (ref >60)
Glucose, Bld: 122 mg/dL — ABNORMAL HIGH (ref 65–99)
Potassium: 4 mmol/L (ref 3.5–5.1)
SODIUM: 137 mmol/L (ref 135–145)

## 2015-05-05 LAB — CBC
HEMATOCRIT: 42.4 % (ref 39.0–52.0)
Hemoglobin: 14.3 g/dL (ref 13.0–17.0)
MCH: 34.1 pg — ABNORMAL HIGH (ref 26.0–34.0)
MCHC: 33.7 g/dL (ref 30.0–36.0)
MCV: 101.2 fL — AB (ref 78.0–100.0)
PLATELETS: 185 10*3/uL (ref 150–400)
RBC: 4.19 MIL/uL — ABNORMAL LOW (ref 4.22–5.81)
RDW: 14.2 % (ref 11.5–15.5)
WBC: 12.5 10*3/uL — AB (ref 4.0–10.5)

## 2015-05-05 LAB — I-STAT CG4 LACTIC ACID, ED: Lactic Acid, Venous: 2.76 mmol/L (ref 0.5–2.0)

## 2015-05-05 LAB — URINE MICROSCOPIC-ADD ON

## 2015-05-05 MED ORDER — SODIUM CHLORIDE 0.9 % IV BOLUS (SEPSIS)
500.0000 mL | Freq: Once | INTRAVENOUS | Status: AC
  Administered 2015-05-05: 500 mL via INTRAVENOUS

## 2015-05-05 MED ORDER — SODIUM CHLORIDE 0.9 % IV BOLUS (SEPSIS): 1000.0000 mL | Freq: Once | INTRAVENOUS | Status: DC

## 2015-05-05 NOTE — ED Notes (Signed)
Pt arrived via EMS c/o fever and weakness.  EMS gave 1,000mg  children's tylenol PO, 1,09ml NS bolus.  Pt took 0.25mg  Xanax at home prior to transport.

## 2015-05-05 NOTE — ED Notes (Signed)
Report attempted, charge nurse unavailable also

## 2015-05-05 NOTE — ED Notes (Signed)
Report attempted 

## 2015-05-05 NOTE — ED Provider Notes (Signed)
CSN: [id number]     Arrival date & time 05/05/15  1931 History   First MD Initiated Contact with Patient 05/05/15 1932     Chief Complaint  Patient presents with  . Fever  . Weakness     (Consider location/radiation/quality/duration/timing/severity/associated sxs/prior Treatment) Patient is a 81 y.o. male presenting with fever and weakness. The history is provided by the patient.  Fever Max temp prior to arrival:  100.8 Temp source:  Oral Severity:  Mild Onset quality:  Gradual Duration:  5 hours Timing:  Constant Progression:  Unchanged Chronicity:  New Relieved by:  Nothing Worsened by:  Nothing tried Ineffective treatments:  Acetaminophen (Patient took a Xanax prior to coming because he was nervous) Associated symptoms: chills and confusion   Associated symptoms: no cough, no diarrhea, no nausea and no vomiting   Weakness Associated symptoms include shortness of breath. Pertinent negatives include no abdominal pain.    Past Medical History  Diagnosis Date  . Hypertension   . CHF (congestive heart failure)   . Arrhythmia     afib  . Neuropathy   . Cancer     lymphoma  . Heart attack 1986  . ED (erectile dysfunction)   . Coronary artery disease   . Non Hodgkin's lymphoma   . PE (pulmonary embolism)   . GERD (gastroesophageal reflux disease)   . Hyperlipidemia   . Gout   . Peptic ulcer disease   . DJD (degenerative joint disease)   . PAC (premature atrial contraction)   . CVI (common variable immunodeficiency)   . Venous stasis ulcers   . IFG (impaired fasting glucose)    Past Surgical History  Procedure Laterality Date  . Pacemaker insertion  08/2010  . Cardiac surgery    . Eye surgery  10/2006, 11/2006  . Back and knee surgeries  unknown  . Tonsillectomy  1972  . Nasal sinus surgery  1980  . Coronary artery bypass graft  1990  . Elbow surgery      r/t gout   Family History  Problem Relation Age of Onset  . Diabetes type II Sister   . Heart disease  Brother   . Diabetes type II Daughter    History  Substance Use Topics  . Smoking status: Former Smoker -- 1.00 packs/day for 15 years    Types: Cigarettes    Quit date: 11/13/1944  . Smokeless tobacco: Never Used  . Alcohol Use: No    Review of Systems  Constitutional: Positive for fever and chills.  Respiratory: Positive for shortness of breath. Negative for cough.   Gastrointestinal: Negative for nausea, vomiting, abdominal pain and diarrhea.  Neurological: Positive for weakness.  Psychiatric/Behavioral: Positive for confusion.  All other systems reviewed and are negative.     Allergies  Aleve; Avelox; Codeine; Digoxin; Indomethacin; Levaquin; Lipitor; Lorazepam; Norpace; Pravachol; Uloric; Amlodipine besylate; Azithromycin; Bactrim; Clinoril; Percocet; and Sulfonamide derivatives  Home Medications   Prior to Admission medications   Medication Sig Start Date End Date Taking? Authorizing Provider  acetaminophen (TYLENOL) 500 MG tablet Take 1,000 mg by mouth at bedtime.     Historical Provider, MD  allopurinol (ZYLOPRIM) 300 MG tablet Take 300 mg by mouth daily.     Historical Provider, MD  ALPRAZolam Duanne Moron) 0.5 MG tablet Take 0.5 mg by mouth at bedtime as needed. For sleep    Historical Provider, MD  aspirin 81 MG tablet Take 81 mg by mouth every other day.     Historical Provider, MD  atenolol (TENORMIN) 50 MG tablet Take 50 mg by mouth daily.     Historical Provider, MD  cholecalciferol (VITAMIN D) 1000 UNITS tablet Take 2,000 Units by mouth daily.     Historical Provider, MD  colchicine 0.6 MG tablet Take 0.6 mg by mouth daily as needed (gout).     Historical Provider, MD  furosemide (LASIX) 80 MG tablet Take 80 mg by mouth daily.    Historical Provider, MD  gabapentin (NEURONTIN) 100 MG capsule Take 100 mg by mouth 3 (three) times daily.     Historical Provider, MD  Magnesium 250 MG TABS Take 1-2 tablets by mouth daily.    Historical Provider, MD  meclizine (ANTIVERT)  12.5 MG tablet Take 6.25 mg by mouth 3 (three) times daily as needed. For dizziness     Historical Provider, MD  metolazone (ZAROXOLYN) 2.5 MG tablet Take 2.5 mg by mouth. Take 30 mins before taking furosemide as needed    Historical Provider, MD  mupirocin ointment (BACTROBAN) 2 % Apply 1 application topically daily as needed (infected area).     Historical Provider, MD  nitroGLYCERIN (NITROSTAT) 0.4 MG SL tablet Place 0.4 mg under the tongue every 5 (five) minutes as needed. For chest pain    Historical Provider, MD  Omega-3 Fatty Acids (FISH OIL) 1000 MG CAPS Take 1 capsule by mouth daily.    Historical Provider, MD  potassium chloride SA (K-DUR,KLOR-CON) 20 MEQ tablet Take 20 mEq by mouth 2 (two) times daily.     Historical Provider, MD  terbinafine (LAMISIL) 250 MG tablet Take 1 tablet (250 mg total) by mouth daily. 12/04/13   [name], MD  warfarin (COUMADIN) 2.5 MG tablet Take 2.5 mg by mouth daily.    Historical Provider, MD   BP 128/65 mmHg  Pulse 92  Resp 20  SpO2 94% Physical Exam  Constitutional: He is oriented to person, place, and time. He appears well-developed and well-nourished. No distress.  HENT:  Head: Normocephalic and atraumatic.  Mouth/Throat: No oropharyngeal exudate.  Eyes: EOM are normal. Pupils are equal, round, and reactive to light.  Neck: Normal range of motion. Neck supple.  Cardiovascular: Normal rate and regular rhythm.  Exam reveals no friction rub.   No murmur heard. Pulmonary/Chest: Effort normal and breath sounds normal. No respiratory distress. He has no wheezes. He has no rales.  Abdominal: Soft. He exhibits no distension. There is no tenderness. There is no rebound.  Musculoskeletal: Normal range of motion. He exhibits no edema.  Neurological: He is alert and oriented to person, place, and time. No cranial nerve deficit. He exhibits normal muscle tone. Coordination normal.  Skin: Skin is warm. No rash noted. He is not diaphoretic.  Nursing  note and vitals reviewed.   ED Course  Procedures (including critical care time) Labs Review Labs Reviewed  CULTURE, BLOOD (ROUTINE X 2)  CULTURE, BLOOD (ROUTINE X 2)  URINE CULTURE  CBC  BASIC METABOLIC PANEL  URINALYSIS, ROUTINE W REFLEX MICROSCOPIC (NOT AT [facility])  I-STAT CG4 LACTIC ACID, ED    Imaging Review Dg Chest 2 View  05/05/2015   CLINICAL DATA:  Shortness of breath, hypertension.  EXAM: CHEST  2 VIEW  COMPARISON:  11/29/2011  FINDINGS: Dual lead left-sided pacemaker remains in place. Patient is post median sternotomy. Lung volumes are low. Mild cardiomegaly, unchanged from prior. Central peribronchial cuffing, unchanged. No confluent airspace disease. Mild vascular congestion, no frank pulmonary edema. No pleural effusion or pneumothorax. Compression deformity in the  lower thoracic spine, unchanged.  IMPRESSION: Mild vascular congestion without frank pulmonary edema. Cardiomegaly, grossly stable.   Electronically Signed   By: Jeb Levering M.D.   On: 05/05/2015 21:16     EKG Interpretation   Date/Time:  Sunday May 05 2015 19:39:27 EDT Ventricular Rate:  97 PR Interval:    QRS Duration: 141 QT Interval:  454 QTC Calculation: 577 R Axis:   -63 Text Interpretation:  Atrial fibrillation Left bundle branch block No  significant change since last tracing Confirmed by Mingo Amber  MD, Ladavion Savitz  ([id number]) on 05/05/2015 7:53:39 PM      MDM   Final diagnoses:  Shortness of breath    29M here with fever, confusion, chills. Began today. He states he's feeling well. Son reports he had labored breathing while sleeping. Febrile to 100.8. Normotensive, no hypoxia. Denies SOB/cough. Will plan for CXR, labs, cultures. Labs with mild white count, negative urine. Negative CXR. Patient admitted. Antibiotics held since no definitive source noted.  Evelina Bucy, MD 05/05/15 [phone]

## 2015-05-05 NOTE — ED Notes (Signed)
Dr. Patel at bedside 

## 2015-05-06 ENCOUNTER — Inpatient Hospital Stay (HOSPITAL_COMMUNITY): Payer: Medicare Other

## 2015-05-06 ENCOUNTER — Encounter (HOSPITAL_COMMUNITY): Payer: Self-pay | Admitting: Radiology

## 2015-05-06 DIAGNOSIS — Z950 Presence of cardiac pacemaker: Secondary | ICD-10-CM | POA: Diagnosis not present

## 2015-05-06 DIAGNOSIS — Z7901 Long term (current) use of anticoagulants: Secondary | ICD-10-CM | POA: Diagnosis not present

## 2015-05-06 DIAGNOSIS — E872 Acidosis: Secondary | ICD-10-CM | POA: Diagnosis present

## 2015-05-06 DIAGNOSIS — L03119 Cellulitis of unspecified part of limb: Secondary | ICD-10-CM | POA: Diagnosis not present

## 2015-05-06 DIAGNOSIS — Z87891 Personal history of nicotine dependence: Secondary | ICD-10-CM | POA: Diagnosis not present

## 2015-05-06 DIAGNOSIS — L0390 Cellulitis, unspecified: Secondary | ICD-10-CM | POA: Diagnosis present

## 2015-05-06 DIAGNOSIS — Z8572 Personal history of non-Hodgkin lymphomas: Secondary | ICD-10-CM | POA: Diagnosis not present

## 2015-05-06 DIAGNOSIS — R531 Weakness: Secondary | ICD-10-CM

## 2015-05-06 DIAGNOSIS — L97209 Non-pressure chronic ulcer of unspecified calf with unspecified severity: Secondary | ICD-10-CM | POA: Diagnosis present

## 2015-05-06 DIAGNOSIS — I10 Essential (primary) hypertension: Secondary | ICD-10-CM | POA: Diagnosis present

## 2015-05-06 DIAGNOSIS — Z951 Presence of aortocoronary bypass graft: Secondary | ICD-10-CM

## 2015-05-06 DIAGNOSIS — I2510 Atherosclerotic heart disease of native coronary artery without angina pectoris: Secondary | ICD-10-CM | POA: Diagnosis present

## 2015-05-06 DIAGNOSIS — I878 Other specified disorders of veins: Secondary | ICD-10-CM | POA: Diagnosis present

## 2015-05-06 DIAGNOSIS — G629 Polyneuropathy, unspecified: Secondary | ICD-10-CM

## 2015-05-06 DIAGNOSIS — Z8249 Family history of ischemic heart disease and other diseases of the circulatory system: Secondary | ICD-10-CM | POA: Diagnosis not present

## 2015-05-06 DIAGNOSIS — R55 Syncope and collapse: Secondary | ICD-10-CM

## 2015-05-06 DIAGNOSIS — G9340 Encephalopathy, unspecified: Secondary | ICD-10-CM | POA: Diagnosis present

## 2015-05-06 DIAGNOSIS — I447 Left bundle-branch block, unspecified: Secondary | ICD-10-CM | POA: Diagnosis present

## 2015-05-06 DIAGNOSIS — I5032 Chronic diastolic (congestive) heart failure: Secondary | ICD-10-CM

## 2015-05-06 DIAGNOSIS — I482 Chronic atrial fibrillation: Secondary | ICD-10-CM | POA: Diagnosis present

## 2015-05-06 DIAGNOSIS — R0602 Shortness of breath: Secondary | ICD-10-CM | POA: Diagnosis present

## 2015-05-06 DIAGNOSIS — G4700 Insomnia, unspecified: Secondary | ICD-10-CM | POA: Diagnosis present

## 2015-05-06 DIAGNOSIS — I959 Hypotension, unspecified: Secondary | ICD-10-CM | POA: Diagnosis present

## 2015-05-06 LAB — CBC WITH DIFFERENTIAL/PLATELET
Basophils Absolute: 0 10*3/uL (ref 0.0–0.1)
Basophils Relative: 0 % (ref 0–1)
EOS ABS: 0.1 10*3/uL (ref 0.0–0.7)
Eosinophils Relative: 1 % (ref 0–5)
HCT: 41.3 % (ref 39.0–52.0)
Hemoglobin: 13.4 g/dL (ref 13.0–17.0)
LYMPHS PCT: 31 % (ref 12–46)
Lymphs Abs: 3.5 10*3/uL (ref 0.7–4.0)
MCH: 33.2 pg (ref 26.0–34.0)
MCHC: 32.4 g/dL (ref 30.0–36.0)
MCV: 102.2 fL — AB (ref 78.0–100.0)
MONOS PCT: 12 % (ref 3–12)
Monocytes Absolute: 1.3 10*3/uL — ABNORMAL HIGH (ref 0.1–1.0)
Neutro Abs: 6.1 10*3/uL (ref 1.7–7.7)
Neutrophils Relative %: 56 % (ref 43–77)
Platelets: 174 10*3/uL (ref 150–400)
RBC: 4.04 MIL/uL — ABNORMAL LOW (ref 4.22–5.81)
RDW: 14.3 % (ref 11.5–15.5)
WBC: 11.1 10*3/uL — AB (ref 4.0–10.5)

## 2015-05-06 LAB — COMPREHENSIVE METABOLIC PANEL
ALT: 12 U/L — ABNORMAL LOW (ref 17–63)
AST: 23 U/L (ref 15–41)
Albumin: 2.7 g/dL — ABNORMAL LOW (ref 3.5–5.0)
Alkaline Phosphatase: 54 U/L (ref 38–126)
Anion gap: 5 (ref 5–15)
BUN: 13 mg/dL (ref 6–20)
CALCIUM: 8.7 mg/dL — AB (ref 8.9–10.3)
CHLORIDE: 106 mmol/L (ref 101–111)
CO2: 27 mmol/L (ref 22–32)
CREATININE: 1.12 mg/dL (ref 0.61–1.24)
GFR, EST NON AFRICAN AMERICAN: 56 mL/min — AB (ref >60)
Glucose, Bld: 119 mg/dL — ABNORMAL HIGH (ref 65–99)
POTASSIUM: 4.2 mmol/L (ref 3.5–5.1)
SODIUM: 138 mmol/L (ref 135–145)
Total Bilirubin: 1 mg/dL (ref 0.3–1.2)
Total Protein: 6 g/dL — ABNORMAL LOW (ref 6.5–8.1)

## 2015-05-06 LAB — INFLUENZA PANEL BY PCR (TYPE A & B)
H1N1FLUPCR: NOT DETECTED
INFLBPCR: NEGATIVE
Influenza A By PCR: NEGATIVE

## 2015-05-06 LAB — GLUCOSE, CAPILLARY: Glucose-Capillary: 101 mg/dL — ABNORMAL HIGH (ref 65–99)

## 2015-05-06 LAB — LACTIC ACID, PLASMA: Lactic Acid, Venous: 1.9 mmol/L (ref 0.5–2.0)

## 2015-05-06 LAB — PROTIME-INR
INR: 1.81 — AB (ref 0.00–1.49)
Prothrombin Time: 20.9 seconds — ABNORMAL HIGH (ref 11.6–15.2)

## 2015-05-06 LAB — PROCALCITONIN: Procalcitonin: <0.1 ng/mL

## 2015-05-06 LAB — APTT: aPTT: 46 seconds — ABNORMAL HIGH (ref 24–37)

## 2015-05-06 LAB — TSH: TSH: 1.293 u[IU]/mL (ref 0.350–4.500)

## 2015-05-06 MED ORDER — ALLOPURINOL 300 MG PO TABS
300.0000 mg | ORAL_TABLET | Freq: Every day | ORAL | Status: DC
  Administered 2015-05-06 – 2015-05-07 (×2): 300 mg via ORAL
  Filled 2015-05-06 (×2): qty 1

## 2015-05-06 MED ORDER — ACETAMINOPHEN 650 MG RE SUPP: 650.0000 mg | Freq: Four times a day (QID) | RECTAL | Status: DC | PRN

## 2015-05-06 MED ORDER — ASPIRIN EC 81 MG PO TBEC
81.0000 mg | DELAYED_RELEASE_TABLET | ORAL | Status: DC
  Administered 2015-05-06: 81 mg via ORAL
  Filled 2015-05-06: qty 1

## 2015-05-06 MED ORDER — ONDANSETRON HCL 4 MG PO TABS: 4.0000 mg | ORAL_TABLET | Freq: Four times a day (QID) | ORAL | Status: DC | PRN

## 2015-05-06 MED ORDER — GABAPENTIN 100 MG PO CAPS
100.0000 mg | ORAL_CAPSULE | Freq: Three times a day (TID) | ORAL | Status: DC
  Administered 2015-05-06 – 2015-05-07 (×5): 100 mg via ORAL
  Filled 2015-05-06 (×7): qty 1

## 2015-05-06 MED ORDER — ACETAMINOPHEN 325 MG PO TABS
650.0000 mg | ORAL_TABLET | Freq: Four times a day (QID) | ORAL | Status: DC | PRN
  Administered 2015-05-06: 650 mg via ORAL
  Filled 2015-05-06: qty 2

## 2015-05-06 MED ORDER — SODIUM CHLORIDE 0.9 % IJ SOLN
3.0000 mL | Freq: Two times a day (BID) | INTRAMUSCULAR | Status: DC
  Administered 2015-05-06 (×3): 3 mL via INTRAVENOUS

## 2015-05-06 MED ORDER — CEFTRIAXONE SODIUM IN DEXTROSE 20 MG/ML IV SOLN
1.0000 g | INTRAVENOUS | Status: DC
  Filled 2015-05-06: qty 50

## 2015-05-06 MED ORDER — WARFARIN - PHARMACIST DOSING INPATIENT
Freq: Every day | Status: DC
  Administered 2015-05-06: 1

## 2015-05-06 MED ORDER — DEXTROSE 5 % IV SOLN
1.0000 g | INTRAVENOUS | Status: DC
  Administered 2015-05-06: 1 g via INTRAVENOUS
  Filled 2015-05-06: qty 10

## 2015-05-06 MED ORDER — ONDANSETRON HCL 4 MG/2ML IJ SOLN: 4.0000 mg | Freq: Four times a day (QID) | INTRAMUSCULAR | Status: DC | PRN

## 2015-05-06 MED ORDER — ALPRAZOLAM 0.5 MG PO TABS
0.5000 mg | ORAL_TABLET | Freq: Every day | ORAL | Status: DC
  Administered 2015-05-06 (×2): 0.5 mg via ORAL
  Filled 2015-05-06 (×2): qty 1

## 2015-05-06 MED ORDER — CLINDAMYCIN PHOSPHATE 600 MG/50ML IV SOLN
600.0000 mg | Freq: Three times a day (TID) | INTRAVENOUS | Status: DC
  Filled 2015-05-06 (×2): qty 50

## 2015-05-06 MED ORDER — CEFTRIAXONE SODIUM IN DEXTROSE 20 MG/ML IV SOLN
1.0000 g | INTRAVENOUS | Status: DC
  Administered 2015-05-07: 1 g via INTRAVENOUS
  Filled 2015-05-06 (×2): qty 50

## 2015-05-06 MED ORDER — WARFARIN SODIUM 5 MG PO TABS
5.0000 mg | ORAL_TABLET | Freq: Once | ORAL | Status: AC
  Administered 2015-05-06: 5 mg via ORAL
  Filled 2015-05-06: qty 1

## 2015-05-06 NOTE — Progress Notes (Signed)
*  PRELIMINARY RESULTS* Echocardiogram 2D Echocardiogram has been performed.  Leavy Cella 05/06/2015, 11:05 AM

## 2015-05-06 NOTE — Progress Notes (Signed)
Paged md regarding ted hose and scd's. Patient family is requesting these item. Told them i would page the md to find out if he wanted him to have those.  Pt has open areas on both legs, and md may not want patient to wear those.

## 2015-05-06 NOTE — Progress Notes (Signed)
Patient Demographics  [name], is a 81 y.o. male, DOB - [birth year], JME:[id number]  Admit date - 05/05/2015   Admitting Physician Lavina Hamman, MD  Outpatient Primary MD for the patient is Irven Shelling, MD  LOS - 0   Chief Complaint  Patient presents with  . Fever  . Weakness       Admission HPI/Brief narrative: 81 y.o. male with Past medical history of hypertension, CHF, neuropathy, non-Hodgkin's lymphoma GERD, chronic venous stasis with discoloration of the skin and recurrent cellulitis . Presents with complaints of altered mental status, has no leukocytosis, CT head without acute findings, has mild leukocytosis, and evidence of cellulitis.  Subjective:   [name] today has, No headache, No chest pain, No abdominal pain - No Nausea, No new weakness tingling or numbness, No Cough - SOB.   Assessment & Plan    Principal Problem:   Cellulitis Active Problems:   Atrial fibrillation   Chronic diastolic heart failure   PPM-St.Jude   Coronary artery disease   Neuropathy   Generalized weakness   Hx of CABG   Acute encephalopathy  Cellulitis - Presents with generalized weakness and fatigue, has lactic acidosis, and mild leukocytosis. - Continue with ceftriaxone for treatment for his cellulitis as it has been effective in the past infections. - Follow on blood cultures.  Acute encephalopathy - Resolved, no acute finding in CT head, dated the cellulitis.  Chronic diastolic CHF - Resume Lasix and Zaroxolyn whenmore stable, blood pressure on the lower side ,monitor closely for volume overload.   Coronary artery disease status post CABG - Denies any chest pain or shortness of breath, continue with aspirin, resume atenolol when blood pressure improves.  Atrial fibrillation - Rate controlled, on warfarin, pharmacy to dose  Code Status: Full  Family Communication: none  at bedside.  Disposition Plan: pending PT evaluation   Procedures  None   Consults   none   Medications  Scheduled Meds: . allopurinol  300 mg Oral Daily  . ALPRAZolam  0.5 mg Oral QHS  . aspirin EC  81 mg Oral QODAY  . [START ON 05/07/2015] cefTRIAXone (ROCEPHIN)  IV  1 g Intravenous Q24H  . gabapentin  100 mg Oral TID  . sodium chloride  3 mL Intravenous Q12H  . warfarin  5 mg Oral ONCE-1800  . Warfarin - Pharmacist Dosing Inpatient   Does not apply q1800   Continuous Infusions:  PRN Meds:.acetaminophen **OR** acetaminophen, ondansetron **OR** ondansetron (ZOFRAN) IV  DVT Prophylaxis on warfarin  Lab Results  Component Value Date   PLT 174 05/06/2015    Antibiotics    Anti-infectives    Start     Dose/Rate Route Frequency Ordered Stop   05/07/15 0400  cefTRIAXone (ROCEPHIN) 1 g in dextrose 5 % 50 mL IVPB - Premix     1 g 100 mL/hr over 30 Minutes Intravenous Every 24 hours 05/06/15 0633     05/06/15 0630  clindamycin (CLEOCIN) IVPB 600 mg  Status:  Discontinued     600 mg 100 mL/hr over 30 Minutes Intravenous 3 times per day 05/06/15 0620 05/06/15 0633   05/06/15 0015  cefTRIAXone (ROCEPHIN) 1 g in dextrose 5 % 50 mL IVPB  Status:  Discontinued  1 g 100 mL/hr over 30 Minutes Intravenous Every 24 hours 05/06/15 0008 05/06/15 0620          Objective:   Filed Vitals:   05/06/15 0051 05/06/15 0655 05/06/15 0658 05/06/15 1025  BP: 113/56   132/58  Pulse: 61   64  Temp: 97.8 F (36.6 C) 98 F (36.7 C)  97.7 F (36.5 C)  TempSrc: Oral Oral  Oral  Resp: 20   20  Height: 5\' 4"  (1.626 m)     Weight: 87.136 kg (192 lb 1.6 oz)     SpO2: 97%  99% 98%    Wt Readings from Last 3 Encounters:  05/06/15 87.136 kg (192 lb 1.6 oz)  10/09/14 87.272 kg (192 lb 6.4 oz)  01/22/14 84.369 kg (186 lb)     Intake/Output Summary (Last 24 hours) at 05/06/15 1131 Last data filed at 05/06/15 0829  Gross per 24 hour  Intake    530 ml  Output    350 ml  Net    180  ml     Physical Exam  Awake Alert, Oriented X 3, No new F.N deficits, Normal affect Dwight.AT,PERRAL Supple Neck,No JVD, No cervical lymphadenopathy appriciated.  Symmetrical Chest wall movement, Good air movement bilaterally, CTAB ,No Gallops,Rubs or new Murmurs, No Parasternal Heave +ve B.Sounds, Abd Soft, No tenderness, No organomegaly appriciated, No rebound - guarding or rigidity. No Cyanosis, Club, mild pedal edema, chronic venous status changes, erythema and swelling.   Data Review   Micro Results No results found for this or any previous visit (from the past 240 hour(s)).  Radiology Reports Dg Chest 2 View  05/05/2015   CLINICAL DATA:  Shortness of breath, hypertension.  EXAM: CHEST  2 VIEW  COMPARISON:  11/29/2011  FINDINGS: Dual lead left-sided pacemaker remains in place. Patient is post median sternotomy. Lung volumes are low. Mild cardiomegaly, unchanged from prior. Central peribronchial cuffing, unchanged. No confluent airspace disease. Mild vascular congestion, no frank pulmonary edema. No pleural effusion or pneumothorax. Compression deformity in the lower thoracic spine, unchanged.  IMPRESSION: Mild vascular congestion without frank pulmonary edema. Cardiomegaly, grossly stable.   Electronically Signed   By: Jeb Levering M.D.   On: 05/05/2015 21:16   Ct Head Wo Contrast  05/06/2015   CLINICAL DATA:  Weakness.  Speech abnormality.  EXAM: CT HEAD WITHOUT CONTRAST  TECHNIQUE: Contiguous axial images were obtained from the base of the skull through the vertex without intravenous contrast.  COMPARISON:  08/31/2010  FINDINGS: There is no intracranial hemorrhage, mass or evidence of acute infarction. There is moderately severe generalized atrophy. There is white matter hypodensity consistent with chronic small vessel ischemic disease, and this is mildly worsened from 2011. No acute intracranial findings are evident. No bony abnormality is evident.  IMPRESSION: Generalized atrophy  and chronic microvascular ischemic disease. No acute intracranial findings.   Electronically Signed   By: Andreas Newport M.D.   On: 05/06/2015 05:00     CBC  Recent Labs Lab 05/05/15 2019 05/06/15 0250  WBC 12.5* 11.1*  HGB 14.3 13.4  HCT 42.4 41.3  PLT 185 174  MCV 101.2* 102.2*  MCH 34.1* 33.2  MCHC 33.7 32.4  RDW 14.2 14.3  LYMPHSABS  --  3.5  MONOABS  --  1.3*  EOSABS  --  0.1  BASOSABS  --  0.0    Chemistries   Recent Labs Lab 05/05/15 2019 05/06/15 0250  NA 137 138  K 4.0 4.2  CL 102 106  CO2 25 27  GLUCOSE 122* 119*  BUN 14 13  CREATININE 1.12 1.12  CALCIUM 8.8* 8.7*  AST  --  23  ALT  --  12*  ALKPHOS  --  54  BILITOT  --  1.0   ------------------------------------------------------------------------------------------------------------------ estimated creatinine clearance is 43.7 mL/min (by C-G formula based on Cr of 1.12). ------------------------------------------------------------------------------------------------------------------ No results for input(s): HGBA1C in the last 72 hours. ------------------------------------------------------------------------------------------------------------------ No results for input(s): CHOL, HDL, LDLCALC, TRIG, CHOLHDL, LDLDIRECT in the last 72 hours. ------------------------------------------------------------------------------------------------------------------  Recent Labs  05/06/15 0250  TSH 1.293   ------------------------------------------------------------------------------------------------------------------ No results for input(s): VITAMINB12, FOLATE, FERRITIN, TIBC, IRON, RETICCTPCT in the last 72 hours.  Coagulation profile  Recent Labs Lab 05/06/15 0250  INR 1.81*    No results for input(s): DDIMER in the last 72 hours.  Cardiac Enzymes No results for input(s): CKMB, TROPONINI, MYOGLOBIN in the last 168 hours.  Invalid input(s):  CK ------------------------------------------------------------------------------------------------------------------ Invalid input(s): POCBNP     Time Spent in minutes  No charge   [facility], Kaile Bixler M.D on 05/06/2015 at 11:31 AM  Between 7am to 7pm - Pager - [phone]  After 7pm go to [email or web address] - password [facility]  [facility]   Office  [phone]

## 2015-05-06 NOTE — Discharge Instructions (Signed)
Follow with Primary MD GRIFFIN,JOHN JOSEPH, MD in 7 days  ° °Get CBC, CMP, 2 view Chest X ray checked  by Primary MD next visit.  ° ° °Activity: As tolerated with Full fall precautions use walker/cane & assistance as needed ° ° °Disposition Home  ° ° °Diet: Heart Healthy  , with feeding assistance and aspiration precautions. ° °For Heart failure patients - Check your Weight same time everyday, if you gain over 2 pounds, or you develop in leg swelling, experience more shortness of breath or chest pain, call your Primary MD immediately. Follow Cardiac Low Salt Diet and 1.5 lit/day fluid restriction. ° ° °On your next visit with your primary care physician please Get Medicines reviewed and adjusted. ° ° °Please request your Prim.MD to go over all Hospital Tests and Procedure/Radiological results at the follow up, please get all Hospital records sent to your Prim MD by signing hospital release before you go home. ° ° °If you experience worsening of your admission symptoms, develop shortness of breath, life threatening emergency, suicidal or homicidal thoughts you must seek medical attention immediately by calling 911 or calling your MD immediately  if symptoms less severe. ° °You Must read complete instructions/literature along with all the possible adverse reactions/side effects for all the Medicines you take and that have been prescribed to you. Take any new Medicines after you have completely understood and accpet all the possible adverse reactions/side effects.  ° °Do not drive, operating heavy machinery, perform activities at heights, swimming or participation in water activities or provide baby sitting services if your were admitted for syncope or siezures until you have seen by Primary MD or a Neurologist and advised to do so again. ° °Do not drive when taking Pain medications.  ° ° °Do not take more than prescribed Pain, Sleep and Anxiety Medications ° °Special Instructions: If you have smoked or chewed Tobacco   in the last 2 yrs please stop smoking, stop any regular Alcohol  and or any Recreational drug use. ° °Wear Seat belts while driving. ° ° °Please note ° °You were cared for by a hospitalist during your hospital stay. If you have any questions about your discharge medications or the care you received while you were in the hospital after you are discharged, you can call the unit and asked to speak with the hospitalist on call if the hospitalist that took care of you is not available. Once you are discharged, your primary care physician will handle any further medical issues. Please note that NO REFILLS for any discharge medications will be authorized once you are discharged, as it is imperative that you return to your primary care physician (or establish a relationship with a primary care physician if you do not have one) for your aftercare needs so that they can reassess your need for medications and monitor your lab values. ° ° °  ° °

## 2015-05-06 NOTE — H&P (Signed)
[facility] History and Physical  Patient: [name]  MRN: [id number]  DOB: [birth year]  DOS: the patient was seen and examined on 05/06/2015 PCP: Irven Shelling, MD  Referring physician: Dr. Mingo Amber Chief Complaint: generalized fatigue  HPI: [name] is a 81 y.o. male with Past medical history of hypertension, CHF, neuropathy, non-Hodgkin's lymphoma GERD, chronic venous stasis with discoloration of the skin and recurrent cellulitis . The patient is presenting with an episode of confusion. The patient was with his family today at a party for his 89th birthday. The patient after the event went for some rest and suddenly started having shaking chills. This was followed by having fever. Later on he started having confusion was having slurred speech as well. Patient was not oriented and therefore the family call EMS. EMS found the patient was hypotensive and febrile. His blood pressure significantly improved since his arrival. His mentation is also his baseline but he continues to have some speech difficulties which he thinks is secondary to dry mouth on the family does not think that is his baseline.  The patient is coming from home.  At his baseline ambulates with walker And is independent for most of his ADL manages her medication on his own.  Review of Systems: as mentioned in the history of present illness.  A comprehensive review of the other systems is negative.  Past Medical History  Diagnosis Date  . Hypertension   . CHF (congestive heart failure)   . Arrhythmia     afib  . Neuropathy   . Cancer     lymphoma  . Heart attack 1986  . ED (erectile dysfunction)   . Coronary artery disease   . Non Hodgkin's lymphoma   . PE (pulmonary embolism)   . GERD (gastroesophageal reflux disease)   . Hyperlipidemia   . Gout   . Peptic ulcer disease   . DJD (degenerative joint disease)   . PAC (premature atrial contraction)   . CVI (common variable  immunodeficiency)   . Venous stasis ulcers   . IFG (impaired fasting glucose)    Past Surgical History  Procedure Laterality Date  . Pacemaker insertion  08/2010  . Cardiac surgery    . Eye surgery  10/2006, 11/2006  . Back and knee surgeries  unknown  . Tonsillectomy  1972  . Nasal sinus surgery  1980  . Coronary artery bypass graft  1990  . Elbow surgery      r/t gout   Social History:  reports that he quit smoking about 70 years ago. His smoking use included Cigarettes. He has a 15 pack-year smoking history. He has never used smokeless tobacco. He reports that he does not drink alcohol or use illicit drugs.  Allergies  Allergen Reactions  . Avelox [Moxifloxacin Hcl In Nacl] Other (See Comments)    Messed up lungs  . Indomethacin Other (See Comments)    Renal insufficiency  . Levaquin [Levofloxacin] Other (See Comments)    Messed up lungs  . Lipitor [Atorvastatin] Nausea Only, Palpitations and Other (See Comments)    Irregular heart beat also  . Pravachol [Pravastatin Sodium] Nausea Only, Palpitations and Other (See Comments)  . Aleve [Naproxen Sodium] Nausea And Vomiting and Other (See Comments)    GI Upset  . Norpace [Disopyramide Phosphate] Other (See Comments)    Urinary retention  . Amlodipine Besylate Rash  . Azithromycin Rash  . Bactrim [Sulfamethoxazole-Trimethoprim] Rash  . Clinoril [Sulindac] Rash  . Codeine Other (See Comments)  unknown  . Digoxin Rash  . Lorazepam Other (See Comments)    unknown  . Morphine And Related Other (See Comments)    unknown  . Other Other (See Comments)    Tripilenomenon? Per paperwork  . Percocet [Oxycodone-Acetaminophen] Rash  . Sulfonamide Derivatives Rash  . Uloric [Febuxostat] Other (See Comments)    unknown    Family History  Problem Relation Age of Onset  . Diabetes type II Sister   . Heart disease Brother   . Diabetes type II Daughter     Prior to Admission medications   Medication Sig Start Date End Date  Taking? Authorizing Provider  acetaminophen (TYLENOL) 500 MG tablet Take 500 mg by mouth at bedtime.    Yes Historical Provider, MD  allopurinol (ZYLOPRIM) 300 MG tablet Take 300 mg by mouth daily.    Yes Historical Provider, MD  ALPRAZolam Duanne Moron) 0.5 MG tablet Take 0.5 mg by mouth at bedtime. For sleep   Yes Historical Provider, MD  aspirin 81 MG tablet Take 81 mg by mouth every other day.    Yes Historical Provider, MD  Cholecalciferol 2000 UNITS CAPS Take 4,000 Units by mouth daily.   Yes Historical Provider, MD  colchicine 0.6 MG tablet Take 0.6 mg by mouth daily as needed (gout).    Yes Historical Provider, MD  furosemide (LASIX) 80 MG tablet Take 80 mg by mouth daily.   Yes Historical Provider, MD  gabapentin (NEURONTIN) 100 MG capsule Take 100 mg by mouth 3 (three) times daily. Takes at 0900,1700,1900 daily   Yes Historical Provider, MD  metolazone (ZAROXOLYN) 2.5 MG tablet Take 1.25-2.5 mg by mouth as needed (for edema and fluid). Take 30 mins before taking furosemide as needed   Yes Historical Provider, MD  nitroGLYCERIN (NITROSTAT) 0.4 MG SL tablet Place 0.4 mg under the tongue every 5 (five) minutes as needed. For chest pain   Yes Historical Provider, MD  potassium chloride SA (K-DUR,KLOR-CON) 20 MEQ tablet Take 40 mEq by mouth 2 (two) times daily.    Yes Historical Provider, MD  warfarin (COUMADIN) 2.5 MG tablet Take 2.5 mg by mouth daily at 6 PM.    Yes Historical Provider, MD  atenolol (TENORMIN) 50 MG tablet Take 50 mg by mouth daily.     Historical Provider, MD  terbinafine (LAMISIL) 250 MG tablet Take 1 tablet (250 mg total) by mouth daily. 12/04/13   [name], MD    Physical Exam: Filed Vitals:   05/05/15 2245 05/05/15 2300 05/06/15 0018 05/06/15 0051  BP: 98/52 104/46  113/56  Pulse: 64 65  61  Temp:   99.8 F (37.7 C) 97.8 F (36.6 C)  TempSrc:   Rectal Oral  Resp: 22 19  20   Height:    5\' 4"  (1.626 m)  Weight:    87.136 kg (192 lb 1.6 oz)  SpO2: 94% 93%   97%    General: Alert, Awake and Oriented to Time, Place and Person. Appear in mild distress Eyes: PERRL ENT: Oral Mucosa clear moist. Neck: no JVD Cardiovascular: S1 and S2 Present, no Murmur, Peripheral Pulses Present Respiratory: Bilateral Air entry equal and Decreased,  Clear to Auscultation, no Crackles, no wheezes Abdomen: Bowel Sound Present, Soft and non tender Skin: chronic venous stasis changes only with swelling and redness and warmth Extremities: no Pedal edema, no calf tenderness Neurologic: Grossly no focal neuro deficit.  Labs on Admission:  CBC:  Recent Labs Lab 05/05/15 2019 05/06/15 0250  WBC 12.5*  11.1*  NEUTROABS  --  6.1  HGB 14.3 13.4  HCT 42.4 41.3  MCV 101.2* 102.2*  PLT 185 174    CMP     Component Value Date/Time   NA 138 05/06/2015 0250   K 4.2 05/06/2015 0250   CL 106 05/06/2015 0250   CO2 27 05/06/2015 0250   GLUCOSE 119* 05/06/2015 0250   BUN 13 05/06/2015 0250   CREATININE 1.12 05/06/2015 0250   CREATININE 1.09 10/19/2013 1614   CALCIUM 8.7* 05/06/2015 0250   PROT 6.0* 05/06/2015 0250   ALBUMIN 2.7* 05/06/2015 0250   AST 23 05/06/2015 0250   ALT 12* 05/06/2015 0250   ALKPHOS 54 05/06/2015 0250   BILITOT 1.0 05/06/2015 0250   GFRNONAA 56* 05/06/2015 0250   GFRNONAA 60 10/19/2013 1614   GFRAA >60 05/06/2015 0250   GFRAA 70 10/19/2013 1614    No results for input(s): LIPASE, AMYLASE in the last 168 hours.  No results for input(s): CKTOTAL, CKMB, CKMBINDEX, TROPONINI in the last 168 hours. BNP (last 3 results) No results for input(s): BNP in the last 8760 hours.  ProBNP (last 3 results) No results for input(s): PROBNP in the last 8760 hours.   Radiological Exams on Admission: Dg Chest 2 View  05/05/2015   CLINICAL DATA:  Shortness of breath, hypertension.  EXAM: CHEST  2 VIEW  COMPARISON:  11/29/2011  FINDINGS: Dual lead left-sided pacemaker remains in place. Patient is post median sternotomy. Lung volumes are low. Mild  cardiomegaly, unchanged from prior. Central peribronchial cuffing, unchanged. No confluent airspace disease. Mild vascular congestion, no frank pulmonary edema. No pleural effusion or pneumothorax. Compression deformity in the lower thoracic spine, unchanged.  IMPRESSION: Mild vascular congestion without frank pulmonary edema. Cardiomegaly, grossly stable.   Electronically Signed   By: Jeb Levering M.D.   On: 05/05/2015 21:16   Ct Head Wo Contrast  05/06/2015   CLINICAL DATA:  Weakness.  Speech abnormality.  EXAM: CT HEAD WITHOUT CONTRAST  TECHNIQUE: Contiguous axial images were obtained from the base of the skull through the vertex without intravenous contrast.  COMPARISON:  08/31/2010  FINDINGS: There is no intracranial hemorrhage, mass or evidence of acute infarction. There is moderately severe generalized atrophy. There is white matter hypodensity consistent with chronic small vessel ischemic disease, and this is mildly worsened from 2011. No acute intracranial findings are evident. No bony abnormality is evident.  IMPRESSION: Generalized atrophy and chronic microvascular ischemic disease. No acute intracranial findings.   Electronically Signed   By: Andreas Newport M.D.   On: 05/06/2015 05:00   EKG: Independently reviewed. atrial fibrillation, rate controlled.  Assessment/Plan Principal Problem:   Cellulitis Active Problems:   Atrial fibrillation   Chronic diastolic heart failure   PPM-St.Jude   Coronary artery disease   Neuropathy   Generalized weakness   Hx of CABG   Acute encephalopathy   1. Cellulitis The patient is presenting with complaints of generalized weakness and fatigue. He is found to have leukocytosis with temperature as well as lactic acidosis. This meeting SIRS criteria. Lower extremity appears to be warm and red and tender. Consistent with cellulitis. We'll treat him with ceftriaxone which has been effective for the past infections. Follow cultures.  2.Acute  encephalopathy. Speech difficulty. The patient also has acute encephalopathy presented with confusion currently oriented 3. We'll get CT head to rule out any acute abnormality.  3.Chronic diastolic CHF. CAD status post CABG Holding home medication in the setting of lactic acidosis  4. atrial  fibrillation. Chronic anticoagulation. Continue warfarin Currently rate controlled monitor on telemetry.  5. insomnia. Continue Xanax as needed   Advance goals of care discussion: full code  Patient's son will be his power of attorney, is listed in demographics .   DVT Prophylaxis: on chronic therapeutic anticoagulation. Nutrition: Cardiac diet   Family Communication: family was present at bedside, opportunity was given to ask question and all questions were answered satisfactorily at the time of interview. Disposition: Admitted as inpatient, telemetry unit.  Author: Berle Mull, MD [facility] Pager: [phone] 05/06/2015  If 7PM-7AM, please contact night-coverage [email or web address] Password [email or web address]

## 2015-05-06 NOTE — Progress Notes (Signed)
UR completed 

## 2015-05-06 NOTE — Progress Notes (Signed)
ANTICOAGULATION CONSULT NOTE - Initial Consult  Pharmacy Consult for Coumadin Indication: atrial fibrillation  Allergies  Allergen Reactions  . Avelox [Moxifloxacin Hcl In Nacl] Other (See Comments)    Messed up lungs  . Indomethacin Other (See Comments)    Renal insufficiency  . Levaquin [Levofloxacin] Other (See Comments)    Messed up lungs  . Lipitor [Atorvastatin] Nausea Only, Palpitations and Other (See Comments)    Irregular heart beat also  . Pravachol [Pravastatin Sodium] Nausea Only, Palpitations and Other (See Comments)  . Aleve [Naproxen Sodium] Nausea And Vomiting and Other (See Comments)    GI Upset  . Norpace [Disopyramide Phosphate] Other (See Comments)    Urinary retention  . Amlodipine Besylate Rash  . Azithromycin Rash  . Bactrim [Sulfamethoxazole-Trimethoprim] Rash  . Clinoril [Sulindac] Rash  . Codeine Other (See Comments)    unknown  . Digoxin Rash  . Lorazepam Other (See Comments)    unknown  . Morphine And Related Other (See Comments)    unknown  . Other Other (See Comments)    Tripilenomenon? Per paperwork  . Percocet [Oxycodone-Acetaminophen] Rash  . Sulfonamide Derivatives Rash  . Uloric [Febuxostat] Other (See Comments)    unknown    Patient Measurements: Height: 5\' 4"  (162.6 cm) Weight: 192 lb 1.6 oz (87.136 kg) (a scale) IBW/kg (Calculated) : 59.2  Vital Signs: Temp: 97.8 F (36.6 C) (06/27 0051) Temp Source: Oral (06/27 0051) BP: 113/56 mmHg (06/27 0051) Pulse Rate: 61 (06/27 0051)  Labs:  Recent Labs  05/05/15 2019 05/06/15 0250  HGB 14.3 13.4  HCT 42.4 41.3  PLT 185 174  APTT  --  46*  LABPROT  --  20.9*  INR  --  1.81*  CREATININE 1.12 1.12    Estimated Creatinine Clearance: 43.7 mL/min (by C-G formula based on Cr of 1.12).   Medications:  Prescriptions prior to admission  Medication Sig Dispense Refill Last Dose  . acetaminophen (TYLENOL) 500 MG tablet Take 500 mg by mouth at bedtime.    05/04/2015 at Unknown  time  . allopurinol (ZYLOPRIM) 300 MG tablet Take 300 mg by mouth daily.    05/05/2015 at Unknown time  . ALPRAZolam (XANAX) 0.5 MG tablet Take 0.5 mg by mouth at bedtime. For sleep   05/04/2015 at Unknown time  . aspirin 81 MG tablet Take 81 mg by mouth every other day.    05/04/2015 at Unknown time  . Cholecalciferol 2000 UNITS CAPS Take 4,000 Units by mouth daily.   05/05/2015 at Unknown time  . colchicine 0.6 MG tablet Take 0.6 mg by mouth daily as needed (gout).    not used  . furosemide (LASIX) 80 MG tablet Take 80 mg by mouth daily.   05/05/2015 at Unknown time  . gabapentin (NEURONTIN) 100 MG capsule Take 100 mg by mouth 3 (three) times daily. Takes at 0900,1700,1900 daily   05/05/2015 at Unknown time  . metolazone (ZAROXOLYN) 2.5 MG tablet Take 1.25-2.5 mg by mouth as needed (for edema and fluid). Take 30 mins before taking furosemide as needed   Past Week at Unknown time  . nitroGLYCERIN (NITROSTAT) 0.4 MG SL tablet Place 0.4 mg under the tongue every 5 (five) minutes as needed. For chest pain   not used  . potassium chloride SA (K-DUR,KLOR-CON) 20 MEQ tablet Take 40 mEq by mouth 2 (two) times daily.    05/05/2015 at Unknown time  . warfarin (COUMADIN) 2.5 MG tablet Take 2.5 mg by mouth daily at 6 PM.  05/04/2015 at Unknown time  . atenolol (TENORMIN) 50 MG tablet Take 50 mg by mouth daily.    Completed Course at 1000  . terbinafine (LAMISIL) 250 MG tablet Take 1 tablet (250 mg total) by mouth daily. 30 tablet 5 Taking    Assessment: 81 y.o. male admitted with fever/chills/cellulitis, h/o Afib, to continue Coumadin   Goal of Therapy:  INR 2-3 Monitor platelets by anticoagulation protocol: Yes   Plan:  Coumadin 5 mg today Daily INR  Agape Hardiman, Bronson Curb 05/06/2015,4:47 AM

## 2015-05-06 NOTE — Progress Notes (Signed)
ANTIBIOTIC CONSULT NOTE - INITIAL  Pharmacy Consult for Ceftriaxone Indication: Cellulitis  Allergies  Allergen Reactions  . Avelox [Moxifloxacin Hcl In Nacl] Other (See Comments)    Messed up lungs  . Indomethacin Other (See Comments)    Renal insufficiency  . Levaquin [Levofloxacin] Other (See Comments)    Messed up lungs  . Lipitor [Atorvastatin] Nausea Only, Palpitations and Other (See Comments)    Irregular heart beat also  . Pravachol [Pravastatin Sodium] Nausea Only, Palpitations and Other (See Comments)  . Aleve [Naproxen Sodium] Nausea And Vomiting and Other (See Comments)    GI Upset  . Norpace [Disopyramide Phosphate] Other (See Comments)    Urinary retention  . Amlodipine Besylate Rash  . Azithromycin Rash  . Bactrim [Sulfamethoxazole-Trimethoprim] Rash  . Clinoril [Sulindac] Rash  . Codeine Other (See Comments)    unknown  . Digoxin Rash  . Lorazepam Other (See Comments)    unknown  . Morphine And Related Other (See Comments)    unknown  . Other Other (See Comments)    Tripilenomenon? Per paperwork  . Percocet [Oxycodone-Acetaminophen] Rash  . Sulfonamide Derivatives Rash  . Uloric [Febuxostat] Other (See Comments)    unknown    Vital Signs: Temp: 98.4 F (36.9 C) (06/26 1954) Temp Source: Oral (06/26 1954) BP: 104/46 mmHg (06/26 2300) Pulse Rate: 65 (06/26 2300)  Labs:  Recent Labs  05/05/15 2019  WBC 12.5*  HGB 14.3  PLT 185  CREATININE 1.12   Medical History: Past Medical History  Diagnosis Date  . Hypertension   . CHF (congestive heart failure)   . Arrhythmia     afib  . Neuropathy   . Cancer     lymphoma  . Heart attack 1986  . ED (erectile dysfunction)   . Coronary artery disease   . Non Hodgkin's lymphoma   . PE (pulmonary embolism)   . GERD (gastroesophageal reflux disease)   . Hyperlipidemia   . Gout   . Peptic ulcer disease   . DJD (degenerative joint disease)   . PAC (premature atrial contraction)   . CVI (common  variable immunodeficiency)   . Venous stasis ulcers   . IFG (impaired fasting glucose)     Assessment: Ceftriaxone for cellulitis, pt with multiple drug allergies, WBC mildly elevated  Plan:  -Ceftriaxone 1g IV q24h -Trend WBC, temp, clinical status  Narda Bonds 05/06/2015,12:08 AM

## 2015-05-07 DIAGNOSIS — G9340 Encephalopathy, unspecified: Secondary | ICD-10-CM

## 2015-05-07 DIAGNOSIS — I482 Chronic atrial fibrillation: Secondary | ICD-10-CM

## 2015-05-07 LAB — PROTIME-INR
INR: 1.63 — AB (ref 0.00–1.49)
Prothrombin Time: 19.3 seconds — ABNORMAL HIGH (ref 11.6–15.2)

## 2015-05-07 LAB — CBC
HCT: 41.7 % (ref 39.0–52.0)
HEMOGLOBIN: 13.8 g/dL (ref 13.0–17.0)
MCH: 33.7 pg (ref 26.0–34.0)
MCHC: 33.1 g/dL (ref 30.0–36.0)
MCV: 101.7 fL — ABNORMAL HIGH (ref 78.0–100.0)
Platelets: 178 10*3/uL (ref 150–400)
RBC: 4.1 MIL/uL — AB (ref 4.22–5.81)
RDW: 14.3 % (ref 11.5–15.5)
WBC: 7.5 10*3/uL (ref 4.0–10.5)

## 2015-05-07 LAB — URINE CULTURE

## 2015-05-07 LAB — BASIC METABOLIC PANEL
ANION GAP: 5 (ref 5–15)
BUN: 17 mg/dL (ref 6–20)
CO2: 24 mmol/L (ref 22–32)
CREATININE: 1.12 mg/dL (ref 0.61–1.24)
Calcium: 8.9 mg/dL (ref 8.9–10.3)
Chloride: 106 mmol/L (ref 101–111)
GFR, EST NON AFRICAN AMERICAN: 56 mL/min — AB (ref >60)
Glucose, Bld: 98 mg/dL (ref 65–99)
Potassium: 4.4 mmol/L (ref 3.5–5.1)
Sodium: 135 mmol/L (ref 135–145)

## 2015-05-07 LAB — GLUCOSE, CAPILLARY
GLUCOSE-CAPILLARY: 108 mg/dL — AB (ref 65–99)
Glucose-Capillary: 93 mg/dL (ref 65–99)

## 2015-05-07 MED ORDER — LACTINEX PO CHEW: 1.0000 | CHEWABLE_TABLET | Freq: Three times a day (TID) | ORAL | Status: DC

## 2015-05-07 MED ORDER — FUROSEMIDE 80 MG PO TABS
80.0000 mg | ORAL_TABLET | Freq: Every day | ORAL | Status: DC
  Administered 2015-05-07: 80 mg via ORAL
  Filled 2015-05-07: qty 1

## 2015-05-07 MED ORDER — WARFARIN SODIUM 5 MG PO TABS
5.0000 mg | ORAL_TABLET | Freq: Once | ORAL | Status: DC
  Filled 2015-05-07: qty 1

## 2015-05-07 MED ORDER — POTASSIUM CHLORIDE CRYS ER 20 MEQ PO TBCR
40.0000 meq | EXTENDED_RELEASE_TABLET | Freq: Two times a day (BID) | ORAL | Status: DC
  Administered 2015-05-07: 40 meq via ORAL
  Filled 2015-05-07: qty 2

## 2015-05-07 MED ORDER — AMOXICILLIN-POT CLAVULANATE 875-125 MG PO TABS: 1.0000 | ORAL_TABLET | Freq: Two times a day (BID) | ORAL | Status: AC

## 2015-05-07 MED ORDER — METOLAZONE 2.5 MG PO TABS
1.2500 mg | ORAL_TABLET | ORAL | Status: DC | PRN
  Filled 2015-05-07: qty 1

## 2015-05-07 NOTE — Evaluation (Signed)
Physical Therapy Evaluation Patient Details Name: [name] MRN: [id number] DOB: 02-17-1923 Today's Date: 05/07/2015   History of Present Illness  81 y.o. male with Past medical history of hypertension, CHF, neuropathy, non-Hodgkin's lymphoma GERD, chronic venous stasis with discoloration of the skin and recurrent cellulitis .  Clinical Impression  Pt walked in halls with RW.  Pt talked while walking and O2 to 84% - educated on focusing on breathing with activity.  Pt likes to hold onto RW all the time for support.  Tested BERG and 24/56.  Educated pt on safety at home - sitting to don/doff shirts, etc.  Son and daughter in law present for education and will provide initial 24/7 care as pt needs.  Pt has equipment but would benefit from HHPT to work on balance and HEP.  Pt has had this in the past but doesn't continue the HEP.  Talked to him about this.  Pt and family feel pt is ready to go home.  Acute education complete.    Follow Up Recommendations Home health PT;Supervision/Assistance - 24 hour (pt is active with [facility] wtih nursing.  son to stay with pt for first few days home)    Equipment Recommendations  None recommended by PT    Recommendations for Other Services       Precautions / Restrictions Precautions Precautions: Fall Precaution Comments: pt reports he fell about a year ago.  He keeps RW with him at all times and is careful not to fall Restrictions Weight Bearing Restrictions: No      Mobility  Bed Mobility                  Transfers Overall transfer level: Modified independent Equipment used: Rolling walker (2 wheeled)             General transfer comment: pt stands and leans on chiar for support - he was not comfortable when asked to step forward  Ambulation/Gait Ambulation/Gait assistance: Min guard Ambulation Distance (Feet): 120 Feet Assistive device: Rolling walker (2 wheeled) Gait Pattern/deviations: Step-through pattern;Shuffle         Stairs            Wheelchair Mobility    Modified Rankin (Stroke Patients Only)       Balance Overall balance assessment: Needs assistance                               Standardized Balance Assessment Standardized Balance Assessment : Berg Balance Test Berg Balance Test Sit to Stand: Able to stand  independently using hands Standing Unsupported: Able to stand 2 minutes with supervision (pt tends to keep weight posterior on heels) Sitting with Back Unsupported but Feet Supported on Floor or Stool: Able to sit safely and securely 2 minutes Stand to Sit: Uses backs of legs against chair to control descent Transfers: Able to transfer safely, definite need of hands Standing Unsupported with Eyes Closed: Unable to keep eyes closed 3 seconds but stays steady (increased sway with eyes closed - esp posterior.  Educated on home safety with this) Standing Ubsupported with Feet Together: Needs help to attain position but able to stand for 30 seconds with feet together From Standing, Reach Forward with Outstretched Arm: Reaches forward but needs supervision From Standing Position, Pick up Object from Floor: Unable to pick up and needs supervision From Standing Position, Turn to Look Behind Over each Shoulder: Turn sideways only but maintains balance Turn  360 Degrees: Needs close supervision or verbal cueing Standing Unsupported, Alternately Place Feet on Step/Stool: Able to complete >2 steps/needs minimal assist Standing Unsupported, One Foot in Front: Needs help to step but can hold 15 seconds Standing on One Leg: Unable to try or needs assist to prevent fall Total Score: 24         Pertinent Vitals/Pain Pain Assessment: No/denies pain    Home Living Family/patient expects to be discharged to:: Private residence Living Arrangements: Alone Available Help at Discharge: Family (checks on him daily) Type of Home: House Home Access: Ramped entrance     Home Layout:  One level Home Equipment: Wheelchair - manual;Wheelchair - power;Walker - 2 wheels;Tub bench;Cane - single point      Prior Function Level of Independence: Independent with assistive device(s)         Comments: pt uses RW all the time in the house and takes power chair to the mailbox     Hand Dominance        Extremity/Trunk Assessment               Lower Extremity Assessment: Generalized weakness      Cervical / Trunk Assessment: Normal  Communication   Communication: No difficulties  Cognition Arousal/Alertness: Awake/alert Behavior During Therapy: WFL for tasks assessed/performed (talkative) Overall Cognitive Status: Within Functional Limits for tasks assessed                      General Comments      Exercises        Assessment/Plan    PT Assessment All further PT needs can be met in the next venue of care  PT Diagnosis Generalized weakness;Difficulty walking   PT Problem List Decreased activity tolerance;Decreased balance;Decreased safety awareness  PT Treatment Interventions     PT Goals (Current goals can be found in the Care Plan section) Acute Rehab PT Goals Patient Stated Goal: to get home and stay home and not fall and break anything (like his wife did) PT Goal Formulation: With patient/family Potential to Achieve Goals: Good    Frequency     Barriers to discharge        Co-evaluation               End of Session Equipment Utilized During Treatment: Gait belt Activity Tolerance: Patient tolerated treatment well Patient left: in chair;with family/visitor present (son and daughter in law present for eval and education) Nurse Communication: Mobility status         Time: 1315-1400 PT Time Calculation (min) (ACUTE ONLY): 45 min   Charges:   PT Evaluation $Initial PT Evaluation Tier I: 1 Procedure PT Treatments $Gait Training: 8-22 mins $Therapeutic Activity: 8-22 mins   PT G Codes:        [name] 05/07/2015, 2:13 PM 05/07/2015   [name], PT

## 2015-05-07 NOTE — Consult Note (Addendum)
WOC wound consult note Reason for Consult: Consult requested for bilat legs. Pt states he has chronic wounds and had home health assistance prior to the hospital stay.  Wound type: Left lower calf with full thickness stasis ulcer, 1.8X.2cm, 100% dark brown dry scabbed wound bed, no odor or drainage. Right lower calf with partial thickness stasis ulcer which is recent, according to patient; .2X.2X.1cm, pink and moist with small amt yellow drainage, no odor. Periwound: Bilat legs with generalized edema and dark purple skin color consistent with venous stasis changes. Dressing procedure/placement/frequency: Foam dressings to absorb drainage and protect from further injury.  Pt has compression stockings from home and states he will wear them to reduce edema.  He denies further questions at this time. Please re-consult if further assistance is needed.  Thank-you,  Julien Girt MSN, [place], [place], [place], [place]

## 2015-05-07 NOTE — Progress Notes (Signed)
ANTICOAGULATION CONSULT NOTE - [facility] for Coumadin Indication: atrial fibrillation  Allergies  Allergen Reactions  . Avelox [Moxifloxacin Hcl In Nacl] Other (See Comments)    Messed up lungs  . Indomethacin Other (See Comments)    Renal insufficiency  . Levaquin [Levofloxacin] Other (See Comments)    Messed up lungs  . Lipitor [Atorvastatin] Nausea Only, Palpitations and Other (See Comments)    Irregular heart beat also  . Pravachol [Pravastatin Sodium] Nausea Only, Palpitations and Other (See Comments)  . Aleve [Naproxen Sodium] Nausea And Vomiting and Other (See Comments)    GI Upset  . Norpace [Disopyramide Phosphate] Other (See Comments)    Urinary retention  . Amlodipine Besylate Rash  . Azithromycin Rash  . Bactrim [Sulfamethoxazole-Trimethoprim] Rash  . Clinoril [Sulindac] Rash  . Codeine Other (See Comments)    unknown  . Digoxin Rash  . Lorazepam Other (See Comments)    unknown  . Morphine And Related Other (See Comments)    unknown  . Other Other (See Comments)    Tripilenomenon? Per paperwork  . Percocet [Oxycodone-Acetaminophen] Rash  . Sulfonamide Derivatives Rash  . Uloric [Febuxostat] Other (See Comments)    unknown    Patient Measurements: Height: 5\' 4"  (162.6 cm) Weight: 192 lb 14.4 oz (87.5 kg) IBW/kg (Calculated) : 59.2  Vital Signs: Temp: 98.4 F (36.9 C) (06/28 0618) Temp Source: Oral (06/28 0618) BP: 158/74 mmHg (06/28 0618) Pulse Rate: 70 (06/28 0618)  Labs:  Recent Labs  05/05/15 2019 05/06/15 0250 05/07/15 0526  HGB 14.3 13.4 13.8  HCT 42.4 41.3 41.7  PLT 185 174 178  APTT  --  46*  --   LABPROT  --  20.9* 19.3*  INR  --  1.81* 1.63*  CREATININE 1.12 1.12 1.12    Estimated Creatinine Clearance: 43.7 mL/min (by C-G formula based on Cr of 1.12).   Medications:  Prescriptions prior to admission  Medication Sig Dispense Refill Last Dose  . acetaminophen (TYLENOL) 500 MG tablet Take 500 mg by  mouth at bedtime.    05/04/2015 at Unknown time  . allopurinol (ZYLOPRIM) 300 MG tablet Take 300 mg by mouth daily.    05/05/2015 at Unknown time  . ALPRAZolam (XANAX) 0.5 MG tablet Take 0.5 mg by mouth at bedtime. For sleep   05/04/2015 at Unknown time  . aspirin 81 MG tablet Take 81 mg by mouth every other day.    05/04/2015 at Unknown time  . Cholecalciferol 2000 UNITS CAPS Take 4,000 Units by mouth daily.   05/05/2015 at Unknown time  . colchicine 0.6 MG tablet Take 0.6 mg by mouth daily as needed (gout).    not used  . furosemide (LASIX) 80 MG tablet Take 80 mg by mouth daily.   05/05/2015 at Unknown time  . gabapentin (NEURONTIN) 100 MG capsule Take 100 mg by mouth 3 (three) times daily. Takes at 0900,1700,1900 daily   05/05/2015 at Unknown time  . metolazone (ZAROXOLYN) 2.5 MG tablet Take 1.25-2.5 mg by mouth as needed (for edema and fluid). Take 30 mins before taking furosemide as needed   Past Week at Unknown time  . nitroGLYCERIN (NITROSTAT) 0.4 MG SL tablet Place 0.4 mg under the tongue every 5 (five) minutes as needed. For chest pain   not used  . potassium chloride SA (K-DUR,KLOR-CON) 20 MEQ tablet Take 40 mEq by mouth 2 (two) times daily.    05/05/2015 at Unknown time  . warfarin (COUMADIN) 2.5 MG tablet Take  2.5 mg by mouth daily at 6 PM.    05/04/2015 at Unknown time  . atenolol (TENORMIN) 50 MG tablet Take 50 mg by mouth daily.    Completed Course at 1000  . terbinafine (LAMISIL) 250 MG tablet Take 1 tablet (250 mg total) by mouth daily. 30 tablet 5 Taking    Assessment: 81 y.o. male admitted with fever/chills/cellulitis and started on Rocephin.  Pt has h/o Afib on Coumadin PTA.  INR has been subtherapeutic since admission.   Goal of Therapy:  INR 2-3 Monitor platelets by anticoagulation protocol: Yes   Plan:  Repeat Coumadin 5 mg today Hopeful to restart home Coumadin dose tomorrow (2.5mg  daily) since pt will have received 2 x 5mg  boosted doses. Daily INR  [employer],  Pharm.D., BCPS Clinical Pharmacist Pager [phone] 05/07/2015 10:40 AM

## 2015-05-07 NOTE — Progress Notes (Signed)
[facility]  Patient Status: Active (receiving services up to time of hospitalization)  [facility] is providing the following services: RN for wound care to leg.  If patient discharges after hours, please call [phone].   [name] 05/07/2015, 8:59 AM

## 2015-05-07 NOTE — Discharge Summary (Signed)
[name], is a 81 y.o. male  DOB [birth year]  MRN [id number].  Admission date:  05/05/2015  Admitting Physician  Lavina Hamman, MD  Discharge Date:  05/07/2015   Primary MD  Irven Shelling, MD  Recommendations for primary care physician for things to follow:  - Check basic labs including CBC, BMP during next visit. - Continue with wound care which is done at home. - Continue with warfarin, INR is managed and monitored by PCP   Admission Diagnosis  Shortness of breath [R06.02]   Discharge Diagnosis  Shortness of breath [R06.02]    Principal Problem:   Cellulitis Active Problems:   Atrial fibrillation   Chronic diastolic heart failure   PPM-St.Jude   Coronary artery disease   Neuropathy   Generalized weakness   Hx of CABG   Acute encephalopathy      Past Medical History  Diagnosis Date  . Hypertension   . CHF (congestive heart failure)   . Arrhythmia     afib  . Neuropathy   . Cancer     lymphoma  . Heart attack 1986  . ED (erectile dysfunction)   . Coronary artery disease   . Non Hodgkin's lymphoma   . PE (pulmonary embolism)   . GERD (gastroesophageal reflux disease)   . Hyperlipidemia   . Gout   . Peptic ulcer disease   . DJD (degenerative joint disease)   . PAC (premature atrial contraction)   . CVI (common variable immunodeficiency)   . Venous stasis ulcers   . IFG (impaired fasting glucose)     Past Surgical History  Procedure Laterality Date  . Pacemaker insertion  08/2010  . Cardiac surgery    . Eye surgery  10/2006, 11/2006  . Back and knee surgeries  unknown  . Tonsillectomy  1972  . Nasal sinus surgery  1980  . Coronary artery bypass graft  1990  . Elbow surgery      r/t gout       History of present illness and  Hospital Course:     Kindly see H&P for history of present illness and admission details, please review complete Labs, Consult  reports and Test reports for all details in brief  HPI  from the history and physical done on the day of admission 6/27 by Dr Posey Pronto, [name] is a 81 y.o. male with Past medical history of hypertension, CHF, neuropathy, non-Hodgkin's lymphoma GERD, chronic venous stasis with discoloration of the skin and recurrent cellulitis . The patient is presenting with an episode of confusion. The patient was with his family today at a party for his 89th birthday. The patient after the event went for some rest and suddenly started having shaking chills. This was followed by having fever. Later on he started having confusion was having slurred speech as well. Patient was not oriented and therefore the family call EMS. EMS found the patient was hypotensive and febrile. His blood pressure significantly improved since his arrival. His mentation is also  his baseline but he continues to have some speech difficulties which he thinks is secondary to dry mouth on the family does not think that is his baseline.  The patient is coming from home.  At his baseline ambulates with walker And is independent for most of his ADL manages her medication on his own.    Hospital Course   81 y.o. male with Past medical history of hypertension, CHF, neuropathy, non-Hodgkin's lymphoma GERD, chronic venous stasis with discoloration of the skin and recurrent cellulitis . Presents with complaints of altered mental status, has no leukocytosis, CT head without acute findings, has mild leukocytosis, and evidence of cellulitis.  Cellulitis - Presents with generalized weakness and fatigue, has lactic acidosis, and mild leukocytosis. - treated with ceftriaxone for treatment for his cellulitis as it has been effective in the past infections. We'll discharge on 5 days total of oral Augmentin - blood cultures no growth  Acute encephalopathy - Resolved, no acute finding in CT head, related to cellulitis.  Chronic  diastolic CHF - Resume Lasix and Zaroxolyn    Coronary artery disease status post CABG - Denies any chest pain or shortness of breath.  Atrial fibrillation - Rate controlled, on warfarin,    Discharge Condition: stable   Follow UP  Follow-up Information    Follow up with Irven Shelling, MD. Schedule an appointment as soon as possible for a visit in 1 week.   Specialty:  Internal Medicine   Why:  post hospitalization follow up.   Contact information:   [address] [place] [address] [phone]         Discharge Instructions  and  Discharge Medications     Discharge Instructions    Diet - low sodium heart healthy    Complete by:  As directed      Discharge instructions    Complete by:  As directed   Follow with Primary MD Irven Shelling, MD in 7 days   Get CBC, CMP, 2 view Chest X ray checked  by Primary MD next visit.    Activity: As tolerated with Full fall precautions use walker/cane & assistance as needed   Disposition Home **   Diet: Heart Healthy ** , with feeding assistance and aspiration precautions.  For Heart failure patients - Check your Weight same time everyday, if you gain over 2 pounds, or you develop in leg swelling, experience more shortness of breath or chest pain, call your Primary MD immediately. Follow Cardiac Low Salt Diet and 1.5 lit/day fluid restriction.   On your next visit with your primary care physician please Get Medicines reviewed and adjusted.   Please request your Prim.MD to go over all Hospital Tests and Procedure/Radiological results at the follow up, please get all Hospital records sent to your Prim MD by signing hospital release before you go home.   If you experience worsening of your admission symptoms, develop shortness of breath, life threatening emergency, suicidal or homicidal thoughts you must seek medical attention immediately by calling 911 or calling your MD immediately  if symptoms  less severe.  You Must read complete instructions/literature along with all the possible adverse reactions/side effects for all the Medicines you take and that have been prescribed to you. Take any new Medicines after you have completely understood and accpet all the possible adverse reactions/side effects.   Do not drive, operating heavy machinery, perform activities at heights, swimming or participation in water activities or provide baby sitting services if your  were admitted for syncope or siezures until you have seen by Primary MD or a Neurologist and advised to do so again.  Do not drive when taking Pain medications.    Do not take more than prescribed Pain, Sleep and Anxiety Medications  Special Instructions: If you have smoked or chewed Tobacco  in the last 2 yrs please stop smoking, stop any regular Alcohol  and or any Recreational drug use.  Wear Seat belts while driving.   Please note  You were cared for by a hospitalist during your hospital stay. If you have any questions about your discharge medications or the care you received while you were in the hospital after you are discharged, you can call the unit and asked to speak with the hospitalist on call if the hospitalist that took care of you is not available. Once you are discharged, your primary care physician will handle any further medical issues. Please note that NO REFILLS for any discharge medications will be authorized once you are discharged, as it is imperative that you return to your primary care physician (or establish a relationship with a primary care physician if you do not have one) for your aftercare needs so that they can reassess your need for medications and monitor your lab values.     Increase activity slowly    Complete by:  As directed             Medication List    TAKE these medications        acetaminophen 500 MG tablet  Commonly known as:  TYLENOL  Take 500 mg by mouth at bedtime.      allopurinol 300 MG tablet  Commonly known as:  ZYLOPRIM  Take 300 mg by mouth daily.     ALPRAZolam 0.5 MG tablet  Commonly known as:  XANAX  Take 0.5 mg by mouth at bedtime. For sleep     amoxicillin-clavulanate 875-125 MG per tablet  Commonly known as:  AUGMENTIN  Take 1 tablet by mouth 2 (two) times daily.     aspirin 81 MG tablet  Take 81 mg by mouth every other day.     atenolol 50 MG tablet  Commonly known as:  TENORMIN  Take 50 mg by mouth daily.     Cholecalciferol 2000 UNITS Caps  Take 4,000 Units by mouth daily.     colchicine 0.6 MG tablet  Take 0.6 mg by mouth daily as needed (gout).     furosemide 80 MG tablet  Commonly known as:  LASIX  Take 80 mg by mouth daily.     gabapentin 100 MG capsule  Commonly known as:  NEURONTIN  Take 100 mg by mouth 3 (three) times daily. Takes at 0900,1700,1900 daily     lactobacillus acidophilus & bulgar chewable tablet  Chew 1 tablet by mouth 3 (three) times daily with meals.     metolazone 2.5 MG tablet  Commonly known as:  ZAROXOLYN  Take 1.25-2.5 mg by mouth as needed (for edema and fluid). Take 30 mins before taking furosemide as needed     nitroGLYCERIN 0.4 MG SL tablet  Commonly known as:  NITROSTAT  Place 0.4 mg under the tongue every 5 (five) minutes as needed. For chest pain     potassium chloride SA 20 MEQ tablet  Commonly known as:  K-DUR,KLOR-CON  Take 40 mEq by mouth 2 (two) times daily.     terbinafine 250 MG tablet  Commonly known as:  LAMISIL  Take  1 tablet (250 mg total) by mouth daily.     warfarin 2.5 MG tablet  Commonly known as:  COUMADIN  Take 2.5 mg by mouth daily at 6 PM.          Diet and Activity recommendation: See Discharge Instructions above   Consults obtained -  none   Major procedures and Radiology Reports - PLEASE review detailed and final reports for all details, in brief -      Dg Chest 2 View  05/05/2015   CLINICAL DATA:  Shortness of breath, hypertension.  EXAM:  CHEST  2 VIEW  COMPARISON:  11/29/2011  FINDINGS: Dual lead left-sided pacemaker remains in place. Patient is post median sternotomy. Lung volumes are low. Mild cardiomegaly, unchanged from prior. Central peribronchial cuffing, unchanged. No confluent airspace disease. Mild vascular congestion, no frank pulmonary edema. No pleural effusion or pneumothorax. Compression deformity in the lower thoracic spine, unchanged.  IMPRESSION: Mild vascular congestion without frank pulmonary edema. Cardiomegaly, grossly stable.   Electronically Signed   By: Jeb Levering M.D.   On: 05/05/2015 21:16   Ct Head Wo Contrast  05/06/2015   CLINICAL DATA:  Weakness.  Speech abnormality.  EXAM: CT HEAD WITHOUT CONTRAST  TECHNIQUE: Contiguous axial images were obtained from the base of the skull through the vertex without intravenous contrast.  COMPARISON:  08/31/2010  FINDINGS: There is no intracranial hemorrhage, mass or evidence of acute infarction. There is moderately severe generalized atrophy. There is white matter hypodensity consistent with chronic small vessel ischemic disease, and this is mildly worsened from 2011. No acute intracranial findings are evident. No bony abnormality is evident.  IMPRESSION: Generalized atrophy and chronic microvascular ischemic disease. No acute intracranial findings.   Electronically Signed   By: Andreas Newport M.D.   On: 05/06/2015 05:00    Micro Results     Recent Results (from the past 240 hour(s))  Blood culture (routine x 2)     Status: None (Preliminary result)   Collection Time: 05/05/15  8:15 PM  Result Value Ref Range Status   Specimen Description BLOOD RIGHT ANTECUBITAL  Final   Special Requests BOTTLES DRAWN AEROBIC AND ANAEROBIC 5CC EA  Final   Culture NO GROWTH < 24 HOURS  Final   Report Status PENDING  Incomplete  Blood culture (routine x 2)     Status: None (Preliminary result)   Collection Time: 05/05/15  8:19 PM  Result Value Ref Range Status   Specimen  Description BLOOD RIGHT FOREARM  Final   Special Requests BOTTLES DRAWN AEROBIC AND ANAEROBIC 5CC EA  Final   Culture NO GROWTH < 24 HOURS  Final   Report Status PENDING  Incomplete  Urine culture     Status: None   Collection Time: 05/05/15  8:35 PM  Result Value Ref Range Status   Specimen Description URINE, CLEAN CATCH  Final   Special Requests NONE  Final   Culture 5,000 COLONIES/mL INSIGNIFICANT GROWTH  Final   Report Status 05/07/2015 FINAL  Final       Today   Subjective:   Antoinette Haskett today has no headache,no chest abdominal pain,no new weakness tingling or numbness, feels much better wants to go home today.   Objective:   Blood pressure 155/74, pulse 74, temperature 97.9 F (36.6 C), temperature source Oral, resp. rate 18, height 5\' 4"  (1.626 m), weight 87.5 kg (192 lb 14.4 oz), SpO2 97 %.   Intake/Output Summary (Last 24 hours) at 05/07/15 1217 Last data filed  at 05/07/15 1030  Gross per 24 hour  Intake    820 ml  Output    525 ml  Net    295 ml    Exam  Awake Alert, Oriented X 3, No new F.N deficits, Normal affect [place].AT,PERRAL Supple Neck,No JVD, No cervical lymphadenopathy appriciated.  Symmetrical Chest wall movement, Good air movement bilaterally, CTAB ,No Gallops,Rubs or new Murmurs, No Parasternal Heave +ve B.Sounds, Abd Soft, No tenderness, No organomegaly appriciated, No rebound - guarding or rigidity. No Cyanosis, Club, mild pedal edema, chronic venous status changes, with chronic lower extremity wounds. Data Review   CBC w Diff: Lab Results  Component Value Date   WBC 7.5 05/07/2015   WBC 12.9* 05/18/2011   WBC 7.6 04/30/2008   HGB 13.8 05/07/2015   HGB 14.9 05/18/2011   HGB 17.0 04/30/2008   HCT 41.7 05/07/2015   HCT 42.6 05/18/2011   HCT 48.0 04/30/2008   PLT 178 05/07/2015   PLT 188 05/18/2011   PLT 193 04/30/2008   LYMPHOPCT 31 05/06/2015   LYMPHOPCT 21.3 05/18/2011   LYMPHOPCT 43.3 04/30/2008   MONOPCT 12 05/06/2015    MONOPCT 11.1 05/18/2011   MONOPCT 9.8 04/30/2008   EOSPCT 1 05/06/2015   EOSPCT 1.4 05/18/2011   EOSPCT 3.9 04/30/2008   BASOPCT 0 05/06/2015   BASOPCT 0.2 05/18/2011   BASOPCT 1.1 04/30/2008    CMP: Lab Results  Component Value Date   NA 135 05/07/2015   K 4.4 05/07/2015   CL 106 05/07/2015   CO2 24 05/07/2015   BUN 17 05/07/2015   CREATININE 1.12 05/07/2015   CREATININE 1.09 10/19/2013   PROT 6.0* 05/06/2015   ALBUMIN 2.7* 05/06/2015   BILITOT 1.0 05/06/2015   ALKPHOS 54 05/06/2015   AST 23 05/06/2015   ALT 12* 05/06/2015  .   Total Time in preparing paper work, data evaluation and todays exam - 35 minutes  ELGERGAWY, DAWOOD M.D on 05/07/2015 at 12:17 PM  [facility]   Office  [phone]

## 2015-05-07 NOTE — Care Management Note (Signed)
Case Management Note  Patient Details  Name: [name] MRN: [id number] Date of Birth: August 03, 1923  Subjective/Objective:    Admitted with cellulitis                Action/Plan: Patient active with [facility] as prior to admission  Expected Discharge Date:   05/07/2015               Expected Discharge Plan:  [place]    Discharge planning Services  CM Consult   Choice offered to:  Patient  HH Arranged:  RN [facility] Agency:  [place]  Status of Service:  Completed, signed off  Sherrilyn Rist [phone] 05/07/2015, 2:52 PM

## 2015-05-10 LAB — CULTURE, BLOOD (ROUTINE X 2)
CULTURE: NO GROWTH
Culture: NO GROWTH

## 2015-07-07 ENCOUNTER — Emergency Department (HOSPITAL_COMMUNITY): Payer: Medicare Other

## 2015-07-07 ENCOUNTER — Inpatient Hospital Stay (HOSPITAL_COMMUNITY)
Admission: EM | Admit: 2015-07-07 | Discharge: 2015-07-09 | DRG: 872 | Disposition: A | Discharge status: Home Health | Payer: Medicare Other | Attending: Internal Medicine | Admitting: Internal Medicine

## 2015-07-07 ENCOUNTER — Encounter (HOSPITAL_COMMUNITY): Payer: Self-pay | Admitting: *Deleted

## 2015-07-07 DIAGNOSIS — Z881 Allergy status to other antibiotic agents status: Secondary | ICD-10-CM

## 2015-07-07 DIAGNOSIS — Z951 Presence of aortocoronary bypass graft: Secondary | ICD-10-CM | POA: Diagnosis not present

## 2015-07-07 DIAGNOSIS — Z950 Presence of cardiac pacemaker: Secondary | ICD-10-CM | POA: Diagnosis not present

## 2015-07-07 DIAGNOSIS — Z7982 Long term (current) use of aspirin: Secondary | ICD-10-CM

## 2015-07-07 DIAGNOSIS — M1990 Unspecified osteoarthritis, unspecified site: Secondary | ICD-10-CM | POA: Diagnosis present

## 2015-07-07 DIAGNOSIS — I482 Chronic atrial fibrillation, unspecified: Secondary | ICD-10-CM | POA: Diagnosis present

## 2015-07-07 DIAGNOSIS — I4891 Unspecified atrial fibrillation: Secondary | ICD-10-CM | POA: Diagnosis present

## 2015-07-07 DIAGNOSIS — I129 Hypertensive chronic kidney disease with stage 1 through stage 4 chronic kidney disease, or unspecified chronic kidney disease: Secondary | ICD-10-CM | POA: Diagnosis present

## 2015-07-07 DIAGNOSIS — Z888 Allergy status to other drugs, medicaments and biological substances status: Secondary | ICD-10-CM | POA: Diagnosis not present

## 2015-07-07 DIAGNOSIS — L03115 Cellulitis of right lower limb: Secondary | ICD-10-CM

## 2015-07-07 DIAGNOSIS — I5042 Chronic combined systolic (congestive) and diastolic (congestive) heart failure: Secondary | ICD-10-CM | POA: Diagnosis present

## 2015-07-07 DIAGNOSIS — L03119 Cellulitis of unspecified part of limb: Secondary | ICD-10-CM

## 2015-07-07 DIAGNOSIS — Z7901 Long term (current) use of anticoagulants: Secondary | ICD-10-CM | POA: Diagnosis not present

## 2015-07-07 DIAGNOSIS — Z86711 Personal history of pulmonary embolism: Secondary | ICD-10-CM

## 2015-07-07 DIAGNOSIS — I2510 Atherosclerotic heart disease of native coronary artery without angina pectoris: Secondary | ICD-10-CM | POA: Diagnosis present

## 2015-07-07 DIAGNOSIS — I878 Other specified disorders of veins: Secondary | ICD-10-CM | POA: Diagnosis present

## 2015-07-07 DIAGNOSIS — L03116 Cellulitis of left lower limb: Secondary | ICD-10-CM | POA: Diagnosis present

## 2015-07-07 DIAGNOSIS — A419 Sepsis, unspecified organism: Principal | ICD-10-CM | POA: Diagnosis present

## 2015-07-07 DIAGNOSIS — Z8572 Personal history of non-Hodgkin lymphomas: Secondary | ICD-10-CM

## 2015-07-07 DIAGNOSIS — M109 Gout, unspecified: Secondary | ICD-10-CM | POA: Diagnosis present

## 2015-07-07 DIAGNOSIS — N183 Chronic kidney disease, stage 3 (moderate): Secondary | ICD-10-CM | POA: Diagnosis present

## 2015-07-07 DIAGNOSIS — Z886 Allergy status to analgesic agent status: Secondary | ICD-10-CM | POA: Diagnosis not present

## 2015-07-07 DIAGNOSIS — Z87891 Personal history of nicotine dependence: Secondary | ICD-10-CM

## 2015-07-07 DIAGNOSIS — Z79899 Other long term (current) drug therapy: Secondary | ICD-10-CM

## 2015-07-07 DIAGNOSIS — Z882 Allergy status to sulfonamides status: Secondary | ICD-10-CM | POA: Diagnosis not present

## 2015-07-07 DIAGNOSIS — E785 Hyperlipidemia, unspecified: Secondary | ICD-10-CM | POA: Diagnosis present

## 2015-07-07 DIAGNOSIS — I872 Venous insufficiency (chronic) (peripheral): Secondary | ICD-10-CM

## 2015-07-07 DIAGNOSIS — A4189 Other specified sepsis: Secondary | ICD-10-CM | POA: Diagnosis not present

## 2015-07-07 DIAGNOSIS — R531 Weakness: Secondary | ICD-10-CM

## 2015-07-07 DIAGNOSIS — L0390 Cellulitis, unspecified: Secondary | ICD-10-CM | POA: Diagnosis present

## 2015-07-07 DIAGNOSIS — I252 Old myocardial infarction: Secondary | ICD-10-CM

## 2015-07-07 LAB — CBC WITH DIFFERENTIAL/PLATELET
BASOS ABS: 0 10*3/uL (ref 0.0–0.1)
Basophils Absolute: 0 10*3/uL (ref 0.0–0.1)
Basophils Relative: 0 % (ref 0–1)
Basophils Relative: 0 % (ref 0–1)
EOS ABS: 0 10*3/uL (ref 0.0–0.7)
EOS PCT: 0 % (ref 0–5)
Eosinophils Absolute: 0 10*3/uL (ref 0.0–0.7)
Eosinophils Relative: 0 % (ref 0–5)
HCT: 47.3 % (ref 39.0–52.0)
HEMATOCRIT: 44.8 % (ref 39.0–52.0)
HEMOGLOBIN: 15.6 g/dL (ref 13.0–17.0)
HEMOGLOBIN: 14.4 g/dL (ref 13.0–17.0)
LYMPHS PCT: 7 % — AB (ref 12–46)
LYMPHS PCT: 14 % (ref 12–46)
Lymphs Abs: 1.4 10*3/uL (ref 0.7–4.0)
Lymphs Abs: 2.6 10*3/uL (ref 0.7–4.0)
MCH: 34.1 pg — AB (ref 26.0–34.0)
MCH: 33.4 pg (ref 26.0–34.0)
MCHC: 33 g/dL (ref 30.0–36.0)
MCHC: 32.1 g/dL (ref 30.0–36.0)
MCV: 103.5 fL — AB (ref 78.0–100.0)
MCV: 103.9 fL — AB (ref 78.0–100.0)
MONOS PCT: 6 % (ref 3–12)
Monocytes Absolute: 1.2 10*3/uL — ABNORMAL HIGH (ref 0.1–1.0)
Monocytes Absolute: 1.3 10*3/uL — ABNORMAL HIGH (ref 0.1–1.0)
Monocytes Relative: 7 % (ref 3–12)
NEUTROS ABS: 15.4 10*3/uL — AB (ref 1.7–7.7)
NEUTROS PCT: 87 % — AB (ref 43–77)
Neutro Abs: 16.2 10*3/uL — ABNORMAL HIGH (ref 1.7–7.7)
Neutrophils Relative %: 79 % — ABNORMAL HIGH (ref 43–77)
Platelets: 190 10*3/uL (ref 150–400)
Platelets: 184 10*3/uL (ref 150–400)
RBC: 4.57 MIL/uL (ref 4.22–5.81)
RBC: 4.31 MIL/uL (ref 4.22–5.81)
RDW: 14.4 % (ref 11.5–15.5)
RDW: 14.4 % (ref 11.5–15.5)
WBC: 18.8 10*3/uL — AB (ref 4.0–10.5)
WBC: 19.4 10*3/uL — AB (ref 4.0–10.5)

## 2015-07-07 LAB — COMPREHENSIVE METABOLIC PANEL
ALT: 16 U/L — ABNORMAL LOW (ref 17–63)
ANION GAP: 7 (ref 5–15)
AST: 26 U/L (ref 15–41)
Albumin: 3.7 g/dL (ref 3.5–5.0)
Alkaline Phosphatase: 64 U/L (ref 38–126)
BUN: 19 mg/dL (ref 6–20)
CALCIUM: 9.5 mg/dL (ref 8.9–10.3)
CO2: 29 mmol/L (ref 22–32)
Chloride: 101 mmol/L (ref 101–111)
Creatinine, Ser: 1.24 mg/dL (ref 0.61–1.24)
GFR calc non Af Amer: 49 mL/min — ABNORMAL LOW (ref >60)
GFR, EST AFRICAN AMERICAN: 57 mL/min — AB (ref >60)
Glucose, Bld: 122 mg/dL — ABNORMAL HIGH (ref 65–99)
Potassium: 4.2 mmol/L (ref 3.5–5.1)
Sodium: 137 mmol/L (ref 135–145)
Total Bilirubin: 1.1 mg/dL (ref 0.3–1.2)
Total Protein: 7.5 g/dL (ref 6.5–8.1)

## 2015-07-07 LAB — PROTIME-INR
INR: 1.86 — ABNORMAL HIGH (ref 0.00–1.49)
Prothrombin Time: 21.4 s — ABNORMAL HIGH (ref 11.6–15.2)

## 2015-07-07 LAB — LACTIC ACID, PLASMA
Lactic Acid, Venous: 1.8 mmol/L (ref 0.5–2.0)
Lactic Acid, Venous: 1.7 mmol/L (ref 0.5–2.0)

## 2015-07-07 LAB — APTT: aPTT: 38 seconds — ABNORMAL HIGH (ref 24–37)

## 2015-07-07 LAB — GLUCOSE, CAPILLARY: Glucose-Capillary: 108 mg/dL — ABNORMAL HIGH (ref 65–99)

## 2015-07-07 MED ORDER — ONDANSETRON HCL 4 MG/2ML IJ SOLN: 4.0000 mg | Freq: Four times a day (QID) | INTRAMUSCULAR | Status: DC | PRN

## 2015-07-07 MED ORDER — POTASSIUM CHLORIDE CRYS ER 20 MEQ PO TBCR
20.0000 meq | EXTENDED_RELEASE_TABLET | Freq: Three times a day (TID) | ORAL | Status: DC
  Administered 2015-07-07 – 2015-07-09 (×7): 20 meq via ORAL
  Filled 2015-07-07 (×10): qty 1

## 2015-07-07 MED ORDER — CEPHALEXIN 500 MG PO CAPS: 500.0000 mg | ORAL_CAPSULE | Freq: Four times a day (QID) | ORAL | Status: DC

## 2015-07-07 MED ORDER — ALLOPURINOL 300 MG PO TABS
300.0000 mg | ORAL_TABLET | Freq: Every day | ORAL | Status: DC
  Administered 2015-07-07 – 2015-07-09 (×3): 300 mg via ORAL
  Filled 2015-07-07 (×3): qty 1

## 2015-07-07 MED ORDER — WARFARIN SODIUM 5 MG PO TABS
5.0000 mg | ORAL_TABLET | Freq: Once | ORAL | Status: DC
  Filled 2015-07-07: qty 1

## 2015-07-07 MED ORDER — GABAPENTIN 100 MG PO CAPS
100.0000 mg | ORAL_CAPSULE | Freq: Three times a day (TID) | ORAL | Status: DC
  Administered 2015-07-07 – 2015-07-09 (×6): 100 mg via ORAL
  Filled 2015-07-07 (×8): qty 1

## 2015-07-07 MED ORDER — ALUM & MAG HYDROXIDE-SIMETH 200-200-20 MG/5ML PO SUSP: 30.0000 mL | Freq: Four times a day (QID) | ORAL | Status: DC | PRN

## 2015-07-07 MED ORDER — SODIUM CHLORIDE 0.9 % IV SOLN
250.0000 mL | INTRAVENOUS | Status: DC | PRN
  Administered 2015-07-07: 250 mL via INTRAVENOUS

## 2015-07-07 MED ORDER — SODIUM CHLORIDE 0.9 % IJ SOLN
3.0000 mL | Freq: Two times a day (BID) | INTRAMUSCULAR | Status: DC
  Administered 2015-07-07 – 2015-07-08 (×2): 3 mL via INTRAVENOUS

## 2015-07-07 MED ORDER — ATENOLOL 50 MG PO TABS
50.0000 mg | ORAL_TABLET | Freq: Every day | ORAL | Status: DC
  Administered 2015-07-07 – 2015-07-09 (×3): 50 mg via ORAL
  Filled 2015-07-07 (×3): qty 1

## 2015-07-07 MED ORDER — ACETAMINOPHEN 650 MG RE SUPP: 650.0000 mg | Freq: Four times a day (QID) | RECTAL | Status: DC | PRN

## 2015-07-07 MED ORDER — VANCOMYCIN HCL IN DEXTROSE 1-5 GM/200ML-% IV SOLN: 1000.0000 mg | Freq: Once | INTRAVENOUS | Status: DC

## 2015-07-07 MED ORDER — VANCOMYCIN HCL 10 G IV SOLR
1500.0000 mg | INTRAVENOUS | Status: DC
  Administered 2015-07-07 – 2015-07-08 (×2): 1500 mg via INTRAVENOUS
  Filled 2015-07-07 (×3): qty 1500

## 2015-07-07 MED ORDER — ALPRAZOLAM 0.5 MG PO TABS
0.5000 mg | ORAL_TABLET | Freq: Every day | ORAL | Status: DC
  Administered 2015-07-07 – 2015-07-08 (×2): 0.5 mg via ORAL
  Filled 2015-07-07 (×2): qty 1

## 2015-07-07 MED ORDER — SODIUM CHLORIDE 0.9 % IJ SOLN: 3.0000 mL | INTRAMUSCULAR | Status: DC | PRN

## 2015-07-07 MED ORDER — VITAMIN D3 25 MCG (1000 UNIT) PO TABS
4000.0000 [IU] | ORAL_TABLET | Freq: Every day | ORAL | Status: DC
  Administered 2015-07-07 – 2015-07-09 (×3): 4000 [IU] via ORAL
  Filled 2015-07-07 (×3): qty 4

## 2015-07-07 MED ORDER — ONDANSETRON HCL 4 MG PO TABS: 4.0000 mg | ORAL_TABLET | Freq: Four times a day (QID) | ORAL | Status: DC | PRN

## 2015-07-07 MED ORDER — COLCHICINE 0.6 MG PO TABS
0.6000 mg | ORAL_TABLET | Freq: Every day | ORAL | Status: DC | PRN
  Administered 2015-07-07: 0.6 mg via ORAL
  Filled 2015-07-07 (×2): qty 1

## 2015-07-07 MED ORDER — ASPIRIN EC 81 MG PO TBEC
81.0000 mg | DELAYED_RELEASE_TABLET | ORAL | Status: DC
Start: 2015-07-08 — End: 2015-07-09
  Administered 2015-07-08: 81 mg via ORAL
  Filled 2015-07-07: qty 1

## 2015-07-07 MED ORDER — FUROSEMIDE 80 MG PO TABS
80.0000 mg | ORAL_TABLET | Freq: Every day | ORAL | Status: DC
  Administered 2015-07-08 – 2015-07-09 (×2): 80 mg via ORAL
  Filled 2015-07-07 (×2): qty 1

## 2015-07-07 MED ORDER — WARFARIN - PHARMACIST DOSING INPATIENT: Freq: Every day | Status: DC

## 2015-07-07 MED ORDER — WARFARIN SODIUM 4 MG PO TABS
4.0000 mg | ORAL_TABLET | Freq: Once | ORAL | Status: AC
  Filled 2015-07-07: qty 1

## 2015-07-07 MED ORDER — ACETAMINOPHEN 325 MG PO TABS: 650.0000 mg | ORAL_TABLET | Freq: Four times a day (QID) | ORAL | Status: DC | PRN

## 2015-07-07 MED ORDER — SENNOSIDES-DOCUSATE SODIUM 8.6-50 MG PO TABS: 1.0000 | ORAL_TABLET | Freq: Every evening | ORAL | Status: DC | PRN

## 2015-07-07 NOTE — ED Notes (Signed)
Bed: OE78 Expected date:  Expected time:  Means of arrival:  Comments: EMS ?cellulitis of legs?

## 2015-07-07 NOTE — ED Notes (Signed)
Main lab notified of need for blood draw.  RN unable to draw from IV

## 2015-07-07 NOTE — ED Notes (Signed)
Pt weak and achy for 2 days.  Swollen red legs which is not "entirely new" per EMS.  No ulcers, legs are Weeping and pt is in pain.  Temp 99.3

## 2015-07-07 NOTE — Progress Notes (Addendum)
ANTICOAGULATION CONSULT NOTE - Initial Consult  Pharmacy Consult for Warfarin Indication: atrial fibrillation  Allergies  Allergen Reactions  . Avelox [Moxifloxacin Hcl In Nacl] Other (See Comments)    Messed up lungs  . Indomethacin Other (See Comments)    Renal insufficiency  . Levaquin [Levofloxacin] Other (See Comments)    Messed up lungs  . Lipitor [Atorvastatin] Nausea Only, Palpitations and Other (See Comments)    Irregular heart beat also  . Pravachol [Pravastatin Sodium] Nausea Only, Palpitations and Other (See Comments)  . Aleve [Naproxen Sodium] Nausea And Vomiting and Other (See Comments)    GI Upset  . Norpace [Disopyramide Phosphate] Other (See Comments)    Urinary retention  . Amlodipine Besylate Rash  . Azithromycin Rash  . Bactrim [Sulfamethoxazole-Trimethoprim] Rash  . Clinoril [Sulindac] Rash  . Codeine Other (See Comments)    unknown  . Digoxin Rash  . Lorazepam Other (See Comments)    unknown  . Morphine And Related Other (See Comments)    unknown  . Other Other (See Comments)    Tripilenomenon? Per paperwork  . Percocet [Oxycodone-Acetaminophen] Rash  . Sulfonamide Derivatives Rash  . Uloric [Febuxostat] Other (See Comments)    unknown    Patient Measurements:   Vital Signs: Temp: 101.5 F (38.6 C) (08/28 1131) Temp Source: Rectal (08/28 1131) BP: 132/64 mmHg (08/28 1131) Pulse Rate: 71 (08/28 1131)  Labs:  Recent Labs  07/07/15 0934  HGB 15.6  HCT 47.3  PLT 190  CREATININE 1.24    CrCl cannot be calculated (Unknown ideal weight.).   Medical History: Past Medical History  Diagnosis Date  . Hypertension   . CHF (congestive heart failure)   . Arrhythmia     afib  . Neuropathy   . Cancer     lymphoma  . Heart attack 1986  . ED (erectile dysfunction)   . Coronary artery disease   . Non Hodgkin's lymphoma   . PE (pulmonary embolism)   . GERD (gastroesophageal reflux disease)   . Hyperlipidemia   . Gout   . Peptic ulcer  disease   . DJD (degenerative joint disease)   . PAC (premature atrial contraction)   . CVI (common variable immunodeficiency)   . Venous stasis ulcers   . IFG (impaired fasting glucose)     Medications:  Scheduled:  . allopurinol  300 mg Oral Daily  . ALPRAZolam  0.5 mg Oral QHS  . [START ON 07/08/2015] aspirin EC  81 mg Oral QODAY  . atenolol  50 mg Oral Daily  . cholecalciferol  4,000 Units Oral Daily  . furosemide  80 mg Oral Daily  . gabapentin  100 mg Oral TID  . potassium chloride SA  20 mEq Oral TID   Infusions:  . vancomycin     PRN: colchicine  Assessment: 81 yo [name] on chronic warfarin for afib with pacemake who presents to ED from home with weakness. He is being admitted with sepsis, source possibly LE cellulitis. Pharmacy is consulted to continue warfarin dosing on admission.  Home dose reported as 2.5mg  daily, last taken 8/27  INR subtherapeutic at 1.86  H/H wnl, Plts at low end but near recent baseline  Concurrent aspirin 81mg  daily noted  Goal of Therapy:  INR 2-3   Plan:   Warfarin 4mg  PO x 1 today at 18:00  Daily PT/INR  Peggyann Juba, PharmD, BCPS Pager: [phone] 07/07/2015,12:30 PM

## 2015-07-07 NOTE — Progress Notes (Signed)
ANTIBIOTIC CONSULT NOTE - INITIAL  Pharmacy Consult for Vancomycin Indication: cellulitis  Allergies  Allergen Reactions  . Avelox [Moxifloxacin Hcl In Nacl] Other (See Comments)    Messed up lungs  . Indomethacin Other (See Comments)    Renal insufficiency  . Levaquin [Levofloxacin] Other (See Comments)    Messed up lungs  . Lipitor [Atorvastatin] Nausea Only, Palpitations and Other (See Comments)    Irregular heart beat also  . Pravachol [Pravastatin Sodium] Nausea Only, Palpitations and Other (See Comments)  . Aleve [Naproxen Sodium] Nausea And Vomiting and Other (See Comments)    GI Upset  . Norpace [Disopyramide Phosphate] Other (See Comments)    Urinary retention  . Amlodipine Besylate Rash  . Azithromycin Rash  . Bactrim [Sulfamethoxazole-Trimethoprim] Rash  . Clinoril [Sulindac] Rash  . Codeine Other (See Comments)    unknown  . Digoxin Rash  . Lorazepam Other (See Comments)    unknown  . Morphine And Related Other (See Comments)    unknown  . Other Other (See Comments)    Tripilenomenon? Per paperwork  . Percocet [Oxycodone-Acetaminophen] Rash  . Sulfonamide Derivatives Rash  . Uloric [Febuxostat] Other (See Comments)    unknown    Patient Measurements:   Height: 64 inches Weight: 85kg (estimate)  Vital Signs: Temp: 101.5 F (38.6 C) (08/28 1131) Temp Source: Rectal (08/28 1131) BP: 132/64 mmHg (08/28 1131) Pulse Rate: 71 (08/28 1131) Intake/Output from previous day:   Intake/Output from this shift: Total I/O In: -  Out: 300 [Urine:300]  Labs:  Recent Labs  07/07/15 0934  WBC 18.8*  HGB 15.6  PLT 190  CREATININE 1.24   CrCl cannot be calculated (Unknown ideal weight.). No results for input(s): VANCOTROUGH, VANCOPEAK, VANCORANDOM, GENTTROUGH, GENTPEAK, GENTRANDOM, TOBRATROUGH, TOBRAPEAK, TOBRARND, AMIKACINPEAK, AMIKACINTROU, AMIKACIN in the last 72 hours.   Microbiology: No results found for this or any previous visit (from the past 720  hour(s)).  Medical History: Past Medical History  Diagnosis Date  . Hypertension   . CHF (congestive heart failure)   . Arrhythmia     afib  . Neuropathy   . Cancer     lymphoma  . Heart attack 1986  . ED (erectile dysfunction)   . Coronary artery disease   . Non Hodgkin's lymphoma   . PE (pulmonary embolism)   . GERD (gastroesophageal reflux disease)   . Hyperlipidemia   . Gout   . Peptic ulcer disease   . DJD (degenerative joint disease)   . PAC (premature atrial contraction)   . CVI (common variable immunodeficiency)   . Venous stasis ulcers   . IFG (impaired fasting glucose)     Medications:  Scheduled:  . allopurinol  300 mg Oral Daily  . ALPRAZolam  0.5 mg Oral QHS  . [START ON 07/08/2015] aspirin EC  81 mg Oral QODAY  . atenolol  50 mg Oral Daily  . cholecalciferol  4,000 Units Oral Daily  . furosemide  80 mg Oral Daily  . gabapentin  100 mg Oral TID  . potassium chloride SA  20 mEq Oral TID  . sodium chloride  3 mL Intravenous Q12H  . vancomycin  1,500 mg Intravenous Q24H  . warfarin  5 mg Oral ONCE-1800  . Warfarin - Pharmacist Dosing Inpatient   Does not apply q1800   Infusions:   PRN: sodium chloride, acetaminophen **OR** acetaminophen, alum & mag hydroxide-simeth, colchicine, ondansetron **OR** ondansetron (ZOFRAN) IV, senna-docusate, sodium chloride  Assessment: 81 yo male on chronic warfarin for  afib with pacemake who presents to ED from home with weakness. He is being admitted with sepsis, source possibly LE cellulitis. Pharmacy is consulted to dose vancomycin.  Tmax: 101.5 WBC: 18.8 Renal: CKD, SCr 1.24 (baseline 1.1-1.2), CrCl 40 ml/min/1.71m2 (normalized) Lactate: 1.8  8/28 >> Vancomycin >>  8/28 blood x 2: sent 8/28 urine: ordered  Goal of Therapy:  Vancomycin trough level 10-15 mcg/ml  Plan:   Vancomycin 1500mg  IV q24h Check trough at steady state Follow up renal function & cultures, clinical course  Peggyann Juba, PharmD,  BCPS Pager: [phone] 07/07/2015,2:04 PM

## 2015-07-07 NOTE — ED Provider Notes (Signed)
CSN: [id number]     Arrival date & time 07/07/15  0865 History   First MD Initiated Contact with Patient 07/07/15 0845     Chief Complaint  Patient presents with  . Recurrent Skin Infections  . Weakness      HPI Pt weak and achy for 2 days. Swollen red legs which is not "entirely new" per EMS. No ulcers, legs are Weeping and pt is in pain. Temp 99.3.  Patient has had complaints of a headache for the last 2-3 days.  Did have a fall a few days ago but did not think he hit his head.  Patient thinks his legs are in "good shape" for him. Past Medical History  Diagnosis Date  . Hypertension   . CHF (congestive heart failure)   . Arrhythmia     afib  . Neuropathy   . Cancer     lymphoma  . Heart attack 1986  . ED (erectile dysfunction)   . Coronary artery disease   . Non Hodgkin's lymphoma   . PE (pulmonary embolism)   . GERD (gastroesophageal reflux disease)   . Hyperlipidemia   . Gout   . Peptic ulcer disease   . DJD (degenerative joint disease)   . PAC (premature atrial contraction)   . CVI (common variable immunodeficiency)   . Venous stasis ulcers   . IFG (impaired fasting glucose)    Past Surgical History  Procedure Laterality Date  . Pacemaker insertion  08/2010  . Cardiac surgery    . Eye surgery  10/2006, 11/2006  . Back and knee surgeries  unknown  . Tonsillectomy  1972  . Nasal sinus surgery  1980  . Coronary artery bypass graft  1990  . Elbow surgery      r/t gout   Family History  Problem Relation Age of Onset  . Diabetes type II Sister   . Heart disease Brother   . Diabetes type II Daughter    Social History  Substance Use Topics  . Smoking status: Former Smoker -- 1.00 packs/day for 15 years    Types: Cigarettes    Quit date: 11/13/1944  . Smokeless tobacco: Never Used  . Alcohol Use: No    Review of Systems    Allergies  Avelox; Indomethacin; Levaquin; Lipitor; Pravachol; Aleve; Norpace; Amlodipine besylate; Azithromycin; Bactrim;  Clinoril; Codeine; Digoxin; Lorazepam; Morphine and related; Other; Percocet; Sulfonamide derivatives; and Uloric  Home Medications   Prior to Admission medications   Medication Sig Start Date End Date Taking? Authorizing Provider  acetaminophen (TYLENOL) 500 MG tablet Take 500 mg by mouth at bedtime.    Yes Historical Provider, MD  allopurinol (ZYLOPRIM) 300 MG tablet Take 300 mg by mouth daily.    Yes Historical Provider, MD  ALPRAZolam Duanne Moron) 0.5 MG tablet Take 0.5 mg by mouth at bedtime. For sleep   Yes Historical Provider, MD  aspirin 81 MG tablet Take 81 mg by mouth every other day.    Yes Historical Provider, MD  atenolol (TENORMIN) 50 MG tablet Take 50 mg by mouth daily.    Yes Historical Provider, MD  cephALEXin (KEFLEX) 500 MG capsule Take 1 capsule (500 mg total) by mouth 4 (four) times daily. 07/07/15   Leonard Schwartz, MD  Cholecalciferol 2000 UNITS CAPS Take 4,000 Units by mouth daily.    Historical Provider, MD  colchicine 0.6 MG tablet Take 0.6 mg by mouth daily as needed (gout).     Historical Provider, MD  furosemide (LASIX) 80 MG  tablet Take 80 mg by mouth daily.    Historical Provider, MD  gabapentin (NEURONTIN) 100 MG capsule Take 100 mg by mouth 3 (three) times daily. Takes at 0900,1700,1900 daily    Historical Provider, MD  lactobacillus acidophilus & bulgar (LACTINEX) chewable tablet Chew 1 tablet by mouth 3 (three) times daily with meals. 05/07/15   Silver Huguenin Elgergawy, MD  metolazone (ZAROXOLYN) 2.5 MG tablet Take 1.25-2.5 mg by mouth as needed (for edema and fluid). Take 30 mins before taking furosemide as needed    Historical Provider, MD  nitroGLYCERIN (NITROSTAT) 0.4 MG SL tablet Place 0.4 mg under the tongue every 5 (five) minutes as needed. For chest pain    Historical Provider, MD  potassium chloride SA (K-DUR,KLOR-CON) 20 MEQ tablet Take 40 mEq by mouth 2 (two) times daily.     Historical Provider, MD  terbinafine (LAMISIL) 250 MG tablet Take 1 tablet (250 mg total)  by mouth daily. 12/04/13   [name], MD  warfarin (COUMADIN) 2.5 MG tablet Take 2.5 mg by mouth daily at 6 PM.     Historical Provider, MD   BP 153/69 mmHg  Pulse 76  Temp(Src) 99.3 F (37.4 C) (Oral)  Resp 18  SpO2 96% Physical Exam Physical Exam  Nursing note and vitals reviewed. Constitutional: He is oriented to person, place, and time. He appears well-developed and well-nourished. No distress.  HENT:  Head: Normocephalic and atraumatic.  Eyes: Pupils are equal, round, and reactive to light.  Neck: Normal range of motion.  Supple no meningeal signs  Cardiovascular: Normal rate and intact distal pulses.   Pulmonary/Chest: No respiratory distress.  Abdominal: Normal appearance. He exhibits no distension.  Musculoskeletal: Normal range of motion.  patient has chronic changes in lower extremities consistent with chronic venous stasis. Neurological: He is alert and oriented to person, place, and time. No cranial nerve deficit.  Skin: Skin is warm and dry. No rash noted.  Psychiatric: He has a normal mood and affect. His behavior is normal.   ED Course  Procedures (including critical care time) Labs Review Labs Reviewed  CBC WITH DIFFERENTIAL/PLATELET - Abnormal; Notable for the following:    WBC 18.8 (*)    MCV 103.5 (*)    MCH 34.1 (*)    Neutrophils Relative % 87 (*)    Neutro Abs 16.2 (*)    Lymphocytes Relative 7 (*)    Monocytes Absolute 1.2 (*)    All other components within normal limits  COMPREHENSIVE METABOLIC PANEL - Abnormal; Notable for the following:    Glucose, Bld 122 (*)    ALT 16 (*)    GFR calc non Af Amer 49 (*)    GFR calc Af Amer 57 (*)    All other components within normal limits    Imaging Review Dg Chest 2 View  07/07/2015   CLINICAL DATA:  Week and achy for 2 days.  Leg swelling.  EXAM: CHEST  2 VIEW  COMPARISON:  November 09, 2011, May 05, 2015  FINDINGS: The heart size and mediastinal contours are stable. Patient status post prior  CABG and median sternotomy. Cardiac pacemaker is unchanged. The heart size is enlarged. There is a calcified granuloma in the right mid to lung base unchanged since 2012. There is mild central pulmonary vascular congestion. There is no focal pneumonia or pleural effusion. Degenerative joint changes of the spine are noted.  IMPRESSION: Mild central pulmonary vascular congestion.  No focal pneumonia.   Electronically Signed  By: Abelardo Diesel M.D.   On: 07/07/2015 09:42   Ct Head Wo Contrast  07/07/2015   CLINICAL DATA:  Headache, weakness, myalgias for 2 days.  EXAM: CT HEAD WITHOUT CONTRAST  TECHNIQUE: Contiguous axial images were obtained from the base of the skull through the vertex without contrast.  COMPARISON:  05/06/2015  FINDINGS: Stable age related brain atrophy and chronic white matter microvascular ischemic changes. Mild associated ventricular enlargement. No acute intracranial hemorrhage, mass lesion, definite infarction, midline shift, herniation, hydrocephalus, or extra-axial fluid collection. Cisterns are patent. Cerebellar atrophy as well. Atherosclerosis of the intracranial vessels at the skullbase. Mastoids remain clear. Minor sinus mucosal thickening. Intact skull.  IMPRESSION: Stable age related brain atrophy and chronic white matter microvascular ischemic changes.  No interval change or acute process by noncontrast CT.   Electronically Signed   By: Jerilynn Mages.  Shick M.D.   On: 07/07/2015 09:28   I have personally reviewed and evaluated these images and lab results as part of my medical decision-making.    MDM   Final diagnoses:  Cellulitis of lower extremity, unspecified laterality        Leonard Schwartz, MD 07/07/15 1047

## 2015-07-07 NOTE — ED Notes (Signed)
Called report to [place], [place]

## 2015-07-07 NOTE — H&P (Signed)
[facility] History and Physical  [name] VEL:[id number] DOB: 1923-11-09 DOA: 07/07/2015   PCP: Irven Shelling, MD    Chief Complaint: Feeling weak  HPI: [name] is a 81 y.o. male who lives alone at home with a history of chronic pedal edema, systolic and diastolic heart failure, atrial fibrillation, pacemaker who was admitted from 6/21 through 6/28 and treated for cellulitis. He presented at that time with fever chills and confusion. He has had cellulitis of his lower extremities many times in the past and has seen ID in the past as well. He presents this time because he is been feeling weak since yesterday. In the ER his found to have a temperature of 101.5 and a WBC count of 18.8. The patient states that his legs do not look any different than usual. He has no complaints of sore throat, sinus drainage, cough, congestion, nausea, vomiting, diarrhea, dysuria, suprapubic pain, new rash or joint pains.   General: The patient denies anorexia, fever, weight loss Cardiac: Denies chest pain, syncope, palpitations, pedal edema  Respiratory: Denies cough, shortness of breath, wheezing GI: Denies severe indigestion/heartburn, abdominal pain, nausea, vomiting, diarrhea and constipation GU: Denies hematuria, incontinence, dysuria  Musculoskeletal: Denies arthritis  Skin: Denies suspicious skin lesions Neurologic: Denies focal weakness or numbness, change in vision Psychiatry: Denies depression or anxiety. Hematologic: no easy bruising or bleeding  All other systems reviewed and found to be negative.  Past Medical History  Diagnosis Date  . Hypertension   . CHF (congestive heart failure)   . Arrhythmia     afib  . Neuropathy   . Cancer     lymphoma  . Heart attack 1986  . ED (erectile dysfunction)   . Coronary artery disease   . Non Hodgkin's lymphoma   . PE (pulmonary embolism)   . GERD (gastroesophageal reflux disease)   . Hyperlipidemia   . Gout   .  Peptic ulcer disease   . DJD (degenerative joint disease)   . PAC (premature atrial contraction)   . CVI (common variable immunodeficiency)   . Venous stasis ulcers   . IFG (impaired fasting glucose)     Past Surgical History  Procedure Laterality Date  . Pacemaker insertion  08/2010  . Cardiac surgery    . Eye surgery  10/2006, 11/2006  . Back and knee surgeries  unknown  . Tonsillectomy  1972  . Nasal sinus surgery  1980  . Coronary artery bypass graft  1990  . Elbow surgery      r/t gout    Social History: Social History   Social History  . Marital Status: Widowed    Spouse Name: N/A  . Number of Children: N/A  . Years of Education: N/A   Occupational History  . Not on file.   Social History Main Topics  . Smoking status: Former Smoker -- 1.00 packs/day for 15 years    Types: Cigarettes    Quit date: 11/13/1944  . Smokeless tobacco: Never Used  . Alcohol Use: No  . Drug Use: No  . Sexual Activity: Yes    Birth Control/ Protection: Diaphragm   Other Topics Concern  . Not on file   Social History Narrative    Lives at home alone. Family comes over on the weekends.  His niece lives nearby   Allergies  Allergen Reactions  . Avelox [Moxifloxacin Hcl In Nacl] Other (See Comments)    Messed up lungs  . Indomethacin Other (See Comments)  Renal insufficiency  . Levaquin [Levofloxacin] Other (See Comments)    Messed up lungs  . Lipitor [Atorvastatin] Nausea Only, Palpitations and Other (See Comments)    Irregular heart beat also  . Pravachol [Pravastatin Sodium] Nausea Only, Palpitations and Other (See Comments)  . Aleve [Naproxen Sodium] Nausea And Vomiting and Other (See Comments)    GI Upset  . Norpace [Disopyramide Phosphate] Other (See Comments)    Urinary retention  . Amlodipine Besylate Rash  . Azithromycin Rash  . Bactrim [Sulfamethoxazole-Trimethoprim] Rash  . Clinoril [Sulindac] Rash  . Codeine Other (See Comments)    unknown  . Digoxin  Rash  . Lorazepam Other (See Comments)    unknown  . Morphine And Related Other (See Comments)    unknown  . Other Other (See Comments)    Tripilenomenon? Per paperwork  . Percocet [Oxycodone-Acetaminophen] Rash  . Sulfonamide Derivatives Rash  . Uloric [Febuxostat] Other (See Comments)    unknown    Family history:  Family History  Problem Relation Age of Onset  . Diabetes type II Sister   . Heart disease Brother   . Diabetes type II Daughter       Prior to Admission medications   Medication Sig Start Date End Date Taking? Authorizing Provider  acetaminophen (TYLENOL) 500 MG tablet Take 500 mg by mouth at bedtime.    Yes Historical Provider, MD  allopurinol (ZYLOPRIM) 300 MG tablet Take 300 mg by mouth daily.    Yes Historical Provider, MD  ALPRAZolam Duanne Moron) 0.5 MG tablet Take 0.5 mg by mouth at bedtime. For sleep   Yes Historical Provider, MD  aspirin 81 MG tablet Take 81 mg by mouth every other day.    Yes Historical Provider, MD  atenolol (TENORMIN) 50 MG tablet Take 50 mg by mouth daily.    Yes Historical Provider, MD  Cholecalciferol 2000 UNITS CAPS Take 4,000 Units by mouth daily.   Yes Historical Provider, MD  colchicine 0.6 MG tablet Take 0.6 mg by mouth daily as needed (gout).    Yes Historical Provider, MD  furosemide (LASIX) 80 MG tablet Take 80 mg by mouth daily.   Yes Historical Provider, MD  gabapentin (NEURONTIN) 100 MG capsule Take 100 mg by mouth 3 (three) times daily. Takes at 0900,1700,1900 daily   Yes Historical Provider, MD  meclizine (ANTIVERT) 12.5 MG tablet Take 12.5 mg by mouth 3 (three) times daily as needed for dizziness.   Yes Historical Provider, MD  metolazone (ZAROXOLYN) 2.5 MG tablet 2.5 mg daily as needed for fluid retention. Take 30 minutes before takin Furosemide as needed 04/22/15  Yes Historical Provider, MD  nitroGLYCERIN (NITROSTAT) 0.4 MG SL tablet Place 0.4 mg under the tongue every 5 (five) minutes as needed. For chest pain   Yes  Historical Provider, MD  potassium chloride SA (K-DUR,KLOR-CON) 20 MEQ tablet Take 20 mEq by mouth 3 (three) times daily.    Yes Historical Provider, MD  terbinafine (LAMISIL) 250 MG tablet Take 1 tablet (250 mg total) by mouth daily. 12/04/13  Yes [name], MD  warfarin (COUMADIN) 2.5 MG tablet Take 2.5 mg by mouth daily at 6 PM.    Yes Historical Provider, MD  cephALEXin (KEFLEX) 500 MG capsule Take 1 capsule (500 mg total) by mouth 4 (four) times daily. 07/07/15   Leonard Schwartz, MD  lactobacillus acidophilus & bulgar (LACTINEX) chewable tablet Chew 1 tablet by mouth 3 (three) times daily with meals. Patient not taking: Reported on 07/07/2015 05/07/15  Albertine Patricia, MD     Physical Exam: Filed Vitals:   07/07/15 0848 07/07/15 1131  BP: 153/69 132/64  Pulse: 76 71  Temp: 99.3 F (37.4 C) 101.5 F (38.6 C)  TempSrc: Oral Rectal  Resp: 18 18  SpO2: 96% 97%     General: Elderly man laying in bed appears quite comfortable HEENT: Normocephalic and Atraumatic, Mucous membranes pink                PERRLA; EOM intact; No scleral icterus,                 Nares: Patent, Oropharynx: Clear, Fair Dentition                 Neck: FROM, no cervical lymphadenopathy, thyromegaly, carotid bruit or JVD;  Breasts: deferred CHEST WALL: No tenderness  CHEST: Normal respiration, clear to auscultation bilaterally  HEART: Regular rate and rhythm; no murmurs rubs or gallops  BACK: No kyphosis or scoliosis; no CVA tenderness  GI: Positive Bowel Sounds, soft, non-tender; no masses, no organomegaly Rectal Exam: deferred MSK: No cyanosis, clubbing, -edema bilateral feet and legs extending up to the knee with weeping which is quite pronounced in the right leg Genitalia: not examined  SKIN:  Feet and leg have reddish purple discoloration consistent with chronic venous stasis changes CNS: Alert and Oriented x 4, Nonfocal exam, CN 2-12 intact  Labs on Admission:  Basic Metabolic  Panel:  Recent Labs Lab 07/07/15 0934  NA 137  K 4.2  CL 101  CO2 29  GLUCOSE 122*  BUN 19  CREATININE 1.24  CALCIUM 9.5   Liver Function Tests:  Recent Labs Lab 07/07/15 0934  AST 26  ALT 16*  ALKPHOS 64  BILITOT 1.1  PROT 7.5  ALBUMIN 3.7   No results for input(s): LIPASE, AMYLASE in the last 168 hours. No results for input(s): AMMONIA in the last 168 hours. CBC:  Recent Labs Lab 07/07/15 0934  WBC 18.8*  NEUTROABS 16.2*  HGB 15.6  HCT 47.3  MCV 103.5*  PLT 190   Cardiac Enzymes: No results for input(s): CKTOTAL, CKMB, CKMBINDEX, TROPONINI in the last 168 hours.  BNP (last 3 results) No results for input(s): BNP in the last 8760 hours.  ProBNP (last 3 results) No results for input(s): PROBNP in the last 8760 hours.  CBG: No results for input(s): GLUCAP in the last 168 hours.  Radiological Exams on Admission: Dg Chest 2 View  07/07/2015   CLINICAL DATA:  Week and achy for 2 days.  Leg swelling.  EXAM: CHEST  2 VIEW  COMPARISON:  November 09, 2011, May 05, 2015  FINDINGS: The heart size and mediastinal contours are stable. Patient status post prior CABG and median sternotomy. Cardiac pacemaker is unchanged. The heart size is enlarged. There is a calcified granuloma in the right mid to lung base unchanged since 2012. There is mild central pulmonary vascular congestion. There is no focal pneumonia or pleural effusion. Degenerative joint changes of the spine are noted.  IMPRESSION: Mild central pulmonary vascular congestion.  No focal pneumonia.   Electronically Signed   By: Abelardo Diesel M.D.   On: 07/07/2015 09:42   Ct Head Wo Contrast  07/07/2015   CLINICAL DATA:  Headache, weakness, myalgias for 2 days.  EXAM: CT HEAD WITHOUT CONTRAST  TECHNIQUE: Contiguous axial images were obtained from the base of the skull through the vertex without contrast.  COMPARISON:  05/06/2015  FINDINGS: Stable age related brain  atrophy and chronic white matter microvascular  ischemic changes. Mild associated ventricular enlargement. No acute intracranial hemorrhage, mass lesion, definite infarction, midline shift, herniation, hydrocephalus, or extra-axial fluid collection. Cisterns are patent. Cerebellar atrophy as well. Atherosclerosis of the intracranial vessels at the skullbase. Mastoids remain clear. Minor sinus mucosal thickening. Intact skull.  IMPRESSION: Stable age related brain atrophy and chronic white matter microvascular ischemic changes.  No interval change or acute process by noncontrast CT.   Electronically Signed   By: Jerilynn Mages.  Shick M.D.   On: 07/07/2015 09:28     Assessment/Plan Principal Problem:   Sepsis -without resultant organ failure - Fever and leukocytosis -Source may be his legs once again-difficult to tell which leg may be infected due to chronic skin changes - both are quite warm to the touch -Have asked for 2 sets of blood cultures and started vancomycin -Placed on floor store   Active Problems:  Chronic venous stasis with edema -Elevate legs to decrease edema -He wears TED hose at home -Continue Lasix  Generalized weakness -Likely due to to above-PT eval    Atrial fibrillation --Continue atenolol and Coumadin per pharmacy    PPM-[employer]    Coronary artery disease status post CABG -Continue baby aspirin       Chronic combined systolic and diastolic CHF (congestive heart failure) -Compensated-pulse ox 97% on room air-lungs clear on exam - Continue furosemide-continue potassium replacement  Gout ---Continue colchicine and allopurinol  Chronic kidney disease stage III  Consulted:   Code Status: Full code-he states he does not want to be kept alive if there is no hope  Family Communication:  DVT Prophylaxis: Coumadin per pharmacy  Time spent: 27 minutes  [place], MD [facility]  If 7PM-7AM, please contact night-coverage [email or web address] 07/07/2015, 12:24 PM

## 2015-07-07 NOTE — Discharge Instructions (Signed)

## 2015-07-08 DIAGNOSIS — L03119 Cellulitis of unspecified part of limb: Secondary | ICD-10-CM

## 2015-07-08 LAB — URINALYSIS, ROUTINE W REFLEX MICROSCOPIC
BILIRUBIN URINE: NEGATIVE
GLUCOSE, UA: NEGATIVE mg/dL
KETONES UR: NEGATIVE mg/dL
Leukocytes, UA: NEGATIVE
NITRITE: NEGATIVE
PH: 6.5 (ref 5.0–8.0)
Protein, ur: NEGATIVE mg/dL
Specific Gravity, Urine: 1.018 (ref 1.005–1.030)
Urobilinogen, UA: 0.2 mg/dL (ref 0.0–1.0)

## 2015-07-08 LAB — URINE MICROSCOPIC-ADD ON

## 2015-07-08 LAB — CBC
HCT: 42.4 % (ref 39.0–52.0)
Hemoglobin: 13.7 g/dL (ref 13.0–17.0)
MCH: 33.7 pg (ref 26.0–34.0)
MCHC: 32.3 g/dL (ref 30.0–36.0)
MCV: 104.4 fL — ABNORMAL HIGH (ref 78.0–100.0)
PLATELETS: 170 10*3/uL (ref 150–400)
RBC: 4.06 MIL/uL — ABNORMAL LOW (ref 4.22–5.81)
RDW: 14.7 % (ref 11.5–15.5)
WBC: 11.7 10*3/uL — ABNORMAL HIGH (ref 4.0–10.5)

## 2015-07-08 LAB — PROTIME-INR
INR: 1.75 — ABNORMAL HIGH (ref 0.00–1.49)
PROTHROMBIN TIME: 20.5 s — AB (ref 11.6–15.2)

## 2015-07-08 MED ORDER — BOOST PLUS PO LIQD
237.0000 mL | Freq: Two times a day (BID) | ORAL | Status: DC
  Administered 2015-07-08 (×2): 237 mL via ORAL
  Filled 2015-07-08 (×4): qty 237

## 2015-07-08 MED ORDER — SACCHAROMYCES BOULARDII 250 MG PO CAPS
250.0000 mg | ORAL_CAPSULE | Freq: Two times a day (BID) | ORAL | Status: DC
  Administered 2015-07-08 – 2015-07-09 (×3): 250 mg via ORAL
  Filled 2015-07-08 (×4): qty 1

## 2015-07-08 MED ORDER — WARFARIN SODIUM 4 MG PO TABS
4.0000 mg | ORAL_TABLET | Freq: Once | ORAL | Status: AC
  Administered 2015-07-08: 4 mg via ORAL
  Filled 2015-07-08: qty 1

## 2015-07-08 NOTE — Plan of Care (Signed)
Problem: Consults Goal: General Medical Patient Education See Patient Education Module for specific education. Outcome: Not Met (add Reason) Concerns for BLE, cellulitis

## 2015-07-08 NOTE — Progress Notes (Signed)
   07/08/15 1700  Advance Directives (For Healthcare)  Does patient have an advance directive? Yes  Type of Paramedic of [place];Living will  Does patient want to make changes to advanced directive? No - Patient declined  Copy of advanced directive(s) in chart? Yes    Pt completed healthcare power of attorney and living will.  Notarized.  Copy placed in chart.  Original with pt.     [employer], [place]

## 2015-07-08 NOTE — Plan of Care (Signed)
Problem: Consults Goal: Skin Care Protocol Initiated - if Braden Score 18 or less If consults are not indicated, leave blank or document N/A Outcome: Not Met (add Reason) Weeping and edema on BLE due to cellulitis

## 2015-07-08 NOTE — Progress Notes (Signed)
ANTICOAGULATION CONSULT NOTE - Initial Consult  Pharmacy Consult for Warfarin Indication: atrial fibrillation  Allergies  Allergen Reactions  . Avelox [Moxifloxacin Hcl In Nacl] Other (See Comments)    Messed up lungs  . Indomethacin Other (See Comments)    Renal insufficiency  . Levaquin [Levofloxacin] Other (See Comments)    Messed up lungs  . Lipitor [Atorvastatin] Nausea Only, Palpitations and Other (See Comments)    Irregular heart beat also  . Pravachol [Pravastatin Sodium] Nausea Only, Palpitations and Other (See Comments)  . Aleve [Naproxen Sodium] Nausea And Vomiting and Other (See Comments)    GI Upset  . Norpace [Disopyramide Phosphate] Other (See Comments)    Urinary retention  . Amlodipine Besylate Rash  . Azithromycin Rash  . Bactrim [Sulfamethoxazole-Trimethoprim] Rash  . Clinoril [Sulindac] Rash  . Codeine Other (See Comments)    unknown  . Digoxin Rash  . Lorazepam Other (See Comments)    unknown  . Morphine And Related Other (See Comments)    unknown  . Other Other (See Comments)    Tripilenomenon? Per paperwork  . Percocet [Oxycodone-Acetaminophen] Rash  . Sulfonamide Derivatives Rash  . Uloric [Febuxostat] Other (See Comments)    unknown    Patient Measurements:   Vital Signs: Temp: 98.3 F (36.8 C) (08/29 0631) Temp Source: Oral (08/29 0631) BP: 113/52 mmHg (08/29 0631) Pulse Rate: 69 (08/29 0631)  Labs:  Recent Labs  07/07/15 0934 07/07/15 1544 07/08/15 0511  HGB 15.6 14.4 13.7  HCT 47.3 44.8 42.4  PLT 190 184 170  APTT 38*  --   --   LABPROT 21.4*  --  20.5*  INR 1.86*  --  1.75*  CREATININE 1.24  --   --     CrCl cannot be calculated (Unknown ideal weight.).   Medical History: Past Medical History  Diagnosis Date  . Hypertension   . CHF (congestive heart failure)   . Arrhythmia     afib  . Neuropathy   . Cancer     lymphoma  . Heart attack 1986  . ED (erectile dysfunction)   . Coronary artery disease   . Non  Hodgkin's lymphoma   . PE (pulmonary embolism)   . GERD (gastroesophageal reflux disease)   . Hyperlipidemia   . Gout   . Peptic ulcer disease   . DJD (degenerative joint disease)   . PAC (premature atrial contraction)   . CVI (common variable immunodeficiency)   . Venous stasis ulcers   . IFG (impaired fasting glucose)     Medications:  Scheduled:  . allopurinol  300 mg Oral Daily  . ALPRAZolam  0.5 mg Oral QHS  . aspirin EC  81 mg Oral QODAY  . atenolol  50 mg Oral Daily  . cholecalciferol  4,000 Units Oral Daily  . furosemide  80 mg Oral Daily  . gabapentin  100 mg Oral TID  . potassium chloride SA  20 mEq Oral TID  . saccharomyces boulardii  250 mg Oral BID  . sodium chloride  3 mL Intravenous Q12H  . vancomycin  1,500 mg Intravenous Q24H  . warfarin  4 mg Oral ONCE-1800  . Warfarin - Pharmacist Dosing Inpatient   Does not apply q1800    Assessment: 81 yo male on chronic warfarin for afib with pacemake who presents to ED from home with weakness. He is being admitted with sepsis, source possibly LE cellulitis. Pharmacy is consulted to continue warfarin dosing on admission.  Home dose reported as 2.5mg   daily, last taken 8/27  Today, 07/08/2015:  INR subtherapeutic at 1.75 (Warfarin dose NOT charted on 8/28)  CBC trending down: hgb 13.7, plt 170  No bleeding documented  Drug-drug intxn: abx with vancomycin  Diet: at 100% of supper on 8/28  Goal of Therapy:  INR 2-3   Plan:   Warfarin 4mg  PO x 1 today at 18:00  Daily PT/INR  Dia Sitter, PharmD, BCPS 07/08/2015 8:25 AM

## 2015-07-08 NOTE — Progress Notes (Signed)
[facility] Progress Note   [name]  HKV:[id number]  DOB: 1923-07-26  DOA: 07/07/2015 PCP: Irven Shelling, MD  Brief narrative: [name] is a 81 y.o. male who lives alone at home with a history of chronic pedal edema, systolic and diastolic heart failure, atrial fibrillation, pacemaker who was admitted from 6/21 through 6/28 and treated for cellulitis. He presented at that time with fever chills and confusion. He has had cellulitis of his lower extremities many times in the past and has seen ID in the past as well. He presents to the ER because he is been feeling weak. In the ER he was found to have a temperature of 101.5 and a WBC count of 18.8. The patient states that his legs do not look any different than usual. He has no complaints of sore throat, sinus drainage, cough, congestion, nausea, vomiting, diarrhea, dysuria, suprapubic pain, new rash or joint pains.   Subjective: Feels much stronger today than he did yesterday. Body aches have resolved.  I have noted that he's got some chest congestion which he states is not uncommon for him in the mornings.  Assessment/Plan: Principal Problem:  Sepsis -without resultant organ failure - Fever and leukocytosis-WBC count improved from 19 to 11 and fevers resolved -Source may be his legs once again-no other source found-difficult to tell which leg may be infected due to chronic skin changes - both are quite warm to the touch - started vancomycin -Culture negative so far -Continue florastor   Active Problems:  Chronic venous stasis with edema -Elevate legs to decrease edema-weeping of the right leg has improved and resolved completely -He wears TED hose at home -Continue Lasix  Generalized weakness -Likely due to to above-PT commands home health PT   Atrial fibrillation --Continue atenolol and Coumadin per pharmacy   PPM-[facility]   Coronary artery disease status post CABG -Continue baby aspirin     Chronic combined systolic and diastolic CHF (congestive heart failure) -Compensated-pulse ox 97% on room air-lungs clear on exam - Continue furosemide-continue potassium replacement  Gout ---Continue colchicine and allopurinol  Chronic kidney disease stage III    Code Status:     Code Status Orders        Start     Ordered   07/07/15 1233  Full code   Continuous     07/07/15 1233     Family Communication: attempted to call son [name] - left message Disposition Plan: likely home in 1-2 days DVT prophylaxis: Coumadin Consultants: Procedures: Appt with PCP: requested Antibiotics: Anti-infectives    Start     Dose/Rate Route Frequency Ordered Stop   07/07/15 1400  vancomycin (VANCOCIN) 1,500 mg in sodium chloride 0.9 % 500 mL IVPB     1,500 mg 250 mL/hr over 120 Minutes Intravenous Every 24 hours 07/07/15 1309     07/07/15 1145  vancomycin (VANCOCIN) IVPB 1000 mg/200 mL premix  Status:  Discontinued     1,000 mg 200 mL/hr over 60 Minutes Intravenous  Once 07/07/15 1142 07/07/15 1309   07/07/15 0000  cephALEXin (KEFLEX) 500 MG capsule     500 mg Oral 4 times daily 07/07/15 1045        Objective: Filed Weights   07/08/15 0925  Weight: 87 kg (191 lb 12.8 oz)    Intake/Output Summary (Last 24 hours) at 07/08/15 1555 Last data filed at 07/08/15 0900  Gross per 24 hour  Intake    600 ml  Output    500 ml  Net    100 ml     Vitals Filed Vitals:   07/08/15 0925 07/08/15 1040 07/08/15 1457 07/08/15 1500  BP:  137/65 132/57   Pulse:   64   Temp:   97.3 F (36.3 C)   TempSrc:   Oral   Resp:   19   Height:    5\' 2"  (1.575 m)  Weight: 87 kg (191 lb 12.8 oz)     SpO2:   97%     Exam:  General:  Pt is alert, not in acute distress  HEENT: No icterus, No thrush, oral mucosa moist  Cardiovascular: regular rate and rhythm, S1/S2 No murmur  Respiratory: clear to auscultation bilaterally   Abdomen: Soft, +Bowel sounds, non tender, non distended, no  guarding  MSK:  No cyanosis or clubbing-edema bilateral feet and legs extending up to the knee with weeping which is quite pronounced in the right leg  SKIN: Feet and leg have reddish purple discoloration consistent with chronic venous stasis changes  Data Reviewed: Basic Metabolic Panel:  Recent Labs Lab 07/07/15 0934  NA 137  K 4.2  CL 101  CO2 29  GLUCOSE 122*  BUN 19  CREATININE 1.24  CALCIUM 9.5   Liver Function Tests:  Recent Labs Lab 07/07/15 0934  AST 26  ALT 16*  ALKPHOS 64  BILITOT 1.1  PROT 7.5  ALBUMIN 3.7   No results for input(s): LIPASE, AMYLASE in the last 168 hours. No results for input(s): AMMONIA in the last 168 hours. CBC:  Recent Labs Lab 07/07/15 0934 07/07/15 1544 07/08/15 0511  WBC 18.8* 19.4* 11.7*  NEUTROABS 16.2* 15.4*  --   HGB 15.6 14.4 13.7  HCT 47.3 44.8 42.4  MCV 103.5* 103.9* 104.4*  PLT 190 184 170   Cardiac Enzymes: No results for input(s): CKTOTAL, CKMB, CKMBINDEX, TROPONINI in the last 168 hours. BNP (last 3 results) No results for input(s): BNP in the last 8760 hours.  ProBNP (last 3 results) No results for input(s): PROBNP in the last 8760 hours.  CBG:  Recent Labs Lab 07/07/15 1738  GLUCAP 108*    Recent Results (from the past 240 hour(s))  Culture, blood (routine x 2)     Status: None (Preliminary result)   Collection Time: 07/07/15 11:23 AM  Result Value Ref Range Status   Specimen Description BLOOD LEFT FOREARM  Final   Special Requests BOTTLES DRAWN AEROBIC AND ANAEROBIC 5.5 CC EACH  Final   Culture   Final    NO GROWTH < 24 HOURS Performed at [facility]    Report Status PENDING  Incomplete  Culture, blood (routine x 2)     Status: None (Preliminary result)   Collection Time: 07/07/15 12:55 PM  Result Value Ref Range Status   Specimen Description BLOOD RIGHT ARM  Final   Special Requests BOTTLES DRAWN AEROBIC AND ANAEROBIC 10CC  Final   Culture   Final    NO GROWTH < 24  HOURS Performed at [facility]    Report Status PENDING  Incomplete     Studies: Dg Chest 2 View  07/07/2015   CLINICAL DATA:  Week and achy for 2 days.  Leg swelling.  EXAM: CHEST  2 VIEW  COMPARISON:  November 09, 2011, May 05, 2015  FINDINGS: The heart size and mediastinal contours are stable. Patient status post prior CABG and median sternotomy. Cardiac pacemaker is unchanged. The heart size is enlarged. There is a calcified granuloma in the right  mid to lung base unchanged since 2012. There is mild central pulmonary vascular congestion. There is no focal pneumonia or pleural effusion. Degenerative joint changes of the spine are noted.  IMPRESSION: Mild central pulmonary vascular congestion.  No focal pneumonia.   Electronically Signed   By: Abelardo Diesel M.D.   On: 07/07/2015 09:42   Ct Head Wo Contrast  07/07/2015   CLINICAL DATA:  Headache, weakness, myalgias for 2 days.  EXAM: CT HEAD WITHOUT CONTRAST  TECHNIQUE: Contiguous axial images were obtained from the base of the skull through the vertex without contrast.  COMPARISON:  05/06/2015  FINDINGS: Stable age related brain atrophy and chronic white matter microvascular ischemic changes. Mild associated ventricular enlargement. No acute intracranial hemorrhage, mass lesion, definite infarction, midline shift, herniation, hydrocephalus, or extra-axial fluid collection. Cisterns are patent. Cerebellar atrophy as well. Atherosclerosis of the intracranial vessels at the skullbase. Mastoids remain clear. Minor sinus mucosal thickening. Intact skull.  IMPRESSION: Stable age related brain atrophy and chronic white matter microvascular ischemic changes.  No interval change or acute process by noncontrast CT.   Electronically Signed   By: Jerilynn Mages.  Shick M.D.   On: 07/07/2015 09:28    Scheduled Meds:  Scheduled Meds: . allopurinol  300 mg Oral Daily  . ALPRAZolam  0.5 mg Oral QHS  . aspirin EC  81 mg Oral QODAY  . atenolol  50 mg Oral Daily  .  cholecalciferol  4,000 Units Oral Daily  . furosemide  80 mg Oral Daily  . gabapentin  100 mg Oral TID  . lactose free nutrition  237 mL Oral BID BM  . potassium chloride SA  20 mEq Oral TID  . saccharomyces boulardii  250 mg Oral BID  . sodium chloride  3 mL Intravenous Q12H  . vancomycin  1,500 mg Intravenous Q24H  . warfarin  4 mg Oral ONCE-1800  . warfarin  4 mg Oral ONCE-1800  . Warfarin - Pharmacist Dosing Inpatient   Does not apply q1800   Continuous Infusions:   Time spent on care of this patient: 25 min   [place], MD 07/08/2015, 3:55 PM  LOS: 1 day   [facility] Office  [phone] Pager - Text Page per [email or web address] If 7PM-7AM, please contact night-coverage [email or web address]

## 2015-07-08 NOTE — Progress Notes (Signed)
Nutrition Brief Note  Patient identified on the Malnutrition Screening Tool (MST) Report  Wt Readings from Last 15 Encounters:  07/08/15 191 lb 12.8 oz (87 kg)  05/07/15 192 lb 14.4 oz (87.5 kg)  10/09/14 192 lb 6.4 oz (87.272 kg)  01/22/14 186 lb (84.369 kg)  01/10/14 188 lb (85.276 kg)  12/04/13 186 lb (84.369 kg)  11/01/13 188 lb (85.276 kg)  10/19/13 186 lb (84.369 kg)  09/26/13 190 lb 8 oz (86.41 kg)  09/13/13 194 lb (87.998 kg)  09/12/13 193 lb (87.544 kg)  09/05/13 191 lb (86.637 kg)  08/24/13 190 lb (86.183 kg)  07/03/13 194 lb 6.4 oz (88.179 kg)  05/16/13 196 lb 3.2 oz (88.996 kg)    Body mass index is 32.91 kg/(m^2). Patient meets criteria for obesity based on current BMI.   Pt seen for MST. Pt ate 100% of breakfast last night and for breakfast this AM he had 1 piece of toast, 100% scrambled eggs, and ~25% oatmeal. He states he would have eaten more of the oatmeal but he needs more sugar for it and for it to remain warm. Pt states that PTA he had a very good appetite and typically ate more than he currently is because he had a special chair at his dinner table which helped him sit up straighter than he is able to with sitting on the side of the bed. Pt was previously at a rehab facility and denies receiving Ensure or Boost there but is interested in trying during hospitalization; will order Boost Plus BID. Pt is meeting needs.  Current diet order is 2 gram Na, patient is consuming approximately 100% of meals at this time. Labs and medications reviewed.   No nutrition interventions warranted at this time. If nutrition issues arise, please consult RD.      Jarome Matin, RD, LDN Inpatient Clinical Dietitian Pager # [phone] After hours/weekend pager # [phone]

## 2015-07-08 NOTE — Evaluation (Signed)
Physical Therapy Evaluation Patient Details Name: [name] MRN: [id number] DOB: 1922/11/29 Today's Date: 07/08/2015   History of Present Illness  81 yo male admitted with sepsis, cellulitis. Hx of HTN, CHF, neuropathy, A fib, PE, NHL, venous stasis ulcers, chronic pedal edema, pacemaker. Pt lives alone  Clinical Impression  On eval, pt was Min assist for mobility-walked ~135 feet with RW. Pt tolerated activity well. Recommend HHPT at discharge.    Follow Up Recommendations Home health PT;Supervision - Intermittent    Equipment Recommendations  None recommended by PT    Recommendations for Other Services       Precautions / Restrictions Precautions Precautions: Fall Restrictions Weight Bearing Restrictions: No      Mobility  Bed Mobility Overal bed mobility: Needs Assistance Bed Mobility: Supine to Sit     Supine to sit: Min assist;HOB elevated     General bed mobility comments: assist for trunk to upright. increased time.  Transfers Overall transfer level: Needs assistance Equipment used: Rolling walker (2 wheeled) Transfers: Sit to/from Stand Sit to Stand: Min assist         General transfer comment: assist to rise, stabilize, control descent. vcs safety, technique, hand placement. Pt was able to rise from toilet, without assistance, with use of grabbar.   Ambulation/Gait Ambulation/Gait assistance: Min guard Ambulation Distance (Feet): 135 Feet Assistive device: Rolling walker (2 wheeled) Gait Pattern/deviations: Trunk flexed;Decreased stride length;Step-through pattern     General Gait Details: close guard for safety. slow gait speed.   Stairs            Wheelchair Mobility    Modified Rankin (Stroke Patients Only)       Balance Overall balance assessment: Needs assistance         Standing balance support: Bilateral upper extremity supported;During functional activity Standing balance-Leahy Scale: Poor Standing balance comment:  needs RW                             Pertinent Vitals/Pain Pain Assessment: No/denies pain    Home Living Family/patient expects to be discharged to:: Private residence Living Arrangements: Alone Available Help at Discharge: Family;Available PRN/intermittently Type of Home: House Home Access: Ramped entrance     Home Layout: One level Home Equipment: Wheelchair - manual;Wheelchair - power;Walker - 2 wheels;Tub bench;Cane - single point;Grab bars - tub/shower;Grab bars - toilet;Shower seat      Prior Function Level of Independence: Independent with assistive device(s)         Comments: pt uses RW all the time in the house and takes power chair to the mailbox     Hand Dominance        Extremity/Trunk Assessment   Upper Extremity Assessment: Generalized weakness           Lower Extremity Assessment: Generalized weakness;RLE deficits/detail;LLE deficits/detail      Cervical / Trunk Assessment: Kyphotic  Communication   Communication: No difficulties  Cognition Arousal/Alertness: Awake/alert Behavior During Therapy: WFL for tasks assessed/performed Overall Cognitive Status: Within Functional Limits for tasks assessed                      General Comments      Exercises        Assessment/Plan    PT Assessment Patient needs continued PT services  PT Diagnosis Difficulty walking;Generalized weakness   PT Problem List Decreased strength;Decreased balance;Decreased mobility;Decreased knowledge of use of DME  PT Treatment Interventions  DME instruction;Gait training;Functional mobility training;Therapeutic activities;Patient/family education;Therapeutic exercise;Balance training   PT Goals (Current goals can be found in the Care Plan section) Acute Rehab PT Goals Patient Stated Goal: home soon PT Goal Formulation: With patient/family Time For Goal Achievement: 07/22/15 Potential to Achieve Goals: Fair    Frequency Min 3X/week    Barriers to discharge        Co-evaluation               End of Session Equipment Utilized During Treatment: Gait belt Activity Tolerance: Patient tolerated treatment well Patient left: in chair;with call bell/phone within reach           Time: 1019-1056 PT Time Calculation (min) (ACUTE ONLY): 37 min   Charges:   PT Evaluation $Initial PT Evaluation Tier I: 1 Procedure PT Treatments $Gait Training: 8-22 mins   PT G Codes:        Weston Anna, MPT Pager: [phone]

## 2015-07-09 DIAGNOSIS — L03115 Cellulitis of right lower limb: Secondary | ICD-10-CM

## 2015-07-09 DIAGNOSIS — L03116 Cellulitis of left lower limb: Secondary | ICD-10-CM

## 2015-07-09 LAB — CBC
HCT: 42.9 % (ref 39.0–52.0)
Hemoglobin: 13.9 g/dL (ref 13.0–17.0)
MCH: 33.6 pg (ref 26.0–34.0)
MCHC: 32.4 g/dL (ref 30.0–36.0)
MCV: 103.6 fL — ABNORMAL HIGH (ref 78.0–100.0)
PLATELETS: 167 10*3/uL (ref 150–400)
RBC: 4.14 MIL/uL — ABNORMAL LOW (ref 4.22–5.81)
RDW: 14.4 % (ref 11.5–15.5)
WBC: 9.6 10*3/uL (ref 4.0–10.5)

## 2015-07-09 LAB — PROTIME-INR
INR: 1.5 — AB (ref 0.00–1.49)
Prothrombin Time: 18.2 seconds — ABNORMAL HIGH (ref 11.6–15.2)

## 2015-07-09 LAB — URINE CULTURE: Culture: NO GROWTH

## 2015-07-09 MED ORDER — SACCHAROMYCES BOULARDII 250 MG PO CAPS: 250.0000 mg | ORAL_CAPSULE | Freq: Two times a day (BID) | ORAL | Status: DC

## 2015-07-09 MED ORDER — WARFARIN SODIUM 4 MG PO TABS
4.0000 mg | ORAL_TABLET | Freq: Once | ORAL | Status: DC
  Filled 2015-07-09: qty 1

## 2015-07-09 MED ORDER — DOXYCYCLINE HYCLATE 100 MG PO TABS: 100.0000 mg | ORAL_TABLET | Freq: Two times a day (BID) | ORAL | Status: DC

## 2015-07-09 MED ORDER — DOXYCYCLINE HYCLATE 100 MG PO TABS
100.0000 mg | ORAL_TABLET | Freq: Once | ORAL | Status: AC
  Administered 2015-07-09: 100 mg via ORAL
  Filled 2015-07-09: qty 1

## 2015-07-09 NOTE — Care Management Important Message (Signed)
Important Message  Patient Details  Name: ARSHIA RONDON MRN: [id number] Date of Birth: 1923/05/14   Medicare Important Message Given:  Yes-second notification given    Camillo Flaming 07/09/2015, 10:34 AMImportant Message  Patient Details  Name: ELIAZER HEMPHILL MRN: [id number] Date of Birth: 17-Mar-1923   Medicare Important Message Given:  Yes-second notification given    Camillo Flaming 07/09/2015, 10:34 AM

## 2015-07-09 NOTE — Progress Notes (Signed)
ANTICOAGULATION CONSULT NOTE - Initial Consult  Pharmacy Consult for Warfarin Indication: atrial fibrillation  Allergies  Allergen Reactions  . Avelox [Moxifloxacin Hcl In Nacl] Other (See Comments)    Messed up lungs  . Indomethacin Other (See Comments)    Renal insufficiency  . Levaquin [Levofloxacin] Other (See Comments)    Messed up lungs  . Lipitor [Atorvastatin] Nausea Only, Palpitations and Other (See Comments)    Irregular heart beat also  . Pravachol [Pravastatin Sodium] Nausea Only, Palpitations and Other (See Comments)  . Aleve [Naproxen Sodium] Nausea And Vomiting and Other (See Comments)    GI Upset  . Norpace [Disopyramide Phosphate] Other (See Comments)    Urinary retention  . Amlodipine Besylate Rash  . Azithromycin Rash  . Bactrim [Sulfamethoxazole-Trimethoprim] Rash  . Clinoril [Sulindac] Rash  . Codeine Other (See Comments)    unknown  . Digoxin Rash  . Lorazepam Other (See Comments)    unknown  . Morphine And Related Other (See Comments)    unknown  . Other Other (See Comments)    Tripilenomenon? Per paperwork  . Percocet [Oxycodone-Acetaminophen] Rash  . Sulfonamide Derivatives Rash  . Uloric [Febuxostat] Other (See Comments)    unknown    Patient Measurements: Height: 5\' 2"  (157.5 cm) Weight: 191 lb 12.8 oz (87 kg) IBW/kg (Calculated) : 54.6 Vital Signs: Temp: 97.9 F (36.6 C) (08/30 0454) Temp Source: Oral (08/30 0454) BP: 123/64 mmHg (08/30 0454) Pulse Rate: 69 (08/30 0454)  Labs:  Recent Labs  07/07/15 0934 07/07/15 1544 07/08/15 0511 07/09/15 0525  HGB 15.6 14.4 13.7 13.9  HCT 47.3 44.8 42.4 42.9  PLT 190 184 170 167  APTT 38*  --   --   --   LABPROT 21.4*  --  20.5* 18.2*  INR 1.86*  --  1.75* 1.50*  CREATININE 1.24  --   --   --     Estimated Creatinine Clearance: 37.9 mL/min (by C-G formula based on Cr of 1.24).   Medical History: Past Medical History  Diagnosis Date  . Hypertension   . CHF (congestive heart  failure)   . Arrhythmia     afib  . Neuropathy   . Cancer     lymphoma  . Heart attack 1986  . ED (erectile dysfunction)   . Coronary artery disease   . Non Hodgkin's lymphoma   . PE (pulmonary embolism)   . GERD (gastroesophageal reflux disease)   . Hyperlipidemia   . Gout   . Peptic ulcer disease   . DJD (degenerative joint disease)   . PAC (premature atrial contraction)   . CVI (common variable immunodeficiency)   . Venous stasis ulcers   . IFG (impaired fasting glucose)     Medications:  Scheduled:  . allopurinol  300 mg Oral Daily  . ALPRAZolam  0.5 mg Oral QHS  . aspirin EC  81 mg Oral QODAY  . atenolol  50 mg Oral Daily  . cholecalciferol  4,000 Units Oral Daily  . furosemide  80 mg Oral Daily  . gabapentin  100 mg Oral TID  . lactose free nutrition  237 mL Oral BID BM  . potassium chloride SA  20 mEq Oral TID  . saccharomyces boulardii  250 mg Oral BID  . sodium chloride  3 mL Intravenous Q12H  . vancomycin  1,500 mg Intravenous Q24H  . Warfarin - Pharmacist Dosing Inpatient   Does not apply q1800    Assessment: 81 yo male on chronic warfarin for  afib (CHADSVASC 4) with pacemake who presents to ED from home with weakness. He is being admitted with sepsis, source possibly LE cellulitis. Pharmacy is consulted to continue warfarin dosing on admission.  Home dose reported as 2.5mg  daily, last taken 8/27  Today, 07/09/2015:  INR subtherapeutic at 1.50 and trending down (Warfarin dose NOT charted on 8/28)  CBC relatively stable  No bleeding documented  Drug-drug intxn: abx with vancomycin  Diet: meals consumption not consistently documented  Goal of Therapy:  INR 2-3   Plan:   Repeat Warfarin 4mg  PO x 1 today  Daily PT/INR  Consider bridging with heparin drip or LMWH until INR is therapeutic  Dia Sitter, PharmD, BCPS 07/09/2015 8:34 AM

## 2015-07-09 NOTE — Care Management Note (Signed)
Case Management Note  Patient Details  Name: [name] MRN: [id number] Date of Birth: January 22, 1923  Subjective/Objective:                Admitted with LE cellulitis   Action/Plan: Discharge planning, spoke with patient and son at bedside. Patient states he has used [facility] in then past and would like to use them again. Requesting Amy RN if possible, states she did a great job to help heal his wounds in the past. No DME needs identified. Contacted [facility] to arrange.  Expected Discharge Date:  07/09/15               Expected Discharge Plan:  [place]  In-House Referral:  NA  Discharge planning Services  CM Consult  Post Acute Care Choice:  Home Health Choice offered to:  Patient  DME Arranged:  N/A DME Agency:  NA  HH Arranged:  RN, PT, Disease Management, OT, Nurse's Aide [place] Agency:  [facility]  Status of Service:  Completed, signed off  Medicare Important Message Given:    Date Medicare IM Given:    Medicare IM give by:    Date Additional Medicare IM Given:    Additional Medicare Important Message give by:     If discussed at [employer] of Stay Meetings, dates discussed:    Additional Comments:  Guadalupe Maple, RN 07/09/2015, 10:25 AM

## 2015-07-12 LAB — CULTURE, BLOOD (ROUTINE X 2)
CULTURE: NO GROWTH
Culture: NO GROWTH

## 2015-07-12 NOTE — Discharge Summary (Signed)
Physician Discharge Summary  [name] PRX:[id number] DOB: [birth year] DOA: 07/07/2015  PCP: Irven Shelling, MD  Admit date: 07/07/2015 Discharge date: 07/12/2015  Time spent: 55 minutes  Recommendations for Outpatient Follow-up:  1. Continue to aggressively control edema and prevent skin breakdown to prevent further episodes of cellulitis  Discharge Condition: stable  Discharge Diagnoses:  Principal Problem:   Bilateral lower leg cellulitis Active Problems:   Sepsis due to other organism, without resultant organ failure   Atrial fibrillation   PPM-St.Jude   Coronary artery disease   Generalized weakness   Chronic combined systolic and diastolic CHF (congestive heart failure) Venous stasis with severe edema  History of present illness:  [name] is a 81 y.o. male who lives alone at home with a history of chronic pedal edema, systolic and diastolic heart failure, atrial fibrillation, pacemaker who was admitted from 6/21 through 6/28 and treated for cellulitis. He presented at that time with fever chills and confusion. He has had cellulitis of his lower extremities many times in the past and has seen ID in the past as well. He presents to the ER because he is been feeling weak. In the ER he was found to have a temperature of 101.5 and a WBC count of 18.8. The patient thinks that his legs do not look any different than usual. He has no complaints of sore throat, sinus drainage, cough, congestion, nausea, vomiting, diarrhea, dysuria, suprapubic pain, new rash or joint pains.   Hospital Course:  Principal Problem:  Sepsis -without resultant organ failure - Fever and leukocytosis-WBC count improved from 19 to 9 and fevers resolved -Source likely his legs once again-no other source found-difficult to tell which leg may be infected due to chronic skin changes - both were quite warm to the touch- this has resolved with treatment. - started vancomycin- switching to Doxycycline  on discharge -blood culture negative  -Continue florastor   Active Problems:  Chronic venous stasis with edema - severe weeping of right leg on admission -Elevated legs to decrease edema-weeping of the right leg has resolved completely -He wears TED hose at home -Continue Lasix  Generalized weakness -Likely due to to above-PT recommends home health PT which has been ordered   Atrial fibrillation --Continue atenolol and Coumadin per pharmacy   PPM-[facility].Jude   Coronary artery disease status post CABG -Continue baby aspirin    Chronic combined systolic and diastolic CHF (congestive heart failure) -Compensated-pulse ox 97% on room air-lungs clear on exam - Continue furosemide-continue potassium replacement  Gout ---Continue colchicine and allopurinol  Chronic kidney disease stage III - stable        Discharge Exam: Filed Weights   07/08/15 0925  Weight: 87 kg (191 lb 12.8 oz)   Filed Vitals:   07/09/15 0454  BP: 123/64  Pulse: 69  Temp: 97.9 F (36.6 C)  Resp: 20    General: AAO x 3, no distress Cardiovascular: RRR, no murmurs  Respiratory: clear to auscultation bilaterally GI: soft, non-tender, non-distended, bowel sound positive  Discharge Instructions You were cared for by a hospitalist during your hospital stay. If you have any questions about your discharge medications or the care you received while you were in the hospital after you are discharged, you can call the unit and asked to speak with the hospitalist on call if the hospitalist that took care of you is not available. Once you are discharged, your primary care physician will handle any further medical issues. Please note that NO REFILLS  for any discharge medications will be authorized once you are discharged, as it is imperative that you return to your primary care physician (or establish a relationship with a primary care physician if you do not have one) for your aftercare needs so that they  can reassess your need for medications and monitor your lab values.  Discharge Instructions    Diet - low sodium heart healthy    Complete by:  As directed      Increase activity slowly    Complete by:  As directed             Medication List    TAKE these medications        acetaminophen 500 MG tablet  Commonly known as:  TYLENOL  Take 500 mg by mouth at bedtime.     allopurinol 300 MG tablet  Commonly known as:  ZYLOPRIM  Take 300 mg by mouth daily.     ALPRAZolam 0.5 MG tablet  Commonly known as:  XANAX  Take 0.5 mg by mouth at bedtime. For sleep     aspirin 81 MG tablet  Take 81 mg by mouth every other day.     atenolol 50 MG tablet  Commonly known as:  TENORMIN  Take 50 mg by mouth daily.     cephALEXin 500 MG capsule  Commonly known as:  KEFLEX  Take 1 capsule (500 mg total) by mouth 4 (four) times daily.     Cholecalciferol 2000 UNITS Caps  Take 4,000 Units by mouth daily.     colchicine 0.6 MG tablet  Take 0.6 mg by mouth daily as needed (gout).     doxycycline 100 MG tablet  Commonly known as:  VIBRA-TABS  Take 1 tablet (100 mg total) by mouth 2 (two) times daily.     furosemide 80 MG tablet  Commonly known as:  LASIX  Take 80 mg by mouth daily.     gabapentin 100 MG capsule  Commonly known as:  NEURONTIN  Take 100 mg by mouth 3 (three) times daily. Takes at 0900,1700,1900 daily     meclizine 12.5 MG tablet  Commonly known as:  ANTIVERT  Take 12.5 mg by mouth 3 (three) times daily as needed for dizziness.     metolazone 2.5 MG tablet  Commonly known as:  ZAROXOLYN  2.5 mg daily as needed for fluid retention. Take 30 minutes before takin Furosemide as needed     nitroGLYCERIN 0.4 MG SL tablet  Commonly known as:  NITROSTAT  Place 0.4 mg under the tongue every 5 (five) minutes as needed. For chest pain     potassium chloride SA 20 MEQ tablet  Commonly known as:  K-DUR,KLOR-CON  Take 20 mEq by mouth 3 (three) times daily.     saccharomyces  boulardii 250 MG capsule  Commonly known as:  FLORASTOR  Take 1 capsule (250 mg total) by mouth 2 (two) times daily.     terbinafine 250 MG tablet  Commonly known as:  LAMISIL  Take 1 tablet (250 mg total) by mouth daily.     warfarin 2.5 MG tablet  Commonly known as:  COUMADIN  Take 2.5 mg by mouth daily at 6 PM.       Allergies  Allergen Reactions  . Avelox [Moxifloxacin Hcl In Nacl] Other (See Comments)    Messed up lungs  . Indomethacin Other (See Comments)    Renal insufficiency  . Levaquin [Levofloxacin] Other (See Comments)    Messed up lungs  .  Lipitor [Atorvastatin] Nausea Only, Palpitations and Other (See Comments)    Irregular heart beat also  . Pravachol [Pravastatin Sodium] Nausea Only, Palpitations and Other (See Comments)  . Aleve [Naproxen Sodium] Nausea And Vomiting and Other (See Comments)    GI Upset  . Norpace [Disopyramide Phosphate] Other (See Comments)    Urinary retention  . Amlodipine Besylate Rash  . Azithromycin Rash  . Bactrim [Sulfamethoxazole-Trimethoprim] Rash  . Clinoril [Sulindac] Rash  . Codeine Other (See Comments)    unknown  . Digoxin Rash  . Lorazepam Other (See Comments)    unknown  . Morphine And Related Other (See Comments)    unknown  . Other Other (See Comments)    Tripilenomenon? Per paperwork  . Percocet [Oxycodone-Acetaminophen] Rash  . Sulfonamide Derivatives Rash  . Uloric [Febuxostat] Other (See Comments)    unknown       Follow-up Information    Follow up with Irven Shelling, MD In 1 day.   Specialty:  Internal Medicine   Contact information:   [address] [place] [address] [phone]       Follow up with [facility].   Why:  nurse, physical therapy, aide   Contact information:   [address] [place] [address] [phone]        The results of significant diagnostics from this hospitalization (including imaging, microbiology, ancillary and  laboratory) are listed below for reference.    Significant Diagnostic Studies: Dg Chest 2 View  07/07/2015   CLINICAL DATA:  Week and achy for 2 days.  Leg swelling.  EXAM: CHEST  2 VIEW  COMPARISON:  November 09, 2011, May 05, 2015  FINDINGS: The heart size and mediastinal contours are stable. Patient status post prior CABG and median sternotomy. Cardiac pacemaker is unchanged. The heart size is enlarged. There is a calcified granuloma in the right mid to lung base unchanged since 2012. There is mild central pulmonary vascular congestion. There is no focal pneumonia or pleural effusion. Degenerative joint changes of the spine are noted.  IMPRESSION: Mild central pulmonary vascular congestion.  No focal pneumonia.   Electronically Signed   By: Abelardo Diesel M.D.   On: 07/07/2015 09:42   Ct Head Wo Contrast  07/07/2015   CLINICAL DATA:  Headache, weakness, myalgias for 2 days.  EXAM: CT HEAD WITHOUT CONTRAST  TECHNIQUE: Contiguous axial images were obtained from the base of the skull through the vertex without contrast.  COMPARISON:  05/06/2015  FINDINGS: Stable age related brain atrophy and chronic white matter microvascular ischemic changes. Mild associated ventricular enlargement. No acute intracranial hemorrhage, mass lesion, definite infarction, midline shift, herniation, hydrocephalus, or extra-axial fluid collection. Cisterns are patent. Cerebellar atrophy as well. Atherosclerosis of the intracranial vessels at the skullbase. Mastoids remain clear. Minor sinus mucosal thickening. Intact skull.  IMPRESSION: Stable age related brain atrophy and chronic white matter microvascular ischemic changes.  No interval change or acute process by noncontrast CT.   Electronically Signed   By: Jerilynn Mages.  Shick M.D.   On: 07/07/2015 09:28    Microbiology: Recent Results (from the past 240 hour(s))  Culture, blood (routine x 2)     Status: None (Preliminary result)   Collection Time: 07/07/15 11:23 AM  Result Value Ref  Range Status   Specimen Description BLOOD LEFT FOREARM  Final   Special Requests BOTTLES DRAWN AEROBIC AND ANAEROBIC 5.5 CC EACH  Final   Culture   Final    NO  GROWTH 4 DAYS Performed at [facility]    Report Status PENDING  Incomplete  Culture, blood (routine x 2)     Status: None (Preliminary result)   Collection Time: 07/07/15 12:55 PM  Result Value Ref Range Status   Specimen Description BLOOD RIGHT ARM  Final   Special Requests BOTTLES DRAWN AEROBIC AND ANAEROBIC 10CC  Final   Culture   Final    NO GROWTH 4 DAYS Performed at [facility]    Report Status PENDING  Incomplete  Urine culture     Status: None   Collection Time: 07/08/15  4:28 AM  Result Value Ref Range Status   Specimen Description URINE, RANDOM  Final   Special Requests NONE  Final   Culture   Final    NO GROWTH 1 DAY Performed at [facility]    Report Status 07/09/2015 FINAL  Final     Labs: Basic Metabolic Panel:  Recent Labs Lab 07/07/15 0934  NA 137  K 4.2  CL 101  CO2 29  GLUCOSE 122*  BUN 19  CREATININE 1.24  CALCIUM 9.5   Liver Function Tests:  Recent Labs Lab 07/07/15 0934  AST 26  ALT 16*  ALKPHOS 64  BILITOT 1.1  PROT 7.5  ALBUMIN 3.7   No results for input(s): LIPASE, AMYLASE in the last 168 hours. No results for input(s): AMMONIA in the last 168 hours. CBC:  Recent Labs Lab 07/07/15 0934 07/07/15 1544 07/08/15 0511 07/09/15 0525  WBC 18.8* 19.4* 11.7* 9.6  NEUTROABS 16.2* 15.4*  --   --   HGB 15.6 14.4 13.7 13.9  HCT 47.3 44.8 42.4 42.9  MCV 103.5* 103.9* 104.4* 103.6*  PLT 190 184 170 167   Cardiac Enzymes: No results for input(s): CKTOTAL, CKMB, CKMBINDEX, TROPONINI in the last 168 hours. BNP: BNP (last 3 results) No results for input(s): BNP in the last 8760 hours.  ProBNP (last 3 results) No results for input(s): PROBNP in the last 8760 hours.  CBG:  Recent Labs Lab 07/07/15 1738  GLUCAP 108*        SignedDebbe Odea, MD [facility] 07/12/2015, 8:23 AM

## 2015-07-16 ENCOUNTER — Ambulatory Visit (INDEPENDENT_AMBULATORY_CARE_PROVIDER_SITE_OTHER): Payer: Medicare Other | Admitting: *Deleted

## 2015-07-16 DIAGNOSIS — I495 Sick sinus syndrome: Secondary | ICD-10-CM | POA: Diagnosis not present

## 2015-07-17 DIAGNOSIS — I872 Venous insufficiency (chronic) (peripheral): Secondary | ICD-10-CM

## 2015-07-18 NOTE — Progress Notes (Signed)
Remote pacemaker transmission.   

## 2015-07-24 ENCOUNTER — Encounter: Payer: Self-pay | Admitting: Cardiology

## 2015-07-31 ENCOUNTER — Encounter: Payer: Self-pay | Admitting: Internal Medicine

## 2015-09-12 HISTORY — PX: SQUAMOUS CELL CARCINOMA EXCISION: SHX2433

## 2015-09-16 ENCOUNTER — Ambulatory Visit (INDEPENDENT_AMBULATORY_CARE_PROVIDER_SITE_OTHER): Payer: Medicare Other | Admitting: Internal Medicine

## 2015-09-16 ENCOUNTER — Encounter: Payer: Self-pay | Admitting: Internal Medicine

## 2015-09-16 ENCOUNTER — Encounter: Payer: Medicare Other | Admitting: Internal Medicine

## 2015-09-16 VITALS — BP 116/70 | HR 67 | Ht 62.0 in | Wt 195.6 lb

## 2015-09-16 DIAGNOSIS — L03116 Cellulitis of left lower limb: Secondary | ICD-10-CM | POA: Diagnosis not present

## 2015-09-16 DIAGNOSIS — I5042 Chronic combined systolic (congestive) and diastolic (congestive) heart failure: Secondary | ICD-10-CM

## 2015-09-16 DIAGNOSIS — Z950 Presence of cardiac pacemaker: Secondary | ICD-10-CM | POA: Diagnosis not present

## 2015-09-16 DIAGNOSIS — L03115 Cellulitis of right lower limb: Secondary | ICD-10-CM

## 2015-09-16 DIAGNOSIS — I482 Chronic atrial fibrillation, unspecified: Secondary | ICD-10-CM

## 2015-09-16 LAB — CUP PACEART INCLINIC DEVICE CHECK
Implantable Lead Implant Date: 20111025
Implantable Lead Location: 753859
Lead Channel Pacing Threshold Amplitude: 0.375 V
Lead Channel Pacing Threshold Pulse Width: 0.4 ms
Lead Channel Sensing Intrinsic Amplitude: 12 mV
Lead Channel Setting Pacing Pulse Width: 0.4 ms
MDC IDC LEAD IMPLANT DT: 20111025
MDC IDC LEAD LOCATION: 753860
MDC IDC MSMT BATTERY REMAINING LONGEVITY: 109.2
MDC IDC MSMT BATTERY VOLTAGE: 2.92 V
MDC IDC MSMT LEADCHNL RA IMPEDANCE VALUE: 375 Ohm
MDC IDC MSMT LEADCHNL RA PACING THRESHOLD AMPLITUDE: 0.75 V
MDC IDC MSMT LEADCHNL RA PACING THRESHOLD PULSEWIDTH: 0.4 ms
MDC IDC MSMT LEADCHNL RA SENSING INTR AMPL: 1 mV
MDC IDC MSMT LEADCHNL RV IMPEDANCE VALUE: 850 Ohm
MDC IDC PG SERIAL: 7178130
MDC IDC SESS DTM: 20161107150245
MDC IDC SET LEADCHNL RA PACING AMPLITUDE: 2 V
MDC IDC SET LEADCHNL RV PACING AMPLITUDE: 0.625
MDC IDC SET LEADCHNL RV SENSING SENSITIVITY: 2 mV
MDC IDC STAT BRADY RA PERCENT PACED: 0.03 %
MDC IDC STAT BRADY RV PERCENT PACED: 80 %
Pulse Gen Model: 2210

## 2015-09-16 NOTE — Assessment & Plan Note (Signed)
He is now chronically in atrial fib and his ventricular rate is controlled. Will recheck in several months.

## 2015-09-16 NOTE — Assessment & Plan Note (Signed)
He has class 2 symptoms. He has done a nice job avoiding sodium. No change in his meds.

## 2015-09-16 NOTE — Progress Notes (Signed)
HPI [name] returns today for followup. He is a very pleasant 81 yo man with a history of symptomatic tachybradycardia syndrome , status post permanent pacemaker insertion. He now has chronic atrial fibrillation. Despite this, he is stable. He denies palpitations, chest pain, or syncope. His medications have been adjusted. His heart failure symptoms are class II. His main problem is chronic venous insufficiency and the propensity for cellulitis. He denies fever or chills. No syncope. He has received IV anti-biotics twice since his last visit. Allergies  Allergen Reactions  . Avelox [Moxifloxacin Hcl In Nacl] Other (See Comments)    Messed up lungs  . Indomethacin Other (See Comments)    Renal insufficiency  . Levaquin [Levofloxacin] Other (See Comments)    Messed up lungs  . Lipitor [Atorvastatin] Nausea Only, Palpitations and Other (See Comments)    Irregular heart beat also  . Pravachol [Pravastatin Sodium] Nausea Only, Palpitations and Other (See Comments)  . Aleve [Naproxen Sodium] Nausea And Vomiting and Other (See Comments)    GI Upset  . Norpace [Disopyramide Phosphate] Other (See Comments)    Urinary retention  . Amlodipine Besylate Rash  . Azithromycin Rash  . Bactrim [Sulfamethoxazole-Trimethoprim] Rash  . Clinoril [Sulindac] Rash  . Codeine Other (See Comments)    unknown  . Digoxin Rash  . Lorazepam Other (See Comments)    unknown  . Morphine And Related Other (See Comments)    unknown  . Other Other (See Comments)    Tripilenomenon? Per paperwork  . Percocet [Oxycodone-Acetaminophen] Rash  . Sulfonamide Derivatives Rash  . Uloric [Febuxostat] Other (See Comments)    unknown     Current Outpatient Prescriptions  Medication Sig Dispense Refill  . acetaminophen (TYLENOL) 500 MG tablet Take 500 mg by mouth at bedtime.     Marland Kitchen allopurinol (ZYLOPRIM) 300 MG tablet Take 300 mg by mouth daily.     Marland Kitchen ALPRAZolam (XANAX) 0.5 MG tablet Take 0.5 mg by mouth at bedtime. For  sleep    . aspirin 81 MG tablet Take 81 mg by mouth every other day.     Marland Kitchen atenolol (TENORMIN) 50 MG tablet Take 50 mg by mouth daily.     . Cholecalciferol 2000 UNITS CAPS Take 4,000 Units by mouth daily.    . colchicine 0.6 MG tablet Take 0.6 mg by mouth daily as needed (gout).     Marland Kitchen doxycycline (VIBRA-TABS) 100 MG tablet Take 1 tablet (100 mg total) by mouth 2 (two) times daily. 14 tablet 0  . furosemide (LASIX) 80 MG tablet Take 80 mg by mouth daily.    Marland Kitchen gabapentin (NEURONTIN) 100 MG capsule Take 100 mg by mouth 3 (three) times daily. Takes at 0900,1700,1900 daily    . meclizine (ANTIVERT) 12.5 MG tablet Take 12.5 mg by mouth 3 (three) times daily as needed for dizziness.    . metolazone (ZAROXOLYN) 2.5 MG tablet 2.5 mg daily as needed for fluid retention. Take 30 minutes before takin Furosemide as needed  1  . nitroGLYCERIN (NITROSTAT) 0.4 MG SL tablet Place 0.4 mg under the tongue every 5 (five) minutes as needed. For chest pain    . potassium chloride SA (K-DUR,KLOR-CON) 20 MEQ tablet Take 20 mEq by mouth 3 (three) times daily.     Marland Kitchen saccharomyces boulardii (FLORASTOR) 250 MG capsule Take 1 capsule (250 mg total) by mouth 2 (two) times daily. 15 capsule 0  . terbinafine (LAMISIL) 250 MG tablet Take 1 tablet (250 mg total) by mouth daily. [place]  tablet 5  . warfarin (COUMADIN) 2.5 MG tablet Take 2.5 mg by mouth daily at 6 PM.      No current facility-administered medications for this visit.     Past Medical History  Diagnosis Date  . Hypertension   . CHF (congestive heart failure) ([place])   . Arrhythmia     afib  . Neuropathy ([place])   . Cancer ([place])     lymphoma  . Heart attack ([place]) 1986  . ED (erectile dysfunction)   . Coronary artery disease   . Non Hodgkin's lymphoma ([place])   . PE (pulmonary embolism)   . GERD (gastroesophageal reflux disease)   . Hyperlipidemia   . Gout   . Peptic ulcer disease   . DJD (degenerative joint disease)   . PAC (premature atrial contraction)   .  CVI (common variable immunodeficiency) ([place])   . Venous stasis ulcers ([place])   . IFG (impaired fasting glucose)     ROS:   All systems reviewed and negative except as noted in the HPI.   Past Surgical History  Procedure Laterality Date  . Pacemaker insertion  08/2010  . Cardiac surgery    . Eye surgery  10/2006, 11/2006  . Back and knee surgeries  unknown  . Tonsillectomy  1972  . Nasal sinus surgery  1980  . Coronary artery bypass graft  1990  . Elbow surgery      r/t gout     Family History  Problem Relation Age of Onset  . Diabetes type II Sister   . Heart disease Brother   . Diabetes type II Daughter      Social History   Social History  . Marital Status: Widowed    Spouse Name: N/A  . Number of Children: N/A  . Years of Education: N/A   Occupational History  . Not on file.   Social History Main Topics  . Smoking status: Former Smoker -- 1.00 packs/day for 15 years    Types: Cigarettes    Quit date: 11/13/1944  . Smokeless tobacco: Never Used  . Alcohol Use: No  . Drug Use: No  . Sexual Activity: Yes    Birth Control/ Protection: Diaphragm   Other Topics Concern  . Not on file   Social History Narrative     BP 116/70 mmHg  Pulse 67  Ht 5\' 2"  (1.575 m)  Wt 195 lb 9.6 oz (88.724 kg)  BMI 35.77 kg/m2  SpO2 97%  Physical Exam:  Well appearing elderly man, NAD HEENT: Unremarkable Neck:  6 cm JVD, no thyromegally Lungs:  Clear with no wheezes, rales, or rhonchi. HEART:  Regular rate rhythm, no murmurs, no rubs, no clicks Abd:  soft, positive bowel sounds, no organomegally, no rebound, no guarding Ext:  2 plus pulses, 2+ edema, legs are wrapped, no cyanosis, no clubbing Skin:  No rashes no nodules Neuro:  CN II through XII intact, motor grossly intact   DEVICE  Normal device function.  See PaceArt for details.   Assess/Plan:

## 2015-09-16 NOTE — Patient Instructions (Signed)
Medication Instructions:  Your physician recommends that you continue on your current medications as directed. Please refer to the Current Medication list given to you today.  Labwork: None  ordered  Testing/Procedures: None ordered  Follow-Up: Remote monitoring is used to monitor your Pacemaker of ICD from home. This monitoring reduces the number of office visits required to check your device to one time per year. It allows [place] to keep an eye on the functioning of your device to ensure it is working properly. You are scheduled for a device check from home on 12/15/14. You may send your transmission at any time that day. If you have a wireless device, the transmission will be sent automatically. After your physician reviews your transmission, you will receive a postcard with your next transmission date.  Your physician wants you to follow-up in: 1 year with Dr. Lovena Le.  You will receive a reminder letter in the mail two months in advance. If you don't receive a letter, please call our office to schedule the follow-up appointment.   Any Other Special Instructions Will Be Listed Below (If Applicable).  If you need a refill on your cardiac medications before your next appointment, please call your pharmacy.  Thank you for choosing [facility]!!

## 2015-09-16 NOTE — Assessment & Plan Note (Signed)
His [employer] Jude DDD PM is now programmed DDIR. Will plan to recheck in several months.

## 2015-09-16 NOTE — Assessment & Plan Note (Signed)
We discussed the importance of a low sodium diet.

## 2015-09-26 ENCOUNTER — Encounter (HOSPITAL_COMMUNITY): Payer: Self-pay | Admitting: Emergency Medicine

## 2015-09-26 ENCOUNTER — Observation Stay (HOSPITAL_COMMUNITY)
Admission: EM | Admit: 2015-09-26 | Discharge: 2015-09-27 | Disposition: A | Discharge status: Home / Self Care | Payer: Medicare Other | Attending: Internal Medicine | Admitting: Internal Medicine

## 2015-09-26 ENCOUNTER — Emergency Department (HOSPITAL_COMMUNITY): Payer: Medicare Other

## 2015-09-26 DIAGNOSIS — I252 Old myocardial infarction: Secondary | ICD-10-CM | POA: Diagnosis not present

## 2015-09-26 DIAGNOSIS — Z886 Allergy status to analgesic agent status: Secondary | ICD-10-CM | POA: Diagnosis not present

## 2015-09-26 DIAGNOSIS — Z7982 Long term (current) use of aspirin: Secondary | ICD-10-CM | POA: Diagnosis not present

## 2015-09-26 DIAGNOSIS — R05 Cough: Secondary | ICD-10-CM | POA: Diagnosis present

## 2015-09-26 DIAGNOSIS — Z8572 Personal history of non-Hodgkin lymphomas: Secondary | ICD-10-CM | POA: Insufficient documentation

## 2015-09-26 DIAGNOSIS — M10079 Idiopathic gout, unspecified ankle and foot: Secondary | ICD-10-CM | POA: Diagnosis not present

## 2015-09-26 DIAGNOSIS — Z885 Allergy status to narcotic agent status: Secondary | ICD-10-CM | POA: Diagnosis not present

## 2015-09-26 DIAGNOSIS — Z888 Allergy status to other drugs, medicaments and biological substances status: Secondary | ICD-10-CM | POA: Diagnosis not present

## 2015-09-26 DIAGNOSIS — M109 Gout, unspecified: Secondary | ICD-10-CM | POA: Insufficient documentation

## 2015-09-26 DIAGNOSIS — Z86711 Personal history of pulmonary embolism: Secondary | ICD-10-CM | POA: Diagnosis not present

## 2015-09-26 DIAGNOSIS — I482 Chronic atrial fibrillation, unspecified: Secondary | ICD-10-CM | POA: Diagnosis present

## 2015-09-26 DIAGNOSIS — D839 Common variable immunodeficiency, unspecified: Secondary | ICD-10-CM | POA: Insufficient documentation

## 2015-09-26 DIAGNOSIS — Z79899 Other long term (current) drug therapy: Secondary | ICD-10-CM | POA: Diagnosis not present

## 2015-09-26 DIAGNOSIS — K219 Gastro-esophageal reflux disease without esophagitis: Secondary | ICD-10-CM | POA: Diagnosis not present

## 2015-09-26 DIAGNOSIS — E785 Hyperlipidemia, unspecified: Secondary | ICD-10-CM | POA: Diagnosis not present

## 2015-09-26 DIAGNOSIS — I2510 Atherosclerotic heart disease of native coronary artery without angina pectoris: Secondary | ICD-10-CM | POA: Diagnosis not present

## 2015-09-26 DIAGNOSIS — R059 Cough, unspecified: Secondary | ICD-10-CM

## 2015-09-26 DIAGNOSIS — Z87891 Personal history of nicotine dependence: Secondary | ICD-10-CM | POA: Insufficient documentation

## 2015-09-26 DIAGNOSIS — Z7901 Long term (current) use of anticoagulants: Secondary | ICD-10-CM | POA: Insufficient documentation

## 2015-09-26 DIAGNOSIS — N529 Male erectile dysfunction, unspecified: Secondary | ICD-10-CM | POA: Diagnosis not present

## 2015-09-26 DIAGNOSIS — I878 Other specified disorders of veins: Secondary | ICD-10-CM | POA: Insufficient documentation

## 2015-09-26 DIAGNOSIS — I491 Atrial premature depolarization: Secondary | ICD-10-CM | POA: Diagnosis not present

## 2015-09-26 DIAGNOSIS — I110 Hypertensive heart disease with heart failure: Secondary | ICD-10-CM | POA: Insufficient documentation

## 2015-09-26 DIAGNOSIS — D72829 Elevated white blood cell count, unspecified: Secondary | ICD-10-CM

## 2015-09-26 DIAGNOSIS — Z881 Allergy status to other antibiotic agents status: Secondary | ICD-10-CM | POA: Insufficient documentation

## 2015-09-26 DIAGNOSIS — I4891 Unspecified atrial fibrillation: Principal | ICD-10-CM | POA: Insufficient documentation

## 2015-09-26 DIAGNOSIS — G629 Polyneuropathy, unspecified: Secondary | ICD-10-CM | POA: Insufficient documentation

## 2015-09-26 DIAGNOSIS — R509 Fever, unspecified: Secondary | ICD-10-CM | POA: Diagnosis not present

## 2015-09-26 DIAGNOSIS — M1990 Unspecified osteoarthritis, unspecified site: Secondary | ICD-10-CM | POA: Insufficient documentation

## 2015-09-26 DIAGNOSIS — R6883 Chills (without fever): Secondary | ICD-10-CM

## 2015-09-26 DIAGNOSIS — Z882 Allergy status to sulfonamides status: Secondary | ICD-10-CM | POA: Insufficient documentation

## 2015-09-26 HISTORY — DX: Basal cell carcinoma of skin, unspecified: C44.91

## 2015-09-26 HISTORY — DX: Pneumonia, unspecified organism: J18.9

## 2015-09-26 HISTORY — DX: Unspecified osteoarthritis, unspecified site: M19.90

## 2015-09-26 HISTORY — DX: Reserved for concepts with insufficient information to code with codable children: IMO0002

## 2015-09-26 LAB — COMPREHENSIVE METABOLIC PANEL
ALT: 15 U/L — ABNORMAL LOW (ref 17–63)
ANION GAP: 8 (ref 5–15)
AST: 27 U/L (ref 15–41)
Albumin: 3.3 g/dL — ABNORMAL LOW (ref 3.5–5.0)
Alkaline Phosphatase: 66 U/L (ref 38–126)
BUN: 18 mg/dL (ref 6–20)
CHLORIDE: 100 mmol/L — AB (ref 101–111)
CO2: 28 mmol/L (ref 22–32)
Calcium: 9.5 mg/dL (ref 8.9–10.3)
Creatinine, Ser: 1.13 mg/dL (ref 0.61–1.24)
GFR, EST NON AFRICAN AMERICAN: 55 mL/min — AB (ref >60)
Glucose, Bld: 118 mg/dL — ABNORMAL HIGH (ref 65–99)
Potassium: 3.8 mmol/L (ref 3.5–5.1)
SODIUM: 136 mmol/L (ref 135–145)
Total Bilirubin: 1.3 mg/dL — ABNORMAL HIGH (ref 0.3–1.2)
Total Protein: 7.5 g/dL (ref 6.5–8.1)

## 2015-09-26 LAB — URINALYSIS, ROUTINE W REFLEX MICROSCOPIC
BILIRUBIN URINE: NEGATIVE
Glucose, UA: NEGATIVE mg/dL
Ketones, ur: NEGATIVE mg/dL
Leukocytes, UA: NEGATIVE
NITRITE: NEGATIVE
Protein, ur: NEGATIVE mg/dL
SPECIFIC GRAVITY, URINE: 1.015 (ref 1.005–1.030)
pH: 7 (ref 5.0–8.0)

## 2015-09-26 LAB — CBC WITH DIFFERENTIAL/PLATELET
BASOS PCT: 0 %
Basophils Absolute: 0 10*3/uL (ref 0.0–0.1)
EOS ABS: 0.1 10*3/uL (ref 0.0–0.7)
Eosinophils Relative: 1 %
HEMATOCRIT: 45.6 % (ref 39.0–52.0)
HEMOGLOBIN: 15 g/dL (ref 13.0–17.0)
LYMPHS ABS: 2.4 10*3/uL (ref 0.7–4.0)
Lymphocytes Relative: 16 %
MCH: 33.9 pg (ref 26.0–34.0)
MCHC: 32.9 g/dL (ref 30.0–36.0)
MCV: 103.2 fL — ABNORMAL HIGH (ref 78.0–100.0)
MONOS PCT: 8 %
Monocytes Absolute: 1.2 10*3/uL — ABNORMAL HIGH (ref 0.1–1.0)
NEUTROS ABS: 11.9 10*3/uL — AB (ref 1.7–7.7)
NEUTROS PCT: 75 %
Platelets: 162 10*3/uL (ref 150–400)
RBC: 4.42 MIL/uL (ref 4.22–5.81)
RDW: 14.4 % (ref 11.5–15.5)
WBC: 15.6 10*3/uL — AB (ref 4.0–10.5)

## 2015-09-26 LAB — URINE MICROSCOPIC-ADD ON: BACTERIA UA: NONE SEEN

## 2015-09-26 LAB — I-STAT CG4 LACTIC ACID, ED: Lactic Acid, Venous: 1.41 mmol/L (ref 0.5–2.0)

## 2015-09-26 LAB — PROTIME-INR
INR: 2.49 — AB (ref 0.00–1.49)
PROTHROMBIN TIME: 26.6 s — AB (ref 11.6–15.2)

## 2015-09-26 LAB — TROPONIN I: Troponin I: 0.04 ng/mL — ABNORMAL HIGH (ref <0.031)

## 2015-09-26 LAB — URIC ACID: URIC ACID, SERUM: 5 mg/dL (ref 4.4–7.6)

## 2015-09-26 MED ORDER — POTASSIUM CHLORIDE CRYS ER 20 MEQ PO TBCR
60.0000 meq | EXTENDED_RELEASE_TABLET | Freq: Every day | ORAL | Status: DC
  Administered 2015-09-27: 60 meq via ORAL
  Filled 2015-09-26: qty 3

## 2015-09-26 MED ORDER — ALLOPURINOL 300 MG PO TABS
300.0000 mg | ORAL_TABLET | Freq: Every day | ORAL | Status: DC
  Administered 2015-09-26 – 2015-09-27 (×2): 300 mg via ORAL
  Filled 2015-09-26 (×2): qty 1

## 2015-09-26 MED ORDER — ACETAMINOPHEN 650 MG RE SUPP
650.0000 mg | Freq: Four times a day (QID) | RECTAL | Status: DC | PRN
Start: 2015-09-26 — End: 2015-09-27

## 2015-09-26 MED ORDER — SACCHAROMYCES BOULARDII 250 MG PO CAPS
250.0000 mg | ORAL_CAPSULE | Freq: Two times a day (BID) | ORAL | Status: DC
  Administered 2015-09-26 – 2015-09-27 (×2): 250 mg via ORAL
  Filled 2015-09-26 (×2): qty 1

## 2015-09-26 MED ORDER — GABAPENTIN 100 MG PO CAPS
100.0000 mg | ORAL_CAPSULE | Freq: Three times a day (TID) | ORAL | Status: DC
  Administered 2015-09-26 – 2015-09-27 (×3): 100 mg via ORAL
  Filled 2015-09-26 (×3): qty 1

## 2015-09-26 MED ORDER — ONDANSETRON HCL 4 MG PO TABS: 4.0000 mg | ORAL_TABLET | Freq: Four times a day (QID) | ORAL | Status: DC | PRN

## 2015-09-26 MED ORDER — VITAMIN D 1000 UNITS PO TABS
4000.0000 [IU] | ORAL_TABLET | Freq: Every day | ORAL | Status: DC
  Administered 2015-09-27: 4000 [IU] via ORAL
  Filled 2015-09-26 (×2): qty 4

## 2015-09-26 MED ORDER — WARFARIN - PHARMACIST DOSING INPATIENT: Freq: Every day | Status: DC

## 2015-09-26 MED ORDER — ALPRAZOLAM 0.5 MG PO TABS
0.5000 mg | ORAL_TABLET | Freq: Every day | ORAL | Status: DC
  Administered 2015-09-26: 0.5 mg via ORAL
  Filled 2015-09-26: qty 1

## 2015-09-26 MED ORDER — NITROGLYCERIN 0.4 MG SL SUBL: 0.4000 mg | SUBLINGUAL_TABLET | SUBLINGUAL | Status: DC | PRN

## 2015-09-26 MED ORDER — ASPIRIN EC 81 MG PO TBEC
81.0000 mg | DELAYED_RELEASE_TABLET | ORAL | Status: DC
  Administered 2015-09-26: 81 mg via ORAL
  Filled 2015-09-26 (×2): qty 1

## 2015-09-26 MED ORDER — ACETAMINOPHEN 325 MG PO TABS: 650.0000 mg | ORAL_TABLET | Freq: Four times a day (QID) | ORAL | Status: DC | PRN

## 2015-09-26 MED ORDER — WARFARIN SODIUM 5 MG PO TABS
2.5000 mg | ORAL_TABLET | Freq: Once | ORAL | Status: AC
  Administered 2015-09-26: 2.5 mg via ORAL
  Filled 2015-09-26: qty 1

## 2015-09-26 MED ORDER — FUROSEMIDE 80 MG PO TABS
80.0000 mg | ORAL_TABLET | Freq: Every day | ORAL | Status: DC
  Administered 2015-09-27: 80 mg via ORAL
  Filled 2015-09-26: qty 1

## 2015-09-26 MED ORDER — ONDANSETRON HCL 4 MG/2ML IJ SOLN: 4.0000 mg | Freq: Four times a day (QID) | INTRAMUSCULAR | Status: DC | PRN

## 2015-09-26 MED ORDER — ATENOLOL 50 MG PO TABS
50.0000 mg | ORAL_TABLET | Freq: Every day | ORAL | Status: DC
  Administered 2015-09-26 – 2015-09-27 (×2): 50 mg via ORAL
  Filled 2015-09-26 (×2): qty 1

## 2015-09-26 NOTE — ED Notes (Signed)
Patient transported to X-ray 

## 2015-09-26 NOTE — Progress Notes (Signed)
ANTICOAGULATION CONSULT NOTE - Initial Consult  Pharmacy Consult for Coumadin  Indication: h/o atrial fibrillation  Allergies  Allergen Reactions  . Avelox [Moxifloxacin Hcl In Nacl] Other (See Comments)    Messed up lungs  . Indomethacin Other (See Comments)    Renal insufficiency  . Levaquin [Levofloxacin] Other (See Comments)    Messed up lungs  . Lipitor [Atorvastatin] Nausea Only, Palpitations and Other (See Comments)    Irregular heart beat also  . Pravachol [Pravastatin Sodium] Nausea Only, Palpitations and Other (See Comments)  . Aleve [Naproxen Sodium] Nausea And Vomiting and Other (See Comments)    GI Upset  . Norpace [Disopyramide Phosphate] Other (See Comments)    Urinary retention  . Amlodipine Besylate Rash  . Azithromycin Rash  . Bactrim [Sulfamethoxazole-Trimethoprim] Rash  . Clinoril [Sulindac] Rash  . Codeine Other (See Comments)    unknown  . Digoxin Rash  . Lorazepam Other (See Comments)    unknown  . Morphine And Related Other (See Comments)    unknown  . Other Other (See Comments)    Tripilenomenon? Per paperwork  . Percocet [Oxycodone-Acetaminophen] Rash  . Sulfonamide Derivatives Rash  . Uloric [Febuxostat] Other (See Comments)    unknown    Patient Measurements: Height: 5 ft 2 inches Weight: 88.7 kg  Vital Signs: Temp: 98.2 F (36.8 C) (11/17 1731) Temp Source: Oral (11/17 1731) BP: 130/66 mmHg (11/17 1731) Pulse Rate: 66 (11/17 1731)  Labs:  Recent Labs  09/26/15 0916  HGB 15.0  HCT 45.6  PLT 162  LABPROT 26.6*  INR 2.49*  CREATININE 1.13    Estimated Creatinine Clearance: 41.9 mL/min (by C-G formula based on Cr of 1.13).   Medical History: Past Medical History  Diagnosis Date  . Hypertension   . CHF (congestive heart failure) ([place])   . Arrhythmia     afib  . Neuropathy ([place])   . Cancer ([place])     lymphoma  . Heart attack ([place]) 1986  . ED (erectile dysfunction)   . Coronary artery disease   . Non Hodgkin's lymphoma  ([place])   . PE (pulmonary embolism)   . GERD (gastroesophageal reflux disease)   . Hyperlipidemia   . Gout   . Peptic ulcer disease   . DJD (degenerative joint disease)   . PAC (premature atrial contraction)   . CVI (common variable immunodeficiency) ([place])   . Venous stasis ulcers ([place])   . IFG (impaired fasting glucose)     Medications:  Prescriptions prior to admission  Medication Sig Dispense Refill Last Dose  . acetaminophen (TYLENOL) 500 MG tablet Take 500 mg by mouth at bedtime.    09/25/2015 at Unknown time  . allopurinol (ZYLOPRIM) 300 MG tablet Take 300 mg by mouth daily.    09/25/2015 at Unknown time  . ALPRAZolam (XANAX) 0.5 MG tablet Take 0.5 mg by mouth at bedtime. For sleep   09/25/2015 at Unknown time  . aspirin 81 MG tablet Take 81 mg by mouth every other day.    Past Week at Unknown time  . atenolol (TENORMIN) 50 MG tablet Take 50 mg by mouth daily.    09/25/2015 at Unknown time  . Cholecalciferol 2000 UNITS CAPS Take 4,000 Units by mouth daily.   09/25/2015 at Unknown time  . colchicine 0.6 MG tablet Take 0.6 mg by mouth daily as needed (gout).    09/25/2015 at Unknown time  . furosemide (LASIX) 80 MG tablet Take 80 mg by mouth daily.   09/25/2015 at  Unknown time  . gabapentin (NEURONTIN) 100 MG capsule Take 100 mg by mouth 3 (three) times daily. Takes at 0900,1700,1900 daily   09/25/2015 at Unknown time  . metolazone (ZAROXOLYN) 2.5 MG tablet TAKE 1 TABLET BY MOUTH ONCE WEEKLY  1 Past Week at Unknown time  . nitroGLYCERIN (NITROSTAT) 0.4 MG SL tablet Place 0.4 mg under the tongue every 5 (five) minutes as needed. For chest pain   unk at unk  . potassium chloride SA (K-DUR,KLOR-CON) 20 MEQ tablet Take 60 mEq by mouth daily.    09/25/2015 at Unknown time  . saccharomyces boulardii (FLORASTOR) 250 MG capsule Take 1 capsule (250 mg total) by mouth 2 (two) times daily. 15 capsule 0 09/25/2015 at Unknown time  . warfarin (COUMADIN) 2.5 MG tablet Take 2.5 mg by mouth daily at 6  PM.    09/25/2015 at Unknown time   Scheduled:  . allopurinol  300 mg Oral Daily  . ALPRAZolam  0.5 mg Oral QHS  . aspirin EC  81 mg Oral QODAY  . atenolol  50 mg Oral Daily  . cholecalciferol  4,000 Units Oral Daily  . furosemide  80 mg Oral Daily  . gabapentin  100 mg Oral TID  . potassium chloride SA  60 mEq Oral Daily  . saccharomyces boulardii  250 mg Oral BID    Assessment: 81 y.o male presents to [facility]15 with fever (Tm 103 at home, Tm 100.4 in ED).  He is taking coumadin PTA for h/o Afib. Also has h/o non-hodgkin lymphona and other PMH as noted above.  Recent h/o cellulitis (Aug. 2016) PTA coumadin dose was 2.5 mg daily, last taken yesterday 09/25/15.  Today the INR =2.49, therapeutic.  Hgb 15.0 and PLTC 162K No bleeding noted.   Goal of Therapy:  INR 2-3 Monitor platelets by anticoagulation protocol: Yes   Plan:  Coumadin 2.5mg  po today x1 PT/INR daily  Thank you for allowing pharmacy to be part of this patients care team. Nicole Cella, [place] Pharmacist Pager: [phone] 09/26/2015,5:44 PM

## 2015-09-26 NOTE — ED Notes (Addendum)
Pt arrives via [facility] from home with c/o a fever that pt reports began over night. Pt reports he checked his temp this morning and got 103 orally. Pt reports he has diagnosed cellulitis on lower left leg and recently had skin cancers removed from rt cheek and left forearm. Pt alert and oriented, vss. NAD

## 2015-09-26 NOTE — ED Notes (Signed)
MD at bedside. 

## 2015-09-26 NOTE — ED Notes (Signed)
Lab contacted in regards to status of urinalysis result, Joelene Millin in lab advised they would have the results in the computer within the next few minutes.

## 2015-09-26 NOTE — ED Notes (Signed)
Dr. Estanislado Spire made aware of patient's rectal temperature of 100.4

## 2015-09-26 NOTE — ED Provider Notes (Signed)
CSN: [id number]     Arrival date & time 09/26/15  0845 History   First MD Initiated Contact with Patient 09/26/15 [phone]     Chief Complaint  Patient presents with  . Fever     (Consider location/radiation/quality/duration/timing/severity/associated sxs/prior Treatment) Patient is a 82 y.o. male presenting with fever. The history is provided by the patient and a relative.  Fever Max temp prior to arrival:  103 Temp source:  Oral Severity:  Moderate Onset quality:  Gradual Duration:  4 hours Timing:  Intermittent Progression:  Waxing and waning Chronicity:  Recurrent Relieved by:  None tried Worsened by:  Nothing tried Ineffective treatments:  None tried Associated symptoms: chills, headaches and rash   Associated symptoms: no chest pain, no confusion, no congestion, no cough, no diarrhea, no dysuria, no myalgias and no nausea   Risk factors: hx of cancer   Risk factors: no immunosuppression, no recent travel and no sick contacts     Past Medical History  Diagnosis Date  . Hypertension   . CHF (congestive heart failure) ([place])   . Arrhythmia     afib  . Neuropathy ([place])   . ED (erectile dysfunction)   . Coronary artery disease   . PE (pulmonary embolism) 2007    "when I was taking chemo"  . GERD (gastroesophageal reflux disease)   . Hyperlipidemia   . Gout   . Peptic ulcer disease   . PAC (premature atrial contraction)   . CVI (common variable immunodeficiency) ([place])   . Venous stasis ulcers ([place])   . IFG (impaired fasting glucose)   . Heart attack ([place]) 1986  . Pneumonia ~ 2007 X 2  . DJD (degenerative joint disease)   . Arthritis     "back and hands" (09/26/2015)  . Non Hodgkin's lymphoma ([place]) dx'd 2006  . Squamous carcinoma ([place])   . Basal cell carcinoma    Past Surgical History  Procedure Laterality Date  . Cardiac catheterization  1990  . Nasal sinus surgery  1980    "related to bacteria infection in sinus"  . Coronary artery bypass graft  1990     CABG X3  . Elbow surgery Left     "related to gout"  . Squamous cell carcinoma excision Right 09/12/2015    cheek  . Insert / replace / remove pacemaker  08/2010  . Tonsillectomy  1972  . Back surgery    . Lumbar disc surgery      "had pinched nerve; had to make room for it"  . Knee arthroscopy Left   . Cataract extraction w/ intraocular lens  implant, bilateral Bilateral 10/2006 ~ 11/2006  . Coronary angioplasty     Family History  Problem Relation Age of Onset  . Diabetes type II Sister   . Heart disease Brother   . Diabetes type II Daughter    Social History  Substance Use Topics  . Smoking status: Former Smoker -- 1.00 packs/day for 1 years    Types: Cigarettes  . Smokeless tobacco: Never Used  . Alcohol Use: No    Review of Systems  Constitutional: Positive for fever, chills and fatigue. Negative for diaphoresis, activity change, appetite change and unexpected weight change.  HENT: Negative for congestion and facial swelling.   Respiratory: Negative for cough and shortness of breath.   Cardiovascular: Negative for chest pain.  Gastrointestinal: Negative for nausea, abdominal pain and diarrhea.  Genitourinary: Negative for dysuria.  Musculoskeletal: Negative for myalgias and back pain.  Skin: Positive  for rash.  Neurological: Positive for weakness and headaches. Negative for dizziness, tremors, seizures, syncope, facial asymmetry, speech difficulty, light-headedness and numbness.  Psychiatric/Behavioral: Negative for confusion.      Allergies  Avelox; Indomethacin; Levaquin; Lipitor; Pravachol; Aleve; Norpace; Amlodipine besylate; Azithromycin; Bactrim; Clinoril; Codeine; Digoxin; Lorazepam; Morphine and related; Other; Percocet; Sulfonamide derivatives; and Uloric  Home Medications   Prior to Admission medications   Medication Sig Start Date End Date Taking? Authorizing Provider  acetaminophen (TYLENOL) 500 MG tablet Take 500 mg by mouth at bedtime.    Yes  Historical Provider, MD  allopurinol (ZYLOPRIM) 300 MG tablet Take 300 mg by mouth daily.    Yes Historical Provider, MD  ALPRAZolam Duanne Moron) 0.5 MG tablet Take 0.5 mg by mouth at bedtime. For sleep   Yes Historical Provider, MD  aspirin 81 MG tablet Take 81 mg by mouth every other day.    Yes Historical Provider, MD  atenolol (TENORMIN) 50 MG tablet Take 50 mg by mouth daily.    Yes Historical Provider, MD  Cholecalciferol 2000 UNITS CAPS Take 4,000 Units by mouth daily.   Yes Historical Provider, MD  colchicine 0.6 MG tablet Take 0.6 mg by mouth daily as needed (gout).    Yes Historical Provider, MD  furosemide (LASIX) 80 MG tablet Take 80 mg by mouth daily.   Yes Historical Provider, MD  gabapentin (NEURONTIN) 100 MG capsule Take 100 mg by mouth 3 (three) times daily. Takes at 0900,1700,1900 daily   Yes Historical Provider, MD  metolazone (ZAROXOLYN) 2.5 MG tablet TAKE 1 TABLET BY MOUTH ONCE WEEKLY 04/22/15  Yes Historical Provider, MD  nitroGLYCERIN (NITROSTAT) 0.4 MG SL tablet Place 0.4 mg under the tongue every 5 (five) minutes as needed. For chest pain   Yes Historical Provider, MD  potassium chloride SA (K-DUR,KLOR-CON) 20 MEQ tablet Take 60 mEq by mouth daily.    Yes Historical Provider, MD  saccharomyces boulardii (FLORASTOR) 250 MG capsule Take 1 capsule (250 mg total) by mouth 2 (two) times daily. 07/09/15  Yes Debbe Odea, MD  warfarin (COUMADIN) 2.5 MG tablet Take 2.5 mg by mouth daily at 6 PM.    Yes Historical Provider, MD   BP 147/80 mmHg  Pulse 80 Temp(Src) 100.4 F (38.0 C) (Rectal)  Resp 20 SpO2 98% Physical Exam  Constitutional: He is oriented to person, place, and time. He appears well-developed and well-nourished. No distress.  HENT:  Head: Normocephalic and atraumatic.  Right Ear: External ear normal.  Left Ear: External ear normal.  Nose: Nose normal.  Mouth/Throat: Oropharynx is clear and moist. No oropharyngeal exudate.  Eyes: Conjunctivae and EOM are normal.  Pupils are equal, round, and reactive to light. Right eye exhibits no discharge. Left eye exhibits no discharge. No scleral icterus.  Neck: Normal range of motion. Neck supple. No JVD present. No tracheal deviation present. No thyromegaly present.  Cardiovascular: Normal rate, regular rhythm and intact distal pulses.   Pulmonary/Chest: Effort normal. No stridor. No respiratory distress. He has no wheezes. He has no rales. He exhibits no tenderness.  Abdominal: Soft. He exhibits no distension. There is no tenderness.  Musculoskeletal: Normal range of motion. He exhibits no edema or tenderness.  Lymphadenopathy:    He has no cervical adenopathy.  Neurological: He is alert and oriented to person, place, and time.  Skin: Skin is warm and dry. No rash noted. He is not diaphoretic. No erythema. No pallor.  B/L LE hemosiderin. LEs  Jildly warm on both sides but no  signs of cellulitis.  Psychiatric: He has a normal mood and affect. His behavior is normal. Judgment and thought content normal.  Nursing note and vitals reviewed.   ED Course  Procedures (including critical care time) Labs Review Labs Reviewed  COMPREHENSIVE METABOLIC PANEL - Abnormal; Notable for the following:    Chloride 100 (*)    Glucose, Bld 118 (*)    Albumin 3.3 (*)    ALT 15 (*)    Total Bilirubin 1.3 (*)    GFR calc non Af Amer 55 (*)    All other components within normal limits  CBC WITH DIFFERENTIAL/PLATELET - Abnormal; Notable for the following:    WBC 15.6 (*)    MCV 103.2 (*)    Neutro Abs 11.9 (*)    Monocytes Absolute 1.2 (*)    All other components within normal limits  URINALYSIS, ROUTINE W REFLEX MICROSCOPIC (NOT AT [facility]) - Abnormal; Notable for the following:    Hgb urine dipstick SMALL (*)    All other components within normal limits  PROTIME-INR - Abnormal; Notable for the following:    Prothrombin Time 26.6 (*)    INR 2.49 (*)    All other components within normal limits  URINE MICROSCOPIC-ADD ON -  Abnormal; Notable for the following:    Squamous Epithelial / LPF 0-5 (*)    All other components within normal limits  TROPONIN I - Abnormal; Notable for the following:    Troponin I 0.04 (*)    All other components within normal limits  CULTURE, BLOOD (ROUTINE X 2)  CULTURE, BLOOD (ROUTINE X 2)  TROPONIN I  URIC ACID  TECHNOLOGIST SMEAR REVIEW  BASIC METABOLIC PANEL  CBC  TROPONIN I  PROTIME-INR  I-STAT CG4 LACTIC ACID, ED    Imaging Review Dg Chest 2 View  09/26/2015  CLINICAL DATA:  Fever, chills EXAM: CHEST  2 VIEW COMPARISON:  07/07/2015 FINDINGS: Cardiomediastinal silhouette is stable. Status post median sternotomy. Dual lead cardiac pacemaker is unchanged in position. Mild hyperinflation. No acute infiltrate or pulmonary edema. Stable bilateral basilar scarring. Stable compression deformity lower thoracic spine. IMPRESSION: No active cardiopulmonary disease. Stable compression deformity lower thoracic spine. Again noted bilateral basilar scarring. Electronically Signed   By: Lahoma Crocker M.D.   On: 09/26/2015 09:58   I have personally reviewed and evaluated these images and lab results as part of my medical decision-making.    MDM   Final diagnoses:  Chills  Fever, unspecified fever cause   [name] is a 81 y.o. male patient presenting with fever.  Intermittent fevers.  No apparent source of infection.  Currently patient is afebrile in the ED.   Lab w/u shows WBC of 15.6K,, otherwise labs are reassuring.  I discussed having the patient follow up here in the morning, but after further discussion with the patient and the family and opted for observation admission instead.  No abx given in ED as they were not currently indicated.  Patient care was discussed with my attending, Dr. Venora Maples.       Hoyle Sauer, MD 09/27/15 [place], MD 09/27/15 [phone]

## 2015-09-26 NOTE — H&P (Signed)
[facility] History and Physical  [name] [id number] DOB: [birth year] DOA: 09/26/2015  Referring physician: brooten PCP: Irven Shelling, MD   Chief Complaint: fevers  HPI: [name] is a 81 y.o. male who lives at home by himself.  Able to do all of his ADLs.  Has h/o gout, non-hodgkin lymphona, and CVI. Last night he developed chills at 1AM.  He checked his temperature and it was 103.  Recently he has had several cancers removed.  All are healing well.  This type of chill has happened before and he had been diagnosed with cellulitis- most recently August.  His legs are actually healed with no open wounds currently-- home health RN just removed UNNA boot.    In the ER, he had temp of 100.4.  X ray, urinalysis was unremarkable.  WBC count was elevated.    He has a h/o cancer- lymphona (2006)-- treated by Dr. Elnoria Howard.  He also is on coumadin for a fib.    No weight loss, no night sweats, no diarrhea, no cough, no cold-like symptoms, no sinus pain, no   Review of Systems:  All systems reviewed, negative unless stated above    Past Medical History  Diagnosis Date  . Hypertension   . CHF (congestive heart failure) ([place])   . Arrhythmia     afib  . Neuropathy ([place])   . Cancer ([place])     lymphoma  . Heart attack ([place]) 1986  . ED (erectile dysfunction)   . Coronary artery disease   . Non Hodgkin's lymphoma ([place])   . PE (pulmonary embolism)   . GERD (gastroesophageal reflux disease)   . Hyperlipidemia   . Gout   . Peptic ulcer disease   . DJD (degenerative joint disease)   . PAC (premature atrial contraction)   . CVI (common variable immunodeficiency) ([place])   . Venous stasis ulcers ([place])   . IFG (impaired fasting glucose)    Past Surgical History  Procedure Laterality Date  . Pacemaker insertion  08/2010  . Cardiac surgery    . Eye surgery  10/2006, 11/2006  . Back and knee surgeries  unknown  . Tonsillectomy  1972  . Nasal sinus surgery  1980    . Coronary artery bypass graft  1990  . Elbow surgery      r/t gout  . Skin cancer removal Right     rt cheek   Social History:  reports that he quit smoking about 70 years ago. His smoking use included Cigarettes. He has a 15 pack-year smoking history. He has never used smokeless tobacco. He reports that he does not drink alcohol or use illicit drugs.  Allergies  Allergen Reactions  . Avelox [Moxifloxacin Hcl In Nacl] Other (See Comments)    Messed up lungs  . Indomethacin Other (See Comments)    Renal insufficiency  . Levaquin [Levofloxacin] Other (See Comments)    Messed up lungs  . Lipitor [Atorvastatin] Nausea Only, Palpitations and Other (See Comments)    Irregular heart beat also  . Pravachol [Pravastatin Sodium] Nausea Only, Palpitations and Other (See Comments)  . Aleve [Naproxen Sodium] Nausea And Vomiting and Other (See Comments)    GI Upset  . Norpace [Disopyramide Phosphate] Other (See Comments)    Urinary retention  . Amlodipine Besylate Rash  . Azithromycin Rash  . Bactrim [Sulfamethoxazole-Trimethoprim] Rash  . Clinoril [Sulindac] Rash  . Codeine Other (See Comments)    unknown  . Digoxin Rash  . Lorazepam Other (  See Comments)    unknown  . Morphine And Related Other (See Comments)    unknown  . Other Other (See Comments)    Tripilenomenon? Per paperwork  . Percocet [Oxycodone-Acetaminophen] Rash  . Sulfonamide Derivatives Rash  . Uloric [Febuxostat] Other (See Comments)    unknown    Family History  Problem Relation Age of Onset  . Diabetes type II Sister   . Heart disease Brother   . Diabetes type II Daughter     Prior to Admission medications   Medication Sig Start Date End Date Taking? Authorizing Provider  acetaminophen (TYLENOL) 500 MG tablet Take 500 mg by mouth at bedtime.    Yes Historical Provider, MD  allopurinol (ZYLOPRIM) 300 MG tablet Take 300 mg by mouth daily.    Yes Historical Provider, MD  ALPRAZolam Duanne Moron) 0.5 MG tablet Take  0.5 mg by mouth at bedtime. For sleep   Yes Historical Provider, MD  aspirin 81 MG tablet Take 81 mg by mouth every other day.    Yes Historical Provider, MD  atenolol (TENORMIN) 50 MG tablet Take 50 mg by mouth daily.    Yes Historical Provider, MD  Cholecalciferol 2000 UNITS CAPS Take 4,000 Units by mouth daily.   Yes Historical Provider, MD  colchicine 0.6 MG tablet Take 0.6 mg by mouth daily as needed (gout).    Yes Historical Provider, MD  furosemide (LASIX) 80 MG tablet Take 80 mg by mouth daily.   Yes Historical Provider, MD  gabapentin (NEURONTIN) 100 MG capsule Take 100 mg by mouth 3 (three) times daily. Takes at 0900,1700,1900 daily   Yes Historical Provider, MD  metolazone (ZAROXOLYN) 2.5 MG tablet TAKE 1 TABLET BY MOUTH ONCE WEEKLY 04/22/15  Yes Historical Provider, MD  nitroGLYCERIN (NITROSTAT) 0.4 MG SL tablet Place 0.4 mg under the tongue every 5 (five) minutes as needed. For chest pain   Yes Historical Provider, MD  potassium chloride SA (K-DUR,KLOR-CON) 20 MEQ tablet Take 60 mEq by mouth daily.    Yes Historical Provider, MD  saccharomyces boulardii (FLORASTOR) 250 MG capsule Take 1 capsule (250 mg total) by mouth 2 (two) times daily. 07/09/15  Yes Debbe Odea, MD  warfarin (COUMADIN) 2.5 MG tablet Take 2.5 mg by mouth daily at 6 PM.    Yes Historical Provider, MD  doxycycline (VIBRA-TABS) 100 MG tablet Take 1 tablet (100 mg total) by mouth 2 (two) times daily. Patient not taking: Reported on 09/26/2015 07/09/15   Debbe Odea, MD  terbinafine (LAMISIL) 250 MG tablet Take 1 tablet (250 mg total) by mouth daily. Patient not taking: Reported on 09/26/2015 12/04/13   [name], MD   Physical Exam: Filed Vitals:   09/26/15 1615 09/26/15 1630 09/26/15 1645 09/26/15 1700  BP: 119/64 131/66 128/59 147/72  Pulse: 69 67 69 66  Temp:      TempSrc:      Resp: 21  19 20   SpO2: 96% 97% 96% 98%    Wt Readings from Last 3 Encounters:  09/16/15 88.724 kg (195 lb 9.6 oz)    07/08/15 87 kg (191 lb 12.8 oz)  05/07/15 87.5 kg (192 lb 14.4 oz)    General:  Appears calm and comfortable Eyes: PERRL, normal lids, irises & conjunctiva ENT: grossly normal hearing, lips & tongue Neck: no LAD, masses or thyromegaly Cardiovascular: RRR, no m/r/g.  Telemetry: SR, no arrhythmias  Respiratory: CTA bilaterally, no w/r/r. Normal respiratory effort. Abdomen: soft, ntnd Skin: multiple areas of scabs- various stages of  healing---b/l leg with chronic skin changes-- no open wounds,  +edema-- left toe tender and warmer than right Musculoskeletal: grossly normal tone BUE/BLE Psychiatric: grossly normal mood and affect, speech fluent and appropriate Neurologic: grossly non-focal.          Labs on Admission:  Basic Metabolic Panel:  Recent Labs Lab 09/26/15 0916  NA 136  K 3.8  CL 100*  CO2 28  GLUCOSE 118*  BUN 18  CREATININE 1.13  CALCIUM 9.5   Liver Function Tests:  Recent Labs Lab 09/26/15 0916  AST 27  ALT 15*  ALKPHOS 66  BILITOT 1.3*  PROT 7.5  ALBUMIN 3.3*   No results for input(s): LIPASE, AMYLASE in the last 168 hours. No results for input(s): AMMONIA in the last 168 hours. CBC:  Recent Labs Lab 09/26/15 0916  WBC 15.6*  NEUTROABS 11.9*  HGB 15.0  HCT 45.6  MCV 103.2*  PLT 162   Cardiac Enzymes: No results for input(s): CKTOTAL, CKMB, CKMBINDEX, TROPONINI in the last 168 hours.  BNP (last 3 results) No results for input(s): BNP in the last 8760 hours.  ProBNP (last 3 results) No results for input(s): PROBNP in the last 8760 hours.  CBG: No results for input(s): GLUCAP in the last 168 hours.  Radiological Exams on Admission: Dg Chest 2 View  09/26/2015  CLINICAL DATA:  Fever, chills EXAM: CHEST  2 VIEW COMPARISON:  07/07/2015 FINDINGS: Cardiomediastinal silhouette is stable. Status post median sternotomy. Dual lead cardiac pacemaker is unchanged in position. Mild hyperinflation. No acute infiltrate or pulmonary edema. Stable  bilateral basilar scarring. Stable compression deformity lower thoracic spine. IMPRESSION: No active cardiopulmonary disease. Stable compression deformity lower thoracic spine. Again noted bilateral basilar scarring. Electronically Signed   By: Lahoma Crocker M.D.   On: 09/26/2015 09:58      Assessment/Plan Active Problems:   Atrial fibrillation (HCC)   Fever in adult   Gout   Leukocytosis   Fevers- broad differential -? Gout- does not feel like flares he has had in the past-- check uric acid -?cellulitis-- left leg may be slightly warmer than right-hold on abx for now -blood culture pending U/a and x ray negative -? Viral illness -doubt blood clot as on coumadin and is therapeutic  H/o lymphona -2006- Dr. Elnoria Howard -no lymph nodes  Leukocytosis -trend  H/o a fib -continue home meds Pharmacy to dose coumadin    Code Status: full DVT Prophylaxis: Family Communication: patient/son Disposition Plan: obs  Time spent: 75 min  [facility] Pager [phone]

## 2015-09-27 DIAGNOSIS — R509 Fever, unspecified: Secondary | ICD-10-CM

## 2015-09-27 LAB — BASIC METABOLIC PANEL
Anion gap: 6 (ref 5–15)
BUN: 15 mg/dL (ref 6–20)
CALCIUM: 9.2 mg/dL (ref 8.9–10.3)
CO2: 30 mmol/L (ref 22–32)
CREATININE: 0.93 mg/dL (ref 0.61–1.24)
Chloride: 100 mmol/L — ABNORMAL LOW (ref 101–111)
GFR calc non Af Amer: >60 mL/min (ref >60)
Glucose, Bld: 100 mg/dL — ABNORMAL HIGH (ref 65–99)
Potassium: 3.4 mmol/L — ABNORMAL LOW (ref 3.5–5.1)
SODIUM: 136 mmol/L (ref 135–145)

## 2015-09-27 LAB — CBC
HCT: 43.1 % (ref 39.0–52.0)
Hemoglobin: 14 g/dL (ref 13.0–17.0)
MCH: 33.7 pg (ref 26.0–34.0)
MCHC: 32.5 g/dL (ref 30.0–36.0)
MCV: 103.6 fL — AB (ref 78.0–100.0)
PLATELETS: 161 10*3/uL (ref 150–400)
RBC: 4.16 MIL/uL — AB (ref 4.22–5.81)
RDW: 14.5 % (ref 11.5–15.5)
WBC: 9.7 10*3/uL (ref 4.0–10.5)

## 2015-09-27 LAB — TECHNOLOGIST SMEAR REVIEW

## 2015-09-27 LAB — PROTIME-INR
INR: 2.3 — AB (ref 0.00–1.49)
PROTHROMBIN TIME: 25.1 s — AB (ref 11.6–15.2)

## 2015-09-27 LAB — TROPONIN I
Troponin I: 0.03 ng/mL (ref <0.031)
Troponin I: 0.03 ng/mL (ref <0.031)

## 2015-09-27 MED ORDER — WARFARIN SODIUM 5 MG PO TABS: 2.5000 mg | ORAL_TABLET | Freq: Every day | ORAL | Status: DC

## 2015-09-27 MED ORDER — POTASSIUM CHLORIDE CRYS ER 20 MEQ PO TBCR
40.0000 meq | EXTENDED_RELEASE_TABLET | Freq: Once | ORAL | Status: AC
  Administered 2015-09-27: 40 meq via ORAL
  Filled 2015-09-27: qty 2

## 2015-09-27 NOTE — Progress Notes (Signed)
Patient discharged to home with instructions, son was at bedside.

## 2015-09-27 NOTE — Discharge Summary (Signed)
[facility]  Physician Discharge Summary   Patient ID: [name] MRN: [id number] DOB/AGE: [birth year] 79 y.o.  Admit date: 09/26/2015 Discharge date: 09/27/2015  PCP: Irven Shelling, MD  DISCHARGE DIAGNOSES:  Active Problems:   Atrial fibrillation ([place])   Fever in adult   Gout   Leukocytosis   RECOMMENDATIONS FOR OUTPATIENT FOLLOW UP: 1. Patient instructed to follow-up with his primary care physician early next week. 2. Continue PT/INR checks as before   DISCHARGE CONDITION: fair  Diet recommendation: Heart healthy  INITIAL HISTORY: 81 year old Caucasian male with a past medical history of atrial fibrillation on warfarin, presented with fever.  HOSPITAL COURSE:  Fever No clear source of infection identified on examination or testing. Patient denies any cough. No nausea, vomiting or diarrhea. No abdominal pain. No dysuria. No joint pains. He is noted to have changes of chronic venous stasis. However, no new changes recently. Denies any warmth, redness. Chest x-ray was negative. Fever has subsided. He did have an elevated WBC yesterday. However, it is normal today. Patient was not started on any antibiotics. He feels completely normal. Unclear reason for fever. Blood cultures no growth so far. Patient wishes to go home. I think this is perfectly reasonable. He has been asked to seek attention if he developed fever again and has been asked to follow-up with his primary care physician next week.  All of his other medical issues including atrial fibrillation are stable. INR is therapeutic. He may continue his warfarin and continue outpatient monitoring as before.  Stable for discharge. Potassium will be repleted.   PERTINENT LABS:  The results of significant diagnostics from this hospitalization (including imaging, microbiology, ancillary and laboratory) are listed below for reference.    Microbiology: Recent Results (from the past 240 hour(s))  Culture,  blood (routine x 2)     Status: None (Preliminary result)   Collection Time: 09/26/15  9:16 AM  Result Value Ref Range Status   Specimen Description BLOOD RIGHT ARM  Final   Special Requests BOTTLES DRAWN AEROBIC AND ANAEROBIC 5CC  Final   Culture NO GROWTH 1 DAY  Final   Report Status PENDING  Incomplete  Culture, blood (routine x 2)     Status: None (Preliminary result)   Collection Time: 09/26/15  9:25 AM  Result Value Ref Range Status   Specimen Description BLOOD RIGHT WRIST  Final   Special Requests BOTTLES DRAWN AEROBIC AND ANAEROBIC 5CC  Final   Culture NO GROWTH 1 DAY  Final   Report Status PENDING  Incomplete     Labs: Basic Metabolic Panel:  Recent Labs Lab 09/26/15 0916 09/27/15 0558  NA 136 136  K 3.8 3.4*  CL 100* 100*  CO2 28 30  GLUCOSE 118* 100*  BUN 18 15  CREATININE 1.13 0.93  CALCIUM 9.5 9.2   Liver Function Tests:  Recent Labs Lab 09/26/15 0916  AST 27  ALT 15*  ALKPHOS 66  BILITOT 1.3*  PROT 7.5  ALBUMIN 3.3*   CBC:  Recent Labs Lab 09/26/15 0916 09/27/15 0558  WBC 15.6* 9.7  NEUTROABS 11.9*  --   HGB 15.0 14.0  HCT 45.6 43.1  MCV 103.2* 103.6*  PLT 162 161   Cardiac Enzymes:  Recent Labs Lab 09/26/15 1844 09/26/15 2324 09/27/15 0558  TROPONINI 0.04* 0.03 0.03    IMAGING STUDIES Dg Chest 2 View  09/26/2015  CLINICAL DATA:  Fever, chills EXAM: CHEST  2 VIEW COMPARISON:  07/07/2015 FINDINGS: Cardiomediastinal silhouette is stable. Status post  median sternotomy. Dual lead cardiac pacemaker is unchanged in position. Mild hyperinflation. No acute infiltrate or pulmonary edema. Stable bilateral basilar scarring. Stable compression deformity lower thoracic spine. IMPRESSION: No active cardiopulmonary disease. Stable compression deformity lower thoracic spine. Again noted bilateral basilar scarring. Electronically Signed   By: Lahoma Crocker M.D.   On: 09/26/2015 09:58    DISCHARGE EXAMINATION: Filed Vitals:   09/26/15 1700 09/26/15  1731 09/26/15 2118 09/27/15 0500  BP: 147/72 130/66 125/57 125/64  Pulse: 66 66 67 68  Temp:  98.2 F (36.8 C) 98.3 F (36.8 C) 97.8 F (36.6 C)  TempSrc:  Oral Oral   Resp: 20 20 18 19   SpO2: 98% 98% 97% 98%   General appearance: alert, cooperative, appears stated age and no distress Resp: clear to auscultation bilaterally Cardio: regular rate and rhythm, S1, S2 normal, no murmur, click, rub or gallop GI: soft, non-tender; bowel sounds normal; no masses,  no organomegaly Extremities: Hyper pigmentation noted bilateral lower extremities which is chronic per patient. No erythema. No warmth.  DISPOSITION: Home with son  Discharge Instructions    Call MD for:  difficulty breathing, headache or visual disturbances    Complete by:  As directed      Call MD for:  extreme fatigue    Complete by:  As directed      Call MD for:  hives    Complete by:  As directed      Call MD for:  persistant dizziness or light-headedness    Complete by:  As directed      Call MD for:  persistant nausea and vomiting    Complete by:  As directed      Call MD for:  redness, tenderness, or signs of infection (pain, swelling, redness, odor or green/yellow discharge around incision site)    Complete by:  As directed      Call MD for:  severe uncontrolled pain    Complete by:  As directed      Diet - low sodium heart healthy    Complete by:  As directed      Discharge instructions    Complete by:  As directed   Please follow up with your PCP in 4-5 days. Please check your temperatures daily. Please seek attention if you feel unwell or if your temperature is persistently greater than 100.49F.  You were cared for by a hospitalist during your hospital stay. If you have any questions about your discharge medications or the care you received while you were in the hospital after you are discharged, you can call the unit and asked to speak with the hospitalist on call if the hospitalist that took care of you is not  available. Once you are discharged, your primary care physician will handle any further medical issues. Please note that NO REFILLS for any discharge medications will be authorized once you are discharged, as it is imperative that you return to your primary care physician (or establish a relationship with a primary care physician if you do not have one) for your aftercare needs so that they can reassess your need for medications and monitor your lab values. If you do not have a primary care physician, you can call [phone] for a physician referral.     Increase activity slowly    Complete by:  As directed            ALLERGIES:  Allergies  Allergen Reactions  . Avelox [Moxifloxacin Hcl In Nacl] Other (  See Comments)    Messed up lungs  . Indomethacin Other (See Comments)    Renal insufficiency  . Levaquin [Levofloxacin] Other (See Comments)    Messed up lungs  . Lipitor [Atorvastatin] Nausea Only, Palpitations and Other (See Comments)    Irregular heart beat also  . Pravachol [Pravastatin Sodium] Nausea Only, Palpitations and Other (See Comments)  . Aleve [Naproxen Sodium] Nausea And Vomiting and Other (See Comments)    GI Upset  . Norpace [Disopyramide Phosphate] Other (See Comments)    Urinary retention  . Amlodipine Besylate Rash  . Azithromycin Rash  . Bactrim [Sulfamethoxazole-Trimethoprim] Rash  . Clinoril [Sulindac] Rash  . Codeine Other (See Comments)    unknown  . Digoxin Rash  . Lorazepam Other (See Comments)    unknown  . Morphine And Related Other (See Comments)    unknown  . Other Other (See Comments)    Tripilenomenon? Per paperwork  . Percocet [Oxycodone-Acetaminophen] Rash  . Sulfonamide Derivatives Rash  . Uloric [Febuxostat] Other (See Comments)    unknown     Discharge Medication List as of 09/27/2015 10:08 AM    CONTINUE these medications which have NOT CHANGED   Details  acetaminophen (TYLENOL) 500 MG tablet Take 500 mg by mouth at bedtime. , Until  Discontinued, Historical Med    allopurinol (ZYLOPRIM) 300 MG tablet Take 300 mg by mouth daily. , Until Discontinued, Historical Med    ALPRAZolam (XANAX) 0.5 MG tablet Take 0.5 mg by mouth at bedtime. For sleep, Until Discontinued, Historical Med    aspirin 81 MG tablet Take 81 mg by mouth every other day. , Until Discontinued, Historical Med    atenolol (TENORMIN) 50 MG tablet Take 50 mg by mouth daily. , Until Discontinued, Historical Med    Cholecalciferol 2000 UNITS CAPS Take 4,000 Units by mouth daily., Until Discontinued, Historical Med    colchicine 0.6 MG tablet Take 0.6 mg by mouth daily as needed (gout). , Until Discontinued, Historical Med    furosemide (LASIX) 80 MG tablet Take 80 mg by mouth daily., Until Discontinued, Historical Med    gabapentin (NEURONTIN) 100 MG capsule Take 100 mg by mouth 3 (three) times daily. Takes at 0900,1700,1900 daily, Until Discontinued, Historical Med    metolazone (ZAROXOLYN) 2.5 MG tablet TAKE 1 TABLET BY MOUTH ONCE WEEKLY, Historical Med    nitroGLYCERIN (NITROSTAT) 0.4 MG SL tablet Place 0.4 mg under the tongue every 5 (five) minutes as needed. For chest pain, Until Discontinued, Historical Med    potassium chloride SA (K-DUR,KLOR-CON) 20 MEQ tablet Take 60 mEq by mouth daily. , Until Discontinued, Historical Med    saccharomyces boulardii (FLORASTOR) 250 MG capsule Take 1 capsule (250 mg total) by mouth 2 (two) times daily., Starting 07/09/2015, Until Discontinued, Normal    warfarin (COUMADIN) 2.5 MG tablet Take 2.5 mg by mouth daily at 6 PM. , Until Discontinued, Historical Med       Follow-up Information    Follow up with Irven Shelling, MD. Schedule an appointment as soon as possible for a visit in 4 days.   Specialty:  Internal Medicine   Why:  post hospitalization follow up   Contact information:   [address] [place] [address] [phone]       TOTAL DISCHARGE TIME: 35  minutes  [facility]  [facility] Pager [phone]  09/27/2015, 2:12 PM

## 2015-09-27 NOTE — Progress Notes (Signed)
ANTICOAGULATION CONSULT NOTE - Initial Consult  Pharmacy Consult for Coumadin  Indication: h/o atrial fibrillation  Allergies  Allergen Reactions  . Avelox [Moxifloxacin Hcl In Nacl] Other (See Comments)    Messed up lungs  . Indomethacin Other (See Comments)    Renal insufficiency  . Levaquin [Levofloxacin] Other (See Comments)    Messed up lungs  . Lipitor [Atorvastatin] Nausea Only, Palpitations and Other (See Comments)    Irregular heart beat also  . Pravachol [Pravastatin Sodium] Nausea Only, Palpitations and Other (See Comments)  . Aleve [Naproxen Sodium] Nausea And Vomiting and Other (See Comments)    GI Upset  . Norpace [Disopyramide Phosphate] Other (See Comments)    Urinary retention  . Amlodipine Besylate Rash  . Azithromycin Rash  . Bactrim [Sulfamethoxazole-Trimethoprim] Rash  . Clinoril [Sulindac] Rash  . Codeine Other (See Comments)    unknown  . Digoxin Rash  . Lorazepam Other (See Comments)    unknown  . Morphine And Related Other (See Comments)    unknown  . Other Other (See Comments)    Tripilenomenon? Per paperwork  . Percocet [Oxycodone-Acetaminophen] Rash  . Sulfonamide Derivatives Rash  . Uloric [Febuxostat] Other (See Comments)    unknown    Patient Measurements: Height: 5 ft 2 inches Weight: 88.7 kg  Vital Signs: Temp: 97.8 F (36.6 C) (11/18 0500) BP: 125/64 mmHg (11/18 0500) Pulse Rate: 68 (11/18 0500)  Labs:  Recent Labs  09/26/15 0916 09/26/15 1844 09/26/15 2324 09/27/15 0558  HGB 15.0  --   --  14.0  HCT 45.6  --   --  43.1  PLT 162  --   --  161  LABPROT 26.6*  --   --  25.1*  INR 2.49*  --   --  2.30*  CREATININE 1.13  --   --  0.93  TROPONINI  --  0.04* 0.03 0.03    Estimated Creatinine Clearance: 50.9 mL/min (by C-G formula based on Cr of 0.93).   Assessment: 81 y.o male presents to [facility] on 09/25/15 with fever. He is on chronic coumadin for afib. INR remains therapeutic at 2.3. No bleeding noted. CBC is WNL.   Goal  of Therapy:  INR 2-3 Monitor platelets by anticoagulation protocol: Yes   Plan:  - Resume home regimen of warfarin 2.5mg  PO daily  - Daily INR  Salome Arnt, PharmD, BCPS Pager # [phone] 09/27/2015 10:27 AM

## 2015-09-27 NOTE — Discharge Instructions (Signed)
Blood Culture Test WHY AM I HAVING THIS TEST? A blood culture test is performed to see if you have an infection in your blood (septicemia). Septicemia could be caused by bacteria, fungi, or viruses. Normally, blood is free of bacteria, fungi, and viruses. This test may be ordered if you have symptoms of septicemia. These symptoms may include fever, chills, nausea, and fatigue. WHAT KIND OF SAMPLE IS TAKEN? At least two blood samples from two different veins are required for this test. The blood samples are usually collected by inserting a needle into a vein. This is done because:  There is a better chance of finding the infection with multiple samples.  Sometimes, despite disinfection of the skin where the blood is collected, you can grow a skin contaminant. This will result in a positive blood culture. This is called a false-positive. With multiple samples, there is a better chance of ruling out a false-positive. HOW DO I PREPARE FOR THE TEST? It is preferred to have the blood samples performed before starting antibiotic medicine. Tell your health care provider if you are currently taking an antibiotic. If blood cultures are performed while you are on an antibiotic, the blood samples should be performed shortly before you take a dose of antibiotic. HOW ARE YOUR TEST RESULTS REPORTED? Your test results will be reported as either positive or negative. It is your responsibility to obtain your test results. Ask the lab or department performing the test when and how you will get your results. A false-positive result can occur. A false-positive result is incorrect because it indicates a condition or finding is present when it is not. A false-negative result can occur. A false-negative result is incorrect because it indicates a condition or finding is not present when it is. WHAT DO THE RESULTS MEAN? A positive blood test may mean that you have septicemia. Talk with your health care provider to discuss  your results, treatment options, and if necessary, the need for more tests. Talk with your health care provider if you have any questions about your results.   This information is not intended to replace advice given to you by your health care provider. Make sure you discuss any questions you have with your health care provider.   Document Released: 11/18/2004 Document Revised: 11/16/2014 Document Reviewed: 04/02/2014 Elsevier Interactive Patient Education 2016 [employer].  Fever, Adult A fever is an increase in the body's temperature. It is usually defined as a temperature of 100F (38C) or higher. Brief mild or moderate fevers generally have no long-term effects, and they often do not require treatment. Moderate or high fevers may make you feel uncomfortable and can sometimes be a sign of a serious illness or disease. The sweating that may occur with repeated or prolonged fever may also cause dehydration. Fever is confirmed by taking a temperature with a thermometer. A measured temperature can vary with:  Age.  Time of day.  Location of the thermometer:  Mouth (oral).  Rectum (rectal).  Ear (tympanic).  Underarm (axillary).  Forehead (temporal). HOME CARE INSTRUCTIONS Pay attention to any changes in your symptoms. Take these actions to help with your condition:  Take over-the counter and prescription medicines only as told by your health care provider. Follow the dosing instructions carefully.  If you were prescribed an antibiotic medicine, take it as told by your health care provider. Do not stop taking the antibiotic even if you start to feel better.  Rest as needed.  Drink enough fluid to keep your  urine clear or pale yellow. This helps to prevent dehydration.  Sponge yourself or bathe with room-temperature water to help reduce your body temperature as needed. Do not use ice water.  Do not overbundle yourself in blankets or heavy clothes. SEEK MEDICAL CARE IF:  You  vomit.  You cannot eat or drink without vomiting.  You have diarrhea.  You have pain when you urinate.  Your symptoms do not improve with treatment.  You develop new symptoms.  You develop excessive weakness. SEEK IMMEDIATE MEDICAL CARE IF:  You have shortness of breath or have trouble breathing.  You are dizzy or you faint.  You are disoriented or confused.  You develop signs of dehydration, such as a dry mouth, decreased urination, or paleness.  You develop severe pain in your abdomen.  You have persistent vomiting or diarrhea.  You develop a skin rash.  Your symptoms suddenly get worse.   This information is not intended to replace advice given to you by your health care provider. Make sure you discuss any questions you have with your health care provider.   Document Released: 04/21/2001 Document Revised: 07/17/2015 Document Reviewed: 12/20/2014 Elsevier Interactive Patient Education [employer].

## 2015-10-01 LAB — CULTURE, BLOOD (ROUTINE X 2)
CULTURE: NO GROWTH
Culture: NO GROWTH

## 2015-11-27 ENCOUNTER — Encounter: Payer: Medicare Other | Admitting: Internal Medicine

## 2015-12-16 ENCOUNTER — Ambulatory Visit (INDEPENDENT_AMBULATORY_CARE_PROVIDER_SITE_OTHER): Payer: Medicare Other | Admitting: *Deleted

## 2015-12-16 DIAGNOSIS — I495 Sick sinus syndrome: Secondary | ICD-10-CM

## 2015-12-17 NOTE — Progress Notes (Signed)
Remote pacemaker transmission.   

## 2015-12-18 DIAGNOSIS — L570 Actinic keratosis: Secondary | ICD-10-CM | POA: Diagnosis not present

## 2015-12-18 DIAGNOSIS — L821 Other seborrheic keratosis: Secondary | ICD-10-CM | POA: Diagnosis not present

## 2015-12-18 DIAGNOSIS — Z85828 Personal history of other malignant neoplasm of skin: Secondary | ICD-10-CM | POA: Diagnosis not present

## 2015-12-26 DIAGNOSIS — Z7901 Long term (current) use of anticoagulants: Secondary | ICD-10-CM | POA: Diagnosis not present

## 2016-01-17 LAB — CUP PACEART REMOTE DEVICE CHECK
Battery Remaining Percentage: 57 %
Battery Voltage: 2.89 V
Brady Statistic AS VP Percent: 83 %
Brady Statistic AS VS Percent: 17 %
Brady Statistic RA Percent Paced: 1 %
Implantable Lead Implant Date: 20111025
Implantable Lead Location: 753860
Lead Channel Pacing Threshold Amplitude: 0.75 V
Lead Channel Pacing Threshold Pulse Width: 0.4 ms
Lead Channel Pacing Threshold Pulse Width: 0.4 ms
Lead Channel Sensing Intrinsic Amplitude: 1 mV
Lead Channel Sensing Intrinsic Amplitude: 12 mV
Lead Channel Setting Pacing Amplitude: 2 V
MDC IDC LEAD IMPLANT DT: 20111025
MDC IDC LEAD LOCATION: 753859
MDC IDC MSMT BATTERY REMAINING LONGEVITY: 80 mo
MDC IDC MSMT LEADCHNL RA IMPEDANCE VALUE: 400 Ohm
MDC IDC MSMT LEADCHNL RV IMPEDANCE VALUE: 930 Ohm
MDC IDC MSMT LEADCHNL RV PACING THRESHOLD AMPLITUDE: 0.375 V
MDC IDC PG SERIAL: 7178130
MDC IDC SESS DTM: 20170206072319
MDC IDC SET LEADCHNL RV PACING AMPLITUDE: 0.625
MDC IDC SET LEADCHNL RV PACING PULSEWIDTH: 0.4 ms
MDC IDC SET LEADCHNL RV SENSING SENSITIVITY: 2 mV
MDC IDC STAT BRADY AP VP PERCENT: 1 %
MDC IDC STAT BRADY AP VS PERCENT: 1 %
MDC IDC STAT BRADY RV PERCENT PACED: 83 %

## 2016-01-22 ENCOUNTER — Encounter: Payer: Self-pay | Admitting: Cardiology

## 2016-01-23 DIAGNOSIS — Z7901 Long term (current) use of anticoagulants: Secondary | ICD-10-CM | POA: Diagnosis not present

## 2016-02-05 DIAGNOSIS — H4720 Unspecified optic atrophy: Secondary | ICD-10-CM | POA: Diagnosis not present

## 2016-02-05 DIAGNOSIS — H31012 Macula scars of posterior pole (postinflammatory) (post-traumatic), left eye: Secondary | ICD-10-CM | POA: Diagnosis not present

## 2016-02-20 DIAGNOSIS — Z7901 Long term (current) use of anticoagulants: Secondary | ICD-10-CM | POA: Diagnosis not present

## 2016-03-14 DIAGNOSIS — I10 Essential (primary) hypertension: Secondary | ICD-10-CM | POA: Diagnosis not present

## 2016-03-14 DIAGNOSIS — E785 Hyperlipidemia, unspecified: Secondary | ICD-10-CM | POA: Diagnosis not present

## 2016-03-14 DIAGNOSIS — Z7901 Long term (current) use of anticoagulants: Secondary | ICD-10-CM | POA: Diagnosis not present

## 2016-03-14 DIAGNOSIS — I2510 Atherosclerotic heart disease of native coronary artery without angina pectoris: Secondary | ICD-10-CM | POA: Diagnosis not present

## 2016-03-14 DIAGNOSIS — Z9181 History of falling: Secondary | ICD-10-CM | POA: Diagnosis not present

## 2016-03-14 DIAGNOSIS — K219 Gastro-esophageal reflux disease without esophagitis: Secondary | ICD-10-CM | POA: Diagnosis not present

## 2016-03-14 DIAGNOSIS — R2681 Unsteadiness on feet: Secondary | ICD-10-CM | POA: Diagnosis not present

## 2016-03-14 DIAGNOSIS — Z951 Presence of aortocoronary bypass graft: Secondary | ICD-10-CM | POA: Diagnosis not present

## 2016-03-14 DIAGNOSIS — S81802D Unspecified open wound, left lower leg, subsequent encounter: Secondary | ICD-10-CM | POA: Diagnosis not present

## 2016-03-14 DIAGNOSIS — Z950 Presence of cardiac pacemaker: Secondary | ICD-10-CM | POA: Diagnosis not present

## 2016-03-16 ENCOUNTER — Ambulatory Visit (INDEPENDENT_AMBULATORY_CARE_PROVIDER_SITE_OTHER): Payer: Medicare Other | Admitting: *Deleted

## 2016-03-16 DIAGNOSIS — I495 Sick sinus syndrome: Secondary | ICD-10-CM | POA: Diagnosis not present

## 2016-03-17 NOTE — Progress Notes (Signed)
Remote pacemaker transmission.   

## 2016-03-18 DIAGNOSIS — H61001 Unspecified perichondritis of right external ear: Secondary | ICD-10-CM | POA: Diagnosis not present

## 2016-03-18 DIAGNOSIS — D225 Melanocytic nevi of trunk: Secondary | ICD-10-CM | POA: Diagnosis not present

## 2016-03-18 DIAGNOSIS — L570 Actinic keratosis: Secondary | ICD-10-CM | POA: Diagnosis not present

## 2016-03-18 DIAGNOSIS — H61021 Chronic perichondritis of right external ear: Secondary | ICD-10-CM | POA: Diagnosis not present

## 2016-03-18 DIAGNOSIS — L814 Other melanin hyperpigmentation: Secondary | ICD-10-CM | POA: Diagnosis not present

## 2016-03-18 DIAGNOSIS — L821 Other seborrheic keratosis: Secondary | ICD-10-CM | POA: Diagnosis not present

## 2016-03-18 DIAGNOSIS — D1801 Hemangioma of skin and subcutaneous tissue: Secondary | ICD-10-CM | POA: Diagnosis not present

## 2016-03-18 DIAGNOSIS — Z85828 Personal history of other malignant neoplasm of skin: Secondary | ICD-10-CM | POA: Diagnosis not present

## 2016-03-18 DIAGNOSIS — D692 Other nonthrombocytopenic purpura: Secondary | ICD-10-CM | POA: Diagnosis not present

## 2016-04-22 ENCOUNTER — Encounter: Payer: Self-pay | Admitting: Cardiology

## 2016-04-22 LAB — CUP PACEART REMOTE DEVICE CHECK
Battery Remaining Longevity: 80 mo
Brady Statistic AP VS Percent: 1 %
Brady Statistic AS VP Percent: 87 %
Brady Statistic AS VS Percent: 13 %
Brady Statistic RA Percent Paced: 1 %
Implantable Lead Implant Date: 20111025
Implantable Lead Location: 753859
Implantable Lead Location: 753860
Lead Channel Impedance Value: 390 Ohm
Lead Channel Pacing Threshold Pulse Width: 0.4 ms
Lead Channel Pacing Threshold Pulse Width: 0.4 ms
Lead Channel Sensing Intrinsic Amplitude: 12 mV
Lead Channel Setting Pacing Amplitude: 0.75 V
Lead Channel Setting Pacing Amplitude: 2 V
MDC IDC LEAD IMPLANT DT: 20111025
MDC IDC MSMT BATTERY REMAINING PERCENTAGE: 57 %
MDC IDC MSMT BATTERY VOLTAGE: 2.89 V
MDC IDC MSMT LEADCHNL RA PACING THRESHOLD AMPLITUDE: 0.75 V
MDC IDC MSMT LEADCHNL RA SENSING INTR AMPL: 1 mV
MDC IDC MSMT LEADCHNL RV IMPEDANCE VALUE: 860 Ohm
MDC IDC MSMT LEADCHNL RV PACING THRESHOLD AMPLITUDE: 0.5 V
MDC IDC SESS DTM: 20170508075305
MDC IDC SET LEADCHNL RV PACING PULSEWIDTH: 0.4 ms
MDC IDC SET LEADCHNL RV SENSING SENSITIVITY: 2 mV
MDC IDC STAT BRADY AP VP PERCENT: 1 %
MDC IDC STAT BRADY RV PERCENT PACED: 87 %
Pulse Gen Model: 2210
Pulse Gen Serial Number: 7178130

## 2016-05-07 DIAGNOSIS — I5030 Unspecified diastolic (congestive) heart failure: Secondary | ICD-10-CM | POA: Diagnosis not present

## 2016-05-07 DIAGNOSIS — D649 Anemia, unspecified: Secondary | ICD-10-CM | POA: Diagnosis not present

## 2016-05-07 DIAGNOSIS — E876 Hypokalemia: Secondary | ICD-10-CM | POA: Diagnosis not present

## 2016-05-22 DIAGNOSIS — Z7901 Long term (current) use of anticoagulants: Secondary | ICD-10-CM | POA: Diagnosis not present

## 2016-06-09 DIAGNOSIS — I10 Essential (primary) hypertension: Secondary | ICD-10-CM | POA: Diagnosis not present

## 2016-06-09 DIAGNOSIS — Z5181 Encounter for therapeutic drug level monitoring: Secondary | ICD-10-CM | POA: Diagnosis not present

## 2016-06-09 DIAGNOSIS — I872 Venous insufficiency (chronic) (peripheral): Secondary | ICD-10-CM | POA: Diagnosis not present

## 2016-06-09 DIAGNOSIS — I5042 Chronic combined systolic (congestive) and diastolic (congestive) heart failure: Secondary | ICD-10-CM | POA: Diagnosis not present

## 2016-06-09 DIAGNOSIS — C8590 Non-Hodgkin lymphoma, unspecified, unspecified site: Secondary | ICD-10-CM | POA: Diagnosis not present

## 2016-06-09 DIAGNOSIS — R7301 Impaired fasting glucose: Secondary | ICD-10-CM | POA: Diagnosis not present

## 2016-06-09 DIAGNOSIS — I482 Chronic atrial fibrillation: Secondary | ICD-10-CM | POA: Diagnosis not present

## 2016-06-09 DIAGNOSIS — M109 Gout, unspecified: Secondary | ICD-10-CM | POA: Diagnosis not present

## 2016-06-15 ENCOUNTER — Ambulatory Visit (INDEPENDENT_AMBULATORY_CARE_PROVIDER_SITE_OTHER): Payer: Medicare Other | Admitting: *Deleted

## 2016-06-15 DIAGNOSIS — I495 Sick sinus syndrome: Secondary | ICD-10-CM | POA: Diagnosis not present

## 2016-06-15 NOTE — Progress Notes (Signed)
Remote pacemaker transmission.   

## 2016-06-17 ENCOUNTER — Encounter: Payer: Self-pay | Admitting: Cardiology

## 2016-06-18 DIAGNOSIS — Z85828 Personal history of other malignant neoplasm of skin: Secondary | ICD-10-CM | POA: Diagnosis not present

## 2016-06-18 DIAGNOSIS — L570 Actinic keratosis: Secondary | ICD-10-CM | POA: Diagnosis not present

## 2016-06-18 DIAGNOSIS — L821 Other seborrheic keratosis: Secondary | ICD-10-CM | POA: Diagnosis not present

## 2016-06-18 DIAGNOSIS — D692 Other nonthrombocytopenic purpura: Secondary | ICD-10-CM | POA: Diagnosis not present

## 2016-06-19 LAB — CUP PACEART REMOTE DEVICE CHECK
Battery Remaining Longevity: 80 mo
Battery Remaining Percentage: 57 %
Battery Voltage: 2.89 V
Brady Statistic AP VP Percent: 1 %
Brady Statistic AP VS Percent: 1 %
Brady Statistic AS VP Percent: 89 %
Brady Statistic AS VS Percent: 11 %
Brady Statistic RA Percent Paced: 1 %
Brady Statistic RV Percent Paced: 89 %
Date Time Interrogation Session: 20170807062456
Implantable Lead Implant Date: 20111025
Implantable Lead Implant Date: 20111025
Implantable Lead Location: 753859
Implantable Lead Location: 753860
Lead Channel Impedance Value: 390 Ohm
Lead Channel Impedance Value: 890 Ohm
Lead Channel Pacing Threshold Amplitude: 0.375 V
Lead Channel Pacing Threshold Amplitude: 0.75 V
Lead Channel Pacing Threshold Pulse Width: 0.4 ms
Lead Channel Pacing Threshold Pulse Width: 0.4 ms
Lead Channel Sensing Intrinsic Amplitude: 1 mV
Lead Channel Sensing Intrinsic Amplitude: 12 mV
Lead Channel Setting Pacing Amplitude: 0.625
Lead Channel Setting Pacing Amplitude: 2 V
Lead Channel Setting Pacing Pulse Width: 0.4 ms
Lead Channel Setting Sensing Sensitivity: 2 mV
Pulse Gen Model: 2210
Pulse Gen Serial Number: 7178130

## 2016-07-02 DIAGNOSIS — Z7901 Long term (current) use of anticoagulants: Secondary | ICD-10-CM | POA: Diagnosis not present

## 2016-07-30 DIAGNOSIS — Z7901 Long term (current) use of anticoagulants: Secondary | ICD-10-CM | POA: Diagnosis not present

## 2016-07-30 DIAGNOSIS — Z23 Encounter for immunization: Secondary | ICD-10-CM | POA: Diagnosis not present

## 2016-08-11 DIAGNOSIS — H01001 Unspecified blepharitis right upper eyelid: Secondary | ICD-10-CM | POA: Diagnosis not present

## 2016-08-11 DIAGNOSIS — H3530 Unspecified macular degeneration: Secondary | ICD-10-CM | POA: Diagnosis not present

## 2016-08-11 DIAGNOSIS — H4720 Unspecified optic atrophy: Secondary | ICD-10-CM | POA: Diagnosis not present

## 2016-08-11 DIAGNOSIS — H26493 Other secondary cataract, bilateral: Secondary | ICD-10-CM | POA: Diagnosis not present

## 2016-08-26 DIAGNOSIS — Z7901 Long term (current) use of anticoagulants: Secondary | ICD-10-CM | POA: Diagnosis not present

## 2016-09-23 ENCOUNTER — Ambulatory Visit (INDEPENDENT_AMBULATORY_CARE_PROVIDER_SITE_OTHER): Payer: Medicare Other | Admitting: Internal Medicine

## 2016-09-23 ENCOUNTER — Encounter: Payer: Self-pay | Admitting: Internal Medicine

## 2016-09-23 VITALS — BP 138/76 | HR 67 | Ht 62.0 in | Wt 196.0 lb

## 2016-09-23 DIAGNOSIS — Z950 Presence of cardiac pacemaker: Secondary | ICD-10-CM | POA: Diagnosis not present

## 2016-09-23 DIAGNOSIS — I482 Chronic atrial fibrillation, unspecified: Secondary | ICD-10-CM

## 2016-09-23 DIAGNOSIS — Z7901 Long term (current) use of anticoagulants: Secondary | ICD-10-CM | POA: Diagnosis not present

## 2016-09-23 NOTE — Patient Instructions (Addendum)
Medication Instructions:  Your physician recommends that you continue on your current medications as directed. Please refer to the Current Medication list given to you today.   Labwork: None Ordered   Testing/Procedures: None Ordered   Follow-Up: Your physician wants you to follow-up in: 1 year with Dr. Lovena Le.  You will receive a reminder letter in the mail two months in advance. If you don't receive a letter, please call our office to schedule the follow-up appointment.  Remote monitoring is used to monitor your Pacemaker from home. This monitoring reduces the number of office visits required to check your device to one time per year. It allows [place] to keep an eye on the functioning of your device to ensure it is working properly. You are scheduled for a device check from home on 12/23/16. You may send your transmission at any time that day. If you have a wireless device, the transmission will be sent automatically. After your physician reviews your transmission, you will receive a postcard with your next transmission date.    Any Other Special Instructions Will Be Listed Below (If Applicable).     If you need a refill on your cardiac medications before your next appointment, please call your pharmacy.

## 2016-09-23 NOTE — Progress Notes (Signed)
HPI [name] returns today for followup. He is a very pleasant 82 yo man with a history of symptomatic tachybradycardia syndrome , status post permanent pacemaker insertion. He now has chronic atrial fibrillation. Despite this, he is stable. He denies palpitations, chest pain, or syncope. His heart failure symptoms are class II. His main problem is chronic venous insufficiency and the propensity for cellulitis. He denies fever or chills. No syncope. He has started wearing support stockings and is improved. Allergies  Allergen Reactions  . Avelox [Moxifloxacin Hcl In Nacl] Other (See Comments)    Messed up lungs  . Indomethacin Other (See Comments)    Renal insufficiency  . Levaquin [Levofloxacin] Other (See Comments)    Messed up lungs  . Lipitor [Atorvastatin] Nausea Only, Palpitations and Other (See Comments)    Irregular heart beat also  . Moxifloxacin Other (See Comments)    Respiratory distress  . Pravachol [Pravastatin Sodium] Nausea Only, Palpitations and Other (See Comments)  . Aleve [Naproxen Sodium] Nausea And Vomiting and Other (See Comments)    GI Upset  . Norpace [Disopyramide Phosphate] Other (See Comments)    Urinary retention  . Disopyramide Other (See Comments)    Urinary retention  . Morphine Nausea Only    Irregular hear beat  . Pravastatin Nausea Only    Irregular heart beat  . Amlodipine Besylate Rash  . Azithromycin Rash  . Bactrim [Sulfamethoxazole-Trimethoprim] Rash  . Clinoril [Sulindac] Rash  . Codeine Other (See Comments)    unknown  . Digoxin Rash  . Lorazepam Other (See Comments)    unknown  . Morphine And Related Other (See Comments)    unknown  . Other Other (See Comments)    Tripilenomenon? Per paperwork  . Oxycodone-Acetaminophen Rash  . Percocet [Oxycodone-Acetaminophen] Rash  . Sulfamethoxazole-Trimethoprim Rash  . Sulfonamide Derivatives Rash  . Uloric [Febuxostat] Other (See Comments)    unknown     Current Outpatient  Prescriptions  Medication Sig Dispense Refill  . acetaminophen (TYLENOL) 500 MG tablet Take 500 mg by mouth at bedtime.     Marland Kitchen allopurinol (ZYLOPRIM) 300 MG tablet Take 300 mg by mouth daily.     Marland Kitchen ALPRAZolam (XANAX) 0.5 MG tablet Take 0.5 mg by mouth at bedtime. For sleep    . aspirin 81 MG tablet Take 81 mg by mouth every other day.     Marland Kitchen atenolol (TENORMIN) 50 MG tablet Take 50 mg by mouth daily.     . Cholecalciferol 2000 UNITS CAPS Take 4,000 Units by mouth daily.    . colchicine 0.6 MG tablet Take 0.6 mg by mouth daily as needed (gout).     . furosemide (LASIX) 80 MG tablet Take 80 mg by mouth daily.    Marland Kitchen gabapentin (NEURONTIN) 100 MG capsule Take 100 mg by mouth 3 (three) times daily. Takes at 0900,1700,1900 daily    . metolazone (ZAROXOLYN) 2.5 MG tablet TAKE 1 TABLET BY MOUTH AS NEEDED FOR SWELLING (TAKE AS DIRECTED)  1  . nitroGLYCERIN (NITROSTAT) 0.4 MG SL tablet Place 0.4 mg under the tongue every 5 (five) minutes as needed. For chest pain    . potassium chloride SA (K-DUR,KLOR-CON) 20 MEQ tablet Take 80 mEq by mouth daily.     Marland Kitchen warfarin (COUMADIN) 2.5 MG tablet Take 2.5 mg by mouth daily at 6 PM.      No current facility-administered medications for this visit.      Past Medical History:  Diagnosis Date  . Arrhythmia  afib  . Arthritis    "back and hands" (09/26/2015)  . Basal cell carcinoma   . CHF (congestive heart failure) ([place])   . Coronary artery disease   . CVI (common variable immunodeficiency) ([place])   . DJD (degenerative joint disease)   . ED (erectile dysfunction)   . GERD (gastroesophageal reflux disease)   . Gout   . Heart attack 1986  . Hyperlipidemia   . Hypertension   . IFG (impaired fasting glucose)   . Neuropathy ([place])   . Non Hodgkin's lymphoma ([place]) dx'd 2006  . PAC (premature atrial contraction)   . PE (pulmonary embolism) 2007   "when I was taking chemo"  . Peptic ulcer disease   . Pneumonia ~ 2007 X 2  . Squamous carcinoma   . Venous  stasis ulcers (H[facility])     ROS:   All systems reviewed and negative except as noted in the HPI.   Past Surgical History:  Procedure Laterality Date  . BACK SURGERY    . CARDIAC CATHETERIZATION  1990  . CATARACT EXTRACTION W/ INTRAOCULAR LENS  IMPLANT, BILATERAL Bilateral 10/2006 ~ 11/2006  . CORONARY ANGIOPLASTY    . CORONARY ARTERY BYPASS GRAFT  1990   CABG X3  . ELBOW SURGERY Left    "related to gout"  . INSERT / REPLACE / REMOVE PACEMAKER  08/2010  . KNEE ARTHROSCOPY Left   . LUMBAR DISC SURGERY     "had pinched nerve; had to make room for it"  . NASAL SINUS SURGERY  1980   "related to bacteria infection in sinus"  . SQUAMOUS CELL CARCINOMA EXCISION Right 09/12/2015   cheek  . TONSILLECTOMY  1972     Family History  Problem Relation Age of Onset  . Diabetes type II Sister   . Heart disease Brother   . Diabetes type II Daughter      Social History   Social History  . Marital status: Widowed    Spouse name: N/A  . Number of children: N/A  . Years of education: N/A   Occupational History  . Not on file.   Social History Main Topics  . Smoking status: Former Smoker    Packs/day: 1.00    Years: 1.00    Types: Cigarettes  . Smokeless tobacco: Never Used  . Alcohol use No  . Drug use: No  . Sexual activity: No   Other Topics Concern  . Not on file   Social History Narrative  . No narrative on file     BP 138/76   Pulse 67   Ht 5\' 2"  (1.575 m)   Wt 196 lb (88.9 kg)   BMI 35.85 kg/m   Physical Exam:  Well appearing elderly man, NAD HEENT: Unremarkable Neck:  6 cm JVD, no thyromegally Lungs:  Clear with no wheezes, rales, or rhonchi. HEART:  Regular rate rhythm, no murmurs, no rubs, no clicks Abd:  soft, positive bowel sounds, no organomegally, no rebound, no guarding Ext:  2 plus pulses, 2+ edema, legs are wrapped, no cyanosis, no clubbing Skin:  No rashes no nodules Neuro:  CN II through XII intact, motor grossly intact   DEVICE  Normal  device function.  See PaceArt for details.   Assess/Plan:  1. Atrial fib - his ventricular rate is well controlled and he will continue his current meds.  2. Chronic diastolic heart failure - his symptoms are class 2. I have encouraged him to maintain a low sodium diet. 3. PPM -  his [employer]. Jude device is working normally. We will reprogram him to VVIR mode.  Mikle Bosworth.D.

## 2016-09-28 DIAGNOSIS — Z85828 Personal history of other malignant neoplasm of skin: Secondary | ICD-10-CM | POA: Diagnosis not present

## 2016-09-28 DIAGNOSIS — L570 Actinic keratosis: Secondary | ICD-10-CM | POA: Diagnosis not present

## 2016-09-28 DIAGNOSIS — C44329 Squamous cell carcinoma of skin of other parts of face: Secondary | ICD-10-CM | POA: Diagnosis not present

## 2016-09-28 DIAGNOSIS — L821 Other seborrheic keratosis: Secondary | ICD-10-CM | POA: Diagnosis not present

## 2016-10-28 DIAGNOSIS — Z7901 Long term (current) use of anticoagulants: Secondary | ICD-10-CM | POA: Diagnosis not present

## 2016-12-01 DIAGNOSIS — I872 Venous insufficiency (chronic) (peripheral): Secondary | ICD-10-CM | POA: Diagnosis not present

## 2016-12-01 DIAGNOSIS — I2510 Atherosclerotic heart disease of native coronary artery without angina pectoris: Secondary | ICD-10-CM | POA: Diagnosis not present

## 2016-12-01 DIAGNOSIS — M109 Gout, unspecified: Secondary | ICD-10-CM | POA: Diagnosis not present

## 2016-12-01 DIAGNOSIS — L97821 Non-pressure chronic ulcer of other part of left lower leg limited to breakdown of skin: Secondary | ICD-10-CM | POA: Diagnosis not present

## 2016-12-01 DIAGNOSIS — L97321 Non-pressure chronic ulcer of left ankle limited to breakdown of skin: Secondary | ICD-10-CM | POA: Diagnosis not present

## 2016-12-01 DIAGNOSIS — I482 Chronic atrial fibrillation: Secondary | ICD-10-CM | POA: Diagnosis not present

## 2016-12-01 DIAGNOSIS — L97221 Non-pressure chronic ulcer of left calf limited to breakdown of skin: Secondary | ICD-10-CM | POA: Diagnosis not present

## 2016-12-01 DIAGNOSIS — I110 Hypertensive heart disease with heart failure: Secondary | ICD-10-CM | POA: Diagnosis not present

## 2016-12-01 DIAGNOSIS — L97811 Non-pressure chronic ulcer of other part of right lower leg limited to breakdown of skin: Secondary | ICD-10-CM | POA: Diagnosis not present

## 2016-12-01 DIAGNOSIS — I5042 Chronic combined systolic (congestive) and diastolic (congestive) heart failure: Secondary | ICD-10-CM | POA: Diagnosis not present

## 2016-12-03 DIAGNOSIS — I10 Essential (primary) hypertension: Secondary | ICD-10-CM | POA: Diagnosis not present

## 2016-12-03 DIAGNOSIS — I482 Chronic atrial fibrillation: Secondary | ICD-10-CM | POA: Diagnosis not present

## 2016-12-03 DIAGNOSIS — I5042 Chronic combined systolic (congestive) and diastolic (congestive) heart failure: Secondary | ICD-10-CM | POA: Diagnosis not present

## 2016-12-03 DIAGNOSIS — M109 Gout, unspecified: Secondary | ICD-10-CM | POA: Diagnosis not present

## 2016-12-03 DIAGNOSIS — Z1389 Encounter for screening for other disorder: Secondary | ICD-10-CM | POA: Diagnosis not present

## 2016-12-03 DIAGNOSIS — Z7901 Long term (current) use of anticoagulants: Secondary | ICD-10-CM | POA: Diagnosis not present

## 2016-12-03 DIAGNOSIS — I872 Venous insufficiency (chronic) (peripheral): Secondary | ICD-10-CM | POA: Diagnosis not present

## 2016-12-03 DIAGNOSIS — Z0000 Encounter for general adult medical examination without abnormal findings: Secondary | ICD-10-CM | POA: Diagnosis not present

## 2016-12-03 DIAGNOSIS — R7301 Impaired fasting glucose: Secondary | ICD-10-CM | POA: Diagnosis not present

## 2016-12-03 DIAGNOSIS — I2510 Atherosclerotic heart disease of native coronary artery without angina pectoris: Secondary | ICD-10-CM | POA: Diagnosis not present

## 2016-12-23 ENCOUNTER — Ambulatory Visit (INDEPENDENT_AMBULATORY_CARE_PROVIDER_SITE_OTHER): Payer: Medicare Other | Admitting: *Deleted

## 2016-12-23 DIAGNOSIS — I495 Sick sinus syndrome: Secondary | ICD-10-CM

## 2016-12-23 NOTE — Progress Notes (Signed)
Remote pacemaker transmission.   

## 2016-12-24 ENCOUNTER — Encounter: Payer: Self-pay | Admitting: Cardiology

## 2016-12-30 DIAGNOSIS — M545 Low back pain: Secondary | ICD-10-CM | POA: Diagnosis not present

## 2017-01-01 LAB — CUP PACEART REMOTE DEVICE CHECK
Battery Remaining Longevity: 88 mo
Battery Remaining Percentage: 57 %
Brady Statistic RV Percent Paced: 85 %
Implantable Lead Implant Date: 20111025
Implantable Lead Location: 753859
Implantable Pulse Generator Implant Date: 20111025
Lead Channel Pacing Threshold Pulse Width: 0.4 ms
Lead Channel Setting Pacing Amplitude: 0.75 V
Lead Channel Setting Pacing Pulse Width: 0.4 ms
Lead Channel Setting Sensing Sensitivity: 2 mV
MDC IDC LEAD IMPLANT DT: 20111025
MDC IDC LEAD LOCATION: 753860
MDC IDC MSMT BATTERY VOLTAGE: 2.89 V
MDC IDC MSMT LEADCHNL RV IMPEDANCE VALUE: 780 Ohm
MDC IDC MSMT LEADCHNL RV PACING THRESHOLD AMPLITUDE: 0.5 V
MDC IDC MSMT LEADCHNL RV SENSING INTR AMPL: 12 mV
MDC IDC PG SERIAL: 7178130
MDC IDC SESS DTM: 20180214073043

## 2017-01-13 DIAGNOSIS — L570 Actinic keratosis: Secondary | ICD-10-CM | POA: Diagnosis not present

## 2017-01-13 DIAGNOSIS — L910 Hypertrophic scar: Secondary | ICD-10-CM | POA: Diagnosis not present

## 2017-01-13 DIAGNOSIS — Z85828 Personal history of other malignant neoplasm of skin: Secondary | ICD-10-CM | POA: Diagnosis not present

## 2017-01-13 DIAGNOSIS — L821 Other seborrheic keratosis: Secondary | ICD-10-CM | POA: Diagnosis not present

## 2017-01-13 DIAGNOSIS — C44329 Squamous cell carcinoma of skin of other parts of face: Secondary | ICD-10-CM | POA: Diagnosis not present

## 2017-01-21 DIAGNOSIS — I739 Peripheral vascular disease, unspecified: Secondary | ICD-10-CM | POA: Diagnosis not present

## 2017-01-21 DIAGNOSIS — L97921 Non-pressure chronic ulcer of unspecified part of left lower leg limited to breakdown of skin: Secondary | ICD-10-CM | POA: Diagnosis not present

## 2017-01-21 DIAGNOSIS — L97311 Non-pressure chronic ulcer of right ankle limited to breakdown of skin: Secondary | ICD-10-CM | POA: Diagnosis not present

## 2017-01-21 DIAGNOSIS — I87303 Chronic venous hypertension (idiopathic) without complications of bilateral lower extremity: Secondary | ICD-10-CM | POA: Diagnosis not present

## 2017-01-21 DIAGNOSIS — L97929 Non-pressure chronic ulcer of unspecified part of left lower leg with unspecified severity: Secondary | ICD-10-CM

## 2017-01-21 DIAGNOSIS — I83029 Varicose veins of left lower extremity with ulcer of unspecified site: Secondary | ICD-10-CM | POA: Insufficient documentation

## 2017-01-25 DIAGNOSIS — L97329 Non-pressure chronic ulcer of left ankle with unspecified severity: Secondary | ICD-10-CM | POA: Diagnosis not present

## 2017-01-25 DIAGNOSIS — H00014 Hordeolum externum left upper eyelid: Secondary | ICD-10-CM | POA: Diagnosis not present

## 2017-01-28 DIAGNOSIS — I83029 Varicose veins of left lower extremity with ulcer of unspecified site: Secondary | ICD-10-CM | POA: Diagnosis not present

## 2017-01-28 DIAGNOSIS — I87303 Chronic venous hypertension (idiopathic) without complications of bilateral lower extremity: Secondary | ICD-10-CM | POA: Diagnosis not present

## 2017-01-28 DIAGNOSIS — I83229 Varicose veins of left lower extremity with both ulcer of unspecified site and inflammation: Secondary | ICD-10-CM | POA: Diagnosis not present

## 2017-02-11 DIAGNOSIS — L97821 Non-pressure chronic ulcer of other part of left lower leg limited to breakdown of skin: Secondary | ICD-10-CM | POA: Diagnosis not present

## 2017-02-11 DIAGNOSIS — I87303 Chronic venous hypertension (idiopathic) without complications of bilateral lower extremity: Secondary | ICD-10-CM | POA: Diagnosis not present

## 2017-02-25 DIAGNOSIS — I87313 Chronic venous hypertension (idiopathic) with ulcer of bilateral lower extremity: Secondary | ICD-10-CM | POA: Diagnosis not present

## 2017-02-25 DIAGNOSIS — I872 Venous insufficiency (chronic) (peripheral): Secondary | ICD-10-CM | POA: Diagnosis not present

## 2017-02-25 DIAGNOSIS — I739 Peripheral vascular disease, unspecified: Secondary | ICD-10-CM | POA: Diagnosis not present

## 2017-02-25 DIAGNOSIS — L97329 Non-pressure chronic ulcer of left ankle with unspecified severity: Secondary | ICD-10-CM | POA: Diagnosis not present

## 2017-02-25 DIAGNOSIS — L97319 Non-pressure chronic ulcer of right ankle with unspecified severity: Secondary | ICD-10-CM | POA: Diagnosis not present

## 2017-02-25 DIAGNOSIS — I83223 Varicose veins of left lower extremity with both ulcer of ankle and inflammation: Secondary | ICD-10-CM | POA: Diagnosis not present

## 2017-02-25 DIAGNOSIS — L97321 Non-pressure chronic ulcer of left ankle limited to breakdown of skin: Secondary | ICD-10-CM | POA: Diagnosis not present

## 2017-02-25 DIAGNOSIS — L97311 Non-pressure chronic ulcer of right ankle limited to breakdown of skin: Secondary | ICD-10-CM | POA: Diagnosis not present

## 2017-03-11 DIAGNOSIS — I83028 Varicose veins of left lower extremity with ulcer other part of lower leg: Secondary | ICD-10-CM | POA: Diagnosis not present

## 2017-03-11 DIAGNOSIS — L97928 Non-pressure chronic ulcer of unspecified part of left lower leg with other specified severity: Secondary | ICD-10-CM | POA: Diagnosis not present

## 2017-03-11 DIAGNOSIS — I83228 Varicose veins of left lower extremity with both ulcer of other part of lower extremity and inflammation: Secondary | ICD-10-CM | POA: Diagnosis not present

## 2017-03-11 DIAGNOSIS — L97929 Non-pressure chronic ulcer of unspecified part of left lower leg with unspecified severity: Secondary | ICD-10-CM | POA: Diagnosis not present

## 2017-03-11 DIAGNOSIS — I872 Venous insufficiency (chronic) (peripheral): Secondary | ICD-10-CM | POA: Diagnosis not present

## 2017-03-11 DIAGNOSIS — I83029 Varicose veins of left lower extremity with ulcer of unspecified site: Secondary | ICD-10-CM | POA: Diagnosis not present

## 2017-03-11 DIAGNOSIS — I87303 Chronic venous hypertension (idiopathic) without complications of bilateral lower extremity: Secondary | ICD-10-CM | POA: Diagnosis not present

## 2017-03-11 DIAGNOSIS — I83229 Varicose veins of left lower extremity with both ulcer of unspecified site and inflammation: Secondary | ICD-10-CM | POA: Diagnosis not present

## 2017-03-11 DIAGNOSIS — I10 Essential (primary) hypertension: Secondary | ICD-10-CM | POA: Diagnosis not present

## 2017-03-19 DIAGNOSIS — R5383 Other fatigue: Secondary | ICD-10-CM | POA: Diagnosis not present

## 2017-03-21 ENCOUNTER — Emergency Department (HOSPITAL_COMMUNITY): Payer: Medicare Other

## 2017-03-21 ENCOUNTER — Encounter (HOSPITAL_COMMUNITY): Payer: Self-pay

## 2017-03-21 ENCOUNTER — Observation Stay (HOSPITAL_COMMUNITY)
Admission: EM | Admit: 2017-03-21 | Discharge: 2017-03-23 | Disposition: A | Discharge status: Skilled Nursing Facility | Payer: Medicare Other | Attending: Internal Medicine | Admitting: Internal Medicine

## 2017-03-21 DIAGNOSIS — L89899 Pressure ulcer of other site, unspecified stage: Secondary | ICD-10-CM | POA: Diagnosis not present

## 2017-03-21 DIAGNOSIS — R269 Unspecified abnormalities of gait and mobility: Secondary | ICD-10-CM | POA: Diagnosis not present

## 2017-03-21 DIAGNOSIS — L8990 Pressure ulcer of unspecified site, unspecified stage: Secondary | ICD-10-CM | POA: Insufficient documentation

## 2017-03-21 DIAGNOSIS — W19XXXA Unspecified fall, initial encounter: Secondary | ICD-10-CM | POA: Diagnosis not present

## 2017-03-21 DIAGNOSIS — I110 Hypertensive heart disease with heart failure: Secondary | ICD-10-CM | POA: Diagnosis not present

## 2017-03-21 DIAGNOSIS — I951 Orthostatic hypotension: Secondary | ICD-10-CM | POA: Diagnosis present

## 2017-03-21 DIAGNOSIS — Z8572 Personal history of non-Hodgkin lymphomas: Secondary | ICD-10-CM | POA: Diagnosis not present

## 2017-03-21 DIAGNOSIS — Z87891 Personal history of nicotine dependence: Secondary | ICD-10-CM | POA: Insufficient documentation

## 2017-03-21 DIAGNOSIS — R2681 Unsteadiness on feet: Secondary | ICD-10-CM | POA: Diagnosis not present

## 2017-03-21 DIAGNOSIS — Z9841 Cataract extraction status, right eye: Secondary | ICD-10-CM | POA: Insufficient documentation

## 2017-03-21 DIAGNOSIS — Z882 Allergy status to sulfonamides status: Secondary | ICD-10-CM | POA: Insufficient documentation

## 2017-03-21 DIAGNOSIS — E860 Dehydration: Secondary | ICD-10-CM | POA: Diagnosis not present

## 2017-03-21 DIAGNOSIS — I5042 Chronic combined systolic (congestive) and diastolic (congestive) heart failure: Secondary | ICD-10-CM | POA: Diagnosis not present

## 2017-03-21 DIAGNOSIS — Z888 Allergy status to other drugs, medicaments and biological substances status: Secondary | ICD-10-CM | POA: Insufficient documentation

## 2017-03-21 DIAGNOSIS — I482 Chronic atrial fibrillation, unspecified: Secondary | ICD-10-CM | POA: Diagnosis present

## 2017-03-21 DIAGNOSIS — L03116 Cellulitis of left lower limb: Secondary | ICD-10-CM | POA: Insufficient documentation

## 2017-03-21 DIAGNOSIS — I4891 Unspecified atrial fibrillation: Secondary | ICD-10-CM | POA: Diagnosis present

## 2017-03-21 DIAGNOSIS — Z951 Presence of aortocoronary bypass graft: Secondary | ICD-10-CM | POA: Insufficient documentation

## 2017-03-21 DIAGNOSIS — I872 Venous insufficiency (chronic) (peripheral): Secondary | ICD-10-CM | POA: Diagnosis not present

## 2017-03-21 DIAGNOSIS — I2510 Atherosclerotic heart disease of native coronary artery without angina pectoris: Secondary | ICD-10-CM | POA: Diagnosis not present

## 2017-03-21 DIAGNOSIS — S0003XA Contusion of scalp, initial encounter: Secondary | ICD-10-CM | POA: Diagnosis not present

## 2017-03-21 DIAGNOSIS — M479 Spondylosis, unspecified: Secondary | ICD-10-CM | POA: Diagnosis not present

## 2017-03-21 DIAGNOSIS — M19041 Primary osteoarthritis, right hand: Secondary | ICD-10-CM | POA: Insufficient documentation

## 2017-03-21 DIAGNOSIS — L03115 Cellulitis of right lower limb: Secondary | ICD-10-CM | POA: Insufficient documentation

## 2017-03-21 DIAGNOSIS — I255 Ischemic cardiomyopathy: Secondary | ICD-10-CM | POA: Insufficient documentation

## 2017-03-21 DIAGNOSIS — M109 Gout, unspecified: Secondary | ICD-10-CM | POA: Insufficient documentation

## 2017-03-21 DIAGNOSIS — G919 Hydrocephalus, unspecified: Secondary | ICD-10-CM | POA: Insufficient documentation

## 2017-03-21 DIAGNOSIS — M19042 Primary osteoarthritis, left hand: Secondary | ICD-10-CM | POA: Insufficient documentation

## 2017-03-21 DIAGNOSIS — Z881 Allergy status to other antibiotic agents status: Secondary | ICD-10-CM | POA: Insufficient documentation

## 2017-03-21 DIAGNOSIS — R42 Dizziness and giddiness: Secondary | ICD-10-CM | POA: Diagnosis not present

## 2017-03-21 DIAGNOSIS — Z961 Presence of intraocular lens: Secondary | ICD-10-CM | POA: Insufficient documentation

## 2017-03-21 DIAGNOSIS — Z9889 Other specified postprocedural states: Secondary | ICD-10-CM | POA: Insufficient documentation

## 2017-03-21 DIAGNOSIS — E785 Hyperlipidemia, unspecified: Secondary | ICD-10-CM | POA: Diagnosis not present

## 2017-03-21 DIAGNOSIS — Z7982 Long term (current) use of aspirin: Secondary | ICD-10-CM | POA: Insufficient documentation

## 2017-03-21 DIAGNOSIS — G629 Polyneuropathy, unspecified: Secondary | ICD-10-CM | POA: Insufficient documentation

## 2017-03-21 DIAGNOSIS — Z85828 Personal history of other malignant neoplasm of skin: Secondary | ICD-10-CM | POA: Insufficient documentation

## 2017-03-21 DIAGNOSIS — Z9861 Coronary angioplasty status: Secondary | ICD-10-CM | POA: Insufficient documentation

## 2017-03-21 DIAGNOSIS — R531 Weakness: Principal | ICD-10-CM

## 2017-03-21 DIAGNOSIS — I252 Old myocardial infarction: Secondary | ICD-10-CM | POA: Diagnosis not present

## 2017-03-21 DIAGNOSIS — Z7901 Long term (current) use of anticoagulants: Secondary | ICD-10-CM | POA: Diagnosis not present

## 2017-03-21 DIAGNOSIS — Z86711 Personal history of pulmonary embolism: Secondary | ICD-10-CM | POA: Insufficient documentation

## 2017-03-21 DIAGNOSIS — Z833 Family history of diabetes mellitus: Secondary | ICD-10-CM | POA: Insufficient documentation

## 2017-03-21 DIAGNOSIS — Z885 Allergy status to narcotic agent status: Secondary | ICD-10-CM | POA: Insufficient documentation

## 2017-03-21 DIAGNOSIS — I491 Atrial premature depolarization: Secondary | ICD-10-CM | POA: Diagnosis not present

## 2017-03-21 DIAGNOSIS — Z950 Presence of cardiac pacemaker: Secondary | ICD-10-CM | POA: Diagnosis present

## 2017-03-21 DIAGNOSIS — Z9842 Cataract extraction status, left eye: Secondary | ICD-10-CM | POA: Insufficient documentation

## 2017-03-21 DIAGNOSIS — G319 Degenerative disease of nervous system, unspecified: Secondary | ICD-10-CM | POA: Insufficient documentation

## 2017-03-21 DIAGNOSIS — K219 Gastro-esophageal reflux disease without esophagitis: Secondary | ICD-10-CM | POA: Diagnosis not present

## 2017-03-21 DIAGNOSIS — T69021A Immersion foot, right foot, initial encounter: Secondary | ICD-10-CM | POA: Diagnosis not present

## 2017-03-21 DIAGNOSIS — Z886 Allergy status to analgesic agent status: Secondary | ICD-10-CM | POA: Insufficient documentation

## 2017-03-21 DIAGNOSIS — Z836 Family history of other diseases of the respiratory system: Secondary | ICD-10-CM | POA: Insufficient documentation

## 2017-03-21 DIAGNOSIS — R404 Transient alteration of awareness: Secondary | ICD-10-CM | POA: Diagnosis not present

## 2017-03-21 DIAGNOSIS — Z8249 Family history of ischemic heart disease and other diseases of the circulatory system: Secondary | ICD-10-CM | POA: Insufficient documentation

## 2017-03-21 DIAGNOSIS — I878 Other specified disorders of veins: Secondary | ICD-10-CM | POA: Insufficient documentation

## 2017-03-21 LAB — URINALYSIS, ROUTINE W REFLEX MICROSCOPIC
BILIRUBIN URINE: NEGATIVE
Bacteria, UA: NONE SEEN
Glucose, UA: NEGATIVE mg/dL
HGB URINE DIPSTICK: NEGATIVE
Ketones, ur: NEGATIVE mg/dL
Leukocytes, UA: NEGATIVE
NITRITE: NEGATIVE
PH: 7 (ref 5.0–8.0)
Protein, ur: NEGATIVE mg/dL
Specific Gravity, Urine: 1.004 — ABNORMAL LOW (ref 1.005–1.030)
Squamous Epithelial / LPF: NONE SEEN

## 2017-03-21 LAB — BASIC METABOLIC PANEL
Anion gap: 7 (ref 5–15)
BUN: 17 mg/dL (ref 6–20)
CALCIUM: 9.6 mg/dL (ref 8.9–10.3)
CO2: 30 mmol/L (ref 22–32)
CREATININE: 1.21 mg/dL (ref 0.61–1.24)
Chloride: 101 mmol/L (ref 101–111)
GFR calc Af Amer: 58 mL/min — ABNORMAL LOW (ref >60)
GFR, EST NON AFRICAN AMERICAN: 50 mL/min — AB (ref >60)
GLUCOSE: 119 mg/dL — AB (ref 65–99)
Potassium: 4.2 mmol/L (ref 3.5–5.1)
SODIUM: 138 mmol/L (ref 135–145)

## 2017-03-21 LAB — CBC
HCT: 43.2 % (ref 39.0–52.0)
Hemoglobin: 13.7 g/dL (ref 13.0–17.0)
MCH: 32.8 pg (ref 26.0–34.0)
MCHC: 31.7 g/dL (ref 30.0–36.0)
MCV: 103.3 fL — ABNORMAL HIGH (ref 78.0–100.0)
Platelets: 211 10*3/uL (ref 150–400)
RBC: 4.18 MIL/uL — ABNORMAL LOW (ref 4.22–5.81)
RDW: 14.6 % (ref 11.5–15.5)
WBC: 10.6 10*3/uL — ABNORMAL HIGH (ref 4.0–10.5)

## 2017-03-21 LAB — I-STAT TROPONIN, ED: TROPONIN I, POC: 0.01 ng/mL (ref 0.00–0.08)

## 2017-03-21 MED ORDER — SODIUM CHLORIDE 0.9 % IV BOLUS (SEPSIS)
500.0000 mL | Freq: Once | INTRAVENOUS | Status: AC
Start: 2017-03-21 — End: 2017-03-21
  Administered 2017-03-21: 500 mL via INTRAVENOUS

## 2017-03-21 MED ORDER — SODIUM CHLORIDE 0.9 % IV BOLUS (SEPSIS)
500.0000 mL | Freq: Once | INTRAVENOUS | Status: AC
  Administered 2017-03-21: 500 mL via INTRAVENOUS

## 2017-03-21 NOTE — ED Notes (Signed)
While performing orthostatic vital signs, pt required two EMTs/NTs to be assisted to standing position. While standing, pt was found to be unsteady and unable to stand without assistance.

## 2017-03-21 NOTE — ED Notes (Signed)
Attempted report 

## 2017-03-21 NOTE — ED Provider Notes (Signed)
I saw and evaluated the patient, reviewed the resident's note and I agree with the findings and plan.   EKG Interpretation None     83 year old male presents with worsening weakness 2 or 3 days. Stood up today and felt dizzy lightheaded and fell down. Did not pass out. No recent vomiting or diarrhea. Denies any head trauma from his fall today. Patient appears to be possibly dehydrated and will IV hydrate him as well as check laboratory studies   Lacretia Leigh, MD 03/21/17 1706

## 2017-03-21 NOTE — ED Notes (Signed)
Patient was unable to ambulate due to weakness.  MD notified.

## 2017-03-21 NOTE — ED Triage Notes (Addendum)
Pt from home via EMS with gen weakness x 2-3 days. Per EMS, pt has been feeling "woozy" and had difficulty walking x 2-3 days. EMS noted pt walk as a shuffle like gait. Per EMS, pt gait caused him to fall earlier today, no LOC but pt hit his head on the back of a chair, pt on coumadin. Pt denies SOB, CP, confusion. A&Ox4. 136/70, 132 CBG, HR 60-70, 87% on RA. 18 G OL forearm. Slight aasymmetry to L side of face, per family this is baseline for pt.

## 2017-03-21 NOTE — ED Notes (Addendum)
CT called about pt scan, per CT pt will be transported in approx 20 mins.

## 2017-03-21 NOTE — ED Notes (Signed)
ED Provider at bedside. 

## 2017-03-21 NOTE — ED Notes (Signed)
Patient given applesauce, [place] sandwhich after swallow screen.

## 2017-03-21 NOTE — ED Notes (Signed)
Patient transported to X-ray 

## 2017-03-21 NOTE — ED Provider Notes (Signed)
[facility] DEPT Provider Note   CSN: [id number] Arrival date & time: 03/21/17  1615     History   Chief Complaint Chief Complaint  Patient presents with  . Weakness  . Fall    HPI [name] is a 83 y.o. male with history of CAD (s/p CABG 1990), CHF, and AF (on coumadin) who presents with a fall and impact to head.  At home this afternoon he stood up from his chair with the help of his walker, but felt lightheaded and fell to his right side, striking his head on the chair on the way down.  He did not lose consciousness, and only had minor pain in his head for a minute or two.  After falling, his son helped him up, he ate lunch, then came to the ED to evaluation.  His son said that he was leaning to the right and not moving his right leg well.  He is now asymptomatic while lying in stretcher, denies headache and head trauma.  For the past 2-3 weeks he has been having some generalized weakness, fatigue, cough, and has not been eating or drinking well, worse in the last couple of days.  No nausea, vomiting, diarrhea, or dysuria.  He reports history of chronic balance issue vertigo.  Lives alone, has family in town and home health 2x per week, ambulates with walker.  HPI  Past Medical History:  Diagnosis Date  . Arrhythmia    afib  . Arthritis    "back and hands" (09/26/2015)  . Basal cell carcinoma   . CHF (congestive heart failure) ([place])   . Coronary artery disease   . CVI (common variable immunodeficiency) ([place])   . DJD (degenerative joint disease)   . ED (erectile dysfunction)   . GERD (gastroesophageal reflux disease)   . Gout   . Heart attack ([place]) 1986  . Hyperlipidemia   . Hypertension   . IFG (impaired fasting glucose)   . Neuropathy   . Non Hodgkin's lymphoma ([place]) dx'd 2006  . PAC (premature atrial contraction)   . PE (pulmonary embolism) 2007   "when I was taking chemo"  . Peptic ulcer disease   . Pneumonia ~ 2007 X 2  . Squamous carcinoma   .  Venous stasis ulcers [facility])     Patient Active Problem List   Diagnosis Date Noted  . Fever in adult 09/26/2015  . Gout 09/26/2015  . Leukocytosis 09/26/2015  . Venous stasis dermatitis of both lower extremities 07/17/2015  . Bilateral lower leg cellulitis 07/09/2015  . Sepsis due to other organism, without resultant organ failure 07/07/2015  . Chronic combined systolic and diastolic CHF (congestive heart failure) ([place]) 07/07/2015  . Onychomycosis 12/04/2013  . Hx of CABG 04/25/2013  . Coronary artery disease 11/29/2011  . Neuropathy 11/29/2011  . Generalized weakness 11/29/2011  . Atrial fibrillation ([place]) 12/02/2010  . PPM-St.Jude 12/02/2010    Past Surgical History:  Procedure Laterality Date  . BACK SURGERY    . CARDIAC CATHETERIZATION  1990  . CATARACT EXTRACTION W/ INTRAOCULAR LENS  IMPLANT, BILATERAL Bilateral 10/2006 ~ 11/2006  . CORONARY ANGIOPLASTY    . CORONARY ARTERY BYPASS GRAFT  1990   CABG X3  . ELBOW SURGERY Left    "related to gout"  . INSERT / REPLACE / REMOVE PACEMAKER  08/2010  . KNEE ARTHROSCOPY Left   . LUMBAR DISC SURGERY     "had pinched nerve; had to make room for it"  . NASAL SINUS SURGERY  1980   "related to bacteria infection in sinus"  . SQUAMOUS CELL CARCINOMA EXCISION Right 09/12/2015   cheek  . TONSILLECTOMY  1972       Home Medications    Prior to Admission medications   Medication Sig Start Date End Date Taking? Authorizing Provider  acetaminophen (TYLENOL) 500 MG tablet Take 500 mg by mouth at bedtime.    Yes [provider]  allopurinol (ZYLOPRIM) 300 MG tablet Take 300 mg by mouth daily.    Yes [provider]  ALPRAZolam Duanne Moron) 0.5 MG tablet Take 0.5 mg by mouth at bedtime. For sleep   Yes [provider]  aspirin 81 MG tablet Take 81 mg by mouth every other day.    Yes [provider]  atenolol (TENORMIN) 50 MG tablet Take 50 mg by mouth daily.    Yes [provider]    Cholecalciferol 2000 UNITS CAPS Take 4,000 Units by mouth daily.   Yes [provider]  colchicine 0.6 MG tablet Take 0.6 mg by mouth daily as needed (gout).    Yes [provider]  furosemide (LASIX) 80 MG tablet Take 80 mg by mouth daily. Or as directed   Yes [provider]  gabapentin (NEURONTIN) 100 MG capsule Take 100 mg by mouth 3 (three) times daily. Takes at 0900,1700,1900 daily   Yes [provider]  metolazone (ZAROXOLYN) 2.5 MG tablet Take 1.25mg  by mouth daily as needed for edema 04/22/15  Yes [provider]  potassium chloride SA (K-DUR,KLOR-CON) 20 MEQ tablet Take 40 mEq by mouth 2 (two) times daily.    Yes [provider]  triamcinolone cream (KENALOG) 0.1 % Apply 1 application topically daily as needed for rash. 03/10/17  Yes [provider]  warfarin (COUMADIN) 2.5 MG tablet Take 2.5 mg by mouth daily at 6 PM. Or as directed   Yes [provider]    Family History Family History  Problem Relation Age of Onset  . Diabetes type II Sister   . Heart disease Brother   . Diabetes type II Daughter     Social History Social History  Substance Use Topics  . Smoking status: Former Smoker    Packs/day: 1.00    Years: 1.00    Types: Cigarettes  . Smokeless tobacco: Never Used  . Alcohol use No     Allergies   Avelox [moxifloxacin hcl in nacl]; Indomethacin; Levaquin [levofloxacin]; Lipitor [atorvastatin]; Moxifloxacin; Pravachol [pravastatin sodium]; Aleve [naproxen sodium]; Norpace [disopyramide phosphate]; Disopyramide; Morphine; Pravastatin; Amlodipine besylate; Azithromycin; Bactrim [sulfamethoxazole-trimethoprim]; Clinoril [sulindac]; Codeine; Digoxin; Lorazepam; Morphine and related; Other; Oxycodone-acetaminophen; Percocet [oxycodone-acetaminophen]; Sulfamethoxazole-trimethoprim; Sulfonamide derivatives; and Uloric [febuxostat]   Review of Systems Review of Systems  Constitutional: Positive for  chills. Negative for fever.  Respiratory: Positive for cough. Negative for shortness of breath.   Cardiovascular: Negative for chest pain and palpitations.  Gastrointestinal: Positive for constipation. Negative for blood in stool, diarrhea and vomiting.  Genitourinary: Negative for decreased urine volume and dysuria.  Musculoskeletal: Negative for back pain, neck pain and neck stiffness.  Neurological: Positive for dizziness, weakness and light-headedness. Negative for syncope and headaches.     Physical Exam Updated Vital Signs BP 137/72   Pulse 63   Temp 98.3 F (36.8 C)   Resp 20   SpO2 97%   Physical Exam  Constitutional: He is oriented to person, place, and time. He appears well-developed and well-nourished. No distress.  HENT:  Head: Normocephalic and atraumatic.  Neck: Normal range of  motion. Neck supple.  Cardiovascular: Normal rate and regular rhythm.   Pulmonary/Chest: Effort normal. No respiratory distress.  R > L crackles in inferior lung fields  Musculoskeletal: He exhibits no edema or tenderness.  Neurological: He is alert and oriented to person, place, and time. No cranial nerve deficit or sensory deficit.  Strength and sensation grossly intact throughout all extremities  Skin:  LE with hyperpigmentation consistent with venous stasis LLE with ~5 cm diameter shallow ulcer, scant serous exudate, no bleeding, odor, pus, or surrounding erythema     ED Treatments / Results  Labs (all labs ordered are listed, but only abnormal results are displayed) Labs Reviewed  BASIC METABOLIC PANEL - Abnormal; Notable for the following:       Result Value   Glucose, Bld 119 (*)    GFR calc non Af Amer 50 (*)    GFR calc Af Amer 58 (*)    All other components within normal limits  CBC - Abnormal; Notable for the following:    WBC 10.6 (*)    RBC 4.18 (*)    MCV 103.3 (*)    All other components within normal limits  URINALYSIS, ROUTINE W REFLEX MICROSCOPIC - Abnormal;  Notable for the following:    Color, Urine COLORLESS (*)    Specific Gravity, Urine 1.004 (*)    All other components within normal limits  PROTIME-INR  I-STAT TROPOININ, ED    EKG  EKG Interpretation  Date/Time:  Sunday Mar 21 2017 16:25:36 EDT Ventricular Rate:  63 PR Interval:    QRS Duration: 163 QT Interval:  478 QTC Calculation: 478 R Axis:   -77 Text Interpretation:  Atrial fibrillation Nonspecific IVCD with LAD Borderline T abnormalities, lateral leads No significant change since last tracing Confirmed by ALLEN  MD, ANTHONY ([id number]) on 03/21/2017 6:51:29 PM       Radiology Dg Chest 2 View  Result Date: 03/21/2017 CLINICAL DATA:  Generalize weakness for 2-3 days. EXAM: CHEST  2 VIEW COMPARISON:  09/26/2015 chest x-ray a FINDINGS: The heart is upper limits of normal in size and stable. Stable pacer wires. Stable surgical changes from bypass surgery. The lungs are clear of an acute process. Stable calcified granulomas. Rounded density in the right lower chest is most likely a nipple shadow and is unchanged since prior examination. No infiltrates or effusions. The bony thorax is intact. IMPRESSION: No acute cardiopulmonary findings. Electronically Signed   By: Marijo Sanes M.D.   On: 03/21/2017 17:52   Ct Head Wo Contrast  Result Date: 03/21/2017 CLINICAL DATA:  Initial evaluation for acute trauma, fall. EXAM: CT HEAD WITHOUT CONTRAST TECHNIQUE: Contiguous axial images were obtained from the base of the skull through the vertex without intravenous contrast. COMPARISON:  Prior CT from 07/07/2015. FINDINGS: Brain: Generalized age-related cerebral atrophy with mild to moderate chronic microvascular ischemic disease. No acute intracranial hemorrhage. No acute large vessel territory infarct. No mass lesion, midline shift or mass effect. Diffuse ventricular prominence related global parenchymal volume loss of hydrocephalus. No extra-axial fluid collection. Vascular: No hyperdense vessel.  Scattered vascular calcifications noted within the carotid siphons and distal vertebral arteries. Skull: Small soft tissue contusion noted at the right occipital scalp. No other acute scalp soft tissue abnormality. Few scattered foci of soft tissue emphysema likely related to IV access. Calvarium intact. Sinuses/Orbits: Globes and orbital soft tissues demonstrate no acute abnormality. Remote defect noted at the left lamina for pre shift. Mild mucosal thickening within the right sphenoid sinus. Retained metallic  densities within the left sphenoid sinus, similar to prior. Paranasal sinuses otherwise clear. Trace right mastoid effusion. Other: None. IMPRESSION: 1. No acute intracranial process identified. 2. Small right suboccipital scalp contusion. 3. Generalized age-related cerebral atrophy with mild to moderate chronic small vessel ischemic disease, stable. Electronically Signed   By: Jeannine Boga M.D.   On: 03/21/2017 20:22    Procedures Procedures (including critical care time)  Medications Ordered in ED Medications  sodium chloride 0.9 % bolus 500 mL (0 mLs Intravenous Stopped 03/21/17 1805)  sodium chloride 0.9 % bolus 500 mL (0 mLs Intravenous Stopped 03/21/17 2110)     Initial Impression / Assessment and Plan / ED Course  I have reviewed the triage vital signs and the nursing notes.  Pertinent labs & imaging results that were available during my care of the patient were reviewed by me and considered in my medical decision making (see chart for details).  83 year old man on anticoagulation with presyncopal symptoms, right sided weakness, and a fall with minor head injury.  He is most likely orthostatic from poor PO intake and loop diuretic use, but could have underlying infection, UTI or atypical PNA.  He is now asymptomatic, neurologically intact, and has no signs of trauma, but high risk of intracranial bleed. -INR -Head CT -CXR -trop  Clinical Course as of Mar 21 2233  Sun Mar 21, 2017  1655 Borderline leukocytosis, AKI, no electrolyte abnormalities.  [MO]  9826 No UTI or PNA  [MO]  2025 Head CT without bleed or other intracranial abnormality.  With PPM unable to MRI.  [MO]  2212 Orthostatics negative but pt unable to ambulate even with 2x assist  [MO]  2219 Son reiterates that at baseline his father is ambulatory and that he is currently not at baseline.  Will consult hospitalist for admission for right sided weakness and gait instability.  [MO]    Clinical Course User Index [MO] Minus Liberty, MD     Final Clinical Impressions(s) / ED Diagnoses   Final diagnoses:  Orthostasis  Gait instability   Patient is unable to ambulate and son reports he is acutely off of his baseline.  Concern for acute stroke but unable to obtain MRI due to pacer.  New Prescriptions New Prescriptions   No medications on file     Minus Liberty, MD 03/21/17 2235

## 2017-03-21 NOTE — ED Notes (Signed)
To CT on monitor.

## 2017-03-22 ENCOUNTER — Observation Stay (HOSPITAL_BASED_OUTPATIENT_CLINIC_OR_DEPARTMENT_OTHER): Payer: Medicare Other

## 2017-03-22 DIAGNOSIS — R531 Weakness: Secondary | ICD-10-CM | POA: Diagnosis not present

## 2017-03-22 DIAGNOSIS — I5042 Chronic combined systolic (congestive) and diastolic (congestive) heart failure: Secondary | ICD-10-CM | POA: Diagnosis not present

## 2017-03-22 DIAGNOSIS — I255 Ischemic cardiomyopathy: Secondary | ICD-10-CM | POA: Diagnosis not present

## 2017-03-22 DIAGNOSIS — Z950 Presence of cardiac pacemaker: Secondary | ICD-10-CM | POA: Diagnosis not present

## 2017-03-22 DIAGNOSIS — I2510 Atherosclerotic heart disease of native coronary artery without angina pectoris: Secondary | ICD-10-CM | POA: Diagnosis not present

## 2017-03-22 DIAGNOSIS — L8990 Pressure ulcer of unspecified site, unspecified stage: Secondary | ICD-10-CM | POA: Insufficient documentation

## 2017-03-22 DIAGNOSIS — R2681 Unsteadiness on feet: Secondary | ICD-10-CM | POA: Diagnosis not present

## 2017-03-22 LAB — ECHOCARDIOGRAM COMPLETE

## 2017-03-22 LAB — PROTIME-INR
INR: 2.58
PROTHROMBIN TIME: 28.2 s — AB (ref 11.4–15.2)

## 2017-03-22 LAB — BRAIN NATRIURETIC PEPTIDE: B NATRIURETIC PEPTIDE 5: 127.7 pg/mL — AB (ref 0.0–100.0)

## 2017-03-22 MED ORDER — WARFARIN - PHARMACIST DOSING INPATIENT: Freq: Every day | Status: DC

## 2017-03-22 MED ORDER — FUROSEMIDE 40 MG PO TABS
60.0000 mg | ORAL_TABLET | Freq: Every day | ORAL | Status: DC
  Administered 2017-03-23: 60 mg via ORAL
  Filled 2017-03-22: qty 1

## 2017-03-22 MED ORDER — ACETAMINOPHEN 325 MG PO TABS
650.0000 mg | ORAL_TABLET | Freq: Four times a day (QID) | ORAL | Status: DC | PRN
  Administered 2017-03-22 – 2017-03-23 (×2): 650 mg via ORAL
  Filled 2017-03-22 (×2): qty 2

## 2017-03-22 MED ORDER — POTASSIUM CHLORIDE CRYS ER 20 MEQ PO TBCR
40.0000 meq | EXTENDED_RELEASE_TABLET | Freq: Two times a day (BID) | ORAL | Status: DC
  Administered 2017-03-22 – 2017-03-23 (×3): 40 meq via ORAL
  Filled 2017-03-22 (×3): qty 2

## 2017-03-22 MED ORDER — FUROSEMIDE 80 MG PO TABS: 80.0000 mg | ORAL_TABLET | Freq: Every day | ORAL | Status: DC

## 2017-03-22 MED ORDER — GABAPENTIN 100 MG PO CAPS
100.0000 mg | ORAL_CAPSULE | Freq: Three times a day (TID) | ORAL | Status: DC
  Administered 2017-03-22 – 2017-03-23 (×4): 100 mg via ORAL
  Filled 2017-03-22 (×5): qty 1

## 2017-03-22 MED ORDER — WARFARIN SODIUM 5 MG PO TABS
2.5000 mg | ORAL_TABLET | Freq: Once | ORAL | Status: AC
  Administered 2017-03-22: 2.5 mg via ORAL
  Filled 2017-03-22: qty 1

## 2017-03-22 MED ORDER — ASPIRIN EC 81 MG PO TBEC
81.0000 mg | DELAYED_RELEASE_TABLET | ORAL | Status: DC
  Administered 2017-03-22: 81 mg via ORAL
  Filled 2017-03-22 (×2): qty 1

## 2017-03-22 MED ORDER — ATENOLOL 25 MG PO TABS
50.0000 mg | ORAL_TABLET | Freq: Every day | ORAL | Status: DC
  Administered 2017-03-22 – 2017-03-23 (×2): 50 mg via ORAL
  Filled 2017-03-22 (×2): qty 2

## 2017-03-22 MED ORDER — ACETAMINOPHEN 650 MG RE SUPP: 650.0000 mg | Freq: Four times a day (QID) | RECTAL | Status: DC | PRN

## 2017-03-22 MED ORDER — SODIUM CHLORIDE 0.9% FLUSH
3.0000 mL | Freq: Two times a day (BID) | INTRAVENOUS | Status: DC
  Administered 2017-03-22 – 2017-03-23 (×4): 3 mL via INTRAVENOUS

## 2017-03-22 MED ORDER — ALLOPURINOL 100 MG PO TABS
300.0000 mg | ORAL_TABLET | Freq: Every day | ORAL | Status: DC
  Administered 2017-03-22 – 2017-03-23 (×2): 300 mg via ORAL
  Filled 2017-03-22 (×2): qty 3

## 2017-03-22 MED ORDER — ENOXAPARIN SODIUM 40 MG/0.4ML ~~LOC~~ SOLN
40.0000 mg | SUBCUTANEOUS | Status: DC
  Administered 2017-03-22: 40 mg via SUBCUTANEOUS
  Filled 2017-03-22: qty 0.4

## 2017-03-22 MED ORDER — ALPRAZOLAM 0.5 MG PO TABS
0.5000 mg | ORAL_TABLET | Freq: Every day | ORAL | Status: DC
  Administered 2017-03-22 (×2): 0.5 mg via ORAL
  Filled 2017-03-22 (×2): qty 1

## 2017-03-22 NOTE — Progress Notes (Signed)
PROGRESS NOTE    [name]  TMH:[id number] DOB: 1922/12/26 DOA: 03/21/2017 PCP: Lavone Orn, MD   Outpatient Specialists:     Brief Narrative:  [name] is a 83 y.o. male with a past medical history significant for remote NHL, chronic venous insuff with recurrent cellulitis, CAD s/p remote CABG, CHF EF 45%, chronic Afib on warfarin and pacer who presents with weakness for three days, somewhat generalized, now predominantly on the right.  The patient was in his normal state of health (lives alone, cannot climb stairs, dependent on walker, does not drive), until about three days ago, when he started to notice he was weaker and more fatigued in general.  He had a new dizziness that was mostly constant, not positional or with standing, and mild.  HH PT was at the house Friday for her last visit, told him he was shuffling. Then today, he and his son noticed after a nap this afternoon that he was just different, could barely stand which is abnormal for him, seemed to be leaning to the right, felt like his right leg was weaker   Assessment & Plan:   Principal Problem:   Weakness Active Problems:   Atrial fibrillation (HCC)   PPM-[place]   Coronary artery disease   Chronic combined systolic and diastolic CHF (congestive heart failure) (HCC)   Pressure injury of skin   Weakness, subjectively worse on right:  -MRI ordered if able with pacemaker -Obtain updated echocardiogram - tapering alprazolam -PT/OT eval and CM consult -If MR brain not possible, and symptoms persist, will d/w Neurology  Chronic systolic CHF:  EF 92% in 1194. -Continue aspirin -resume lasix   Atrial fibrillation:  CHADS2Vasc 3, on warfarin. -Continue warfarin -Continue atenolol   DVT prophylaxis:  Fully anticoagulated   Code Status: Full Code   Family Communication:   Disposition Plan:     Consultants:        Subjective: Says if he has to go to rehab, he would like  [facility]  Objective: Vitals:   03/22/17 0024 03/22/17 0548 03/22/17 0912 03/22/17 1327  BP: (!) 126/53 (!) 108/53 (!) 134/51 (!) 120/46  Pulse: 65 67 65 61  Resp: 18 18 20 16   Temp: 98.5 F (36.9 C) 98 F (36.7 C) 98 F (36.7 C) 98.2 F (36.8 C)  TempSrc: Oral Oral Oral Oral  SpO2: 97% 96% 96% 95%    Intake/Output Summary (Last 24 hours) at 03/22/17 1344 Last data filed at 03/22/17 1327  Gross per 24 hour  Intake             1740 ml  Output              420 ml  Net             1320 ml   There were no vitals filed for this visit.  Examination:  General exam: up with OT, NAD-- washing face Respiratory system: Clear to auscultation. Respiratory effort normal. Cardiovascular system: S1 & S2 heard, RRR. No JVD, murmurs, rubs, gallops or clicks. No pedal edema. Gastrointestinal system: Abdomen is nondistended, soft and nontender. No organomegaly or masses felt. Normal bowel sounds heard. Extremities: Symmetric 5 x 5 power. Skin: No rashes, lesions or ulcers Psychiatry: Judgement and insight appear normal. Mood & affect appropriate.     Data Reviewed: I have personally reviewed following labs and imaging studies  CBC:  Recent Labs Lab 03/21/17 1631  WBC 10.6*  HGB 13.7  HCT 43.2  MCV 103.3*  PLT 993   Basic Metabolic Panel:  Recent Labs Lab 03/21/17 1631  NA 138  K 4.2  CL 101  CO2 30  GLUCOSE 119*  BUN 17  CREATININE 1.21  CALCIUM 9.6   GFR: CrCl cannot be calculated (Unknown ideal weight.). Liver Function Tests: No results for input(s): AST, ALT, ALKPHOS, BILITOT, PROT, ALBUMIN in the last 168 hours. No results for input(s): LIPASE, AMYLASE in the last 168 hours. No results for input(s): AMMONIA in the last 168 hours. Coagulation Profile:  Recent Labs Lab 03/22/17 0521  INR 2.58   Cardiac Enzymes: No results for input(s): CKTOTAL, CKMB, CKMBINDEX, TROPONINI in the last 168 hours. BNP (last 3 results) No results for input(s): PROBNP in  the last 8760 hours. HbA1C: No results for input(s): HGBA1C in the last 72 hours. CBG: No results for input(s): GLUCAP in the last 168 hours. Lipid Profile: No results for input(s): CHOL, HDL, LDLCALC, TRIG, CHOLHDL, LDLDIRECT in the last 72 hours. Thyroid Function Tests: No results for input(s): TSH, T4TOTAL, FREET4, T3FREE, THYROIDAB in the last 72 hours. Anemia Panel: No results for input(s): VITAMINB12, FOLATE, FERRITIN, TIBC, IRON, RETICCTPCT in the last 72 hours. Urine analysis:    Component Value Date/Time   COLORURINE COLORLESS (A) 03/21/2017 1712   APPEARANCEUR CLEAR 03/21/2017 1712   LABSPEC 1.004 (L) 03/21/2017 1712   PHURINE 7.0 03/21/2017 1712   GLUCOSEU NEGATIVE 03/21/2017 1712   HGBUR NEGATIVE 03/21/2017 1712   BILIRUBINUR NEGATIVE 03/21/2017 1712   KETONESUR NEGATIVE 03/21/2017 1712   PROTEINUR NEGATIVE 03/21/2017 1712   UROBILINOGEN 0.2 07/08/2015 0428   NITRITE NEGATIVE 03/21/2017 1712   LEUKOCYTESUR NEGATIVE 03/21/2017 1712     )No results found for this or any previous visit (from the past 240 hour(s)).    Anti-infectives    None       Radiology Studies: Dg Chest 2 View  Result Date: 03/21/2017 CLINICAL DATA:  Generalize weakness for 2-3 days. EXAM: CHEST  2 VIEW COMPARISON:  09/26/2015 chest x-ray a FINDINGS: The heart is upper limits of normal in size and stable. Stable pacer wires. Stable surgical changes from bypass surgery. The lungs are clear of an acute process. Stable calcified granulomas. Rounded density in the right lower chest is most likely a nipple shadow and is unchanged since prior examination. No infiltrates or effusions. The bony thorax is intact. IMPRESSION: No acute cardiopulmonary findings. Electronically Signed   By: Marijo Sanes M.D.   On: 03/21/2017 17:52   Ct Head Wo Contrast  Result Date: 03/21/2017 CLINICAL DATA:  Initial evaluation for acute trauma, fall. EXAM: CT HEAD WITHOUT CONTRAST TECHNIQUE: Contiguous axial images were  obtained from the base of the skull through the vertex without intravenous contrast. COMPARISON:  Prior CT from 07/07/2015. FINDINGS: Brain: Generalized age-related cerebral atrophy with mild to moderate chronic microvascular ischemic disease. No acute intracranial hemorrhage. No acute large vessel territory infarct. No mass lesion, midline shift or mass effect. Diffuse ventricular prominence related global parenchymal volume loss of hydrocephalus. No extra-axial fluid collection. Vascular: No hyperdense vessel. Scattered vascular calcifications noted within the carotid siphons and distal vertebral arteries. Skull: Small soft tissue contusion noted at the right occipital scalp. No other acute scalp soft tissue abnormality. Few scattered foci of soft tissue emphysema likely related to IV access. Calvarium intact. Sinuses/Orbits: Globes and orbital soft tissues demonstrate no acute abnormality. Remote defect noted at the left lamina for pre shift. Mild mucosal thickening within the right sphenoid sinus. Retained metallic  densities within the left sphenoid sinus, similar to prior. Paranasal sinuses otherwise clear. Trace right mastoid effusion. Other: None. IMPRESSION: 1. No acute intracranial process identified. 2. Small right suboccipital scalp contusion. 3. Generalized age-related cerebral atrophy with mild to moderate chronic small vessel ischemic disease, stable. Electronically Signed   By: Jeannine Boga M.D.   On: 03/21/2017 20:22        Scheduled Meds: . allopurinol  300 mg Oral Daily  . ALPRAZolam  0.5 mg Oral QHS  . aspirin EC  81 mg Oral QODAY  . atenolol  50 mg Oral Daily  . gabapentin  100 mg Oral TID  . potassium chloride SA  40 mEq Oral BID  . sodium chloride flush  3 mL Intravenous Q12H  . warfarin  2.5 mg Oral ONCE-1800  . Warfarin - Pharmacist Dosing Inpatient   Does not apply q1800   Continuous Infusions:   LOS: 0 days    Time spent: 25 min    [place],  DO [facility] Pager [phone]  If 7PM-7AM, please contact night-coverage [email or web address] Password [email or web address] 03/22/2017, 1:44 PM

## 2017-03-22 NOTE — H&P (Signed)
History and Physical  Patient Name: [name]     QAS:[id number]    DOB: 15-Jun-1923    DOA: 03/21/2017 PCP: Lavone Orn, MD   Patient coming from: Home  Chief Complaint: Weakness  HPI: [name] is a 83 y.o. male with a past medical history significant for remote NHL, chronic venous insuff with recurrent cellulitis, CAD s/p remote CABG, CHF EF 45%, chronic Afib on warfarin and pacer who presents with weakness for three days, somewhat generalized, now predominantly on the right.  The patient was in his normal state of health (lives alone, cannot climb stairs, dependent on walker, does not drive), until about three days ago, when he started to notice he was weaker and more fatigued in general.  He had a new dizziness that was mostly constant, not positional or with standing, and mild.  HH PT was at the house Friday for her last visit, told him he was shuffling. Then today, he and his son noticed after a nap this afternoon that he was just different, could barely stand which is abnormal for him, seemed to be leaning to the right, felt like his right leg was weaker  ED course: -Afebrile, heart rate 64, respirations and pulse is normal, blood pressure 126/74 -Na 138, K 4.2, Cr 1.2 (baseline 0.9), WBC 10.6 K, Hgb 13.7 -Urinalysis clear -Troponin negative -CT head showed contusion, no acute intracranial hypermobility -Chest x-ray showed no change from previous, old interstitial markings consistent with emphysema -He was not orthostatic -He appeared to lean to the right when he was started required 2 person assist, so TRH was asked to evaluate for weakness     ROS: Review of Systems  Constitutional: Positive for malaise/fatigue. Negative for chills and fever.  Respiratory: Negative for cough.   Cardiovascular: Negative for chest pain.  Genitourinary: Negative for dysuria.  Neurological: Positive for dizziness, focal weakness and weakness. Negative for tingling, tremors, sensory  change, speech change, seizures, loss of consciousness and headaches.  All other systems reviewed and are negative.         Past Medical History:  Diagnosis Date  . Arrhythmia    afib  . Arthritis    "back and hands" (09/26/2015)  . Basal cell carcinoma   . CHF (congestive heart failure) ([place])   . Coronary artery disease   . CVI (common variable immunodeficiency) ([place])   . DJD (degenerative joint disease)   . ED (erectile dysfunction)   . GERD (gastroesophageal reflux disease)   . Gout   . Heart attack ([place]) 1986  . Hyperlipidemia   . Hypertension   . IFG (impaired fasting glucose)   . Neuropathy   . Non Hodgkin's lymphoma ([place]) dx'd 2006  . PAC (premature atrial contraction)   . PE (pulmonary embolism) 2007   "when I was taking chemo"  . Peptic ulcer disease   . Pneumonia ~ 2007 X 2  . Squamous carcinoma   . Venous stasis ulcers (HCC)     Past Surgical History:  Procedure Laterality Date  . BACK SURGERY    . CARDIAC CATHETERIZATION  1990  . CATARACT EXTRACTION W/ INTRAOCULAR LENS  IMPLANT, BILATERAL Bilateral 10/2006 ~ 11/2006  . CORONARY ANGIOPLASTY    . CORONARY ARTERY BYPASS GRAFT  1990   CABG X3  . ELBOW SURGERY Left    "related to gout"  . INSERT / REPLACE / REMOVE PACEMAKER  08/2010  . KNEE ARTHROSCOPY Left   . LUMBAR DISC SURGERY     "  had pinched nerve; had to make room for it"  . NASAL SINUS SURGERY  1980   "related to bacteria infection in sinus"  . SQUAMOUS CELL CARCINOMA EXCISION Right 09/12/2015   cheek  . TONSILLECTOMY  1972    Social History: Patient lives alone.  The patient walks with a walker.  He is from [facility], lived here the last few years near his son.  Wife died six years ago.  Minimal smoking history.  Allergies  Allergen Reactions  . Avelox [Moxifloxacin Hcl In Nacl] Other (See Comments)    Messed up lungs  . Indomethacin Other (See Comments)    Renal insufficiency  . Levaquin [Levofloxacin] Other (See Comments)    Messed up lungs    . Lipitor [Atorvastatin] Nausea Only, Palpitations and Other (See Comments)    Irregular heart beat also  . Moxifloxacin Other (See Comments)    Respiratory distress  . Pravachol [Pravastatin Sodium] Nausea Only, Palpitations and Other (See Comments)  . Aleve [Naproxen Sodium] Nausea And Vomiting and Other (See Comments)    GI Upset  . Norpace [Disopyramide Phosphate] Other (See Comments)    Urinary retention  . Disopyramide Other (See Comments)    Urinary retention  . Morphine Nausea Only    Irregular hear beat  . Pravastatin Nausea Only    Irregular heart beat  . Amlodipine Besylate Rash  . Azithromycin Rash  . Bactrim [Sulfamethoxazole-Trimethoprim] Rash  . Clinoril [Sulindac] Rash  . Codeine Other (See Comments)    unknown  . Digoxin Rash  . Lorazepam Other (See Comments)    unknown  . Morphine And Related Other (See Comments)    unknown  . Other Other (See Comments)    Tripilenomenon? Per paperwork  . Oxycodone-Acetaminophen Rash  . Percocet [Oxycodone-Acetaminophen] Rash  . Sulfamethoxazole-Trimethoprim Rash  . Sulfonamide Derivatives Rash  . Uloric [Febuxostat] Other (See Comments)    unknown    Family history: family history includes Diabetes type II in his daughter and sister; Heart disease in his brother; Lung disease in his father; Obesity in his mother.  Prior to Admission medications   Medication Sig Start Date End Date Taking? Authorizing Provider  acetaminophen (TYLENOL) 500 MG tablet Take 500 mg by mouth at bedtime.    Yes [provider]  allopurinol (ZYLOPRIM) 300 MG tablet Take 300 mg by mouth daily.    Yes [provider]  ALPRAZolam Duanne Moron) 0.5 MG tablet Take 0.5 mg by mouth at bedtime. For sleep   Yes [provider]  aspirin 81 MG tablet Take 81 mg by mouth every other day.    Yes [provider]  atenolol (TENORMIN) 50 MG tablet Take 50 mg by mouth daily.    Yes [provider]  Cholecalciferol 2000  UNITS CAPS Take 4,000 Units by mouth daily.   Yes [provider]  colchicine 0.6 MG tablet Take 0.6 mg by mouth daily as needed (gout).    Yes [provider]  furosemide (LASIX) 80 MG tablet Take 80 mg by mouth daily. Or as directed   Yes [provider]  gabapentin (NEURONTIN) 100 MG capsule Take 100 mg by mouth 3 (three) times daily. Takes at 0900,1700,1900 daily   Yes [provider]  metolazone (ZAROXOLYN) 2.5 MG tablet Take 1.25mg  by mouth daily as needed for edema 04/22/15  Yes [provider]  potassium chloride SA (K-DUR,KLOR-CON) 20 MEQ tablet Take 40 mEq by mouth 2 (two) times daily.    Yes [provider]  triamcinolone cream (KENALOG) 0.1 % Apply 1 application topically daily as needed for rash. 03/10/17  Yes [provider]  warfarin (COUMADIN) 2.5 MG tablet Take 2.5 mg by mouth daily at 6 PM. Or as directed   Yes [provider]       Physical Exam: BP (!) 126/53 (BP Location: Right Arm)   Pulse 65   Temp 98.5 F (36.9 C) (Oral)   Resp 18   SpO2 97%  General appearance: Elderly adult male, alert and in no acute distress.   Eyes: Anicteric, conjunctiva pink, lids and lashes normal. PERRL.    ENT: No nasal deformity, discharge, epistaxis.  Hearing normal. OP moist without lesions.   Neck: No neck masses.  Trachea midline.  No thyromegaly/tenderness. Lymph: No cervical or supraclavicular lymphadenopathy. Skin: Warm and dry.  No jaundice.  No suspicious rashes or lesions. Cardiac: RRR, nl S1-S2, no murmurs appreciated.  Capillary refill is brisk.  JVP not visible.  Trace LE edema, severe chronic brawny venous change.  Radial pulses 2+ and symmetric. Respiratory: Normal respiratory rate and rhythm.  CTAB without rales or wheezes. Abdomen: Abdomen soft.  No TTP. No ascites, distension, hepatosplenomegaly.   MSK: No deformities or effusions.  No cyanosis or clubbing. Neuro: Pupils are 3 mm and reactive to 2  mm.  Extraocular movements are intact, without nystagmus.  I do not appreciate a visual field decifit on gross testing.  Cranial nerve 5 is within normal limits.  Cranial nerve 7 is symmetrical.  Cranial nerve 8 is within normal limits.  Cranial nerves 9 and 10 reveal equal palate elevation.  Cranial nerve 11 reveals sternocleidomastoid strong.  Cranial nerve 12 is midline.  Motor strength testing is 5/5 in the upper and lower extremities bilaterally with normal motor, tone and bulk. Sensory examination is intact to light touch.   Finger-to-nose testing is somewhat dysmetric on both sides. The patient is oriented to time, place and person.  Speech is fluent.  Naming is grossly intact.  Recall, recent and remote, as well as general fund of knowledge seem within normal limits.  Attention span and concentration are within normal limits.    Psych: Sensorium intact and responding to questions, attention normal.  Behavior appropriate.  Affect normal.  Judgment and insight appear normal.     Labs on Admission:  I have personally reviewed following labs and imaging studies: CBC:  Recent Labs Lab 03/21/17 1631  WBC 10.6*  HGB 13.7  HCT 43.2  MCV 103.3*  PLT 660   Basic Metabolic Panel:  Recent Labs Lab 03/21/17 1631  NA 138  K 4.2  CL 101  CO2 30  GLUCOSE 119*  BUN 17  CREATININE 1.21  CALCIUM 9.6   GFR: CrCl cannot be calculated (Unknown ideal weight.).  Liver Function Tests: No results for input(s): AST, ALT, ALKPHOS, BILITOT, PROT, ALBUMIN in the last 168 hours. No results for input(s): LIPASE, AMYLASE in the last 168 hours. No results for input(s): AMMONIA in the last 168 hours. Coagulation Profile: No results for input(s): INR, PROTIME in the last 168 hours. Cardiac Enzymes: No results for input(s): CKTOTAL, CKMB, CKMBINDEX, TROPONINI in the last 168 hours. BNP (last 3 results) No results for input(s): PROBNP in the last 8760 hours. HbA1C: No results for input(s): HGBA1C in  the last 72 hours. CBG: No results for input(s): GLUCAP in the last 168 hours. Lipid Profile: No results for input(s): CHOL, HDL, LDLCALC, TRIG, CHOLHDL, LDLDIRECT in the last 72 hours.  Thyroid Function Tests: No results for input(s): TSH, T4TOTAL, FREET4, T3FREE, THYROIDAB in the last 72 hours. Anemia Panel: No results for input(s): VITAMINB12, FOLATE, FERRITIN, TIBC, IRON, RETICCTPCT in the last 72 hours. Sepsis Labs:  Invalid input(s): PROCALCITONIN, LACTICIDVEN No results found for this or any previous visit (from the past 240 hour(s)).       Radiological Exams on Admission: Personally reviewed CXR shows emphysema, CT head report reviewed: Dg Chest 2 View  Result Date: 03/21/2017 CLINICAL DATA:  Generalize weakness for 2-3 days. EXAM: CHEST  2 VIEW COMPARISON:  09/26/2015 chest x-ray a FINDINGS: The heart is upper limits of normal in size and stable. Stable pacer wires. Stable surgical changes from bypass surgery. The lungs are clear of an acute process. Stable calcified granulomas. Rounded density in the right lower chest is most likely a nipple shadow and is unchanged since prior examination. No infiltrates or effusions. The bony thorax is intact. IMPRESSION: No acute cardiopulmonary findings. Electronically Signed   By: Marijo Sanes M.D.   On: 03/21/2017 17:52   Ct Head Wo Contrast  Result Date: 03/21/2017 CLINICAL DATA:  Initial evaluation for acute trauma, fall. EXAM: CT HEAD WITHOUT CONTRAST TECHNIQUE: Contiguous axial images were obtained from the base of the skull through the vertex without intravenous contrast. COMPARISON:  Prior CT from 07/07/2015. FINDINGS: Brain: Generalized age-related cerebral atrophy with mild to moderate chronic microvascular ischemic disease. No acute intracranial hemorrhage. No acute large vessel territory infarct. No mass lesion, midline shift or mass effect. Diffuse ventricular prominence related global parenchymal volume loss of hydrocephalus. No  extra-axial fluid collection. Vascular: No hyperdense vessel. Scattered vascular calcifications noted within the carotid siphons and distal vertebral arteries. Skull: Small soft tissue contusion noted at the right occipital scalp. No other acute scalp soft tissue abnormality. Few scattered foci of soft tissue emphysema likely related to IV access. Calvarium intact. Sinuses/Orbits: Globes and orbital soft tissues demonstrate no acute abnormality. Remote defect noted at the left lamina for pre shift. Mild mucosal thickening within the right sphenoid sinus. Retained metallic densities within the left sphenoid sinus, similar to prior. Paranasal sinuses otherwise clear. Trace right mastoid effusion. Other: None. IMPRESSION: 1. No acute intracranial process identified. 2. Small right suboccipital scalp contusion. 3. Generalized age-related cerebral atrophy with mild to moderate chronic small vessel ischemic disease, stable. Electronically Signed   By: Jeannine Boga M.D.   On: 03/21/2017 20:22    EKG: Independently reviewed. Rate 63, chronic Afib.  Echocardiogram 2016: Report reviewed EF 45-50% PAP 38       Assessment/Plan  1. Weakness, subjectively worse on right:  He is vague about why he can't stand, seems to endorse both focal right sided weakness but also global weakness.  Not a significant vertigo component to me.  Differential might include stroke, deconditioning, CHF, dehydration, alprazolam use. -Obtain MR brain if pacer allows -Check BNP and if normal, given IVF, if elevated restart Lasix -Obtain updated echocardiogram -Recommended tapering alprazolam -PT/OT eval and CM consult -If MR brain not possible, and symptoms persist, will d/w Neurology   2. Chronic systolic CHF:  EF 52% in 8413. -Check BNP -Hold lasix until BNP results -Continue aspirin -Uses metolazone PRN  3. Atrial fibrillation:  CHADS2Vasc 3, on warfarin. -Continue warfarin -Continue atenolol  4. Other  medications:  -Continue allopruinol -Continue gabapentin       DVT prophylaxis: N/A  Code Status: FULL  Family Communication: Son at bedside  Disposition Plan: Anticipate further work up, PT/OT eval and  CM consult.  Likely home tomrorwo if testing normal Consults called: None Admission status: OBS At the point of initial evaluation, it is my clinical opinion that admission for OBSERVATION is reasonable and necessary because the patient's presenting complaints in the context of their chronic conditions represent sufficient risk of deterioration or significant morbidity to constitute reasonable grounds for close observation in the hospital setting, but that the patient may be medically stable for discharge from the hospital within 24 to 48 hours.    Medical decision making: Patient seen at 11:20 PM on 03/21/2017.  The patient was discussed with Dr. Inda Castle.  What exists of the patient's chart was reviewed in depth and summarized above.  Clinical condition: stable.        Edwin Dada [facility] Pager [phone]

## 2017-03-22 NOTE — Progress Notes (Signed)
Orthopedic Tech Progress Note Patient Details:  HARVARD ZEISS Sep 04, 1923 [id number]  Ortho Devices Type of Ortho Device: Ace wrap, Unna boot Ortho Device/Splint Interventions: Application   [place] Pink 03/22/2017, 5:51 PM

## 2017-03-22 NOTE — Progress Notes (Signed)
[facility]  Patient Status: Active (receiving services up to time of hospitalization)  [facility] is providing the following services: RN  If patient discharges after hours, please call [phone].   Edwinna Areola 03/22/2017, 12:56 PM

## 2017-03-22 NOTE — Clinical Social Work Note (Signed)
Clinical Social Work Assessment  Patient Details  Name: [name] MRN: [id number] Date of Birth: 08-12-1923  Date of referral:  03/22/17               Reason for consult:  Facility Placement, Discharge Planning                Permission sought to share information with:  Facility Sport and exercise psychologist, Family Supports Permission granted to share information::  Yes, Verbal Permission Granted  Name::     Insurance underwriter::  SNF  Relationship::  Son  Contact Information:     Housing/Transportation Living arrangements for the past 2 months:  [place] of Information:  Patient, Adult Children Patient Interpreter Needed:  None Criminal Activity/Legal Involvement Pertinent to Current Situation/Hospitalization:  No - Comment as needed Significant Relationships:  Adult Children Lives with:  Self Do you feel safe going back to the place where you live?  Yes Need for family participation in patient care:  No (Coment)  Care giving concerns:  Pt currently lives alone, and does not have support in home to care for him to recover from illness.   Social Worker assessment / plan:  CSW explained referral for SNF placement. Pt and son discussed how pt currently resides alone and doesn't have anyone in the home to provide care for him at the moment as he recovers from hospital stay. Pt and son requested referral to [facility] or [facility] for SNF. Pt discussed how he had a previous stay at [facility], as well as had his wife there at a time in the past. Pt acknowledged that his previous SNF placement was helpful in allowing him to return to his prior level of functioning. Pt and son discussed how the facilities would be conveniently located for family and friends to visit while he was completing his rehab.   Employment status:  Retired Nurse, adult PT Recommendations:  [place] / Referral to community resources:     Patient/Family's  Response to care:  Pt and son accepting of SNF placement.  Patient/Family's Understanding of and Emotional Response to Diagnosis, Current Treatment, and Prognosis:  Pt and son understanding of pt's increased needs as he ages. Pt hopeful that a short term rehab stay will allow him to build up strength to return home. Pt and son aware of CSW role in planning and facilitating discharge.  Emotional Assessment Appearance:  Appears stated age Attitude/Demeanor/Rapport:    Affect (typically observed):  Appropriate Orientation:  Oriented to Self, Oriented to Place, Oriented to  Time, Oriented to Situation Alcohol / Substance use:  Not Applicable Psych involvement (Current and /or in the community):  No (Comment)  Discharge Needs  Concerns to be addressed:  Care Coordination, Discharge Planning Concerns Readmission within the last 30 days:  No Current discharge risk:  Physical Impairment Barriers to Discharge:  Continued Medical Work up   [employer], [place] 03/22/2017, 4:20 PM

## 2017-03-22 NOTE — Progress Notes (Signed)
[place] for Warfarin  Indication: atrial fibrillation  Vital Signs: Temp: 98 F (36.7 C) (05/14 0912) Temp Source: Oral (05/14 0912) BP: 134/51 (05/14 0912) Pulse Rate: 65 (05/14 0912)  Labs:  Recent Labs  03/21/17 1631 03/22/17 0521  HGB 13.7  --   HCT 43.2  --   PLT 211  --   LABPROT  --  28.2*  INR  --  2.58  CREATININE 1.21  --    Medical History: Past Medical History:  Diagnosis Date  . Arrhythmia    afib  . Arthritis    "back and hands" (09/26/2015)  . Basal cell carcinoma   . CHF (congestive heart failure) ([place])   . Coronary artery disease   . CVI (common variable immunodeficiency) ([place])   . DJD (degenerative joint disease)   . ED (erectile dysfunction)   . GERD (gastroesophageal reflux disease)   . Gout   . Heart attack ([place]) 1986  . Hyperlipidemia   . Hypertension   . IFG (impaired fasting glucose)   . Neuropathy   . Non Hodgkin's lymphoma ([place]) dx'd 2006  . PAC (premature atrial contraction)   . PE (pulmonary embolism) 2007   "when I was taking chemo"  . Peptic ulcer disease   . Pneumonia ~ 2007 X 2  . Squamous carcinoma   . Venous stasis ulcers (HCC)    Assessment: 83 y/o M presents to ED with weakness/fall. Continuing PTA warfarin for Afib. INR is therapeutic at 2.58 this  Home Dose: 2.5mg  daily with last dose 5/12  Goal of Therapy:  INR 2-3 Monitor platelets by anticoagulation protocol: Yes   Plan:  Warfarin 2.5mg  tonight x1 Daily INR Monitor s/sx of bleeding  Andrey Cota. Diona Foley, PharmD, BCPS Clinical Pharmacist [phone] 03/22/2017,9:40 AM

## 2017-03-22 NOTE — Evaluation (Signed)
Physical Therapy Evaluation Patient Details Name: [name] MRN: [id number] DOB: 03/10/1923 Today's Date: 03/22/2017   History of Present Illness  Pt is a 83 y/o M with a past medical history significant for remote NHL, chronic venous insuff with recurrent cellulitis, CAD s/p remote CABG, CHF, chronic Afib on warfarin and pacer who presents with weakness on the right side and dizziness; CT showed small right suboccipital scalp contusion, Generalized age-related cerebral atrophy , MRI results pending  Clinical Impression  Orders received for PT evaluation. Patient demonstrates deficits in functional mobility as indicated below. Will benefit from continued skilled PT to address deficits and maximize function. Will see as indicated and progress as tolerated.  Given history of recent falls, inability to mobilize safely, and decreased caregiver support, feel patient will benefit from [place] rehabilitation at [facility].    Follow Up Recommendations SNF;Supervision/Assistance - 24 hour    Equipment Recommendations  None recommended by PT    Recommendations for Other Services       Precautions / Restrictions Precautions Precautions: Fall Restrictions Weight Bearing Restrictions: No      Mobility  Bed Mobility Overal bed mobility: Needs Assistance Bed Mobility: Sit to Supine       Sit to supine: Min assist   General bed mobility comments: min assist for elevation of LEs back to bed and repositioning in bed  Transfers Overall transfer level: Needs assistance Equipment used: Rolling walker (2 wheeled) Transfers: Sit to/from Stand Sit to Stand: Min assist         General transfer comment: for safety, assist for coming into full upright position, verbal cues for standing up straight; Pt reports prior to admission having R side weakness with tendency to fall towards R side, no LOB during session and no leaning noted during standing ADL tasks  Ambulation/Gait Ambulation/Gait  assistance: Min assist Ambulation Distance (Feet): 30 Feet Assistive device: Rolling walker (2 wheeled) Gait Pattern/deviations: Step-through pattern;Decreased stride length;Trunk flexed;Drifts right/left;Shuffle Gait velocity: decreased Gait velocity interpretation: <1.8 ft/sec, indicative of risk for recurrent falls General Gait Details: patient with poor ability to maintain upright and safe positioning with use of RW. Difficulty navigating around room without cues. Assist with stability.  Stairs            Wheelchair Mobility    Modified Rankin (Stroke Patients Only)       Balance Overall balance assessment: Needs assistance Sitting-balance support: Feet supported;No upper extremity supported Sitting balance-Leahy Scale: Fair     Standing balance support: Bilateral upper extremity supported;During functional activity Standing balance-Leahy Scale: Fair Standing balance comment: able to stand self supported during hygiene and functional task                              Pertinent Vitals/Pain Pain Assessment: Faces Faces Pain Scale: Hurts little more Pain Location: left LE (wound) Pain Descriptors / Indicators: Sore Pain Intervention(s): Monitored during session    Home Living Family/patient expects to be discharged to:: Private residence Living Arrangements: Alone Available Help at Discharge: Family;Available PRN/intermittently Type of Home: House Home Access: Ramped entrance (small lip to enter)     Home Layout: One level Home Equipment: Tub bench;Grab bars - tub/shower;Hand held shower head;Grab bars - toilet;Walker - 2 wheels;Walker - 4 wheels;Cane - single point;Wheelchair - manual;Bedside commode Additional Comments: has a Dance movement psychotherapist, reacher    Prior Function Level of Independence: Independent with assistive device(s)  Comments: Uses RW for functional mobility      Hand Dominance   Dominant Hand: Right    Extremity/Trunk  Assessment                Communication   Communication: HOH  Cognition Arousal/Alertness: Awake/alert Behavior During Therapy: WFL for tasks assessed/performed Overall Cognitive Status: Impaired/Different from baseline Area of Impairment: Attention;Following commands                   Current Attention Level: Sustained   Following Commands: Follows one step commands consistently       General Comments: per son, progressive decline over the past few weeks      General Comments      Exercises     Assessment/Plan    PT Assessment Patient needs continued PT services  PT Problem List Decreased strength;Decreased activity tolerance;Decreased balance;Decreased mobility;Decreased coordination;Decreased cognition;Decreased safety awareness       PT Treatment Interventions DME instruction;Gait training;Functional mobility training;Therapeutic activities;Therapeutic exercise;Balance training;Patient/family education    PT Goals (Current goals can be found in the Care Plan section)  Acute Rehab PT Goals Patient Stated Goal: regain independence  PT Goal Formulation: With patient/family Time For Goal Achievement: 04/05/17 Potential to Achieve Goals: Good    Frequency Min 2X/week   Barriers to discharge Decreased caregiver support      Co-evaluation               AM-PAC PT "6 Clicks" Daily Activity  Outcome Measure Difficulty turning over in bed (including adjusting bedclothes, sheets and blankets)?: Total Difficulty moving from lying on back to sitting on the side of the bed? : Total Difficulty sitting down on and standing up from a chair with arms (e.g., wheelchair, bedside commode, etc,.)?: Total Help needed moving to and from a bed to chair (including a wheelchair)?: A Little Help needed walking in hospital room?: A Little Help needed climbing 3-5 steps with a railing? : A Lot 6 Click Score: 11    End of Session Equipment Utilized During Treatment:  Gait belt Activity Tolerance: Patient tolerated treatment well Patient left: in bed;with call bell/phone within reach;with bed alarm set;with family/visitor present Nurse Communication: Mobility status PT Visit Diagnosis: Unsteadiness on feet (R26.81);History of falling (Z91.81)    Time: 1410-1430 PT Time Calculation (min) (ACUTE ONLY): 20 min   Charges:   PT Evaluation $PT Eval Moderate Complexity: 1 Procedure     PT G Codes:        Alben Deeds, PT DPT  [place] 03/22/2017, 5:34 PM

## 2017-03-22 NOTE — Consult Note (Signed)
[place] Nurse wound consult note Reason for Consult: LLE wound History of ulcer, treated by the wound care center at [facility] Palpable pulses bilaterally, hemosiderin staining bilaterally  Wound type: venous stasis ulcer Pressure Injury POA: No Measurement: 4cm x 3cm x 0.2cm  Wound EFE:OFHQR, ruddy, moist Drainage (amount, consistency, odor) minimal, no odor Periwound: intact  Dressing procedure/placement/frequency: Silicone foam to protect and manage drainage.  Unna's boots for compression therapy.  Added Prevalon boot for use when in bed to prevent lateral rotation.  Discussed POC with patient and bedside nurse.  Re consult if needed, will not follow at this time. Thanks  Aquita Simmering [employer], RN,CWOCN, CNS [phone])

## 2017-03-22 NOTE — NC FL2 (Signed)
[place] LEVEL OF CARE SCREENING TOOL     IDENTIFICATION  Patient Name: [name] Birthdate: [birth year] Sex: male Admission Date (Current Location): 03/21/2017  [facility] and [place] Number:  Herbalist and Address:  [facility], [place] [address], [place], [place] [address]      Provider Number: [id number]  Attending Physician Name and Address:  Geradine Girt, DO  Relative Name and Phone Number:       Current Level of Care: SNF Recommended Level of Care: [place] Prior Approval Number:    Date Approved/Denied:   PASRR Number: [id number]  Discharge Plan: SNF    Current Diagnoses: Patient Active Problem List   Diagnosis Date Noted  . Pressure injury of skin 03/22/2017  . Weakness 03/21/2017  . Fever in adult 09/26/2015  . Gout 09/26/2015  . Leukocytosis 09/26/2015  . Venous stasis dermatitis of both lower extremities 07/17/2015  . Bilateral lower leg cellulitis 07/09/2015  . Sepsis due to other organism, without resultant organ failure 07/07/2015  . Chronic combined systolic and diastolic CHF (congestive heart failure) ([place]) 07/07/2015  . Onychomycosis 12/04/2013  . Hx of CABG 04/25/2013  . Coronary artery disease 11/29/2011  . Neuropathy 11/29/2011  . Generalized weakness 11/29/2011  . Atrial fibrillation ([place]) 12/02/2010  . PPM-[place] 12/02/2010    Orientation RESPIRATION BLADDER Height & Weight     Self, Time, Situation, Place  Normal Continent Weight:   Height:     BEHAVIORAL SYMPTOMS/MOOD NEUROLOGICAL BOWEL NUTRITION STATUS      Continent    AMBULATORY STATUS COMMUNICATION OF NEEDS Skin   Limited Assist Verbally PU Stage and Appropriate Care   PU Stage 2 Dressing:  (Unknown)                   Personal Care Assistance Level of Assistance  Bathing, Dressing Bathing Assistance: Limited assistance   Dressing Assistance: Limited assistance     Functional Limitations Info              SPECIAL CARE FACTORS FREQUENCY  PT (By licensed PT), OT (By licensed OT)     PT Frequency: 5x/wk OT Frequency: 5x/wk            Contractures      Additional Factors Info  Code Status, Allergies Code Status Info: full Allergies Info: Avelox Moxifloxacin Hcl In Nacl, Indomethacin, Levaquin Levofloxacin, Lipitor Atorvastatin, Moxifloxacin, Pravachol Pravastatin Sodium, Aleve Naproxen Sodium, Norpace Disopyramide Phosphate, Disopyramide, Morphine, Pravastatin, Amlodipine Besylate, Azithromycin, Bactrim Sulfamethoxazole-trimethoprim, [place]il Sulindac, Codeine, Digoxin, Lorazepam, Morphine And Related, Other, Oxycodone-acetaminophen, Percocet Oxycodone-acetaminophen, Sulfamethoxazole-trimethoprim, Sulfonamide Derivatives, Uloric Febuxostat           Current Medications (03/22/2017):  This is the current hospital active medication list Current Facility-Administered Medications  Medication Dose Route Frequency Provider Last Rate Last Dose  . acetaminophen (TYLENOL) tablet 650 mg  650 mg Oral Q6H PRN Edwin Dada, MD   650 mg at 03/22/17 0110   Or  . acetaminophen (TYLENOL) suppository 650 mg  650 mg Rectal Q6H PRN Danford, Suann Larry, MD      . allopurinol (ZYLOPRIM) tablet 300 mg  300 mg Oral Daily Danford, Suann Larry, MD   300 mg at 03/22/17 1022  . ALPRAZolam Duanne Moron) tablet 0.5 mg  0.5 mg Oral QHS Danford, Suann Larry, MD   0.5 mg at 03/22/17 0110  . aspirin EC tablet 81 mg  81 mg Oral QODAY Danford, Suann Larry, MD  81 mg at 03/22/17 1000  . atenolol (TENORMIN) tablet 50 mg  50 mg Oral Daily Danford, Suann Larry, MD   50 mg at 03/22/17 1022  . [START ON 03/23/2017] furosemide (LASIX) tablet 60 mg  60 mg Oral Daily Vann, Jessica U, DO      . gabapentin (NEURONTIN) capsule 100 mg  100 mg Oral TID Edwin Dada, MD   100 mg at 03/22/17 1021  . potassium chloride SA (K-DUR,KLOR-CON) CR tablet 40 mEq  40 mEq Oral BID Edwin Dada, MD   40  mEq at 03/22/17 1021  . sodium chloride flush (NS) 0.9 % injection 3 mL  3 mL Intravenous Q12H Danford, Suann Larry, MD   3 mL at 03/22/17 1027  . warfarin (COUMADIN) tablet 2.5 mg  2.5 mg Oral ONCE-1800 Rebecka Apley, [place]      . Warfarin - Pharmacist Dosing Inpatient   Does not apply [place], [facility]         Discharge Medications: Please see discharge summary for a list of discharge medications.  Relevant Imaging Results:  Relevant Lab Results:   Additional Information SS#: [id number]  Geralynn Ochs, LCSW

## 2017-03-22 NOTE — Progress Notes (Signed)
ANTICOAGULATION CONSULT NOTE - Initial Consult  Pharmacy Consult for Warfarin  Indication: atrial fibrillation  Vital Signs: Temp: 98.5 F (36.9 C) (05/14 0024) Temp Source: Oral (05/14 0024) BP: 126/53 (05/14 0024) Pulse Rate: 65 (05/14 0024)  Labs:  Recent Labs  03/21/17 1631  HGB 13.7  HCT 43.2  PLT 211  CREATININE 1.21   Medical History: Past Medical History:  Diagnosis Date  . Arrhythmia    afib  . Arthritis    "back and hands" (09/26/2015)  . Basal cell carcinoma   . CHF (congestive heart failure) ([place])   . Coronary artery disease   . CVI (common variable immunodeficiency) ([place])   . DJD (degenerative joint disease)   . ED (erectile dysfunction)   . GERD (gastroesophageal reflux disease)   . Gout   . Heart attack ([place]) 1986  . Hyperlipidemia   . Hypertension   . IFG (impaired fasting glucose)   . Neuropathy   . Non Hodgkin's lymphoma ([place]) dx'd 2006  . PAC (premature atrial contraction)   . PE (pulmonary embolism) 2007   "when I was taking chemo"  . Peptic ulcer disease   . Pneumonia ~ 2007 X 2  . Squamous carcinoma   . Venous stasis ulcers (HCC)    Assessment: 83 y/o M presents to ED with weakness/fall. Continuing PTA warfarin for Afib. No INR yet.   Goal of Therapy:  INR 2-3 Monitor platelets by anticoagulation protocol: Yes   Plan:  -INR with AM labs to assess dosing needs  Narda Bonds 03/22/2017,1:28 AM

## 2017-03-22 NOTE — Evaluation (Signed)
Occupational Therapy Evaluation Patient Details Name: [name] MRN: [id number] DOB: February 04, 1923 Today's Date: 03/22/2017    History of Present Illness Pt is a 83 y/o M with a past medical history significant for remote NHL, chronic venous insuff with recurrent cellulitis, CAD s/p remote CABG, CHF, chronic Afib on warfarin and pacer who presents with weakness on the right side and dizziness; CT showed small right suboccipital scalp contusion, Generalized age-related cerebral atrophy , MRI results pending   Clinical Impression   This 83 y/o M presents with the above. At baseline Pt reports being ModI with ADLs using DME and AE for task completion. Pt receives assistance for some IADLs including community mobility, grocery shopping, and cleaning. Pt currently requires MinA for functional mobility and MaxA for LB ADLs. Pt lives alone and has intermittent assistance/support from family. Pt will benefit from continued acute OT services and post acute OT services to maximize safety and independence with ADLs and functional mobility upon return home. Will continue to follow.     Follow Up Recommendations  SNF;Supervision/Assistance - 24 hour    Equipment Recommendations  None recommended by OT           Precautions / Restrictions        Mobility Bed Mobility Overal bed mobility: Needs Assistance Bed Mobility: Supine to Sit     Supine to sit: Min assist     General bed mobility comments: for scooting to EOB  Transfers Overall transfer level: Needs assistance Equipment used: Rolling walker (2 wheeled) Transfers: Sit to/from Stand Sit to Stand: Min assist         General transfer comment: for safety, assist for coming into full upright position, verbal cues for standing up straight; Pt reports prior to admission having R side weakness with tendency to fall towards R side, no LOB during session and no leaning noted during standing ADL tasks    Balance Overall balance  assessment: Needs assistance Sitting-balance support: Feet supported;No upper extremity supported Sitting balance-Leahy Scale: Fair     Standing balance support: Bilateral upper extremity supported;During functional activity Standing balance-Leahy Scale: Fair                             ADL either performed or assessed with clinical judgement   ADL Overall ADL's : Needs assistance/impaired Eating/Feeding: Independent;Sitting   Grooming: Wash/dry hands;Wash/dry face;Min guard;Standing   Upper Body Bathing: Min guard;Sitting   Lower Body Bathing: Minimal assistance;Sit to/from stand   Upper Body Dressing : Min guard;Sitting   Lower Body Dressing: Maximal assistance;With adaptive equipment;Sit to/from stand Lower Body Dressing Details (indicate cue type and reason): donning socks; Pt reports he uses sock aide for completing task at home  Toilet Transfer: Minimal assistance;Ambulation;Regular Toilet;RW   Toileting- Water quality scientist and Hygiene: Min guard;Sit to/from stand       Functional mobility during ADLs: Minimal assistance;Rolling walker General ADL Comments: Pt completed room level functional mobility, standing grooming ADLs, toilet transfer, toileting, requires increased time to complete tasks      Vision Baseline Vision/History: Wears glasses;Cataracts Wears Glasses: At all times Patient Visual Report: No change from baseline Vision Assessment?: Yes Eye Alignment: Within Functional Limits Ocular Range of Motion: Within Functional Limits                Pertinent Vitals/Pain Pain Assessment: Faces Pain Score: 0-No pain     Hand Dominance     Extremity/Trunk Assessment Upper Extremity Assessment  Upper Extremity Assessment: Generalized weakness;LUE deficits/detail           Communication Communication Communication: HOH   Cognition Arousal/Alertness: Awake/alert Behavior During Therapy: WFL for tasks assessed/performed Overall  Cognitive Status: No family/caregiver present to determine baseline cognitive functioning Area of Impairment: Attention;Following commands                   Current Attention Level: Sustained   Following Commands: Follows one step commands consistently       General Comments: Pt appears to be accurate historian of recent events, easily distracted, some difficulty with selective attention   General Comments                  Home Living Family/patient expects to be discharged to:: Private residence Living Arrangements: Alone Available Help at Discharge: Family;Available PRN/intermittently Type of Home: House Home Access: Ramped entrance (small lip to enter)     Home Layout: One level     Bathroom Shower/Tub: Teacher, early years/pre: Standard     Home Equipment: Tub bench;Grab bars - tub/shower;Hand held shower head;Grab bars - toilet;Walker - 2 wheels;Walker - 4 wheels;Cane - single point;Wheelchair - manual;Bedside commode   Additional Comments: has a Dance movement psychotherapist, reacher      Prior Functioning/Environment Level of Independence: Independent with assistive device(s)        Comments: Uses RW for functional mobility         OT Problem List: Decreased strength;Decreased activity tolerance;Impaired balance (sitting and/or standing)      OT Treatment/Interventions: Self-care/ADL training;Therapeutic exercise;DME and/or AE instruction;Therapeutic activities;Patient/family education;Balance training    OT Goals(Current goals can be found in the care plan section) Acute Rehab OT Goals Patient Stated Goal: regain independence  OT Goal Formulation: With patient Time For Goal Achievement: 04/05/17 Potential to Achieve Goals: Good ADL Goals Pt Will Perform Grooming: standing;with modified independence Pt Will Perform Lower Body Bathing: with supervision;sit to/from stand Pt Will Perform Lower Body Dressing: with min guard assist;with adaptive  equipment;sit to/from stand Pt Will Transfer to Toilet: with modified independence;ambulating;regular height toilet;grab bars Pt Will Perform Toileting - Clothing Manipulation and hygiene: sit to/from stand;with modified independence Pt Will Perform Tub/Shower Transfer: Tub transfer;with min guard assist;ambulating;tub bench;rolling walker Pt/caregiver will Perform Home Exercise Program: Increased strength;Both right and left upper extremity;With written HEP provided  OT Frequency: Min 2X/week                             AM-PAC PT "6 Clicks" Daily Activity     Outcome Measure Help from another person eating meals?: None Help from another person taking care of personal grooming?: A Little Help from another person toileting, which includes using toliet, bedpan, or urinal?: A Little Help from another person bathing (including washing, rinsing, drying)?: A Lot Help from another person to put on and taking off regular upper body clothing?: A Little Help from another person to put on and taking off regular lower body clothing?: A Lot 6 Click Score: 17   End of Session Equipment Utilized During Treatment: Gait belt;Rolling walker Nurse Communication: Mobility status  Activity Tolerance: Patient tolerated treatment well Patient left: in chair;with call bell/phone within reach  OT Visit Diagnosis: Muscle weakness (generalized) (M62.81);Unsteadiness on feet (R26.81);History of falling (Z91.81)                Time: 1025-1110 OT Time Calculation (min): 45 min Charges:  OT General Charges $  OT Visit: 1 Procedure OT Evaluation $OT Eval Low Complexity: 1 Procedure OT Treatments $Self Care/Home Management : 23-37 mins G-Codes: OT G-codes **NOT FOR INPATIENT CLASS** Functional Assessment Tool Used: AM-PAC 6 Clicks Daily Activity;Clinical judgement Functional Limitation: Self care Self Care Current Status (R4431): At least 40 percent but less than 60 percent impaired, limited or  restricted Self Care Goal Status (V4008): At least 20 percent but less than 40 percent impaired, limited or restricted   Lou Cal, OT Pager [phone] 03/22/2017   Raymondo Band 03/22/2017, 12:30 PM

## 2017-03-22 NOTE — Progress Notes (Signed)
Pt d/c to facility, no new concerns, d/c instruction with teach back given to pt's son. Hand over report given to facility nurse. Pt transported out of facility by [facility]

## 2017-03-22 NOTE — Progress Notes (Signed)
  Echocardiogram 2D Echocardiogram has been performed.  [name] 03/22/2017, 3:44 PM

## 2017-03-22 NOTE — Care Management Obs Status (Signed)
[place] NOTIFICATION   Patient Details  Name: [name] MRN: [id number] Date of Birth: 01/13/1923   Medicare Observation Status Notification Given:  Yes    Pollie Friar, RN 03/22/2017, 5:01 PM

## 2017-03-23 DIAGNOSIS — M6281 Muscle weakness (generalized): Secondary | ICD-10-CM | POA: Diagnosis not present

## 2017-03-23 DIAGNOSIS — Z950 Presence of cardiac pacemaker: Secondary | ICD-10-CM | POA: Diagnosis not present

## 2017-03-23 DIAGNOSIS — R531 Weakness: Secondary | ICD-10-CM | POA: Diagnosis not present

## 2017-03-23 DIAGNOSIS — I10 Essential (primary) hypertension: Secondary | ICD-10-CM | POA: Diagnosis not present

## 2017-03-23 DIAGNOSIS — E860 Dehydration: Secondary | ICD-10-CM | POA: Diagnosis not present

## 2017-03-23 DIAGNOSIS — S0003XA Contusion of scalp, initial encounter: Secondary | ICD-10-CM | POA: Diagnosis not present

## 2017-03-23 DIAGNOSIS — G629 Polyneuropathy, unspecified: Secondary | ICD-10-CM | POA: Diagnosis not present

## 2017-03-23 DIAGNOSIS — I83029 Varicose veins of left lower extremity with ulcer of unspecified site: Secondary | ICD-10-CM | POA: Diagnosis not present

## 2017-03-23 DIAGNOSIS — L97921 Non-pressure chronic ulcer of unspecified part of left lower leg limited to breakdown of skin: Secondary | ICD-10-CM | POA: Diagnosis not present

## 2017-03-23 DIAGNOSIS — R2681 Unsteadiness on feet: Secondary | ICD-10-CM | POA: Diagnosis not present

## 2017-03-23 DIAGNOSIS — M1A0790 Idiopathic chronic gout, unspecified ankle and foot, without tophus (tophi): Secondary | ICD-10-CM | POA: Diagnosis not present

## 2017-03-23 DIAGNOSIS — I951 Orthostatic hypotension: Secondary | ICD-10-CM

## 2017-03-23 DIAGNOSIS — I5042 Chronic combined systolic (congestive) and diastolic (congestive) heart failure: Secondary | ICD-10-CM | POA: Diagnosis not present

## 2017-03-23 DIAGNOSIS — L97929 Non-pressure chronic ulcer of unspecified part of left lower leg with unspecified severity: Secondary | ICD-10-CM | POA: Diagnosis not present

## 2017-03-23 DIAGNOSIS — L97928 Non-pressure chronic ulcer of unspecified part of left lower leg with other specified severity: Secondary | ICD-10-CM | POA: Diagnosis not present

## 2017-03-23 DIAGNOSIS — I83229 Varicose veins of left lower extremity with both ulcer of unspecified site and inflammation: Secondary | ICD-10-CM | POA: Diagnosis not present

## 2017-03-23 DIAGNOSIS — I83028 Varicose veins of left lower extremity with ulcer other part of lower leg: Secondary | ICD-10-CM | POA: Diagnosis not present

## 2017-03-23 DIAGNOSIS — I2510 Atherosclerotic heart disease of native coronary artery without angina pectoris: Secondary | ICD-10-CM | POA: Diagnosis not present

## 2017-03-23 DIAGNOSIS — I482 Chronic atrial fibrillation: Secondary | ICD-10-CM | POA: Diagnosis not present

## 2017-03-23 DIAGNOSIS — Z7901 Long term (current) use of anticoagulants: Secondary | ICD-10-CM | POA: Diagnosis not present

## 2017-03-23 DIAGNOSIS — I5022 Chronic systolic (congestive) heart failure: Secondary | ICD-10-CM | POA: Diagnosis not present

## 2017-03-23 DIAGNOSIS — I87312 Chronic venous hypertension (idiopathic) with ulcer of left lower extremity: Secondary | ICD-10-CM | POA: Diagnosis not present

## 2017-03-23 DIAGNOSIS — R2689 Other abnormalities of gait and mobility: Secondary | ICD-10-CM | POA: Diagnosis not present

## 2017-03-23 DIAGNOSIS — L89899 Pressure ulcer of other site, unspecified stage: Secondary | ICD-10-CM | POA: Diagnosis not present

## 2017-03-23 DIAGNOSIS — I83228 Varicose veins of left lower extremity with both ulcer of other part of lower extremity and inflammation: Secondary | ICD-10-CM | POA: Diagnosis not present

## 2017-03-23 DIAGNOSIS — R278 Other lack of coordination: Secondary | ICD-10-CM | POA: Diagnosis not present

## 2017-03-23 DIAGNOSIS — I110 Hypertensive heart disease with heart failure: Secondary | ICD-10-CM | POA: Diagnosis not present

## 2017-03-23 DIAGNOSIS — I4891 Unspecified atrial fibrillation: Secondary | ICD-10-CM | POA: Diagnosis not present

## 2017-03-23 DIAGNOSIS — D72829 Elevated white blood cell count, unspecified: Secondary | ICD-10-CM | POA: Diagnosis not present

## 2017-03-23 LAB — PROTIME-INR
INR: 2.03
Prothrombin Time: 23.2 seconds — ABNORMAL HIGH (ref 11.4–15.2)

## 2017-03-23 MED ORDER — ALPRAZOLAM 0.5 MG PO TABS: 0.5000 mg | ORAL_TABLET | Freq: Every evening | ORAL | 0 refills | Status: DC | PRN

## 2017-03-23 MED ORDER — WARFARIN SODIUM 5 MG PO TABS: 2.5000 mg | ORAL_TABLET | Freq: Once | ORAL | Status: DC

## 2017-03-23 NOTE — Care Management Note (Signed)
Case Management Note  Patient Details  Name: [name] MRN: [id number] Date of Birth: 1923/10/23  Subjective/Objective:                    Action/Plan: Pt discharging to SNF. No further needs per CM.   Expected Discharge Date:  03/23/17               Expected Discharge Plan:  Skilled Nursing Facility  In-House Referral:  Clinical Social Work  Discharge planning Services     Post Acute Care Choice:    Choice offered to:     DME Arranged:    DME Agency:     HH Arranged:    [place] Agency:     Status of Service:  Completed, signed off  If discussed at [employer], dates discussed:    Additional Comments:  Pollie Friar, RN 03/23/2017, 2:43 PM

## 2017-03-23 NOTE — Progress Notes (Addendum)
Pt was administered half tablet of potassium, started coughing for approximately 5-54min. Medication came back up with smear blood tinged. Medication administered with AS. Lungs sound clear. No respiratory distress noted.

## 2017-03-23 NOTE — Progress Notes (Signed)
Discharge to: [facility] Anticipated discharge date: 03/23/17 Family notified: Yes, at bedside Transportation by: Family car  [place] signing off.  Laveda Abbe LCSW [phone]

## 2017-03-23 NOTE — Progress Notes (Signed)
[facility] for Warfarin  Indication: atrial fibrillation  Vital Signs: Temp: 98.1 F (36.7 C) (05/15 1025) Temp Source: Oral (05/15 1025) BP: 139/62 (05/15 1025) Pulse Rate: 61 (05/15 1025)  Labs:  Recent Labs  03/21/17 1631 03/22/17 0521 03/23/17 0747  HGB 13.7  --   --   HCT 43.2  --   --   PLT 211  --   --   LABPROT  --  28.2* 23.2*  INR  --  2.58 2.03  CREATININE 1.21  --   --    Medical History: Past Medical History:  Diagnosis Date  . Arrhythmia    afib  . Arthritis    "back and hands" (09/26/2015)  . Basal cell carcinoma   . CHF (congestive heart failure) ([place])   . Coronary artery disease   . CVI (common variable immunodeficiency) ([place])   . DJD (degenerative joint disease)   . ED (erectile dysfunction)   . GERD (gastroesophageal reflux disease)   . Gout   . Heart attack ([place]) 1986  . Hyperlipidemia   . Hypertension   . IFG (impaired fasting glucose)   . Neuropathy   . Non Hodgkin's lymphoma ([place]) dx'd 2006  . PAC (premature atrial contraction)   . PE (pulmonary embolism) 2007   "when I was taking chemo"  . Peptic ulcer disease   . Pneumonia ~ 2007 X 2  . Squamous carcinoma   . Venous stasis ulcers (HCC)    Assessment: 83 y/o M presents to ED with weakness/fall. Continuing PTA warfarin for Afib. INR remains therapeutic at 2.03 after resuming home regimen.  Home Dose: 2.5mg  daily with last dose 5/12  Goal of Therapy:  INR 2-3 Monitor platelets by anticoagulation protocol: Yes   Plan:  Warfarin 2.5mg  tonight x1 Daily INR Monitor s/sx of bleeding  Andrey Cota. Diona Foley, PharmD, BCPS Clinical Pharmacist [phone] 03/23/2017,11:03 AM

## 2017-03-23 NOTE — Evaluation (Signed)
Clinical/Bedside Swallow Evaluation Patient Details  Name: [name] MRN: [id number] Date of Birth: 09-22-1923  Today's Date: 03/23/2017 Time: SLP Start Time (ACUTE ONLY): 0962 SLP Stop Time (ACUTE ONLY): 1206 SLP Time Calculation (min) (ACUTE ONLY): 23 min  Past Medical History:  Past Medical History:  Diagnosis Date  . Arrhythmia    afib  . Arthritis    "back and hands" (09/26/2015)  . Basal cell carcinoma   . CHF (congestive heart failure) ([place])   . Coronary artery disease   . CVI (common variable immunodeficiency) ([place])   . DJD (degenerative joint disease)   . ED (erectile dysfunction)   . GERD (gastroesophageal reflux disease)   . Gout   . Heart attack ([place]) 1986  . Hyperlipidemia   . Hypertension   . IFG (impaired fasting glucose)   . Neuropathy   . Non Hodgkin's lymphoma ([place]) dx'd 2006  . PAC (premature atrial contraction)   . PE (pulmonary embolism) 2007   "when I was taking chemo"  . Peptic ulcer disease   . Pneumonia ~ 2007 X 2  . Squamous carcinoma   . Venous stasis ulcers (HCC)    Past Surgical History:  Past Surgical History:  Procedure Laterality Date  . BACK SURGERY    . CARDIAC CATHETERIZATION  1990  . CATARACT EXTRACTION W/ INTRAOCULAR LENS  IMPLANT, BILATERAL Bilateral 10/2006 ~ 11/2006  . CORONARY ANGIOPLASTY    . CORONARY ARTERY BYPASS GRAFT  1990   CABG X3  . ELBOW SURGERY Left    "related to gout"  . INSERT / REPLACE / REMOVE PACEMAKER  08/2010  . KNEE ARTHROSCOPY Left   . LUMBAR DISC SURGERY     "had pinched nerve; had to make room for it"  . NASAL SINUS SURGERY  1980   "related to bacteria infection in sinus"  . SQUAMOUS CELL CARCINOMA EXCISION Right 09/12/2015   cheek  . TONSILLECTOMY  1972   HPI:  Pt is a 83 y/o M with a past medical history significant for remote NHL, chronic venous insuff with recurrent cellulitis, CAD s/p remote CABG, CHF, chronic Afib on warfarin and pacer who presents with weakness on the right side and  dizziness; CT showed small right suboccipital scalp contusion, Generalized age-related cerebral atrophy. SLP swallow evaluation was ordered when pt had trouble swallowing a potassium pill.   Assessment / Plan / Recommendation Clinical Impression  Pt has intermittent coughing after larger sips of thin liquid, which is alleviated with Mod cues from SLP to take smaller, single sips. He reports that this has been a more chronic problem but denies any recent h/o PNA. Recommend to continue current diet (regular textures, thin liquids) but with no straws and intermittent supervision for use of recommended strategies to increase safety. He would benefit from f/u at [facility] as well. SLP Visit Diagnosis: Dysphagia, unspecified (R13.10)    Aspiration Risk  Mild aspiration risk    Diet Recommendation Regular;Thin liquid   Liquid Administration via: Cup;No straw Medication Administration: Whole meds with liquid Supervision: Patient able to self feed;Intermittent supervision to cue for compensatory strategies Compensations: Slow rate;Small sips/bites Postural Changes: Seated upright at 90 degrees    Other  Recommendations Oral Care Recommendations: Oral care BID   Follow up Recommendations Skilled Nursing facility      Frequency and Duration min 2x/week  1 week       Prognosis Prognosis for Safe Diet Advancement: Fair Barriers to Reach Goals: Time post onset  Swallow Study   General HPI: Pt is a 83 y/o M with a past medical history significant for remote NHL, chronic venous insuff with recurrent cellulitis, CAD s/p remote CABG, CHF, chronic Afib on warfarin and pacer who presents with weakness on the right side and dizziness; CT showed small right suboccipital scalp contusion, Generalized age-related cerebral atrophy. SLP swallow evaluation was ordered when pt had trouble swallowing a potassium pill. Type of Study: Bedside Swallow Evaluation Previous Swallow Assessment: none in chart Diet  Prior to this Study: Regular;Thin liquids Temperature Spikes Noted: No Respiratory Status: Room air History of Recent Intubation: No Behavior/Cognition: Alert;Cooperative Oral Cavity Assessment: Within Functional Limits Oral Care Completed by SLP: No Oral Cavity - Dentition: Poor condition;Missing dentition Vision: Functional for self-feeding Self-Feeding Abilities: Able to feed self Patient Positioning: Upright in chair Baseline Vocal Quality: Normal Volitional Swallow: Able to elicit    Oral/Motor/Sensory Function     Ice Chips Ice chips: Not tested   Thin Liquid Thin Liquid: Impaired Presentation: Cup;Self Fed;Straw Pharyngeal  Phase Impairments: Suspected delayed Swallow;Cough - Immediate    Nectar Thick Nectar Thick Liquid: Not tested   Honey Thick Honey Thick Liquid: Not tested   Puree Puree: Within functional limits Presentation: Self Fed;Spoon   Solid   GO   Solid: Within functional limits Presentation: Self Fed    Functional Assessment Tool Used: skilled clinical judgment Functional Limitations: Swallowing Swallow Current Status (L7989): At least 1 percent but less than 20 percent impaired, limited or restricted Swallow Goal Status [phone]): At least 1 percent but less than 20 percent impaired, limited or restricted   Germain Osgood 03/23/2017,12:39 PM   Germain Osgood, M.A. CCC-SLP [phone]

## 2017-03-23 NOTE — Progress Notes (Signed)
Have attempted report unsuccessfully.   Pt.'s family transporting and anxious to leave, will send with paperwork since unable to give report.

## 2017-03-23 NOTE — Discharge Summary (Signed)
Physician Discharge Summary  [name] QVZ:[id number] DOB: 1923/08/01 DOA: 03/21/2017  PCP: Lavone Orn, MD  Admit date: 03/21/2017 Discharge date: 03/23/2017   Recommendations for Outpatient Follow-Up:   Weight daily Periodic INR Wound care: Silicone foam to protect and manage drainage.  Unna's boots for compression therapy.  Added Prevalon boot for use when in bed to prevent lateral rotation.  Discharge Diagnosis:   Principal Problem:   Weakness Active Problems:   Atrial fibrillation (HCC)   PPM-St.Jude   Coronary artery disease   Chronic combined systolic and diastolic CHF (congestive heart failure) (HCC)   Pressure injury of skin   Discharge disposition:   SNF:  Discharge Condition: Improved.  Diet recommendation: Low sodium, heart healthy..  Wound care: None.   History of Present Illness:   [name] is a 83 y.o. male with a past medical history significant for remote NHL, chronic venous insuff with recurrent cellulitis, CAD s/p remote CABG, CHF EF 45%, chronic Afib on warfarin and pacer who presents with weakness for three days, somewhat generalized, now predominantly on the right.  The patient was in his normal state of health (lives alone, cannot climb stairs, dependent on walker, does not drive), until about three days ago, when he started to notice he was weaker and more fatigued in general.  He had a new dizziness that was mostly constant, not positional or with standing, and mild.  HH PT was at the house Friday for her last visit, told him he was shuffling. Then today, he and his son noticed after a nap this afternoon that he was just different, could barely stand which is abnormal for him, seemed to be leaning to the right, felt like his right leg was weaker   Hospital Course by Problem:   Weakness due to dehydration -MRI not able to be done due to pacemaker -echocardiogram: Left ventricle: The cavity size was normal. There was moderate  concentric hypertrophy. Systolic function was mildly to   moderately reduced. The estimated ejection fraction was in the   range of 40% to 45%. Diffuse hypokinesis. - Aortic valve: Trileaflet; normal thickness leaflets. There was no   regurgitation. - Mitral valve: There was trivial regurgitation. - Left atrium: The atrium was severely dilated. - Right ventricle: The cavity size was moderately dilated. Wall   thickness was normal. Systolic function was moderately reduced. - Right atrium: The atrium was mildly dilated. Pacer wire or   catheter noted in right atrium. - Tricuspid valve: There was moderate regurgitation. - Pulmonary arteries: Systolic pressure was mildly increased. PA   peak pressure: 39 mm Hg (S). - Inferior vena cava: The vessel was normal in size. - Pericardium, extracardiac: There was no pericardial effusion. -PT/OT eval- SNF symptoms resolved after IVF -per son, always happens after dose of metolazone- will d/c  Chronic systolic CHF: EF 32% in 9518. -Continue aspirin -resume lasix   Atrial fibrillation: CHADS2Vasc 3, on warfarin. -Continue warfarin -Continue atenolol    Medical Consultants:    None.   Discharge Exam:   Vitals:   03/23/17 0503 03/23/17 1025  BP: 137/62 139/62  Pulse:  61  Resp: 16 18  Temp: 98.5 F (36.9 C) 98.1 F (36.7 C)   Vitals:   03/22/17 2201 03/23/17 0100 03/23/17 0503 03/23/17 1025  BP: 130/63 (!) 129/57 137/62 139/62  Pulse:    61  Resp: 15 16 16 18   Temp: 98.2 F (36.8 C) 98.2 F (36.8 C) 98.5 F (36.9 C) 98.1  F (36.7 C)  TempSrc: Oral Oral Oral Oral  SpO2: 97% 95% 96% 96%    Gen:  NAD- feeling much better   The results of significant diagnostics from this hospitalization (including imaging, microbiology, ancillary and laboratory) are listed below for reference.     Procedures and Diagnostic Studies:   Dg Chest 2 View  Result Date: 03/21/2017 CLINICAL DATA:  Generalize weakness for 2-3 days.  EXAM: CHEST  2 VIEW COMPARISON:  09/26/2015 chest x-ray a FINDINGS: The heart is upper limits of normal in size and stable. Stable pacer wires. Stable surgical changes from bypass surgery. The lungs are clear of an acute process. Stable calcified granulomas. Rounded density in the right lower chest is most likely a nipple shadow and is unchanged since prior examination. No infiltrates or effusions. The bony thorax is intact. IMPRESSION: No acute cardiopulmonary findings. Electronically Signed   By: Marijo Sanes M.D.   On: 03/21/2017 17:52   Ct Head Wo Contrast  Result Date: 03/21/2017 CLINICAL DATA:  Initial evaluation for acute trauma, fall. EXAM: CT HEAD WITHOUT CONTRAST TECHNIQUE: Contiguous axial images were obtained from the base of the skull through the vertex without intravenous contrast. COMPARISON:  Prior CT from 07/07/2015. FINDINGS: Brain: Generalized age-related cerebral atrophy with mild to moderate chronic microvascular ischemic disease. No acute intracranial hemorrhage. No acute large vessel territory infarct. No mass lesion, midline shift or mass effect. Diffuse ventricular prominence related global parenchymal volume loss of hydrocephalus. No extra-axial fluid collection. Vascular: No hyperdense vessel. Scattered vascular calcifications noted within the carotid siphons and distal vertebral arteries. Skull: Small soft tissue contusion noted at the right occipital scalp. No other acute scalp soft tissue abnormality. Few scattered foci of soft tissue emphysema likely related to IV access. Calvarium intact. Sinuses/Orbits: Globes and orbital soft tissues demonstrate no acute abnormality. Remote defect noted at the left lamina for pre shift. Mild mucosal thickening within the right sphenoid sinus. Retained metallic densities within the left sphenoid sinus, similar to prior. Paranasal sinuses otherwise clear. Trace right mastoid effusion. Other: None. IMPRESSION: 1. No acute intracranial process  identified. 2. Small right suboccipital scalp contusion. 3. Generalized age-related cerebral atrophy with mild to moderate chronic small vessel ischemic disease, stable. Electronically Signed   By: Jeannine Boga M.D.   On: 03/21/2017 20:22     Labs:   Basic Metabolic Panel:  Recent Labs Lab 03/21/17 1631  NA 138  K 4.2  CL 101  CO2 30  GLUCOSE 119*  BUN 17  CREATININE 1.21  CALCIUM 9.6   GFR CrCl cannot be calculated (Unknown ideal weight.). Liver Function Tests: No results for input(s): AST, ALT, ALKPHOS, BILITOT, PROT, ALBUMIN in the last 168 hours. No results for input(s): LIPASE, AMYLASE in the last 168 hours. No results for input(s): AMMONIA in the last 168 hours. Coagulation profile  Recent Labs Lab 03/22/17 0521 03/23/17 0747  INR 2.58 2.03    CBC:  Recent Labs Lab 03/21/17 1631  WBC 10.6*  HGB 13.7  HCT 43.2  MCV 103.3*  PLT 211   Cardiac Enzymes: No results for input(s): CKTOTAL, CKMB, CKMBINDEX, TROPONINI in the last 168 hours. BNP: Invalid input(s): POCBNP CBG: No results for input(s): GLUCAP in the last 168 hours. D-Dimer No results for input(s): DDIMER in the last 72 hours. Hgb A1c No results for input(s): HGBA1C in the last 72 hours. Lipid Profile No results for input(s): CHOL, HDL, LDLCALC, TRIG, CHOLHDL, LDLDIRECT in the last 72 hours. Thyroid function studies No  results for input(s): TSH, T4TOTAL, T3FREE, THYROIDAB in the last 72 hours.  Invalid input(s): FREET3 Anemia work up No results for input(s): VITAMINB12, FOLATE, FERRITIN, TIBC, IRON, RETICCTPCT in the last 72 hours. Microbiology No results found for this or any previous visit (from the past 240 hour(s)).   Discharge Instructions:   Discharge Instructions    (HEART FAILURE PATIENTS) Call MD:  Anytime you have any of the following symptoms: 1) 3 pound weight gain in 24 hours or 5 pounds in 1 week 2) shortness of breath, with or without a dry hacking cough 3)  swelling in the hands, feet or stomach 4) if you have to sleep on extra pillows at night in order to breathe.    Complete by:  As directed    Diet - low sodium heart healthy    Complete by:  As directed    Discharge instructions    Complete by:  As directed    INR in 1 week   Increase activity slowly    Complete by:  As directed      Allergies as of 03/23/2017      Reactions   Avelox [moxifloxacin Hcl In Nacl] Other (See Comments)   Messed up lungs   Indomethacin Other (See Comments)   Renal insufficiency   Levaquin [levofloxacin] Other (See Comments)   Messed up lungs   Lipitor [atorvastatin] Nausea Only, Palpitations, Other (See Comments)   Irregular heart beat also   Moxifloxacin Other (See Comments)   Respiratory distress   Pravachol [pravastatin Sodium] Nausea Only, Palpitations, Other (See Comments)   Aleve [naproxen Sodium] Nausea And Vomiting, Other (See Comments)   GI Upset   Norpace [disopyramide Phosphate] Other (See Comments)   Urinary retention   Disopyramide Other (See Comments)   Urinary retention   Morphine Nausea Only   Irregular hear beat   Pravastatin Nausea Only   Irregular heart beat   Amlodipine Besylate Rash   Azithromycin Rash   Bactrim [sulfamethoxazole-trimethoprim] Rash   Clinoril [sulindac] Rash   Codeine Other (See Comments)   unknown   Digoxin Rash   Lorazepam Other (See Comments)   unknown   Morphine And Related Other (See Comments)   unknown   Other Other (See Comments)   Tripilenomenon? Per paperwork   Oxycodone-acetaminophen Rash   Percocet [oxycodone-acetaminophen] Rash   Sulfamethoxazole-trimethoprim Rash   Sulfonamide Derivatives Rash   Uloric [febuxostat] Other (See Comments)   unknown      Medication List    STOP taking these medications   metolazone 2.5 MG tablet Commonly known as:  ZAROXOLYN     TAKE these medications   acetaminophen 500 MG tablet Commonly known as:  TYLENOL Take 500 mg by mouth at bedtime.     allopurinol 300 MG tablet Commonly known as:  ZYLOPRIM Take 300 mg by mouth daily.   ALPRAZolam 0.5 MG tablet Commonly known as:  XANAX Take 1 tablet (0.5 mg total) by mouth at bedtime as needed for anxiety. For sleep What changed:  when to take this  reasons to take this   aspirin 81 MG tablet Take 81 mg by mouth every other day.   atenolol 50 MG tablet Commonly known as:  TENORMIN Take 50 mg by mouth daily.   Cholecalciferol 2000 units Caps Take 4,000 Units by mouth daily.   colchicine 0.6 MG tablet Take 0.6 mg by mouth daily as needed (gout).   furosemide 80 MG tablet Commonly known as:  LASIX Take 80 mg by mouth  daily. Or as directed   gabapentin 100 MG capsule Commonly known as:  NEURONTIN Take 100 mg by mouth 3 (three) times daily. Takes at 0900,1700,1900 daily   potassium chloride SA 20 MEQ tablet Commonly known as:  K-DUR,KLOR-CON Take 40 mEq by mouth 2 (two) times daily.   triamcinolone cream 0.1 % Commonly known as:  KENALOG Apply 1 application topically daily as needed for rash.   warfarin 2.5 MG tablet Commonly known as:  COUMADIN Take 2.5 mg by mouth daily at 6 PM. Or as directed      Follow-up Information    Lavone Orn, MD. Schedule an appointment as soon as possible for a visit in 1 week(s).   Specialty:  Internal Medicine Contact information: [address] [place] [address] [phone]            Time coordinating discharge: 35 min  Signed:  JESSICA Alison Stalling   [facility] 03/23/2017, 12:18 PM

## 2017-03-23 NOTE — Progress Notes (Signed)
Physical Therapy Treatment Patient Details Name: [name] MRN: [id number] DOB: September 06, 1923 Today's Date: 03/23/2017    History of Present Illness Pt is a 83 y/o M with a past medical history significant for remote NHL, chronic venous insuff with recurrent cellulitis, CAD s/p remote CABG, CHF, chronic Afib on warfarin and pacer who presents with weakness on the right side and dizziness; CT showed small right suboccipital scalp contusion, Generalized age-related cerebral atrophy , MRI results pending    PT Comments    Pt tolerated increased gait distance and BLE strengthening exercises. Frequent verbal cues for safety with mobility required. 24* supervision recommended.   Follow Up Recommendations  SNF;Supervision/Assistance - 24 hour     Equipment Recommendations       Recommendations for Other Services       Precautions / Restrictions Precautions Precautions: Fall Precaution Comments: 3 falls in past 1 year Restrictions Weight Bearing Restrictions: No    Mobility  Bed Mobility               General bed mobility comments: up in chair  Transfers Overall transfer level: Needs assistance Equipment used: Rolling walker (2 wheeled) Transfers: Sit to/from Stand Sit to Stand: Min guard         General transfer comment: VCs hand placement, sit to stand x5 reps  Ambulation/Gait Ambulation/Gait assistance: Min guard Ambulation Distance (Feet): 190 Feet Assistive device: Rolling walker (2 wheeled) Gait Pattern/deviations: Step-through pattern;Decreased stride length;Trunk flexed;Shuffle;Decreased step length - left Gait velocity: decreased   General Gait Details: patient with poor ability to maintain upright and safe positioning with use of RW. Frequent VCs positioning in RW, posture, step length.  HR 80s with walking, no LOB.    Stairs            Wheelchair Mobility    Modified Rankin (Stroke Patients Only)       Balance Overall balance  assessment: Needs assistance Sitting-balance support: Feet supported;No upper extremity supported Sitting balance-Leahy Scale: Fair     Standing balance support: Bilateral upper extremity supported;During functional activity Standing balance-Leahy Scale: Fair                              Cognition Arousal/Alertness: Awake/alert Behavior During Therapy: WFL for tasks assessed/performed Overall Cognitive Status: Impaired/Different from baseline Area of Impairment: Attention;Following commands;Safety/judgement;Memory                   Current Attention Level: Sustained Memory: Decreased short-term memory Following Commands: Follows one step commands consistently Safety/Judgement: Decreased awareness of safety     General Comments: per son, progressive decline over the past few weeks; requires frequent verbal cues for safety with use of RW      Exercises General Exercises - Lower Extremity Ankle Circles/Pumps: AROM;Both;Seated;20 reps Long Arc Quad: AROM;Both;10 reps;Seated Hip Flexion/Marching: AROM;Both;10 reps;Seated Heel Raises: AROM;Both;10 reps;Standing Mini-Sqauts: AROM;Both;10 reps;Standing    General Comments        Pertinent Vitals/Pain Pain Assessment: 0-10 Pain Score: 5  Pain Location: left LE (wound) Pain Descriptors / Indicators: Sore Pain Intervention(s): Limited activity within patient's tolerance;Monitored during session;Premedicated before session    Home Living                      Prior Function            PT Goals (current goals can now be found in the care plan section) Acute Rehab PT  Goals Patient Stated Goal: short term rehab then home PT Goal Formulation: With patient/family Time For Goal Achievement: 04/05/17 Potential to Achieve Goals: Good Progress towards PT goals: Progressing toward goals    Frequency    Min 2X/week      PT Plan Current plan remains appropriate    Co-evaluation               AM-PAC PT "6 Clicks" Daily Activity  Outcome Measure  Difficulty turning over in bed (including adjusting bedclothes, sheets and blankets)?: A Little Difficulty moving from lying on back to sitting on the side of the bed? : A Little Difficulty sitting down on and standing up from a chair with arms (e.g., wheelchair, bedside commode, etc,.)?: A Little Help needed moving to and from a bed to chair (including a wheelchair)?: A Little Help needed walking in hospital room?: A Little Help needed climbing 3-5 steps with a railing? : A Lot 6 Click Score: 17    End of Session Equipment Utilized During Treatment: Gait belt Activity Tolerance: Patient tolerated treatment well Patient left: in chair;with call bell/phone within reach;with family/visitor present Nurse Communication: Mobility status PT Visit Diagnosis: Unsteadiness on feet (R26.81);History of falling (Z91.81)     Time: 8022-3361 PT Time Calculation (min) (ACUTE ONLY): 40 min  Charges:  $Gait Training: 8-22 mins $Therapeutic Exercise: 8-22 mins $Therapeutic Activity: 8-22 mins                    G Codes:       Philomena Doheny 03/23/2017, 10:17 AM [phone]

## 2017-03-24 ENCOUNTER — Encounter: Payer: Medicare Other | Admitting: *Deleted

## 2017-03-24 ENCOUNTER — Encounter: Payer: Self-pay | Admitting: Internal Medicine

## 2017-03-24 ENCOUNTER — Telehealth: Payer: Self-pay | Admitting: Internal Medicine

## 2017-03-24 ENCOUNTER — Non-Acute Institutional Stay (SKILLED_NURSING_FACILITY): Payer: Medicare Other | Admitting: Internal Medicine

## 2017-03-24 ENCOUNTER — Telehealth: Payer: Self-pay | Admitting: Cardiology

## 2017-03-24 DIAGNOSIS — I482 Chronic atrial fibrillation, unspecified: Secondary | ICD-10-CM

## 2017-03-24 DIAGNOSIS — M1A0790 Idiopathic chronic gout, unspecified ankle and foot, without tophus (tophi): Secondary | ICD-10-CM

## 2017-03-24 DIAGNOSIS — I5042 Chronic combined systolic (congestive) and diastolic (congestive) heart failure: Secondary | ICD-10-CM

## 2017-03-24 DIAGNOSIS — E860 Dehydration: Secondary | ICD-10-CM | POA: Diagnosis not present

## 2017-03-24 DIAGNOSIS — R531 Weakness: Secondary | ICD-10-CM

## 2017-03-24 DIAGNOSIS — I2510 Atherosclerotic heart disease of native coronary artery without angina pectoris: Secondary | ICD-10-CM

## 2017-03-24 DIAGNOSIS — D72829 Elevated white blood cell count, unspecified: Secondary | ICD-10-CM | POA: Diagnosis not present

## 2017-03-24 DIAGNOSIS — G629 Polyneuropathy, unspecified: Secondary | ICD-10-CM

## 2017-03-24 NOTE — Telephone Encounter (Signed)
LMOVM reminding pt to send remote transmission.   

## 2017-03-24 NOTE — Progress Notes (Signed)
LOCATION: [name]  PCP: Lavone Orn, MD   Code Status: Full Code  Goals of care: Advanced Directive information Advanced Directives 03/22/2017  Does Patient Have a Medical Advance Directive? Yes  Type of Advance Directive Living will  Does patient want to make changes to medical advance directive? No - Patient declined  Copy of [place] in Chart? -  Would patient like information on creating a medical advance directive? -  Pre-existing out of facility DNR order (yellow form or pink MOST form) -       Extended Emergency Contact Information Primary Emergency Contact: [place] Mobile Phone: [phone] Relation: Son Secondary Emergency Contact: [place] [employer] Phone: [phone] Relation: Son   Allergies  Allergen Reactions  . Avelox [Moxifloxacin Hcl In Nacl] Other (See Comments)    Messed up lungs  . Indomethacin Other (See Comments)    Renal insufficiency  . Levaquin [Levofloxacin] Other (See Comments)    Messed up lungs  . Lipitor [Atorvastatin] Nausea Only, Palpitations and Other (See Comments)    Irregular heart beat also  . Moxifloxacin Other (See Comments)    Respiratory distress  . Pravachol [Pravastatin Sodium] Nausea Only, Palpitations and Other (See Comments)  . Aleve [Naproxen Sodium] Nausea And Vomiting and Other (See Comments)    GI Upset  . Norpace [Disopyramide Phosphate] Other (See Comments)    Urinary retention  . Disopyramide Other (See Comments)    Urinary retention  . Morphine Nausea Only    Irregular hear beat  . Pravastatin Nausea Only    Irregular heart beat  . Amlodipine Besylate Rash  . Azithromycin Rash  . Bactrim [Sulfamethoxazole-Trimethoprim] Rash  . Clinoril [Sulindac] Rash  . Codeine Other (See Comments)    unknown  . Digoxin Rash  . Lorazepam Other (See Comments)    unknown  . Morphine And Related Other (See Comments)   unknown  . Other Other (See Comments)    Tripilenomenon? Per paperwork  . Oxycodone-Acetaminophen Rash  . Percocet [Oxycodone-Acetaminophen] Rash  . Sulfamethoxazole-Trimethoprim Rash  . Sulfonamide Derivatives Rash  . Uloric [Febuxostat] Other (See Comments)    unknown    Chief Complaint  Patient presents with  . New Admit To SNF    New Admission Visit      HPI:  Patient is a 83 y.o. male seen today for short term rehabilitation post hospital admission from 03/21/17-03/23/17 with generalized weakness thought to be from dehydration. He received iv fluids. He had echocardiogram showing EF 40-45%. He is seen in his room today. He has PMH of chronic venous insufficiency, CAD, CHF, afib among others.   Review of Systems:  Constitutional: Negative for fever, chills, diaphoresis.  HENT: Negative for headache, congestion, nasal discharge, sore throat. Positive for difficulty swallowing with cold water only.   Eyes: Negative for eye pain, blurred vision, double vision and discharge. Wears glasses. Respiratory: Negative for cough, shortness of breath and wheezing.   Cardiovascular: Negative for chest pain, palpitations, leg swelling.  Gastrointestinal: Negative for heartburn, nausea, vomiting, abdominal pain, loss of appetite, melena. Last bowel movement was Sunday morning. Genitourinary: Negative for dysuria Musculoskeletal: Negative for back pain, fall in the facility.  Skin: Negative for itching, rash.  Neurological: Negative for dizziness. Psychiatric/Behavioral: Negative for depression   Past Medical History:  Diagnosis Date  . Arrhythmia    afib  . Arthritis    "back and hands" (09/26/2015)  . Basal cell carcinoma   .  CHF (congestive heart failure) ([facility])   . Coronary artery disease   . CVI (common variable immunodeficiency) ([facility])   . DJD (degenerative joint disease)   . ED (erectile dysfunction)   . GERD (gastroesophageal reflux disease)   . Gout   . Heart attack ([facility]) 1986   . Hyperlipidemia   . Hypertension   . IFG (impaired fasting glucose)   . Neuropathy   . Non Hodgkin's lymphoma ([facility] [place] [facility]) dx'd 2006  . PAC (premature atrial contraction)   . PE (pulmonary embolism) 2007   "when I was taking chemo"  . Peptic ulcer disease   . Pneumonia ~ 2007 X 2  . Squamous carcinoma   . Venous stasis ulcers ([facility])    Past Surgical History:  Procedure Laterality Date  . BACK SURGERY    . CARDIAC CATHETERIZATION  1990  . CATARACT EXTRACTION W/ INTRAOCULAR LENS  IMPLANT, BILATERAL Bilateral 10/2006 ~ 11/2006  . CORONARY ANGIOPLASTY    . CORONARY ARTERY BYPASS GRAFT  1990   CABG X3  . ELBOW SURGERY Left    "related to gout"  . INSERT / REPLACE / REMOVE PACEMAKER  08/2010  . KNEE ARTHROSCOPY Left   . LUMBAR DISC SURGERY     "had pinched nerve; had to make room for it"  . NASAL SINUS SURGERY  1980   "related to bacteria infection in sinus"  . SQUAMOUS CELL CARCINOMA EXCISION Right 09/12/2015   cheek  . TONSILLECTOMY  1972   Social History:   reports that he has quit smoking. His smoking use included Cigarettes. He has a 1.00 pack-year smoking history. He has never used smokeless tobacco. He reports that he does not drink alcohol or use drugs.  Family History  Problem Relation Age of Onset  . Obesity Mother   . Lung disease Father   . Diabetes type II Sister   . Heart disease Brother   . Diabetes type II Daughter     Medications: Allergies as of 03/24/2017      Reactions   Avelox [moxifloxacin Hcl In Nacl] Other (See Comments)   Messed up lungs   Indomethacin Other (See Comments)   Renal insufficiency   Levaquin [levofloxacin] Other (See Comments)   Messed up lungs   Lipitor [atorvastatin] Nausea Only, Palpitations, Other (See Comments)   Irregular heart beat also   Moxifloxacin Other (See Comments)   Respiratory distress   Pravachol [pravastatin Sodium] Nausea Only, Palpitations, Other (See Comments)   Aleve [naproxen Sodium] Nausea And Vomiting, Other  (See Comments)   GI Upset   Norpace [disopyramide Phosphate] Other (See Comments)   Urinary retention   Disopyramide Other (See Comments)   Urinary retention   Morphine Nausea Only   Irregular hear beat   Pravastatin Nausea Only   Irregular heart beat   Amlodipine Besylate Rash   Azithromycin Rash   Bactrim [sulfamethoxazole-trimethoprim] Rash   Clinoril [sulindac] Rash   Codeine Other (See Comments)   unknown   Digoxin Rash   Lorazepam Other (See Comments)   unknown   Morphine And Related Other (See Comments)   unknown   Other Other (See Comments)   Tripilenomenon? Per paperwork   Oxycodone-acetaminophen Rash   Percocet [oxycodone-acetaminophen] Rash   Sulfamethoxazole-trimethoprim Rash   Sulfonamide Derivatives Rash   Uloric [febuxostat] Other (See Comments)   unknown      Medication List       Accurate as of 03/24/17 12:11 PM. Always use your most recent med list.  acetaminophen 500 MG tablet Commonly known as:  TYLENOL Take 500 mg by mouth at bedtime.   allopurinol 300 MG tablet Commonly known as:  ZYLOPRIM Take 300 mg by mouth daily.   ALPRAZolam 0.5 MG tablet Commonly known as:  XANAX Take 1 tablet (0.5 mg total) by mouth at bedtime as needed for anxiety. For sleep   aspirin 81 MG tablet Take 81 mg by mouth every other day.   atenolol 50 MG tablet Commonly known as:  TENORMIN Take 50 mg by mouth daily.   Cholecalciferol 2000 units Caps Take 4,000 Units by mouth daily.   colchicine 0.6 MG tablet Take 0.6 mg by mouth daily as needed (gout).   furosemide 80 MG tablet Commonly known as:  LASIX Take 80 mg by mouth daily. Or as directed   gabapentin 100 MG capsule Commonly known as:  NEURONTIN Take 100 mg by mouth 3 (three) times daily. Takes at 0900,1700,1900 daily   potassium chloride SA 20 MEQ tablet Commonly known as:  K-DUR,KLOR-CON Take 40 mEq by mouth 2 (two) times daily.   triamcinolone cream 0.1 % Commonly known as:   KENALOG Apply 1 application topically daily as needed for rash.   warfarin 2.5 MG tablet Commonly known as:  COUMADIN Take 2.5 mg by mouth daily at 6 PM. Or as directed       Immunizations: Immunization History  Administered Date(s) Administered  . Influenza-Unspecified 08/12/2015  . PPD Test 03/23/2017     Physical Exam: Vitals:   03/24/17 1200  BP: 137/62  Pulse: 68  Resp: 20  Temp: 98.5 F (36.9 C)  TempSrc: Oral  SpO2: 99%  Weight: 192 lb (87.1 kg)  Height: 5\' 2"  (1.575 m)   Body mass index is 35.12 kg/m.  General- elderly male, obese, in no acute distress Head- normocephalic, atraumatic Nose- no nasal discharge Throat- moist mucus membrane, normal oropharynx, poor dentition, some drooling from left angle of mouth Eyes- PERRLA, EOMI, no pallor, no icterus, no discharge, normal conjunctiva, normal sclera Neck- no cervical lymphadenopathy Cardiovascular- normal s1,s2, no murmur Respiratory- bilateral clear to auscultation, no wheeze, no rhonchi, no crackles, no use of accessory muscles Abdomen- bowel sounds present, soft, non tender, no guarding or rigidity Musculoskeletal- able to move all 4 extremities, generalized weakness, 1+ leg edema Neurological- alert and oriented to person, place and time Skin- warm and dry, easy bruising Psychiatry- normal mood and affect    Labs reviewed: Basic Metabolic Panel:  Recent Labs  03/21/17 1631  NA 138  K 4.2  CL 101  CO2 30  GLUCOSE 119*  BUN 17  CREATININE 1.21  CALCIUM 9.6   CBC:  Recent Labs  03/21/17 1631  WBC 10.6*  HGB 13.7  HCT 43.2  MCV 103.3*  PLT 211   Cardiac Enzymes: No results for input(s): CKTOTAL, CKMB, CKMBINDEX, TROPONINI in the last 8760 hours. BNP: Invalid input(s): POCBNP CBG: No results for input(s): GLUCAP in the last 8760 hours.  Radiological Exams: Dg Chest 2 View  Result Date: 03/21/2017 CLINICAL DATA:  Generalize weakness for 2-3 days. EXAM: CHEST  2 VIEW  COMPARISON:  09/26/2015 chest x-ray a FINDINGS: The heart is upper limits of normal in size and stable. Stable pacer wires. Stable surgical changes from bypass surgery. The lungs are clear of an acute process. Stable calcified granulomas. Rounded density in the right lower chest is most likely a nipple shadow and is unchanged since prior examination. No infiltrates or effusions. The bony thorax is intact. IMPRESSION:  No acute cardiopulmonary findings. Electronically Signed   By: Marijo Sanes M.D.   On: 03/21/2017 17:52   Ct Head Wo Contrast  Result Date: 03/21/2017 CLINICAL DATA:  Initial evaluation for acute trauma, fall. EXAM: CT HEAD WITHOUT CONTRAST TECHNIQUE: Contiguous axial images were obtained from the base of the skull through the vertex without intravenous contrast. COMPARISON:  Prior CT from 07/07/2015. FINDINGS: Brain: Generalized age-related cerebral atrophy with mild to moderate chronic microvascular ischemic disease. No acute intracranial hemorrhage. No acute large vessel territory infarct. No mass lesion, midline shift or mass effect. Diffuse ventricular prominence related global parenchymal volume loss of hydrocephalus. No extra-axial fluid collection. Vascular: No hyperdense vessel. Scattered vascular calcifications noted within the carotid siphons and distal vertebral arteries. Skull: Small soft tissue contusion noted at the right occipital scalp. No other acute scalp soft tissue abnormality. Few scattered foci of soft tissue emphysema likely related to IV access. Calvarium intact. Sinuses/Orbits: Globes and orbital soft tissues demonstrate no acute abnormality. Remote defect noted at the left lamina for pre shift. Mild mucosal thickening within the right sphenoid sinus. Retained metallic densities within the left sphenoid sinus, similar to prior. Paranasal sinuses otherwise clear. Trace right mastoid effusion. Other: None. IMPRESSION: 1. No acute intracranial process identified. 2. Small  right suboccipital scalp contusion. 3. Generalized age-related cerebral atrophy with mild to moderate chronic small vessel ischemic disease, stable. Electronically Signed   By: Jeannine Boga M.D.   On: 03/21/2017 20:22    Assessment/Plan  Generalized weakness From deconditioning with dehydration. Will have him work with physical therapy and occupational therapy team to help with gait training and muscle strengthening exercises.fall precautions. Skin care. Encourage to be out of bed.   Dehydration  s/p iv fluids, maintain hydration, check bmp  Leukocytosis Likely reactive, check wbc curve  afib Controlled HR. Continue atenolol for rate control. Continue coumadin 2.5 mg daily for anticoagulation. Monitor INR.   Gout No recent flare up, c/w daily allopurinol and prn colchicine  CAD Chest pain free. Continue daily aspirin. Continue his atenolol  Chronic systolic and diastolic chf Continue atenolol and lasix. Continue kcl, check bmp and weight  Neuropathy On gabapentin, monitor, fall precautions    Goals of care: short term rehabilitation   Labs/tests ordered: cbc, bmp 03/29/17   Family/ staff Communication: reviewed care plan with patient and nursing supervisor    Blanchie Serve, MD Internal Medicine [place], [place] [address] Cell Phone (Monday-Friday 8 am - 5 pm): [phone] On Call: [phone] and follow prompts after 5 pm and on weekends Office Phone: [phone] Office Fax: [phone]

## 2017-03-24 NOTE — Telephone Encounter (Signed)
This was an error note

## 2017-03-25 ENCOUNTER — Encounter: Payer: Self-pay | Admitting: Cardiology

## 2017-03-25 NOTE — Progress Notes (Signed)
Letter  

## 2017-03-29 LAB — CBC AND DIFFERENTIAL
HCT: 42 % (ref 41–53)
Hemoglobin: 13.6 g/dL (ref 13.5–17.5)
Platelets: 224 10*3/uL (ref 150–399)
WBC: 8.2 10^3/mL

## 2017-03-29 LAB — BASIC METABOLIC PANEL
BUN: 25 mg/dL — AB (ref 4–21)
CREATININE: 1.1 mg/dL (ref 0.6–1.3)
GLUCOSE: 96 mg/dL
POTASSIUM: 3.9 mmol/L (ref 3.4–5.3)
SODIUM: 143 mmol/L (ref 137–147)

## 2017-04-01 DIAGNOSIS — L97921 Non-pressure chronic ulcer of unspecified part of left lower leg limited to breakdown of skin: Secondary | ICD-10-CM | POA: Diagnosis not present

## 2017-04-01 DIAGNOSIS — I83228 Varicose veins of left lower extremity with both ulcer of other part of lower extremity and inflammation: Secondary | ICD-10-CM | POA: Diagnosis not present

## 2017-04-01 DIAGNOSIS — I83229 Varicose veins of left lower extremity with both ulcer of unspecified site and inflammation: Secondary | ICD-10-CM | POA: Diagnosis not present

## 2017-04-01 DIAGNOSIS — I83028 Varicose veins of left lower extremity with ulcer other part of lower leg: Secondary | ICD-10-CM | POA: Diagnosis not present

## 2017-04-01 DIAGNOSIS — I10 Essential (primary) hypertension: Secondary | ICD-10-CM | POA: Diagnosis not present

## 2017-04-01 DIAGNOSIS — L97928 Non-pressure chronic ulcer of unspecified part of left lower leg with other specified severity: Secondary | ICD-10-CM | POA: Diagnosis not present

## 2017-04-01 DIAGNOSIS — L97929 Non-pressure chronic ulcer of unspecified part of left lower leg with unspecified severity: Secondary | ICD-10-CM | POA: Diagnosis not present

## 2017-04-01 DIAGNOSIS — I87312 Chronic venous hypertension (idiopathic) with ulcer of left lower extremity: Secondary | ICD-10-CM | POA: Diagnosis not present

## 2017-04-07 ENCOUNTER — Encounter: Payer: Self-pay | Admitting: Family

## 2017-04-07 ENCOUNTER — Non-Acute Institutional Stay (SKILLED_NURSING_FACILITY): Payer: Medicare Other | Admitting: Family

## 2017-04-07 DIAGNOSIS — I2510 Atherosclerotic heart disease of native coronary artery without angina pectoris: Secondary | ICD-10-CM

## 2017-04-07 DIAGNOSIS — L97929 Non-pressure chronic ulcer of unspecified part of left lower leg with unspecified severity: Secondary | ICD-10-CM

## 2017-04-07 DIAGNOSIS — I5042 Chronic combined systolic (congestive) and diastolic (congestive) heart failure: Secondary | ICD-10-CM

## 2017-04-07 DIAGNOSIS — R2681 Unsteadiness on feet: Secondary | ICD-10-CM

## 2017-04-07 DIAGNOSIS — G629 Polyneuropathy, unspecified: Secondary | ICD-10-CM

## 2017-04-07 DIAGNOSIS — I83029 Varicose veins of left lower extremity with ulcer of unspecified site: Secondary | ICD-10-CM

## 2017-04-07 DIAGNOSIS — I482 Chronic atrial fibrillation, unspecified: Secondary | ICD-10-CM

## 2017-04-07 NOTE — Progress Notes (Signed)
Location:  [facility] Room Number: 503T Place of Service:  [phone])  Provider: Marlowe Sax FNP-C   PCP: Lavone Orn, MD Patient Care Team: Lavone Orn, MD as PCP - General (Internal Medicine)  Extended Emergency Contact Information Primary Emergency Contact: Bernarda Caffey [place] of [place] Mobile Phone: [phone] Relation: Son Secondary Emergency Contact: Conley Canal States of [place] Mobile Phone: [phone] Relation: Son  Code Status: Full code  Goals of care:  Advanced Directive information Advanced Directives 04/07/2017  Does Patient Have a Medical Advance Directive? No  Type of Advance Directive -  Does patient want to make changes to medical advance directive? -  Copy of [place] in Chart? -  Would patient like information on creating a medical advance directive? -  Pre-existing out of facility DNR order (yellow form or pink MOST form) -     Allergies  Allergen Reactions  . Avelox [Moxifloxacin Hcl In Nacl] Other (See Comments)    Messed up lungs  . Indomethacin Other (See Comments)    Renal insufficiency  . Levaquin [Levofloxacin] Other (See Comments)    Messed up lungs  . Lipitor [Atorvastatin] Nausea Only, Palpitations and Other (See Comments)    Irregular heart beat also  . Moxifloxacin Other (See Comments)    Respiratory distress  . Pravachol [Pravastatin Sodium] Nausea Only, Palpitations and Other (See Comments)  . Aleve [Naproxen Sodium] Nausea And Vomiting and Other (See Comments)    GI Upset  . Norpace [Disopyramide Phosphate] Other (See Comments)    Urinary retention  . Disopyramide Other (See Comments)    Urinary retention  . Morphine Nausea Only    Irregular hear beat  . Pravastatin Nausea Only    Irregular heart beat  . Amlodipine Besylate Rash  . Azithromycin Rash  . Bactrim [Sulfamethoxazole-Trimethoprim] Rash  . Clinoril [Sulindac] Rash  . Codeine Other  (See Comments)    unknown  . Digoxin Rash  . Lorazepam Other (See Comments)    unknown  . Morphine And Related Other (See Comments)    unknown  . Other Other (See Comments)    Tripilenomenon? Per paperwork  . Oxycodone-Acetaminophen Rash  . Percocet [Oxycodone-Acetaminophen] Rash  . Sulfamethoxazole-Trimethoprim Rash  . Sulfonamide Derivatives Rash  . Uloric [Febuxostat] Other (See Comments)    unknown    Chief Complaint  Patient presents with  . Discharge Note    discharge home from [facility]     HPI:  83 y.o. male seen today at [facility] for discharge home.He was here for short term rehabilitation for post hospital admission from 03/21/2017-03/23/2017 with generalized weakness thought to be from dehydration. He received I.V fluids. He had echocardiogram showing EF 40-45%.He has a medical history of CHF, CAD,Afib, Gout, chronic venous insufficiency among other conditions. He is seen in his room today.he denies any acute issues this visit. He has had unremarkable stay here in rehab.  He has worked well with PT/OT now stable for discharge home.He will be discharged home with Home health PT/OT to continue with ROM, Exercise, Gait stability and muscle strengthening.He will also require a HHRN for left lateral leg venous ulcer management and INR monitoring. His next INR is due 04/12/2017 with goal of 2-3. He does not require any will DME he states has own FWW at home. Home health services will be arranged by facility social worker prior to discharge.He will be discharge from the facility with his  medications. Prescription medication will be written x 1 month then patient to follow up with PCP in 1-2 weeks.Facility staff report no new concerns.  Past Medical History:  Diagnosis Date  . Arrhythmia    afib  . Arthritis    "back and hands" (09/26/2015)  . Basal cell carcinoma   . CHF (congestive heart failure) ([place])   . Coronary artery disease   .  CVI (common variable immunodeficiency) ([place])   . DJD (degenerative joint disease)   . ED (erectile dysfunction)   . GERD (gastroesophageal reflux disease)   . Gout   . Heart attack ([place]) 1986  . Hyperlipidemia   . Hypertension   . IFG (impaired fasting glucose)   . Neuropathy   . Non Hodgkin's lymphoma ([place]) dx'd 2006  . PAC (premature atrial contraction)   . PE (pulmonary embolism) 2007   "when I was taking chemo"  . Peptic ulcer disease   . Pneumonia ~ 2007 X 2  . Squamous carcinoma   . Venous stasis ulcers (HCC)     Past Surgical History:  Procedure Laterality Date  . BACK SURGERY    . CARDIAC CATHETERIZATION  1990  . CATARACT EXTRACTION W/ INTRAOCULAR LENS  IMPLANT, BILATERAL Bilateral 10/2006 ~ 11/2006  . CORONARY ANGIOPLASTY    . CORONARY ARTERY BYPASS GRAFT  1990   CABG X3  . ELBOW SURGERY Left    "related to gout"  . INSERT / REPLACE / REMOVE PACEMAKER  08/2010  . KNEE ARTHROSCOPY Left   . LUMBAR DISC SURGERY     "had pinched nerve; had to make room for it"  . NASAL SINUS SURGERY  1980   "related to bacteria infection in sinus"  . SQUAMOUS CELL CARCINOMA EXCISION Right 09/12/2015   cheek  . TONSILLECTOMY  1972      reports that he has quit smoking. His smoking use included Cigarettes. He has a 1.00 pack-year smoking history. He has never used smokeless tobacco. He reports that he does not drink alcohol or use drugs. Social History   Social History  . Marital status: Widowed    Spouse name: N/A  . Number of children: N/A  . Years of education: N/A   Occupational History  . Not on file.   Social History Main Topics  . Smoking status: Former Smoker    Packs/day: 1.00    Years: 1.00    Types: Cigarettes  . Smokeless tobacco: Never Used  . Alcohol use No  . Drug use: No  . Sexual activity: No   Other Topics Concern  . Not on file   Social History Narrative  . No narrative on file    Allergies  Allergen Reactions  . Avelox [Moxifloxacin Hcl In  Nacl] Other (See Comments)    Messed up lungs  . Indomethacin Other (See Comments)    Renal insufficiency  . Levaquin [Levofloxacin] Other (See Comments)    Messed up lungs  . Lipitor [Atorvastatin] Nausea Only, Palpitations and Other (See Comments)    Irregular heart beat also  . Moxifloxacin Other (See Comments)    Respiratory distress  . Pravachol [Pravastatin Sodium] Nausea Only, Palpitations and Other (See Comments)  . Aleve [Naproxen Sodium] Nausea And Vomiting and Other (See Comments)    GI Upset  . Norpace [Disopyramide Phosphate] Other (See Comments)    Urinary retention  . Disopyramide Other (See Comments)    Urinary retention  . Morphine Nausea Only    Irregular hear beat  . Pravastatin  Nausea Only    Irregular heart beat  . Amlodipine Besylate Rash  . Azithromycin Rash  . Bactrim [Sulfamethoxazole-Trimethoprim] Rash  . Clinoril [Sulindac] Rash  . Codeine Other (See Comments)    unknown  . Digoxin Rash  . Lorazepam Other (See Comments)    unknown  . Morphine And Related Other (See Comments)    unknown  . Other Other (See Comments)    Tripilenomenon? Per paperwork  . Oxycodone-Acetaminophen Rash  . Percocet [Oxycodone-Acetaminophen] Rash  . Sulfamethoxazole-Trimethoprim Rash  . Sulfonamide Derivatives Rash  . Uloric [Febuxostat] Other (See Comments)    unknown    Pertinent  Health Maintenance Due  Topic Date Due  . PNA vac Low Risk Adult (1 of 2 - PCV13) 05/01/1990  . INFLUENZA VACCINE  06/09/2017    Medications: Allergies as of 04/07/2017      Reactions   Avelox [moxifloxacin Hcl In Nacl] Other (See Comments)   Messed up lungs   Indomethacin Other (See Comments)   Renal insufficiency   Levaquin [levofloxacin] Other (See Comments)   Messed up lungs   Lipitor [atorvastatin] Nausea Only, Palpitations, Other (See Comments)   Irregular heart beat also   Moxifloxacin Other (See Comments)   Respiratory distress   Pravachol [pravastatin Sodium] Nausea  Only, Palpitations, Other (See Comments)   Aleve [naproxen Sodium] Nausea And Vomiting, Other (See Comments)   GI Upset   Norpace [disopyramide Phosphate] Other (See Comments)   Urinary retention   Disopyramide Other (See Comments)   Urinary retention   Morphine Nausea Only   Irregular hear beat   Pravastatin Nausea Only   Irregular heart beat   Amlodipine Besylate Rash   Azithromycin Rash   Bactrim [sulfamethoxazole-trimethoprim] Rash   Clinoril [sulindac] Rash   Codeine Other (See Comments)   unknown   Digoxin Rash   Lorazepam Other (See Comments)   unknown   Morphine And Related Other (See Comments)   unknown   Other Other (See Comments)   Tripilenomenon? Per paperwork   Oxycodone-acetaminophen Rash   Percocet [oxycodone-acetaminophen] Rash   Sulfamethoxazole-trimethoprim Rash   Sulfonamide Derivatives Rash   Uloric [febuxostat] Other (See Comments)   unknown      Medication List       Accurate as of 04/07/17 11:58 PM. Always use your most recent med list.          acetaminophen 500 MG tablet Commonly known as:  TYLENOL Take 500 mg by mouth at bedtime.   allopurinol 300 MG tablet Commonly known as:  ZYLOPRIM Take 300 mg by mouth daily.   ALPRAZolam 0.5 MG tablet Commonly known as:  XANAX Take 1 tablet (0.5 mg total) by mouth at bedtime as needed for anxiety. For sleep   aspirin 81 MG tablet Take 81 mg by mouth every other day.   atenolol 50 MG tablet Commonly known as:  TENORMIN Take 50 mg by mouth daily.   Cholecalciferol 2000 units Caps Take 4,000 Units by mouth daily.   colchicine 0.6 MG tablet Take 0.6 mg by mouth daily as needed (gout).   furosemide 80 MG tablet Commonly known as:  LASIX Take 80 mg by mouth daily. Or as directed   gabapentin 100 MG capsule Commonly known as:  NEURONTIN Take 100 mg by mouth 3 (three) times daily. Takes at 0900,1700,1900 daily   polyethylene glycol packet Commonly known as:  MIRALAX / GLYCOLAX Take 17 g  by mouth daily as needed for mild constipation.   potassium chloride SA 20 MEQ  tablet Commonly known as:  K-DUR,KLOR-CON Take 40 mEq by mouth 2 (two) times daily.   sennosides-docusate sodium 8.6-50 MG tablet Commonly known as:  SENOKOT-S Take 2 tablets by mouth at bedtime.   triamcinolone cream 0.1 % Commonly known as:  KENALOG Apply 1 application topically daily as needed for rash.   warfarin 2.5 MG tablet Commonly known as:  COUMADIN Take 2.5 mg by mouth daily at 6 PM. Or as directed       Review of Systems  Constitutional: Negative for activity change, appetite change, chills, fatigue and fever.  HENT: Negative for congestion, rhinorrhea, sinus pain, sinus pressure, sneezing and sore throat.   Eyes: Negative.   Respiratory: Negative for cough, chest tightness, shortness of breath and wheezing.   Cardiovascular: Positive for leg swelling. Negative for chest pain and palpitations.  Gastrointestinal: Negative for abdominal distention, abdominal pain, constipation, diarrhea, nausea and vomiting.  Endocrine: Negative.   Genitourinary: Negative for dysuria, flank pain, frequency and urgency.  Musculoskeletal: Positive for gait problem.  Skin: Negative for color change, pallor and rash.       Left lateral leg venous ulcer managed by wound care Nurse   Neurological: Negative for dizziness, seizures, light-headedness and headaches.  Hematological: Does not bruise/bleed easily.  Psychiatric/Behavioral: Negative for agitation, confusion and sleep disturbance. The patient is not nervous/anxious.     Vitals:   04/07/17 1126  BP: (!) 143/78  Pulse: 69  Resp: 20  Temp: 97.6 F (36.4 C)  SpO2: 98%  Weight: 192 lb (87.1 kg)  Height: 5\' 2"  (1.575 m)   Body mass index is 35.12 kg/m. Physical Exam  Constitutional: He is oriented to person, place, and time. He appears well-developed and well-nourished. No distress.  HENT:  Head: Normocephalic.  Mouth/Throat: No oropharyngeal  exudate.  Eyes: Conjunctivae and EOM are normal. Pupils are equal, round, and reactive to light. Right eye exhibits no discharge. Left eye exhibits no discharge. No scleral icterus.  Neck: Normal range of motion. No JVD present. No thyromegaly present.  Cardiovascular: Normal rate, regular rhythm, normal heart sounds and intact distal pulses.  Exam reveals no gallop and no friction rub.   No murmur heard. Pulmonary/Chest: Effort normal and breath sounds normal. No respiratory distress. He has no wheezes. He has no rales.  Abdominal: Soft. Bowel sounds are normal. He exhibits no distension. There is no tenderness. There is no rebound and no guarding.  Musculoskeletal: He exhibits no tenderness.  Moves x 4 extremities. Left leg ulna boot in place venous ulcer not visualized. Right leg Ted hose in place.   Lymphadenopathy:    He has no cervical adenopathy.  Neurological: He is oriented to person, place, and time.  Skin: Skin is warm and dry. No rash noted. No erythema. No pallor.  Left leg ulna boot in place venous ulcer not visualized but per wound Nurse ulcer stable wound bed pink without any drainage.   Psychiatric: He has a normal mood and affect.    Labs reviewed: Basic Metabolic Panel:  Recent Labs  03/21/17 1631 03/29/17  NA 138 143  K 4.2 3.9  CL 101  --   CO2 30  --   GLUCOSE 119*  --   BUN 17 25*  CREATININE 1.21 1.1  CALCIUM 9.6  --    CBC:  Recent Labs  03/21/17 1631 03/29/17  WBC 10.6* 8.2  HGB 13.7 13.6  HCT 43.2 42  MCV 103.3*  --   PLT 211 224   Assessment/Plan:  1. Unsteady gait  Has worked well with PT/ OT. Will discharge home PT/OT to continue with ROM, Exercise, Gait stability and muscle strengthening. None DME required has own FWW at home. Fall and safety precautions.   2. Chronic combined systolic and diastolic CHF  Stable. Exam findings negative for shortness of breath, wheezing, rales or cough. Lower extremities edema 1+. Continue on atenolol and  Furosemide. On potassium supplement.continue on fluid restrictions and daily weight check.    3. Chronic atrial fibrillation  Currently on warfarin.HHRN to monitor INR next INR due 04/12/2017.   4. Coronary artery disease  Chest pain free. Continue on ASA and Atenolol   5. Neuropathy Continue on Gabapentin.   6. Venous ulcer of left leg  Afebrile.Left leg ulna boot in place venous ulcer not visualized but per wound Nurse ulcer stable wound bed pink without any drainage.continue wound care. HHRN for wound management.   Patient is being discharged with the following home health services:   -PT/OT for ROM, exercise, gait stability and muscle strengthening  -  HH RN for wound care management   Patient is being discharged with the following durable medical equipment:   - None DME required has own FWW at home.  Patient has been advised to f/u with their PCP in 1-2 weeks to for a transitions of care visit.Social services at their facility was responsible for arranging this appointment.  Pt was provided with adequate prescriptions of noncontrolled medications to reach the scheduled appointment.For controlled substances, a limited supply was provided as appropriate for the individual patient. If the pt normally receives these medications from a pain clinic or has a contract with another physician, these medications should be received from that clinic or physician only).    Future labs/tests needed:  CBC, BMP in 1-2 weeks PCP.  INR due 04/12/2017 with HHRN.

## 2017-04-10 DIAGNOSIS — Z5181 Encounter for therapeutic drug level monitoring: Secondary | ICD-10-CM | POA: Diagnosis not present

## 2017-04-10 DIAGNOSIS — Z7982 Long term (current) use of aspirin: Secondary | ICD-10-CM | POA: Diagnosis not present

## 2017-04-10 DIAGNOSIS — M6281 Muscle weakness (generalized): Secondary | ICD-10-CM | POA: Diagnosis not present

## 2017-04-10 DIAGNOSIS — I4891 Unspecified atrial fibrillation: Secondary | ICD-10-CM | POA: Diagnosis not present

## 2017-04-10 DIAGNOSIS — I5040 Unspecified combined systolic (congestive) and diastolic (congestive) heart failure: Secondary | ICD-10-CM | POA: Diagnosis not present

## 2017-04-10 DIAGNOSIS — I83023 Varicose veins of left lower extremity with ulcer of ankle: Secondary | ICD-10-CM | POA: Diagnosis not present

## 2017-04-10 DIAGNOSIS — L97329 Non-pressure chronic ulcer of left ankle with unspecified severity: Secondary | ICD-10-CM | POA: Diagnosis not present

## 2017-04-10 DIAGNOSIS — Z7901 Long term (current) use of anticoagulants: Secondary | ICD-10-CM | POA: Diagnosis not present

## 2017-04-29 DIAGNOSIS — I8312 Varicose veins of left lower extremity with inflammation: Secondary | ICD-10-CM | POA: Diagnosis not present

## 2017-04-29 DIAGNOSIS — I8311 Varicose veins of right lower extremity with inflammation: Secondary | ICD-10-CM | POA: Diagnosis not present

## 2017-04-29 DIAGNOSIS — L97321 Non-pressure chronic ulcer of left ankle limited to breakdown of skin: Secondary | ICD-10-CM | POA: Diagnosis not present

## 2017-04-29 DIAGNOSIS — I83023 Varicose veins of left lower extremity with ulcer of ankle: Secondary | ICD-10-CM | POA: Diagnosis not present

## 2017-05-20 DIAGNOSIS — I87303 Chronic venous hypertension (idiopathic) without complications of bilateral lower extremity: Secondary | ICD-10-CM | POA: Diagnosis not present

## 2017-05-20 DIAGNOSIS — I83029 Varicose veins of left lower extremity with ulcer of unspecified site: Secondary | ICD-10-CM | POA: Diagnosis not present

## 2017-05-20 DIAGNOSIS — L97929 Non-pressure chronic ulcer of unspecified part of left lower leg with unspecified severity: Secondary | ICD-10-CM | POA: Diagnosis not present

## 2017-06-03 DIAGNOSIS — I83003 Varicose veins of unspecified lower extremity with ulcer of ankle: Secondary | ICD-10-CM | POA: Diagnosis not present

## 2017-06-03 DIAGNOSIS — I5042 Chronic combined systolic (congestive) and diastolic (congestive) heart failure: Secondary | ICD-10-CM | POA: Diagnosis not present

## 2017-06-03 DIAGNOSIS — I10 Essential (primary) hypertension: Secondary | ICD-10-CM | POA: Diagnosis not present

## 2017-06-03 DIAGNOSIS — C8590 Non-Hodgkin lymphoma, unspecified, unspecified site: Secondary | ICD-10-CM | POA: Diagnosis not present

## 2017-06-09 DIAGNOSIS — Z7982 Long term (current) use of aspirin: Secondary | ICD-10-CM | POA: Diagnosis not present

## 2017-06-09 DIAGNOSIS — L97221 Non-pressure chronic ulcer of left calf limited to breakdown of skin: Secondary | ICD-10-CM | POA: Diagnosis not present

## 2017-06-09 DIAGNOSIS — I5040 Unspecified combined systolic (congestive) and diastolic (congestive) heart failure: Secondary | ICD-10-CM | POA: Diagnosis not present

## 2017-06-09 DIAGNOSIS — Z5181 Encounter for therapeutic drug level monitoring: Secondary | ICD-10-CM | POA: Diagnosis not present

## 2017-06-09 DIAGNOSIS — L97301 Non-pressure chronic ulcer of unspecified ankle limited to breakdown of skin: Secondary | ICD-10-CM | POA: Diagnosis not present

## 2017-06-09 DIAGNOSIS — I83023 Varicose veins of left lower extremity with ulcer of ankle: Secondary | ICD-10-CM | POA: Diagnosis not present

## 2017-06-09 DIAGNOSIS — Z7901 Long term (current) use of anticoagulants: Secondary | ICD-10-CM | POA: Diagnosis not present

## 2017-06-09 DIAGNOSIS — I4891 Unspecified atrial fibrillation: Secondary | ICD-10-CM | POA: Diagnosis not present

## 2017-06-10 DIAGNOSIS — I739 Peripheral vascular disease, unspecified: Secondary | ICD-10-CM | POA: Diagnosis not present

## 2017-06-10 DIAGNOSIS — I83223 Varicose veins of left lower extremity with both ulcer of ankle and inflammation: Secondary | ICD-10-CM | POA: Diagnosis not present

## 2017-06-10 DIAGNOSIS — L97821 Non-pressure chronic ulcer of other part of left lower leg limited to breakdown of skin: Secondary | ICD-10-CM | POA: Diagnosis not present

## 2017-06-10 DIAGNOSIS — I872 Venous insufficiency (chronic) (peripheral): Secondary | ICD-10-CM | POA: Diagnosis not present

## 2017-06-24 DIAGNOSIS — L821 Other seborrheic keratosis: Secondary | ICD-10-CM | POA: Diagnosis not present

## 2017-06-24 DIAGNOSIS — Z85828 Personal history of other malignant neoplasm of skin: Secondary | ICD-10-CM | POA: Diagnosis not present

## 2017-06-24 DIAGNOSIS — D1801 Hemangioma of skin and subcutaneous tissue: Secondary | ICD-10-CM | POA: Diagnosis not present

## 2017-06-24 DIAGNOSIS — C44519 Basal cell carcinoma of skin of other part of trunk: Secondary | ICD-10-CM | POA: Diagnosis not present

## 2017-06-24 DIAGNOSIS — L570 Actinic keratosis: Secondary | ICD-10-CM | POA: Diagnosis not present

## 2017-07-08 DIAGNOSIS — I739 Peripheral vascular disease, unspecified: Secondary | ICD-10-CM | POA: Diagnosis not present

## 2017-07-08 DIAGNOSIS — L97822 Non-pressure chronic ulcer of other part of left lower leg with fat layer exposed: Secondary | ICD-10-CM | POA: Diagnosis not present

## 2017-07-08 DIAGNOSIS — I872 Venous insufficiency (chronic) (peripheral): Secondary | ICD-10-CM | POA: Diagnosis not present

## 2017-07-08 DIAGNOSIS — I83228 Varicose veins of left lower extremity with both ulcer of other part of lower extremity and inflammation: Secondary | ICD-10-CM | POA: Diagnosis not present

## 2017-08-05 DIAGNOSIS — I83029 Varicose veins of left lower extremity with ulcer of unspecified site: Secondary | ICD-10-CM | POA: Diagnosis not present

## 2017-08-05 DIAGNOSIS — I83229 Varicose veins of left lower extremity with both ulcer of unspecified site and inflammation: Secondary | ICD-10-CM | POA: Diagnosis not present

## 2017-08-05 DIAGNOSIS — L97929 Non-pressure chronic ulcer of unspecified part of left lower leg with unspecified severity: Secondary | ICD-10-CM | POA: Diagnosis not present

## 2017-08-05 DIAGNOSIS — I739 Peripheral vascular disease, unspecified: Secondary | ICD-10-CM | POA: Diagnosis not present

## 2017-08-05 DIAGNOSIS — L97821 Non-pressure chronic ulcer of other part of left lower leg limited to breakdown of skin: Secondary | ICD-10-CM | POA: Diagnosis not present

## 2017-08-05 DIAGNOSIS — I8311 Varicose veins of right lower extremity with inflammation: Secondary | ICD-10-CM | POA: Diagnosis not present

## 2017-08-05 DIAGNOSIS — I87303 Chronic venous hypertension (idiopathic) without complications of bilateral lower extremity: Secondary | ICD-10-CM | POA: Diagnosis not present

## 2017-08-05 DIAGNOSIS — I83228 Varicose veins of left lower extremity with both ulcer of other part of lower extremity and inflammation: Secondary | ICD-10-CM | POA: Diagnosis not present

## 2017-08-09 DIAGNOSIS — Z5181 Encounter for therapeutic drug level monitoring: Secondary | ICD-10-CM | POA: Diagnosis not present

## 2017-08-09 DIAGNOSIS — I4891 Unspecified atrial fibrillation: Secondary | ICD-10-CM | POA: Diagnosis not present

## 2017-08-09 DIAGNOSIS — I5040 Unspecified combined systolic (congestive) and diastolic (congestive) heart failure: Secondary | ICD-10-CM | POA: Diagnosis not present

## 2017-08-09 DIAGNOSIS — Z7901 Long term (current) use of anticoagulants: Secondary | ICD-10-CM | POA: Diagnosis not present

## 2017-08-09 DIAGNOSIS — Z7982 Long term (current) use of aspirin: Secondary | ICD-10-CM | POA: Diagnosis not present

## 2017-08-09 DIAGNOSIS — L97301 Non-pressure chronic ulcer of unspecified ankle limited to breakdown of skin: Secondary | ICD-10-CM | POA: Diagnosis not present

## 2017-08-09 DIAGNOSIS — I83023 Varicose veins of left lower extremity with ulcer of ankle: Secondary | ICD-10-CM | POA: Diagnosis not present

## 2017-08-09 DIAGNOSIS — L89892 Pressure ulcer of other site, stage 2: Secondary | ICD-10-CM | POA: Diagnosis not present

## 2017-08-15 ENCOUNTER — Emergency Department (HOSPITAL_COMMUNITY): Payer: Medicare Other

## 2017-08-15 ENCOUNTER — Observation Stay (HOSPITAL_COMMUNITY)
Admission: EM | Admit: 2017-08-15 | Discharge: 2017-08-18 | Disposition: A | Discharge status: Home / Self Care | Payer: Medicare Other | Attending: Internal Medicine | Admitting: Internal Medicine

## 2017-08-15 ENCOUNTER — Encounter (HOSPITAL_COMMUNITY): Payer: Self-pay | Admitting: Emergency Medicine

## 2017-08-15 DIAGNOSIS — M6281 Muscle weakness (generalized): Secondary | ICD-10-CM | POA: Insufficient documentation

## 2017-08-15 DIAGNOSIS — R531 Weakness: Secondary | ICD-10-CM | POA: Diagnosis not present

## 2017-08-15 DIAGNOSIS — Z836 Family history of other diseases of the respiratory system: Secondary | ICD-10-CM | POA: Insufficient documentation

## 2017-08-15 DIAGNOSIS — L97829 Non-pressure chronic ulcer of other part of left lower leg with unspecified severity: Secondary | ICD-10-CM

## 2017-08-15 DIAGNOSIS — R262 Difficulty in walking, not elsewhere classified: Secondary | ICD-10-CM | POA: Insufficient documentation

## 2017-08-15 DIAGNOSIS — Z9861 Coronary angioplasty status: Secondary | ICD-10-CM | POA: Insufficient documentation

## 2017-08-15 DIAGNOSIS — Z8489 Family history of other specified conditions: Secondary | ICD-10-CM | POA: Insufficient documentation

## 2017-08-15 DIAGNOSIS — G4700 Insomnia, unspecified: Secondary | ICD-10-CM | POA: Insufficient documentation

## 2017-08-15 DIAGNOSIS — M109 Gout, unspecified: Secondary | ICD-10-CM | POA: Insufficient documentation

## 2017-08-15 DIAGNOSIS — I110 Hypertensive heart disease with heart failure: Secondary | ICD-10-CM | POA: Diagnosis not present

## 2017-08-15 DIAGNOSIS — Z8572 Personal history of non-Hodgkin lymphomas: Secondary | ICD-10-CM | POA: Diagnosis not present

## 2017-08-15 DIAGNOSIS — L03115 Cellulitis of right lower limb: Secondary | ICD-10-CM | POA: Insufficient documentation

## 2017-08-15 DIAGNOSIS — I872 Venous insufficiency (chronic) (peripheral): Secondary | ICD-10-CM | POA: Diagnosis not present

## 2017-08-15 DIAGNOSIS — Z86711 Personal history of pulmonary embolism: Secondary | ICD-10-CM | POA: Insufficient documentation

## 2017-08-15 DIAGNOSIS — Z833 Family history of diabetes mellitus: Secondary | ICD-10-CM | POA: Insufficient documentation

## 2017-08-15 DIAGNOSIS — I739 Peripheral vascular disease, unspecified: Secondary | ICD-10-CM | POA: Insufficient documentation

## 2017-08-15 DIAGNOSIS — Z951 Presence of aortocoronary bypass graft: Secondary | ICD-10-CM | POA: Insufficient documentation

## 2017-08-15 DIAGNOSIS — I2510 Atherosclerotic heart disease of native coronary artery without angina pectoris: Secondary | ICD-10-CM | POA: Insufficient documentation

## 2017-08-15 DIAGNOSIS — I83009 Varicose veins of unspecified lower extremity with ulcer of unspecified site: Secondary | ICD-10-CM | POA: Diagnosis not present

## 2017-08-15 DIAGNOSIS — G8929 Other chronic pain: Secondary | ICD-10-CM | POA: Diagnosis not present

## 2017-08-15 DIAGNOSIS — Z885 Allergy status to narcotic agent status: Secondary | ICD-10-CM | POA: Insufficient documentation

## 2017-08-15 DIAGNOSIS — L97919 Non-pressure chronic ulcer of unspecified part of right lower leg with unspecified severity: Secondary | ICD-10-CM | POA: Diagnosis not present

## 2017-08-15 DIAGNOSIS — Z23 Encounter for immunization: Secondary | ICD-10-CM | POA: Insufficient documentation

## 2017-08-15 DIAGNOSIS — Z886 Allergy status to analgesic agent status: Secondary | ICD-10-CM | POA: Insufficient documentation

## 2017-08-15 DIAGNOSIS — I491 Atrial premature depolarization: Secondary | ICD-10-CM | POA: Diagnosis not present

## 2017-08-15 DIAGNOSIS — I482 Chronic atrial fibrillation, unspecified: Secondary | ICD-10-CM | POA: Diagnosis present

## 2017-08-15 DIAGNOSIS — J9600 Acute respiratory failure, unspecified whether with hypoxia or hypercapnia: Secondary | ICD-10-CM

## 2017-08-15 DIAGNOSIS — M1990 Unspecified osteoarthritis, unspecified site: Secondary | ICD-10-CM | POA: Insufficient documentation

## 2017-08-15 DIAGNOSIS — K5900 Constipation, unspecified: Secondary | ICD-10-CM | POA: Insufficient documentation

## 2017-08-15 DIAGNOSIS — Z7982 Long term (current) use of aspirin: Secondary | ICD-10-CM | POA: Insufficient documentation

## 2017-08-15 DIAGNOSIS — Z87891 Personal history of nicotine dependence: Secondary | ICD-10-CM | POA: Insufficient documentation

## 2017-08-15 DIAGNOSIS — Z79899 Other long term (current) drug therapy: Secondary | ICD-10-CM | POA: Insufficient documentation

## 2017-08-15 DIAGNOSIS — I509 Heart failure, unspecified: Secondary | ICD-10-CM

## 2017-08-15 DIAGNOSIS — I252 Old myocardial infarction: Secondary | ICD-10-CM | POA: Diagnosis not present

## 2017-08-15 DIAGNOSIS — I4891 Unspecified atrial fibrillation: Secondary | ICD-10-CM | POA: Diagnosis present

## 2017-08-15 DIAGNOSIS — Z9842 Cataract extraction status, left eye: Secondary | ICD-10-CM | POA: Insufficient documentation

## 2017-08-15 DIAGNOSIS — J9801 Acute bronchospasm: Secondary | ICD-10-CM

## 2017-08-15 DIAGNOSIS — G629 Polyneuropathy, unspecified: Secondary | ICD-10-CM | POA: Insufficient documentation

## 2017-08-15 DIAGNOSIS — L97909 Non-pressure chronic ulcer of unspecified part of unspecified lower leg with unspecified severity: Secondary | ICD-10-CM

## 2017-08-15 DIAGNOSIS — E785 Hyperlipidemia, unspecified: Secondary | ICD-10-CM | POA: Insufficient documentation

## 2017-08-15 DIAGNOSIS — Z87898 Personal history of other specified conditions: Secondary | ICD-10-CM | POA: Insufficient documentation

## 2017-08-15 DIAGNOSIS — F419 Anxiety disorder, unspecified: Secondary | ICD-10-CM | POA: Diagnosis not present

## 2017-08-15 DIAGNOSIS — K219 Gastro-esophageal reflux disease without esophagitis: Secondary | ICD-10-CM | POA: Insufficient documentation

## 2017-08-15 DIAGNOSIS — L03116 Cellulitis of left lower limb: Secondary | ICD-10-CM | POA: Insufficient documentation

## 2017-08-15 DIAGNOSIS — L97929 Non-pressure chronic ulcer of unspecified part of left lower leg with unspecified severity: Secondary | ICD-10-CM | POA: Diagnosis not present

## 2017-08-15 DIAGNOSIS — I495 Sick sinus syndrome: Secondary | ICD-10-CM | POA: Insufficient documentation

## 2017-08-15 DIAGNOSIS — R05 Cough: Secondary | ICD-10-CM | POA: Diagnosis not present

## 2017-08-15 DIAGNOSIS — Z882 Allergy status to sulfonamides status: Secondary | ICD-10-CM | POA: Insufficient documentation

## 2017-08-15 DIAGNOSIS — R2681 Unsteadiness on feet: Secondary | ICD-10-CM | POA: Insufficient documentation

## 2017-08-15 DIAGNOSIS — I5033 Acute on chronic diastolic (congestive) heart failure: Secondary | ICD-10-CM | POA: Diagnosis not present

## 2017-08-15 DIAGNOSIS — I83028 Varicose veins of left lower extremity with ulcer other part of lower leg: Secondary | ICD-10-CM

## 2017-08-15 DIAGNOSIS — Z95810 Presence of automatic (implantable) cardiac defibrillator: Secondary | ICD-10-CM | POA: Insufficient documentation

## 2017-08-15 DIAGNOSIS — R0600 Dyspnea, unspecified: Secondary | ICD-10-CM | POA: Diagnosis present

## 2017-08-15 DIAGNOSIS — Z9841 Cataract extraction status, right eye: Secondary | ICD-10-CM | POA: Insufficient documentation

## 2017-08-15 DIAGNOSIS — I340 Nonrheumatic mitral (valve) insufficiency: Secondary | ICD-10-CM | POA: Insufficient documentation

## 2017-08-15 DIAGNOSIS — Z7901 Long term (current) use of anticoagulants: Secondary | ICD-10-CM | POA: Insufficient documentation

## 2017-08-15 DIAGNOSIS — R0602 Shortness of breath: Secondary | ICD-10-CM | POA: Diagnosis not present

## 2017-08-15 DIAGNOSIS — R2689 Other abnormalities of gait and mobility: Secondary | ICD-10-CM | POA: Insufficient documentation

## 2017-08-15 DIAGNOSIS — Z85828 Personal history of other malignant neoplasm of skin: Secondary | ICD-10-CM | POA: Insufficient documentation

## 2017-08-15 LAB — COMPREHENSIVE METABOLIC PANEL
ALT: 14 U/L — ABNORMAL LOW (ref 17–63)
ANION GAP: 9 (ref 5–15)
AST: 24 U/L (ref 15–41)
Albumin: 3.1 g/dL — ABNORMAL LOW (ref 3.5–5.0)
Alkaline Phosphatase: 69 U/L (ref 38–126)
BILIRUBIN TOTAL: 0.8 mg/dL (ref 0.3–1.2)
BUN: 19 mg/dL (ref 6–20)
CO2: 27 mmol/L (ref 22–32)
Calcium: 9.8 mg/dL (ref 8.9–10.3)
Chloride: 102 mmol/L (ref 101–111)
Creatinine, Ser: 1 mg/dL (ref 0.61–1.24)
GFR calc Af Amer: >60 mL/min (ref >60)
Glucose, Bld: 103 mg/dL — ABNORMAL HIGH (ref 65–99)
POTASSIUM: 4 mmol/L (ref 3.5–5.1)
Sodium: 138 mmol/L (ref 135–145)
TOTAL PROTEIN: 7.8 g/dL (ref 6.5–8.1)

## 2017-08-15 LAB — URINALYSIS, ROUTINE W REFLEX MICROSCOPIC
BILIRUBIN URINE: NEGATIVE
GLUCOSE, UA: NEGATIVE mg/dL
HGB URINE DIPSTICK: NEGATIVE
KETONES UR: NEGATIVE mg/dL
Leukocytes, UA: NEGATIVE
Nitrite: NEGATIVE
PROTEIN: NEGATIVE mg/dL
Specific Gravity, Urine: 1.013 (ref 1.005–1.030)
pH: 6 (ref 5.0–8.0)

## 2017-08-15 LAB — CBC WITH DIFFERENTIAL/PLATELET
Basophils Absolute: 0 10*3/uL (ref 0.0–0.1)
Basophils Relative: 0 %
Eosinophils Absolute: 0.3 10*3/uL (ref 0.0–0.7)
Eosinophils Relative: 3 %
HEMATOCRIT: 40.6 % (ref 39.0–52.0)
Hemoglobin: 13.1 g/dL (ref 13.0–17.0)
LYMPHS ABS: 2.7 10*3/uL (ref 0.7–4.0)
Lymphocytes Relative: 27 %
MCH: 32.9 pg (ref 26.0–34.0)
MCHC: 32.3 g/dL (ref 30.0–36.0)
MCV: 102 fL — AB (ref 78.0–100.0)
MONO ABS: 1 10*3/uL (ref 0.1–1.0)
MONOS PCT: 10 %
NEUTROS ABS: 6.1 10*3/uL (ref 1.7–7.7)
Neutrophils Relative %: 60 %
Platelets: 174 10*3/uL (ref 150–400)
RBC: 3.98 MIL/uL — ABNORMAL LOW (ref 4.22–5.81)
RDW: 14.8 % (ref 11.5–15.5)
WBC: 10.1 10*3/uL (ref 4.0–10.5)

## 2017-08-15 LAB — TROPONIN I
Troponin I: <0.03 ng/mL (ref <0.03)
Troponin I: <0.03 ng/mL (ref <0.03)

## 2017-08-15 LAB — PROTIME-INR
INR: 1.7
Prothrombin Time: 19.8 seconds — ABNORMAL HIGH (ref 11.4–15.2)

## 2017-08-15 LAB — BRAIN NATRIURETIC PEPTIDE: B Natriuretic Peptide: 272.4 pg/mL — ABNORMAL HIGH (ref 0.0–100.0)

## 2017-08-15 LAB — MAGNESIUM: Magnesium: 1.8 mg/dL (ref 1.7–2.4)

## 2017-08-15 MED ORDER — SENNOSIDES-DOCUSATE SODIUM 8.6-50 MG PO TABS
2.0000 | ORAL_TABLET | Freq: Every day | ORAL | Status: DC
  Administered 2017-08-15 – 2017-08-17 (×2): 2 via ORAL
  Filled 2017-08-15 (×3): qty 2

## 2017-08-15 MED ORDER — INFLUENZA VAC SPLIT HIGH-DOSE 0.5 ML IM SUSY
0.5000 mL | PREFILLED_SYRINGE | INTRAMUSCULAR | Status: AC
  Administered 2017-08-16: 0.5 mL via INTRAMUSCULAR
  Filled 2017-08-15: qty 0.5

## 2017-08-15 MED ORDER — WARFARIN SODIUM 2 MG PO TABS
4.0000 mg | ORAL_TABLET | Freq: Once | ORAL | Status: AC
  Administered 2017-08-15: 4 mg via ORAL
  Filled 2017-08-15: qty 2

## 2017-08-15 MED ORDER — NITROGLYCERIN 0.4 MG SL SUBL: 0.4000 mg | SUBLINGUAL_TABLET | SUBLINGUAL | Status: DC | PRN

## 2017-08-15 MED ORDER — SODIUM CHLORIDE 0.9% FLUSH: 3.0000 mL | INTRAVENOUS | Status: DC | PRN

## 2017-08-15 MED ORDER — HYDRALAZINE HCL 20 MG/ML IJ SOLN
10.0000 mg | Freq: Three times a day (TID) | INTRAMUSCULAR | Status: DC | PRN
Start: 2017-08-15 — End: 2017-08-18

## 2017-08-15 MED ORDER — POTASSIUM CHLORIDE CRYS ER 20 MEQ PO TBCR
40.0000 meq | EXTENDED_RELEASE_TABLET | Freq: Two times a day (BID) | ORAL | Status: DC
  Administered 2017-08-15 – 2017-08-18 (×6): 40 meq via ORAL
  Filled 2017-08-15 (×6): qty 2

## 2017-08-15 MED ORDER — WARFARIN - PHARMACIST DOSING INPATIENT: Freq: Every day | Status: DC

## 2017-08-15 MED ORDER — IPRATROPIUM-ALBUTEROL 0.5-2.5 (3) MG/3ML IN SOLN
3.0000 mL | Freq: Once | RESPIRATORY_TRACT | Status: AC
  Administered 2017-08-15: 3 mL via RESPIRATORY_TRACT
  Filled 2017-08-15: qty 3

## 2017-08-15 MED ORDER — ASPIRIN EC 81 MG PO TBEC
81.0000 mg | DELAYED_RELEASE_TABLET | ORAL | Status: DC
  Administered 2017-08-16 – 2017-08-18 (×2): 81 mg via ORAL
  Filled 2017-08-15 (×3): qty 1

## 2017-08-15 MED ORDER — POLYETHYLENE GLYCOL 3350 17 G PO PACK: 17.0000 g | PACK | Freq: Every day | ORAL | Status: DC | PRN

## 2017-08-15 MED ORDER — ACETAMINOPHEN 325 MG PO TABS
650.0000 mg | ORAL_TABLET | ORAL | Status: DC | PRN
  Administered 2017-08-17 (×2): 650 mg via ORAL
  Filled 2017-08-15 (×2): qty 2

## 2017-08-15 MED ORDER — ONDANSETRON HCL 4 MG/2ML IJ SOLN: 4.0000 mg | Freq: Four times a day (QID) | INTRAMUSCULAR | Status: DC | PRN

## 2017-08-15 MED ORDER — ALLOPURINOL 300 MG PO TABS
300.0000 mg | ORAL_TABLET | Freq: Every day | ORAL | Status: DC
  Administered 2017-08-16 – 2017-08-18 (×3): 300 mg via ORAL
  Filled 2017-08-15 (×3): qty 1

## 2017-08-15 MED ORDER — METOLAZONE 2.5 MG PO TABS
2.5000 mg | ORAL_TABLET | Freq: Every day | ORAL | Status: DC | PRN
  Filled 2017-08-15: qty 1

## 2017-08-15 MED ORDER — SODIUM CHLORIDE 0.9% FLUSH
3.0000 mL | Freq: Two times a day (BID) | INTRAVENOUS | Status: DC
  Administered 2017-08-15 – 2017-08-18 (×7): 3 mL via INTRAVENOUS

## 2017-08-15 MED ORDER — ALPRAZOLAM 0.5 MG PO TABS
0.5000 mg | ORAL_TABLET | Freq: Every evening | ORAL | Status: DC | PRN
Start: 2017-08-15 — End: 2017-08-18
  Administered 2017-08-15 – 2017-08-17 (×3): 0.5 mg via ORAL
  Filled 2017-08-15 (×3): qty 1

## 2017-08-15 MED ORDER — SODIUM CHLORIDE 0.9 % IV SOLN: 250.0000 mL | INTRAVENOUS | Status: DC | PRN

## 2017-08-15 MED ORDER — FUROSEMIDE 10 MG/ML IJ SOLN
120.0000 mg | Freq: Once | INTRAVENOUS | Status: AC
  Administered 2017-08-15: 120 mg via INTRAVENOUS
  Filled 2017-08-15 (×2): qty 12

## 2017-08-15 MED ORDER — ATENOLOL 50 MG PO TABS
50.0000 mg | ORAL_TABLET | Freq: Every day | ORAL | Status: DC
  Administered 2017-08-16 – 2017-08-18 (×3): 50 mg via ORAL
  Filled 2017-08-15 (×3): qty 1

## 2017-08-15 NOTE — ED Triage Notes (Addendum)
Patient arrived to ED from home via [facility]. EMS reports:  Patient lives alone. Called EMS for [facility] x 2 days. Does not use home oxygen. 96% on room air. Bilateral wheezes noted.  Hx - CHF, Triple bypass 1990.  BP 135/70, Pulse 72, Resp 16, 96% on room air. 20 gauge in L FA.  Unna boot noted on LLE.

## 2017-08-15 NOTE — Progress Notes (Signed)
ANTICOAGULATION CONSULT NOTE - Initial Consult  Pharmacy Consult:  Coumadin Indication: atrial fibrillation  Allergies  Allergen Reactions  . Avelox [Moxifloxacin Hcl In Nacl] Other (See Comments)    Messed up lungs  . Indomethacin Other (See Comments)    Renal insufficiency  . Levaquin [Levofloxacin] Other (See Comments)    Messed up lungs  . Lipitor [Atorvastatin] Nausea Only, Palpitations and Other (See Comments)    Irregular heart beat also  . Moxifloxacin Other (See Comments)    Respiratory distress  . Pravachol [Pravastatin Sodium] Nausea Only, Palpitations and Other (See Comments)  . Aleve [Naproxen Sodium] Nausea And Vomiting and Other (See Comments)    GI Upset  . Norpace [Disopyramide Phosphate] Other (See Comments)    Urinary retention  . Disopyramide Other (See Comments)    Urinary retention  . Morphine Nausea Only    Irregular hear beat  . Pravastatin Nausea Only    Irregular heart beat  . Amlodipine Besylate Rash  . Azithromycin Rash  . Bactrim [Sulfamethoxazole-Trimethoprim] Rash  . Clinoril [Sulindac] Rash  . Codeine Other (See Comments)    unknown  . Digoxin Rash  . Lorazepam Other (See Comments)    unknown  . Morphine And Related Other (See Comments)    unknown  . Other Other (See Comments)    Tripilenomenon? Per paperwork  . Oxycodone-Acetaminophen Rash  . Percocet [Oxycodone-Acetaminophen] Rash  . Sulfamethoxazole-Trimethoprim Rash  . Sulfonamide Derivatives Rash  . Uloric [Febuxostat] Other (See Comments)    unknown    Patient Measurements: Height: 5\' 3"  (160 cm) Weight: 203 lb 4.2 oz (92.2 kg) IBW/kg (Calculated) : 56.9  Vital Signs: Temp: 98.9 F (37.2 C) (10/07 1338) Temp Source: Oral (10/07 1338) BP: 141/72 (10/07 1338) Pulse Rate: 68 (10/07 1338)  Labs:  Recent Labs  08/15/17 0917 08/15/17 1332  HGB 13.1  --   HCT 40.6  --   PLT 174  --   LABPROT  --  19.8*  INR  --  1.70  CREATININE 1.00  --   TROPONINI <0.03  --      Estimated Creatinine Clearance: 47.3 mL/min (by C-G formula based on SCr of 1 mg/dL).   Medical History: Past Medical History:  Diagnosis Date  . Arrhythmia    afib  . Arthritis    "back and hands" (09/26/2015)  . Basal cell carcinoma   . CHF (congestive heart failure) ([place])   . Coronary artery disease   . CVI (common variable immunodeficiency) ([place])   . DJD (degenerative joint disease)   . ED (erectile dysfunction)   . GERD (gastroesophageal reflux disease)   . Gout   . Heart attack ([place]) 1986  . Hyperlipidemia   . Hypertension   . IFG (impaired fasting glucose)   . Neuropathy   . Non Hodgkin's lymphoma ([place]) dx'd 2006  . PAC (premature atrial contraction)   . PE (pulmonary embolism) 2007   "when I was taking chemo"  . Peptic ulcer disease   . Pneumonia ~ 2007 X 2  . Squamous carcinoma   . Venous stasis ulcers (HCC)       Assessment: 6 YOM with history of Afib to continue on Coumadin from PTA.  Patient's INR is sub-therapeutic on admit at 1.7.  CBC stable; no bleeding reported.  Home Coumadin dose: 2.5mg  PO daily   Goal of Therapy:  INR 2-3 Monitor platelets by anticoagulation protocol: Yes    Plan:  Coumadin 4mg  PO today Daily PT / INR  Kristoffer Bala D. Mina Marble, PharmD, BCPS Pager:  [phone] 08/15/2017, 2:17 PM

## 2017-08-15 NOTE — H&P (Addendum)
[facility] History and Physical  GERIK COBERLY ZYS:[id number] DOB: 04-22-1923 DOA: 08/15/2017  Referring physician:  PCP: Lavone Orn, MD   Chief Complaint: "I've been coughing."  HPI: [name] is a 83 y.o. male  with past medical history significant for atrial fibrillation, CHF, coronary disease, gout, myocardial infarction, non-Hodgkin's lymphoma presents to the hospital with chief complaint of dyspnea and weakness. Patient denies any sick contacts or recent medication changes. Has been dyspneic with a dry cough for about the last week. Over last 24 hours patient's cough became worse. Patient had trouble ambulating and standing. Patient's son brought patient to the hospital for evaluation.  ED course: X-ray done that shows basilar congestion. Patient given a DuoNeb. Some relief with nebulizer treatment and cough became productive. On standing patient was very unsteady animals toppled over. Hospitalist consulted for admission.   Review of Systems:  As per HPI otherwise 10 point review of systems negative.    Past Medical History:  Diagnosis Date  . Arrhythmia    afib  . Arthritis    "back and hands" (09/26/2015)  . Basal cell carcinoma   . CHF (congestive heart failure) ([place])   . Coronary artery disease   . CVI (common variable immunodeficiency) ([place])   . DJD (degenerative joint disease)   . ED (erectile dysfunction)   . GERD (gastroesophageal reflux disease)   . Gout   . Heart attack ([place]) 1986  . Hyperlipidemia   . Hypertension   . IFG (impaired fasting glucose)   . Neuropathy   . Non Hodgkin's lymphoma ([place]) dx'd 2006  . PAC (premature atrial contraction)   . PE (pulmonary embolism) 2007   "when I was taking chemo"  . Peptic ulcer disease   . Pneumonia ~ 2007 X 2  . Squamous carcinoma   . Venous stasis ulcers (HCC)    Past Surgical History:  Procedure Laterality Date  . BACK SURGERY    . CARDIAC CATHETERIZATION  1990  . CATARACT EXTRACTION W/  INTRAOCULAR LENS  IMPLANT, BILATERAL Bilateral 10/2006 ~ 11/2006  . CORONARY ANGIOPLASTY    . CORONARY ARTERY BYPASS GRAFT  1990   CABG X3  . ELBOW SURGERY Left    "related to gout"  . INSERT / REPLACE / REMOVE PACEMAKER  08/2010  . KNEE ARTHROSCOPY Left   . LUMBAR DISC SURGERY     "had pinched nerve; had to make room for it"  . NASAL SINUS SURGERY  1980   "related to bacteria infection in sinus"  . SQUAMOUS CELL CARCINOMA EXCISION Right 09/12/2015   cheek  . TONSILLECTOMY  1972   Social History:  reports that he has quit smoking. His smoking use included Cigarettes. He has a 1.00 pack-year smoking history. He has never used smokeless tobacco. He reports that he does not drink alcohol or use drugs.  Allergies  Allergen Reactions  . Avelox [Moxifloxacin Hcl In Nacl] Other (See Comments)    Messed up lungs  . Indomethacin Other (See Comments)    Renal insufficiency  . Levaquin [Levofloxacin] Other (See Comments)    Messed up lungs  . Lipitor [Atorvastatin] Nausea Only, Palpitations and Other (See Comments)    Irregular heart beat also  . Moxifloxacin Other (See Comments)    Respiratory distress  . Pravachol [Pravastatin Sodium] Nausea Only, Palpitations and Other (See Comments)  . Aleve [Naproxen Sodium] Nausea And Vomiting and Other (See Comments)    GI Upset  . Norpace [Disopyramide Phosphate] Other (See Comments)  Urinary retention  . Disopyramide Other (See Comments)    Urinary retention  . Morphine Nausea Only    Irregular hear beat  . Pravastatin Nausea Only    Irregular heart beat  . Amlodipine Besylate Rash  . Azithromycin Rash  . Bactrim [Sulfamethoxazole-Trimethoprim] Rash  . Clinoril [Sulindac] Rash  . Codeine Other (See Comments)    unknown  . Digoxin Rash  . Lorazepam Other (See Comments)    unknown  . Morphine And Related Other (See Comments)    unknown  . Other Other (See Comments)    Tripilenomenon? Per paperwork  . Oxycodone-Acetaminophen Rash  .  Percocet [Oxycodone-Acetaminophen] Rash  . Sulfamethoxazole-Trimethoprim Rash  . Sulfonamide Derivatives Rash  . Uloric [Febuxostat] Other (See Comments)    unknown    Family History  Problem Relation Age of Onset  . Obesity Mother   . Lung disease Father   . Diabetes type II Sister   . Heart disease Brother   . Diabetes type II Daughter      Prior to Admission medications   Medication Sig Start Date End Date Taking? Authorizing Provider  acetaminophen (TYLENOL) 500 MG tablet Take 500 mg by mouth at bedtime.    Yes [provider]  allopurinol (ZYLOPRIM) 300 MG tablet Take 300 mg by mouth daily.    Yes [provider]  ALPRAZolam Duanne Moron) 0.5 MG tablet Take 1 tablet (0.5 mg total) by mouth at bedtime as needed for anxiety. For sleep Patient taking differently: Take 0.5 mg by mouth at bedtime. For sleep 03/23/17  Yes Geradine Girt, DO  aspirin 81 MG tablet Take 81 mg by mouth every other day.    Yes [provider]  atenolol (TENORMIN) 50 MG tablet Take 50 mg by mouth daily.    Yes [provider]  Cholecalciferol 2000 UNITS CAPS Take 4,000 Units by mouth daily.   Yes [provider]  clotrimazole-betamethasone (LOTRISONE) cream Apply 1 application topically as directed. 08/11/17  Yes [provider]  colchicine 0.6 MG tablet Take 0.6 mg by mouth daily as needed (gout).    Yes [provider]  furosemide (LASIX) 80 MG tablet Take 80 mg by mouth daily. Or as directed   Yes [provider]  gabapentin (NEURONTIN) 100 MG capsule Take 100 mg by mouth 3 (three) times daily. Takes at 0900,1700,1900 daily   Yes [provider]  metolazone (ZAROXOLYN) 2.5 MG tablet Take 2.5 mg by mouth daily as needed (for pain).   Yes [provider]  nitroGLYCERIN (NITROSTAT) 0.4 MG SL tablet Place 0.4 mg under the tongue every 5 (five) minutes as needed for chest pain.   Yes [provider]  potassium chloride  SA (K-DUR,KLOR-CON) 20 MEQ tablet Take 40 mEq by mouth 2 (two) times daily.    Yes [provider]  triamcinolone cream (KENALOG) 0.1 % Apply 1 application topically daily as needed for rash. 03/10/17  Yes [provider]  warfarin (COUMADIN) 2.5 MG tablet Take 2.5 mg by mouth daily at 6 PM. Or as directed   Yes [provider]  polyethylene glycol (MIRALAX / GLYCOLAX) packet Take 17 g by mouth daily as needed for mild constipation.    [provider]  sennosides-docusate sodium (SENOKOT-S) 8.6-50 MG tablet Take 2 tablets by mouth at bedtime.    [provider]   Physical Exam: Vitals:   08/15/17 0915 08/15/17 0945 08/15/17 1015 08/15/17 1030  BP:  124/82 (!) 115/45 (!) 112/48  Pulse:  75 73 69  Resp:  (!) 21 (!) 21 (!) 23  Temp:      TempSrc:      SpO2: 95% 95% 97% 93%  Weight:      Height:        Wt Readings from Last 3 Encounters:  08/15/17 84.8 kg (187 lb)  04/07/17 87.1 kg (192 lb)  03/24/17 87.1 kg (192 lb)    General:  Appears calm and comfortable, alert and oriented 3, grossly hard of hearing Eyes:  PERRL, EOMI, normal lids, iris ENT:  grossly normal hearing, lips & tongue Neck:  no LAD, masses or thyromegaly Cardiovascular:  RR IRR, no m/r/g. No LE edema.  Respiratory:  Diffuse wheeze, lower lung field crackles bilaterally, increased work of breathing Abdomen:  soft, ntnd Skin:  Venous stasis and venous stasis ulcer on left lower extremity laterally seen on limited exam Musculoskeletal:  grossly normal tone BUE/BLE Psychiatric:  grossly normal mood and affect, speech fluent and appropriate Neurologic:  CN 2-12 grossly intact, moves all extremities in coordinated fashion.          Labs on Admission:  Basic Metabolic Panel:  Recent Labs Lab 08/15/17 0917  NA 138  K 4.0  CL 102  CO2 27  GLUCOSE 103*  BUN 19  CREATININE 1.00  CALCIUM 9.8  MG 1.8   Liver Function Tests:  Recent Labs Lab 08/15/17 0917  AST 24    ALT 14*  ALKPHOS 69  BILITOT 0.8  PROT 7.8  ALBUMIN 3.1*   No results for input(s): LIPASE, AMYLASE in the last 168 hours. No results for input(s): AMMONIA in the last 168 hours. CBC:  Recent Labs Lab 08/15/17 0917  WBC 10.1  NEUTROABS 6.1  HGB 13.1  HCT 40.6  MCV 102.0*  PLT 174   Cardiac Enzymes:  Recent Labs Lab 08/15/17 0917  TROPONINI <0.03    BNP (last 3 results)  Recent Labs  03/21/17 1631 08/15/17 0917  BNP 127.7* 272.4*    ProBNP (last 3 results) No results for input(s): PROBNP in the last 8760 hours.   Serum creatinine: 1 mg/dL 08/15/17 0917 Estimated creatinine clearance: 45.4 mL/min  CBG: No results for input(s): GLUCAP in the last 168 hours.  Radiological Exams on Admission: Dg Chest Port 1 View  Result Date: 08/15/2017 CLINICAL DATA:  Shortness of breath and cough. EXAM: PORTABLE CHEST 1 VIEW COMPARISON:  Chest x-ray dated Mar 21, 2017. FINDINGS: Left chest wall AICD in unchanged position. Stable cardiomegaly and postsurgical changes related to prior CABG. Mild pulmonary vascular congestion. Low lung volumes with bibasilar atelectasis. Unchanged rounded density in the right lower lung, most likely a nipple shadow. No focal consolidation, pleural effusion, or pneumothorax. No acute osseous abnormality. IMPRESSION: Mild pulmonary vascular congestion without overt edema. Electronically Signed   By: Titus Dubin M.D.   On: 08/15/2017 09:57    EKG: Independently reviewed. Atrial fibrillation. Paced rhythm. No STEMI.  Assessment/Plan Principal Problem:   Dyspnea Active Problems:   Atrial fibrillation (HCC)   Venous stasis ulcer (HCC)   Generalized weakness  Mild CHF exacerbation/Dyspnea Check echo, most current shows EF of 40-45% Continue metolazone IV Lasix Dry weight seems to be between 187 and 189, weight on admission 187 Nitropaste due to mild respiratory distress Continuous pulse ox Consider cardiology consult if not  improving Initial troponin negative will recheck IS  Weakness Due to patient's multiple comorbidities, living alone with limited support and frequent inpatient rehabilitation stays he may benefit  from permanent placement PT evaluation and treat  Gout Continue allopurinol Hold colchicine as acute attack  CAD Continue aspirin When necessary nitroglycerin  Atrial fibrillation Continue Tenormin Continue warfarin  Chronic pain Hold Neurontin  Constipation Continue Senokot and MiraLAX  Anxiety/insomnia When necessary Ativan  Venous stasis ulcer Wound care nurse consult  Code Status: FC  DVT Prophylaxis: warfarin Family Communication: son at bedsid Disposition Plan: Pending Improvement  Status: obs tele  Elwin Mocha, MD Family Medicine [facility] [email or web address] Password [email or web address]

## 2017-08-15 NOTE — ED Notes (Signed)
Attempted to ambulate patient, the patient is unable to stand without assistance. Patient required the help of two people in order to stand, gait is unsteady and patient can not walk without assistance.

## 2017-08-15 NOTE — ED Notes (Signed)
Unna boot removed by EDP during exam.

## 2017-08-15 NOTE — ED Provider Notes (Signed)
[facility] DEPT Provider Note   CSN: [id number] Arrival date & time: 08/15/17  2706     History   Chief Complaint Chief Complaint  Patient presents with  . Shortness of Breath    HPI [name] is a 83 y.o. male.  HPI  The pt is a 65 -year-old male, he has a known history of congestive heart failure, coronary disease, has a diagnosis of common variable immunodeficiency.  He has had a heart attack, has been on chemoi in the past (non Hodgkin's Lymphoma) and hx of afib - he has had a pacemaker placed in the past.  He no longer follows with Dr. Tamala Julian (cardiology) and Dr. Lovena Le (pacemaker) - PCP is Dr. Laurann Montana.    The patient states that over the last 3 days he has had increased amounts of shortness of breath. Also been feeling generally weak and has been having some increased difficulty ambulating with his walker back and forth to the bathroom. Ultimately he called for help this morning when his son came to visit him to check on him and found him to be short of breath wheezing and generally weak. The patient denies headache, blurred vision, chest pain and denies any significant coughing or swelling of his legs. In fact he has been getting wound care for his left lower extremity which has peripheral vascular disease and has poorly healing wounds. He denies fevers or increased pain in these areas. He does not have any history of reactive airway disease and does not take any breathing treatments. He stopped smoking many many many years ago.  Past Medical History:  Diagnosis Date  . Arrhythmia    afib  . Arthritis    "back and hands" (09/26/2015)  . Basal cell carcinoma   . CHF (congestive heart failure) ([place])   . Coronary artery disease   . CVI (common variable immunodeficiency) ([place])   . DJD (degenerative joint disease)   . ED (erectile dysfunction)   . GERD (gastroesophageal reflux disease)   . Gout   . Heart attack ([place]) 1986  . Hyperlipidemia   . Hypertension   . IFG  (impaired fasting glucose)   . Neuropathy   . Non Hodgkin's lymphoma ([place]) dx'd 2006  . PAC (premature atrial contraction)   . PE (pulmonary embolism) 2007   "when I was taking chemo"  . Peptic ulcer disease   . Pneumonia ~ 2007 X 2  . Squamous carcinoma   . Venous stasis ulcers [facility])     Patient Active Problem List   Diagnosis Date Noted  . Pressure injury of skin 03/22/2017  . Weakness 03/21/2017  . Fever in adult 09/26/2015  . Gout 09/26/2015  . Leukocytosis 09/26/2015  . Venous stasis dermatitis of both lower extremities 07/17/2015  . Bilateral lower leg cellulitis 07/09/2015  . Sepsis due to other organism, without resultant organ failure 07/07/2015  . Chronic combined systolic and diastolic CHF (congestive heart failure) ([place]) 07/07/2015  . Onychomycosis 12/04/2013  . Hx of CABG 04/25/2013  . Coronary artery disease 11/29/2011  . Neuropathy 11/29/2011  . Generalized weakness 11/29/2011  . Atrial fibrillation ([place]) 12/02/2010  . PPM-[place] 12/02/2010    Past Surgical History:  Procedure Laterality Date  . BACK SURGERY    . CARDIAC CATHETERIZATION  1990  . CATARACT EXTRACTION W/ INTRAOCULAR LENS  IMPLANT, BILATERAL Bilateral 10/2006 ~ 11/2006  . CORONARY ANGIOPLASTY    . CORONARY ARTERY BYPASS GRAFT  1990   CABG X3  . ELBOW SURGERY Left    "  related to gout"  . INSERT / REPLACE / REMOVE PACEMAKER  08/2010  . KNEE ARTHROSCOPY Left   . LUMBAR DISC SURGERY     "had pinched nerve; had to make room for it"  . NASAL SINUS SURGERY  1980   "related to bacteria infection in sinus"  . SQUAMOUS CELL CARCINOMA EXCISION Right 09/12/2015   cheek  . TONSILLECTOMY  1972       Home Medications    Prior to Admission medications   Medication Sig Start Date End Date Taking? Authorizing Provider  acetaminophen (TYLENOL) 500 MG tablet Take 500 mg by mouth at bedtime.    Yes [provider]  allopurinol (ZYLOPRIM) 300 MG tablet Take 300 mg by mouth daily.    Yes  [provider]  ALPRAZolam Duanne Moron) 0.5 MG tablet Take 1 tablet (0.5 mg total) by mouth at bedtime as needed for anxiety. For sleep Patient taking differently: Take 0.5 mg by mouth at bedtime. For sleep 03/23/17  Yes Geradine Girt, DO  aspirin 81 MG tablet Take 81 mg by mouth every other day.    Yes [provider]  atenolol (TENORMIN) 50 MG tablet Take 50 mg by mouth daily.    Yes [provider]  Cholecalciferol 2000 UNITS CAPS Take 4,000 Units by mouth daily.   Yes [provider]  clotrimazole-betamethasone (LOTRISONE) cream Apply 1 application topically as directed. 08/11/17  Yes [provider]  colchicine 0.6 MG tablet Take 0.6 mg by mouth daily as needed (gout).    Yes [provider]  furosemide (LASIX) 80 MG tablet Take 80 mg by mouth daily. Or as directed   Yes [provider]  gabapentin (NEURONTIN) 100 MG capsule Take 100 mg by mouth 3 (three) times daily. Takes at 0900,1700,1900 daily   Yes [provider]  metolazone (ZAROXOLYN) 2.5 MG tablet Take 2.5 mg by mouth daily as needed (for pain).   Yes [provider]  nitroGLYCERIN (NITROSTAT) 0.4 MG SL tablet Place 0.4 mg under the tongue every 5 (five) minutes as needed for chest pain.   Yes [provider]  potassium chloride SA (K-DUR,KLOR-CON) 20 MEQ tablet Take 40 mEq by mouth 2 (two) times daily.    Yes [provider]  triamcinolone cream (KENALOG) 0.1 % Apply 1 application topically daily as needed for rash. 03/10/17  Yes [provider]  warfarin (COUMADIN) 2.5 MG tablet Take 2.5 mg by mouth daily at 6 PM. Or as directed   Yes [provider]  polyethylene glycol (MIRALAX / GLYCOLAX) packet Take 17 g by mouth daily as needed for mild constipation.    [provider]  sennosides-docusate sodium (SENOKOT-S) 8.6-50 MG tablet Take 2 tablets by mouth at bedtime.    [provider]    Family  History Family History  Problem Relation Age of Onset  . Obesity Mother   . Lung disease Father   . Diabetes type II Sister   . Heart disease Brother   . Diabetes type II Daughter     Social History Social History  Substance Use Topics  . Smoking status: Former Smoker    Packs/day: 1.00    Years: 1.00    Types: Cigarettes  . Smokeless tobacco: Never Used  . Alcohol use No     Allergies   Avelox [moxifloxacin hcl in nacl]; Indomethacin; Levaquin [levofloxacin]; Lipitor [atorvastatin]; Moxifloxacin; Pravachol [pravastatin sodium]; Aleve [naproxen sodium]; Norpace [disopyramide phosphate]; Disopyramide; Morphine; Pravastatin; Amlodipine besylate; Azithromycin; Bactrim [sulfamethoxazole-trimethoprim];  Clinoril [sulindac]; Codeine; Digoxin; Lorazepam; Morphine and related; Other; Oxycodone-acetaminophen; Percocet [oxycodone-acetaminophen]; Sulfamethoxazole-trimethoprim; Sulfonamide derivatives; and Uloric [febuxostat]   Review of Systems Review of Systems  All other systems reviewed and are negative.    Physical Exam Updated Vital Signs BP (!) 112/48   Pulse 69   Temp 98.4 F (36.9 C) (Oral)   Resp (!) 23   Ht 5\' 3"  (1.6 m)   Wt 84.8 kg (187 lb)   SpO2 93%   BMI 33.13 kg/m   Physical Exam  Constitutional: He appears well-developed and well-nourished. No distress.  HENT:  Head: Normocephalic and atraumatic.  Mouth/Throat: Oropharynx is clear and moist. No oropharyngeal exudate.  Eyes: Pupils are equal, round, and reactive to light. Conjunctivae and EOM are normal. Right eye exhibits no discharge. Left eye exhibits no discharge. No scleral icterus.  Neck: Normal range of motion. Neck supple. No JVD present. No thyromegaly present.  Cardiovascular: Normal rate, regular rhythm, normal heart sounds and intact distal pulses.  Exam reveals no gallop and no friction rub.   No murmur heard. Pulmonary/Chest: Effort normal. No respiratory distress. He has wheezes. He has rales.   Subtle rales at the bases on inspiration, expiratory wheezing in all lung fields, speaks in slightly shortened sentences  Abdominal: Soft. Bowel sounds are normal. He exhibits no distension and no mass. There is no tenderness.  Musculoskeletal: Normal range of motion. He exhibits edema ( Scant symmetrical lower extremity pitting edema). He exhibits no tenderness.  Lymphadenopathy:    He has no cervical adenopathy.  Neurological: He is alert. Coordination normal.  The patient is alert and oriented and able to follow commands. He seemingly has normal strength in all 4 extremities with no facial droop.  Skin: Skin is warm and dry. No rash noted. No erythema.  Left lower extremity dressing removed, wounds to the lateral left lower extremity examined, no purulent drainage, no surrounding erythema induration or tenderness  Psychiatric: He has a normal mood and affect. His behavior is normal.  Nursing note and vitals reviewed.   ED Treatments / Results  Labs (all labs ordered are listed, but only abnormal results are displayed) Labs Reviewed  CBC WITH DIFFERENTIAL/PLATELET - Abnormal; Notable for the following:       Result Value   RBC 3.98 (*)    MCV 102.0 (*)    All other components within normal limits  COMPREHENSIVE METABOLIC PANEL - Abnormal; Notable for the following:    Glucose, Bld 103 (*)    Albumin 3.1 (*)    ALT 14 (*)    All other components within normal limits  BRAIN NATRIURETIC PEPTIDE - Abnormal; Notable for the following:    B Natriuretic Peptide 272.4 (*)    All other components within normal limits  URINE CULTURE  TROPONIN I  MAGNESIUM  URINALYSIS, ROUTINE W REFLEX MICROSCOPIC    EKG  EKG Interpretation  Date/Time:  Sunday August 15 2017 09:09:40 EDT Ventricular Rate:  68 PR Interval:    QRS Duration: 154 QT Interval:  551 QTC Calculation: 599 R Axis:   -117 Text Interpretation:  Ventricular-paced complexes No further rhythm analysis attempted due to paced  rhythm Nonspecific IVCD with LAD Borderline abnrm T, anterolateral leads since last tracing no significant change Confirmed by Noemi Chapel [phone]) on 08/15/2017 9:23:40 AM       Radiology Dg Chest Port 1 View  Result Date: 08/15/2017 CLINICAL DATA:  Shortness of breath and cough. EXAM: PORTABLE CHEST 1 VIEW COMPARISON:  Chest x-ray  dated Mar 21, 2017. FINDINGS: Left chest wall AICD in unchanged position. Stable cardiomegaly and postsurgical changes related to prior CABG. Mild pulmonary vascular congestion. Low lung volumes with bibasilar atelectasis. Unchanged rounded density in the right lower lung, most likely a nipple shadow. No focal consolidation, pleural effusion, or pneumothorax. No acute osseous abnormality. IMPRESSION: Mild pulmonary vascular congestion without overt edema. Electronically Signed   By: Titus Dubin M.D.   On: 08/15/2017 09:57    Procedures Procedures (including critical care time)  Medications Ordered in ED Medications  ipratropium-albuterol (DUONEB) 0.5-2.5 (3) MG/3ML nebulizer solution 3 mL (3 mLs Nebulization Given 08/15/17 0932)     Initial Impression / Assessment and Plan / ED Course  I have reviewed the triage vital signs and the nursing notes.  Pertinent labs & imaging results that were available during my care of the patient were reviewed by me and considered in my medical decision making (see chart for details).     Generalized weakness could be multifactorial but of specific concern is congestive heart failure exacerbation, reactive airway disease or pneumonia, consider electrolyte disturbance or urinary tract infection as well. The patient does not appear critically ill but does have wheezing and would benefit from an albuterol nebulizer. This could be related to cardiac wheezing congestive heart failure. Labs pending, nebulizer treatment ordered.  D/w Dr. Aggie Moats for possible admission The pt has increased SOB  / ongoing wheezing and weakness with  trying to walk that will preclude a safe discharge - attempted ambulation with 2 staff at the bedside and pt was severely weak and required significant assistance.  Final Clinical Impressions(s) / ED Diagnoses   Final diagnoses:  Acute bronchospasm  Generalized weakness    New Prescriptions New Prescriptions   No medications on file     Noemi Chapel, MD 08/15/17 1120

## 2017-08-15 NOTE — ED Notes (Signed)
Report attempted 

## 2017-08-16 ENCOUNTER — Observation Stay (HOSPITAL_BASED_OUTPATIENT_CLINIC_OR_DEPARTMENT_OTHER): Payer: Medicare Other

## 2017-08-16 DIAGNOSIS — I361 Nonrheumatic tricuspid (valve) insufficiency: Secondary | ICD-10-CM | POA: Diagnosis not present

## 2017-08-16 DIAGNOSIS — I5033 Acute on chronic diastolic (congestive) heart failure: Secondary | ICD-10-CM | POA: Diagnosis not present

## 2017-08-16 DIAGNOSIS — R531 Weakness: Secondary | ICD-10-CM

## 2017-08-16 DIAGNOSIS — R0602 Shortness of breath: Secondary | ICD-10-CM | POA: Diagnosis not present

## 2017-08-16 DIAGNOSIS — R0600 Dyspnea, unspecified: Secondary | ICD-10-CM

## 2017-08-16 DIAGNOSIS — J9601 Acute respiratory failure with hypoxia: Secondary | ICD-10-CM | POA: Diagnosis not present

## 2017-08-16 DIAGNOSIS — I482 Chronic atrial fibrillation: Secondary | ICD-10-CM | POA: Diagnosis not present

## 2017-08-16 LAB — ECHOCARDIOGRAM COMPLETE
Height: 63 in
WEIGHTICAEL: 2827.2 [oz_av]

## 2017-08-16 LAB — BASIC METABOLIC PANEL
Anion gap: 6 (ref 5–15)
BUN: 17 mg/dL (ref 6–20)
CALCIUM: 9.3 mg/dL (ref 8.9–10.3)
CO2: 31 mmol/L (ref 22–32)
Chloride: 100 mmol/L — ABNORMAL LOW (ref 101–111)
Creatinine, Ser: 0.96 mg/dL (ref 0.61–1.24)
Glucose, Bld: 97 mg/dL (ref 65–99)
POTASSIUM: 3.7 mmol/L (ref 3.5–5.1)
Sodium: 137 mmol/L (ref 135–145)

## 2017-08-16 LAB — PROTIME-INR
INR: 1.81
Prothrombin Time: 20.8 seconds — ABNORMAL HIGH (ref 11.4–15.2)

## 2017-08-16 LAB — URINE CULTURE

## 2017-08-16 MED ORDER — MAGNESIUM SULFATE 2 GM/50ML IV SOLN
2.0000 g | Freq: Once | INTRAVENOUS | Status: AC
  Administered 2017-08-16: 2 g via INTRAVENOUS
  Filled 2017-08-16: qty 50

## 2017-08-16 MED ORDER — LORATADINE 10 MG PO TABS
10.0000 mg | ORAL_TABLET | Freq: Every day | ORAL | Status: DC
  Administered 2017-08-16 – 2017-08-18 (×3): 10 mg via ORAL
  Filled 2017-08-16 (×3): qty 1

## 2017-08-16 MED ORDER — FUROSEMIDE 10 MG/ML IJ SOLN
60.0000 mg | Freq: Once | INTRAMUSCULAR | Status: AC
  Administered 2017-08-16: 60 mg via INTRAVENOUS
  Filled 2017-08-16: qty 6

## 2017-08-16 MED ORDER — METOLAZONE 2.5 MG PO TABS: 2.5000 mg | ORAL_TABLET | Freq: Every day | ORAL | Status: DC | PRN

## 2017-08-16 MED ORDER — WARFARIN SODIUM 2 MG PO TABS
4.0000 mg | ORAL_TABLET | Freq: Once | ORAL | Status: AC
  Administered 2017-08-16: 4 mg via ORAL
  Filled 2017-08-16: qty 2

## 2017-08-16 MED ORDER — FUROSEMIDE 80 MG PO TABS
100.0000 mg | ORAL_TABLET | Freq: Every day | ORAL | Status: DC
  Administered 2017-08-16 – 2017-08-17 (×2): 100 mg via ORAL
  Filled 2017-08-16 (×2): qty 1

## 2017-08-16 NOTE — Evaluation (Signed)
Physical Therapy Evaluation Patient Details Name: [name] MRN: [id number] DOB: May 08, 1923 Today's Date: 08/16/2017   History of Present Illness  83 y.o. male  with past medical history significant for atrial fibrillation, CHF, coronary disease, gout, myocardial infarction, non-Hodgkin's lymphoma presents to the hospital with chief complaint of dyspnea and weakness. Dx exacerbation of CHF with associated weakness  Clinical Impression  Pt admitted with above diagnosis. Pt currently with functional limitations due to the deficits listed below (see PT Problem List). Pt is limited by DoE and L LE weakness. Pt is currently min guard for transfers and ambulation of 100 feet with RW. Pt experienced 1x LoB requiring minA for steadying. Pt reports that he is still a little weak but close to his baseline level of function and does not want to go to a rehab facility, pt would agree to [facility] for strengthening if not too expensive.   Pt will benefit from skilled PT to increase their independence and safety with mobility to allow discharge to the venue listed below.       Follow Up Recommendations Home health PT;Supervision - Intermittent    Equipment Recommendations  None recommended by PT    Recommendations for Other Services       Precautions / Restrictions Precautions Precautions: None Restrictions Weight Bearing Restrictions: No      Mobility  Bed Mobility               General bed mobility comments: pt in recliner at beginning of session  Transfers Overall transfer level: Needs assistance Equipment used: Rolling walker (2 wheeled) Transfers: Sit to/from Stand Sit to Stand: Min guard         General transfer comment: min guard for safety, able to power up, requires increased time to reach over to walker for steading, overall safe  Ambulation/Gait Ambulation/Gait assistance: Min guard;Min assist Ambulation Distance (Feet): 100 Feet Assistive device: Rolling walker (2  wheeled) Gait Pattern/deviations: Step-through pattern;Decreased step length - right;Decreased step length - left;Shuffle Gait velocity: slowed Gait velocity interpretation: Below normal speed for age/gender General Gait Details: hands on min guard for safety, 1xLoB requiring minA for steadying, secondary to shoes too large to accomodate for [employer] but [employer] had been removed, educated pt on need to make sure that footwear is appropriate        Balance Overall balance assessment: Needs assistance Sitting-balance support: Feet supported;No upper extremity supported Sitting balance-Leahy Scale: Good     Standing balance support: Bilateral upper extremity supported Standing balance-Leahy Scale: Poor Standing balance comment: requires UE support for static balance                             Pertinent Vitals/Pain Pain Assessment: No/denies pain    Home Living Family/patient expects to be discharged to:: Private residence Living Arrangements: Alone Available Help at Discharge: Family;Available PRN/intermittently Type of Home: House Home Access: Ramped entrance     Home Layout: One level Home Equipment: Grab bars - toilet;Grab bars - tub/shower;Walker - 2 wheels;Walker - 4 wheels;Tub bench      Prior Function Level of Independence: Needs assistance   Gait / Transfers Assistance Needed: has lift chair that he sleeps in   ADL's / [place] Needed: help with cleaning/cooking 3 days a week, niece helps with bathing, able to dress LE            Extremity/Trunk Assessment   Upper Extremity Assessment Upper Extremity  Assessment: Overall WFL for tasks assessed    Lower Extremity Assessment Lower Extremity Assessment: LLE deficits/detail;RLE deficits/detail RLE Deficits / Details: ROM WFL, strength grossly 3/5 LLE Deficits / Details: L calf wrapped with gauze over nonhealing wounds.  ROM WFL, strength grossly 3-/5 LLE Sensation:  (in tact)        Communication   Communication: HOH  Cognition Arousal/Alertness: Awake/alert Behavior During Therapy: WFL for tasks assessed/performed Overall Cognitive Status: Within Functional Limits for tasks assessed                                        General Comments General comments (skin integrity, edema, etc.): SaO2 on RA 96% pre and post activity, HR 72 bpm with ambulation         Assessment/Plan    PT Assessment Patient needs continued PT services  PT Problem List Decreased strength;Decreased activity tolerance;Decreased balance;Decreased range of motion;Decreased mobility;Decreased safety awareness;Cardiopulmonary status limiting activity       PT Treatment Interventions DME instruction;Gait training;Functional mobility training;Therapeutic activities;Therapeutic exercise;Balance training;Patient/family education    PT Goals (Current goals can be found in the Care Plan section)  Acute Rehab PT Goals Patient Stated Goal: stay in his home as long as possible PT Goal Formulation: With patient Time For Goal Achievement: 08/30/17 Potential to Achieve Goals: Fair    Frequency Min 3X/week   Barriers to discharge Decreased caregiver support         AM-PAC PT "6 Clicks" Daily Activity  Outcome Measure Difficulty turning over in bed (including adjusting bedclothes, sheets and blankets)?: A Little Difficulty moving from lying on back to sitting on the side of the bed? : A Lot Difficulty sitting down on and standing up from a chair with arms (e.g., wheelchair, bedside commode, etc,.)?: A Little Help needed moving to and from a bed to chair (including a wheelchair)?: A Little Help needed walking in hospital room?: A Little Help needed climbing 3-5 steps with a railing? : A Lot 6 Click Score: 16    End of Session Equipment Utilized During Treatment: Gait belt Activity Tolerance: Patient tolerated treatment well Patient left: in chair;with call bell/phone within  reach;with family/visitor present Nurse Communication: Mobility status PT Visit Diagnosis: Unsteadiness on feet (R26.81);Other abnormalities of gait and mobility (R26.89);Muscle weakness (generalized) (M62.81);Difficulty in walking, not elsewhere classified (R26.2)    Time: 2951-8841 PT Time Calculation (min) (ACUTE ONLY): 36 min   Charges:   PT Evaluation $PT Eval Moderate Complexity: 1 Mod PT Treatments $Gait Training: 8-22 mins   PT G Codes:   PT G-Codes **NOT FOR INPATIENT CLASS** Functional Assessment Tool Used: AM-PAC 6 Clicks Basic Mobility Functional Limitation: Mobility: Walking and moving around Mobility: Walking and Moving Around Current Status (Y6063): At least 40 percent but less than 60 percent impaired, limited or restricted Mobility: Walking and Moving Around Goal Status [phone]): At least 1 percent but less than 20 percent impaired, limited or restricted    Benjamine Mola B. Migdalia Dk PT, DPT Acute Rehabilitation  [phone] Pager [phone]    [place] 08/16/2017, 10:06 AM

## 2017-08-16 NOTE — Care Management Obs Status (Signed)
[facility] NOTIFICATION   Patient Details  Name: [name] MRN: [id number] Date of Birth: Apr 21, 1923   Medicare Observation Status Notification Given:  Yes    Royston Bake, RN 08/16/2017, 4:05 PM

## 2017-08-16 NOTE — Care Management Note (Signed)
Case Management Note  Patient Details  Name: [name] MRN: [id number] Date of Birth: 10-12-1923  Subjective/Objective:      CHF             Action/Plan: Patient lives at home alone, his son takes him to his apts; PCP: Lavone Orn, MD; has private insurance with BCBS with prescription drug coverage; pharmacy of choice is [facility]; DME - walker at home; patient is active with [facility] as prior to admission for [facility] and PT; CM will continue to follow for DCP  Expected Discharge Date:     Possibly 08/20/2017             Expected Discharge Plan:  [facility]  Discharge planning Services  CM Consult  Choice offered to:  Patient  HH Arranged:  RN, PT [facility] Agency:  [facility]  Status of Service:  In process, will continue to follow  Sherrilyn Rist [phone] 08/16/2017, 2:32 PM

## 2017-08-16 NOTE — Consult Note (Signed)
[facility] Nurse wound consult note Reason for Consult: leg wounds Patient with known history of venous stasis, managed at home with Unna's boots and topical care.  Currently with Unna's boots change 3x wk Wound type: Venous stasis 3 sites: distal 0.5cm x 0.5cm x0.1cm Medial: 6cmx 2cm x 0.1cm  Proximal: 1cm x 0.5cm x 0.1cm  Measurement: see above Wound bed: mostly clean ulcers with small amount of fibrin in each wound base Drainage (amount, consistency, odor) minimal, serosanguinous  Periwound: intact, edema but minimal  Dressing procedure/placement/frequency: Silver hydrofiber over the wounds, cover with foam. Unna's boots bilaterally, change 3x wk per ortho tech.   Discussed POC with patient and bedside nurse.  Re consult if needed, will not follow at this time. Thanks  Sharone Almond [employer], RN,CWOCN, CNS, [place] [phone])

## 2017-08-16 NOTE — Progress Notes (Signed)
Orthopedic Tech Progress Note Patient Details:  [name] 1923-05-30 [id number]  Ortho Devices Type of Ortho Device: Unna boot Ortho Device/Splint Location: applied unna boots to both left and right legs.  bilateral application.  pt tolerated appllication very well.  bilateral.   Ortho Device/Splint Interventions: Application   Kristopher Oppenheim 08/16/2017, 3:48 PM

## 2017-08-16 NOTE — Progress Notes (Signed)
ANTICOAGULATION CONSULT NOTE  Pharmacy Consult:  Coumadin Indication: atrial fibrillation    Patient Measurements: Height: 5\' 3"  (160 cm) Weight: 176 lb 11.2 oz (80.2 kg) (scale b) IBW/kg (Calculated) : 56.9  Vital Signs: Temp: 98.1 F (36.7 C) (10/08 0636) Temp Source: Oral (10/08 0636) BP: 140/57 (10/08 0636) Pulse Rate: 75 (10/08 0636)  Labs:  Recent Labs  08/15/17 0917 08/15/17 1332 08/15/17 2051 08/16/17 0459  HGB 13.1  --   --   --   HCT 40.6  --   --   --   PLT 174  --   --   --   LABPROT  --  19.8*  --  20.8*  INR  --  1.70  --  1.81  CREATININE 1.00  --   --  0.96  TROPONINI <0.03 <0.03 <0.03  --     Estimated Creatinine Clearance: 46 mL/min (by C-G formula based on SCr of 0.96 mg/dL).   Assessment: 89 YOM with history of Afib to continue on Coumadin from PTA.  Patient's INR is sub-therapeutic on admit at 1.7.  CBC stable; no bleeding reported.  INR today = 1.81  Home Coumadin dose: 2.5mg  PO daily   Goal of Therapy:  INR 2-3 Monitor platelets by anticoagulation protocol: Yes    Plan:  Repeat Coumadin 4mg  PO today Daily PT / INR  Thank you Anette Guarneri, PharmD [phone]  08/16/2017, 8:44 AM

## 2017-08-16 NOTE — Progress Notes (Signed)
[facility]  Patient Status: Active (receiving services up to time of hospitalization)  [facility] is providing the following services: RN  If patient discharges after hours, please call [phone].   [name] 08/16/2017, 2:44 PM

## 2017-08-16 NOTE — Progress Notes (Addendum)
PROGRESS NOTE    CHESKY HEYER  ZOX:[id number] DOB: 12-Jan-1923 DOA: 08/15/2017 PCP: Lavone Orn, MD   Outpatient Specialists:     Brief Narrative:  [name] is a 83 y.o. male  with past medical history significant for atrial fibrillation, CHF, coronary disease, gout, myocardial infarction, non-Hodgkin's lymphoma presents to the hospital with chief complaint of dyspnea and weakness. Patient denies any sick contacts or recent medication changes. Has been dyspneic with a dry cough for about the last week. Over last 24 hours patient's cough became worse. Patient had trouble ambulating and standing. Patient's son brought patient to the hospital for evaluation.  ED course: X-ray done that shows basilar congestion. Patient given a DuoNeb. Some relief with nebulizer treatment and cough became productive. On standing patient was very unsteady, and almost toppled over. Hospitalist consulted for admission.    Assessment & Plan:   Principal Problem:   Dyspnea Active Problems:   Atrial fibrillation (HCC)   Venous stasis ulcer (HCC)   Generalized weakness   Acute on chronic diastolic CHF exacerbation/Dyspnea Echo: Mild hypokinesis of the inferior wall with overall normal LV   function; moderate LVH; mild MR; mild LAE/RAE/RVE; mild TR;   mildly elevated pulmonary pressure. Given IV lasix x 1- down 2.4L -weights do not appear accurate -increase home dose of lasix-- will need to continue to watch output -plan to give dose of IV lasix this PM as well -patient does not like to take metolazone as it makes him weak -patient also does not weigh daily -check x ray in AM  Weakness -lives alone but refusing SNF placement -on coumadin so   Continued cough -improved with diuresis but still coughing -add allergymed  Gout Continue allopurinol Hold colchicine as acute attack  CAD Continue aspirin When necessary nitroglycerin  Atrial fibrillation Continue Tenormin Continue  warfarin  Chronic pain Hold Neurontin  Constipation Continue Senokot and MiraLAX  Anxiety/insomnia When necessary Ativan  Venous stasis ulcer Wound care nurse consult    DVT prophylaxis:   Fully anticoagulated   Code Status: Full Code   Family Communication:   Disposition Plan:     Consultants:        Subjective: Still coughing  Objective: Vitals:   08/15/17 1245 08/15/17 1338 08/15/17 2140 08/16/17 0636  BP: (!) 126/55 (!) 141/72 (!) 113/51 (!) 140/57  Pulse: 66 68 71 75  Resp: (!) 24 18 18 18   Temp:  98.9 F (37.2 C) 98.8 F (37.1 C) 98.1 F (36.7 C)  TempSrc:  Oral Oral Oral  SpO2: 98% 98% 97% 97%  Weight:  92.2 kg (203 lb 4.2 oz)  80.2 kg (176 lb 11.2 oz)  Height:  5\' 3"  (1.6 m)      Intake/Output Summary (Last 24 hours) at 08/16/17 1502 Last data filed at 08/16/17 0937  Gross per 24 hour  Intake              543 ml  Output             2825 ml  Net            -2282 ml   Filed Weights   08/15/17 0910 08/15/17 1338 08/16/17 0636  Weight: 84.8 kg (187 lb) 92.2 kg (203 lb 4.2 oz) 80.2 kg (176 lb 11.2 oz)    Examination:  General exam: Appears calm and comfortable - in chair Respiratory system: Crackles throughout Cardiovascular system: S1 & S2 heard, RRR. No JVD, murmurs, rubs, gallops or clicks. No pedal  edema. Gastrointestinal system: Abdomen is nondistended, soft and nontender. No organomegaly or masses felt. Normal bowel sounds heard. Central nervous system: Alert and oriented. No focal neurological deficits. Extremities: Symmetric 5 x 5 power. Skin: No rashes, lesions or ulcers Psychiatry: Judgement and insight appear normal. Mood & affect appropriate.     Data Reviewed: I have personally reviewed following labs and imaging studies  CBC:  Recent Labs Lab 08/15/17 0917  WBC 10.1  NEUTROABS 6.1  HGB 13.1  HCT 40.6  MCV 102.0*  PLT 979   Basic Metabolic Panel:  Recent Labs Lab 08/15/17 0917 08/16/17 0459  NA  138 137  K 4.0 3.7  CL 102 100*  CO2 27 31  GLUCOSE 103* 97  BUN 19 17  CREATININE 1.00 0.96  CALCIUM 9.8 9.3  MG 1.8  --    GFR: Estimated Creatinine Clearance: 46 mL/min (by C-G formula based on SCr of 0.96 mg/dL). Liver Function Tests:  Recent Labs Lab 08/15/17 0917  AST 24  ALT 14*  ALKPHOS 69  BILITOT 0.8  PROT 7.8  ALBUMIN 3.1*   No results for input(s): LIPASE, AMYLASE in the last 168 hours. No results for input(s): AMMONIA in the last 168 hours. Coagulation Profile:  Recent Labs Lab 08/15/17 1332 08/16/17 0459  INR 1.70 1.81   Cardiac Enzymes:  Recent Labs Lab 08/15/17 0917 08/15/17 1332 08/15/17 2051  TROPONINI <0.03 <0.03 <0.03   BNP (last 3 results) No results for input(s): PROBNP in the last 8760 hours. HbA1C: No results for input(s): HGBA1C in the last 72 hours. CBG: No results for input(s): GLUCAP in the last 168 hours. Lipid Profile: No results for input(s): CHOL, HDL, LDLCALC, TRIG, CHOLHDL, LDLDIRECT in the last 72 hours. Thyroid Function Tests: No results for input(s): TSH, T4TOTAL, FREET4, T3FREE, THYROIDAB in the last 72 hours. Anemia Panel: No results for input(s): VITAMINB12, FOLATE, FERRITIN, TIBC, IRON, RETICCTPCT in the last 72 hours. Urine analysis:    Component Value Date/Time   COLORURINE YELLOW 08/15/2017 1000   APPEARANCEUR CLEAR 08/15/2017 1000   LABSPEC 1.013 08/15/2017 1000   PHURINE 6.0 08/15/2017 1000   GLUCOSEU NEGATIVE 08/15/2017 1000   HGBUR NEGATIVE 08/15/2017 1000   BILIRUBINUR NEGATIVE 08/15/2017 1000   KETONESUR NEGATIVE 08/15/2017 1000   PROTEINUR NEGATIVE 08/15/2017 1000   UROBILINOGEN 0.2 07/08/2015 0428   NITRITE NEGATIVE 08/15/2017 1000   LEUKOCYTESUR NEGATIVE 08/15/2017 1000     Recent Results (from the past 240 hour(s))  Urine Culture     Status: Abnormal   Collection Time: 08/15/17 10:00 AM  Result Value Ref Range Status   Specimen Description URINE, CLEAN CATCH  Final   Special Requests  NONE  Final   Culture MULTIPLE SPECIES PRESENT, SUGGEST RECOLLECTION (A)  Final   Report Status 08/16/2017 FINAL  Final      Anti-infectives    None       Radiology Studies: Dg Chest Port 1 View  Result Date: 08/15/2017 CLINICAL DATA:  Shortness of breath and cough. EXAM: PORTABLE CHEST 1 VIEW COMPARISON:  Chest x-ray dated Mar 21, 2017. FINDINGS: Left chest wall AICD in unchanged position. Stable cardiomegaly and postsurgical changes related to prior CABG. Mild pulmonary vascular congestion. Low lung volumes with bibasilar atelectasis. Unchanged rounded density in the right lower lung, most likely a nipple shadow. No focal consolidation, pleural effusion, or pneumothorax. No acute osseous abnormality. IMPRESSION: Mild pulmonary vascular congestion without overt edema. Electronically Signed   By: Titus Dubin M.D.   On:  08/15/2017 09:57        Scheduled Meds: . allopurinol  300 mg Oral Daily  . aspirin EC  81 mg Oral QODAY  . atenolol  50 mg Oral Daily  . furosemide  100 mg Oral Daily  . potassium chloride SA  40 mEq Oral BID  . senna-docusate  2 tablet Oral QHS  . sodium chloride flush  3 mL Intravenous Q12H  . warfarin  4 mg Oral ONCE-1800  . Warfarin - Pharmacist Dosing Inpatient   Does not apply q1800   Continuous Infusions: . sodium chloride       LOS: 0 days    Time spent: 35 min    [place], DO [facility] Pager [phone]  If 7PM-7AM, please contact night-coverage [email or web address] Password [email or web address] 08/16/2017, 3:02 PM

## 2017-08-17 ENCOUNTER — Observation Stay (HOSPITAL_COMMUNITY): Payer: Medicare Other

## 2017-08-17 DIAGNOSIS — I2510 Atherosclerotic heart disease of native coronary artery without angina pectoris: Secondary | ICD-10-CM | POA: Diagnosis not present

## 2017-08-17 DIAGNOSIS — J9600 Acute respiratory failure, unspecified whether with hypoxia or hypercapnia: Secondary | ICD-10-CM

## 2017-08-17 DIAGNOSIS — R531 Weakness: Secondary | ICD-10-CM | POA: Diagnosis not present

## 2017-08-17 DIAGNOSIS — I5033 Acute on chronic diastolic (congestive) heart failure: Secondary | ICD-10-CM | POA: Diagnosis not present

## 2017-08-17 DIAGNOSIS — I482 Chronic atrial fibrillation: Secondary | ICD-10-CM

## 2017-08-17 DIAGNOSIS — I2583 Coronary atherosclerosis due to lipid rich plaque: Secondary | ICD-10-CM | POA: Diagnosis not present

## 2017-08-17 DIAGNOSIS — J9601 Acute respiratory failure with hypoxia: Secondary | ICD-10-CM | POA: Diagnosis not present

## 2017-08-17 DIAGNOSIS — I509 Heart failure, unspecified: Secondary | ICD-10-CM | POA: Diagnosis not present

## 2017-08-17 DIAGNOSIS — R0600 Dyspnea, unspecified: Secondary | ICD-10-CM | POA: Diagnosis not present

## 2017-08-17 DIAGNOSIS — R0602 Shortness of breath: Secondary | ICD-10-CM | POA: Diagnosis not present

## 2017-08-17 DIAGNOSIS — J9801 Acute bronchospasm: Secondary | ICD-10-CM | POA: Diagnosis not present

## 2017-08-17 LAB — BASIC METABOLIC PANEL
ANION GAP: 12 (ref 5–15)
BUN: 23 mg/dL — ABNORMAL HIGH (ref 6–20)
CO2: 29 mmol/L (ref 22–32)
Calcium: 9.5 mg/dL (ref 8.9–10.3)
Chloride: 98 mmol/L — ABNORMAL LOW (ref 101–111)
Creatinine, Ser: 1.22 mg/dL (ref 0.61–1.24)
GFR calc Af Amer: 57 mL/min — ABNORMAL LOW (ref >60)
GFR calc non Af Amer: 50 mL/min — ABNORMAL LOW (ref >60)
GLUCOSE: 98 mg/dL (ref 65–99)
POTASSIUM: 3.7 mmol/L (ref 3.5–5.1)
Sodium: 139 mmol/L (ref 135–145)

## 2017-08-17 LAB — CBC
HEMATOCRIT: 43.4 % (ref 39.0–52.0)
Hemoglobin: 13.8 g/dL (ref 13.0–17.0)
MCH: 32.2 pg (ref 26.0–34.0)
MCHC: 31.8 g/dL (ref 30.0–36.0)
MCV: 101.4 fL — AB (ref 78.0–100.0)
Platelets: 198 10*3/uL (ref 150–400)
RBC: 4.28 MIL/uL (ref 4.22–5.81)
RDW: 14.8 % (ref 11.5–15.5)
WBC: 8.3 10*3/uL (ref 4.0–10.5)

## 2017-08-17 LAB — PROTIME-INR
INR: 1.87
Prothrombin Time: 21.4 seconds — ABNORMAL HIGH (ref 11.4–15.2)

## 2017-08-17 MED ORDER — FUROSEMIDE 10 MG/ML IJ SOLN
60.0000 mg | Freq: Once | INTRAMUSCULAR | Status: AC
Start: 2017-08-17 — End: 2017-08-17
  Administered 2017-08-17: 60 mg via INTRAVENOUS
  Filled 2017-08-17: qty 6

## 2017-08-17 MED ORDER — ALBUTEROL SULFATE (2.5 MG/3ML) 0.083% IN NEBU
2.5000 mg | INHALATION_SOLUTION | RESPIRATORY_TRACT | Status: DC | PRN
  Administered 2017-08-17: 2.5 mg via RESPIRATORY_TRACT
  Filled 2017-08-17: qty 3

## 2017-08-17 MED ORDER — WARFARIN SODIUM 5 MG PO TABS
5.0000 mg | ORAL_TABLET | Freq: Once | ORAL | Status: AC
  Administered 2017-08-17: 5 mg via ORAL
  Filled 2017-08-17: qty 1

## 2017-08-17 NOTE — Progress Notes (Signed)
ANTICOAGULATION CONSULT NOTE  Pharmacy Consult:  Coumadin Indication: atrial fibrillation  Patient Measurements: Height: 5\' 3"  (160 cm) Weight: 176 lb 3.2 oz (79.9 kg) IBW/kg (Calculated) : 56.9  Vital Signs: Temp: 97.2 F (36.2 C) (10/09 0530) Temp Source: Oral (10/09 0530) BP: 124/61 (10/09 0530) Pulse Rate: 88 (10/09 0530)  Labs:  Recent Labs  08/15/17 0917 08/15/17 1332 08/15/17 2051 08/16/17 0459 08/17/17 0355  HGB 13.1  --   --   --  13.8  HCT 40.6  --   --   --  43.4  PLT 174  --   --   --  198  LABPROT  --  19.8*  --  20.8* 21.4*  INR  --  1.70  --  1.81 1.87  CREATININE 1.00  --   --  0.96 1.22  TROPONINI <0.03 <0.03 <0.03  --   --     Estimated Creatinine Clearance: 36.1 mL/min (by C-G formula based on SCr of 1.22 mg/dL).   Assessment: 75 YOM with history of Afib to continue on Coumadin from PTA.  Patient's INR is sub-therapeutic on admit at 1.7.  CBC stable; no bleeding reported.  INR today = 1.87  Home Coumadin dose: 2.5mg  PO daily Hospital doses since admission (10/7): 4mg  x2    Goal of Therapy:  INR 2-3 Monitor platelets by anticoagulation protocol: Yes    Plan:  Warfarin 5mg  PO today Daily PT / INR  Thank you Florinda Marker, Student Pharmacist 08/17/2017, 8:29 AM

## 2017-08-17 NOTE — Consult Note (Signed)
Cardiology Consultation:   Patient ID: ERI PLATTEN; [id number]; 11/16/1922   Admit date: 08/15/2017 Date of Consult: 08/17/2017  Primary Care Provider: Lavone Orn, MD Primary Cardiologist: Dr. Tamala Julian Electrophysiologist: Dr. Lovena Le    Patient Profile:   [name] is a 83 y.o. male with a hx of chronic afib, tachybrady syndrome s/p PPM, h/o systolic LV dysfunction (EF back to normal), HTN, h/o CAD and CABG in the 1990s and h/o non-Hodgkin's lymphoma, who is being seen today for the evaluation of HFpEF at the request of Dr. Eliseo Squires, Internal Medicine.  History of Present Illness:   Mr. Selsor has been followed primarily by Dr. Lovena Le for his PPM. He has seen Dr. Debara Pickett in the past, but mainly for varicose veins and has also seen Dr. Tamala Julian for CAD but not seen by general cardiology since 2014. He has been compliant however with EP clinic follow-ups for his PPM.  There is mention of CABG x 3 in the 1990s, however no recent cardiac catheterizations noted. He had an echocardiogram earlier this year 03/2017 that showed mildly reduced LVEF down at 40-45%. He has been doing well from a cardiac standpoint until recently.  He presented to the [facility] ED on 08/15/17 with complaint of dyspnea with dry productive cough. BNP in ED was elevated at 272.4. Initial CXR showed Mild pulmonary vascular congestion without overt edema. He was admitted by IM and started on IV Lasix. Admit weight was 187 lb. Scr was 1.22. Troponin level normal x 3. Repeat 2D echo obtained, which shows improved LVEF compared to previous study in May. EF now measured to be 55-60% (previously 40-45%) with hypokinesis of the inferior myocardium. Doppler parameters are c/w high ventricular filling pressure. Mild MR noted. LA mildly dilated. RV and RA mildly dilated. PA pressure mildly increased at 35 mm Hg.   Pt was given IV Lasix. However on repeat assessment today, it was noted by primary team that pt's lung exam sound worse today  despite diuresis. F/u CXR was repeated, showing mild bilateral interstitial prominence and cardiomegaly c/w mild CHF. Pt also tried walking with PT today and felt weak and had drop in O2 sats. Cardiology consulted for recommendations.   Based on I/Os, he has diuresed 3.75 L since admit. Weigh is down from 187 lb to 176 lb, however ? Accuracy. Slight bump in SCr with diuresis but still WNL at 1.22. BUN up to 23 today. K WNL at 3.7. BP is stable. He was initially given IV Lasix. Today he was given PO, 100 mg tablet x 1. He is also on BB therapy with atenolol. Pt has been prescribed metolazone but reports that he does not like to take given it makes him weak.   He is currently resting comfortably in bed with head of bed elevated. No resting dyspnea at current. He denies CP.   Past Medical History:  Diagnosis Date  . Arrhythmia    afib  . Arthritis    "back and hands" (09/26/2015)  . Basal cell carcinoma   . CHF (congestive heart failure) ([place])   . Coronary artery disease   . CVI (common variable immunodeficiency) ([place])   . DJD (degenerative joint disease)   . ED (erectile dysfunction)   . GERD (gastroesophageal reflux disease)   . Gout   . Heart attack ([place]) 1986  . Hyperlipidemia   . Hypertension   . IFG (impaired fasting glucose)   . Neuropathy   . Non Hodgkin's lymphoma ([place]) dx'd 2006  .  PAC (premature atrial contraction)   . PE (pulmonary embolism) 2007   "when I was taking chemo"  . Peptic ulcer disease   . Pneumonia ~ 2007 X 2  . Squamous carcinoma   . Venous stasis ulcers (HCC)     Past Surgical History:  Procedure Laterality Date  . BACK SURGERY    . CARDIAC CATHETERIZATION  1990  . CATARACT EXTRACTION W/ INTRAOCULAR LENS  IMPLANT, BILATERAL Bilateral 10/2006 ~ 11/2006  . CORONARY ANGIOPLASTY    . CORONARY ARTERY BYPASS GRAFT  1990   CABG X3  . ELBOW SURGERY Left    "related to gout"  . INSERT / REPLACE / REMOVE PACEMAKER  08/2010  . KNEE ARTHROSCOPY Left   .  LUMBAR DISC SURGERY     "had pinched nerve; had to make room for it"  . NASAL SINUS SURGERY  1980   "related to bacteria infection in sinus"  . SQUAMOUS CELL CARCINOMA EXCISION Right 09/12/2015   cheek  . TONSILLECTOMY  1972     Home Medications:  Prior to Admission medications   Medication Sig Start Date End Date Taking? Authorizing Provider  acetaminophen (TYLENOL) 500 MG tablet Take 500 mg by mouth at bedtime.    Yes [provider]  allopurinol (ZYLOPRIM) 300 MG tablet Take 300 mg by mouth daily.    Yes [provider]  ALPRAZolam Duanne Moron) 0.5 MG tablet Take 1 tablet (0.5 mg total) by mouth at bedtime as needed for anxiety. For sleep Patient taking differently: Take 0.5 mg by mouth at bedtime. For sleep 03/23/17  Yes Geradine Girt, DO  aspirin 81 MG tablet Take 81 mg by mouth every other day.    Yes [provider]  atenolol (TENORMIN) 50 MG tablet Take 50 mg by mouth daily.    Yes [provider]  Cholecalciferol 2000 UNITS CAPS Take 4,000 Units by mouth daily.   Yes [provider]  clotrimazole-betamethasone (LOTRISONE) cream Apply 1 application topically as directed. 08/11/17  Yes [provider]  colchicine 0.6 MG tablet Take 0.6 mg by mouth daily as needed (gout).    Yes [provider]  furosemide (LASIX) 80 MG tablet Take 80 mg by mouth daily. Or as directed   Yes [provider]  gabapentin (NEURONTIN) 100 MG capsule Take 100 mg by mouth 3 (three) times daily. Takes at 0900,1700,1900 daily   Yes [provider]  metolazone (ZAROXOLYN) 2.5 MG tablet Take 2.5 mg by mouth daily as needed (for pain).   Yes [provider]  nitroGLYCERIN (NITROSTAT) 0.4 MG SL tablet Place 0.4 mg under the tongue every 5 (five) minutes as needed for chest pain.   Yes [provider]  potassium chloride SA (K-DUR,KLOR-CON) 20 MEQ tablet Take 40 mEq by mouth 2 (two) times daily.    Yes [provider]  triamcinolone cream (KENALOG) 0.1 % Apply 1 application topically daily as needed for rash. 03/10/17  Yes [provider]  warfarin (COUMADIN) 2.5 MG tablet Take 2.5 mg by mouth daily at 6 PM. Or as directed   Yes [provider]  polyethylene glycol (MIRALAX / GLYCOLAX) packet Take 17 g by mouth daily as needed for mild constipation.    [provider]  sennosides-docusate sodium (SENOKOT-S) 8.6-50 MG tablet Take 2 tablets by mouth at bedtime.    [provider]    Inpatient Medications: Scheduled Meds: . allopurinol  300 mg Oral Daily  . aspirin EC  81 mg  Oral QODAY  . atenolol  50 mg Oral Daily  . loratadine  10 mg Oral Daily  . potassium chloride SA  40 mEq Oral BID  . senna-docusate  2 tablet Oral QHS  . sodium chloride flush  3 mL Intravenous Q12H  . warfarin  5 mg Oral ONCE-1800  . Warfarin - Pharmacist Dosing Inpatient   Does not apply q1800   Continuous Infusions: . sodium chloride     PRN Meds: sodium chloride, acetaminophen, albuterol, ALPRAZolam, hydrALAZINE, nitroGLYCERIN, ondansetron (ZOFRAN) IV, polyethylene glycol, sodium chloride flush  Allergies:    Allergies  Allergen Reactions  . Avelox [Moxifloxacin Hcl In Nacl] Other (See Comments)    Messed up lungs  . Indomethacin Other (See Comments)    Renal insufficiency  . Levaquin [Levofloxacin] Other (See Comments)    Messed up lungs  . Lipitor [Atorvastatin] Nausea Only, Palpitations and Other (See Comments)    Irregular heart beat also  . Moxifloxacin Other (See Comments)    Respiratory distress  . Pravachol [Pravastatin Sodium] Nausea Only, Palpitations and Other (See Comments)  . Aleve [Naproxen Sodium] Nausea And Vomiting and Other (See Comments)    GI Upset  . Norpace [Disopyramide Phosphate] Other (See Comments)    Urinary retention  . Disopyramide Other (See Comments)    Urinary retention  . Morphine Nausea Only    Irregular hear beat  . Pravastatin Nausea Only      Irregular heart beat  . Amlodipine Besylate Rash  . Azithromycin Rash  . Bactrim [Sulfamethoxazole-Trimethoprim] Rash  . Clinoril [Sulindac] Rash  . Codeine Other (See Comments)    unknown  . Digoxin Rash  . Lorazepam Other (See Comments)    unknown  . Morphine And Related Other (See Comments)    unknown  . Other Other (See Comments)    Tripilenomenon? Per paperwork  . Oxycodone-Acetaminophen Rash  . Percocet [Oxycodone-Acetaminophen] Rash  . Sulfamethoxazole-Trimethoprim Rash  . Sulfonamide Derivatives Rash  . Uloric [Febuxostat] Other (See Comments)    unknown    Social History:   Social History   Social History  . Marital status: Widowed    Spouse name: N/A  . Number of children: N/A  . Years of education: N/A   Occupational History  . Not on file.   Social History Main Topics  . Smoking status: Former Smoker    Packs/day: 1.00    Years: 1.00    Types: Cigarettes  . Smokeless tobacco: Never Used  . Alcohol use No  . Drug use: No  . Sexual activity: No   Other Topics Concern  . Not on file   Social History Narrative  . No narrative on file    Family History:    Family History  Problem Relation Age of Onset  . Obesity Mother   . Lung disease Father   . Diabetes type II Sister   . Heart disease Brother   . Diabetes type II Daughter      ROS:  Please see the history of present illness.  ROS  All other ROS reviewed and negative.     Physical Exam/Data:   Vitals:   08/17/17 0530 08/17/17 0937 08/17/17 1400 08/17/17 1421  BP: 124/61 140/63 114/72   Pulse: 88 84 70   Resp: 18 16 18    Temp: (!) 97.2 F (36.2 C) 98.6 F (37 C) 98.6 F (37 C)   TempSrc: Oral Oral Oral   SpO2: 96% 99% 94% (!) 80%  Weight: 176 lb 3.2  oz (79.9 kg)     Height:        Intake/Output Summary (Last 24 hours) at 08/17/17 1629 Last data filed at 08/17/17 1500  Gross per 24 hour  Intake              480 ml  Output             1550 ml  Net            -1070 ml    Filed Weights   08/15/17 1338 08/16/17 0636 08/17/17 0530  Weight: 203 lb 4.2 oz (92.2 kg) 176 lb 11.2 oz (80.2 kg) 176 lb 3.2 oz (79.9 kg)   Body mass index is 31.21 kg/m.  General:  Well nourished, well developed, in no acute distress, elderly  HEENT: normal Lymph: no adenopathy Neck: no JVD Endocrine:  No thryomegaly Vascular: No carotid bruits; FA pulses 2+ bilaterally without bruits  Cardiac:  Irregular rhythm, regular rate Lungs:  Faint expiratory wheezing at the bases bilaterally Abd: soft, nontender, no hepatomegaly  Ext: chronic venous stasis dermatitis with trace LEE on the right. The left LE is wrapped from chronic LE wound (follow[facility] at [facility]) Musculoskeletal:  No deformities, BUE and BLE strength normal and equal Skin: warm and dry  Neuro:  CNs 2-12 intact, no focal abnormalities noted Psych:  Normal affect   EKG:  The EKG was personally reviewed and demonstrates:  Vpaced 68 bpm Telemetry:  Telemetry was personally reviewed and demonstrates:  Paced rhythm   Relevant CV Studies: 2D Echo 08/16/17 Study Conclusions  - Left ventricle: The cavity size was normal. Wall thickness was   increased in a pattern of moderate LVH. Systolic function was   normal. The estimated ejection fraction was in the range of 55%   to 60%. There is hypokinesis of the inferior myocardium. The   study is not technically sufficient to allow evaluation of LV   diastolic function. Doppler parameters are consistent with high   ventricular filling pressure. - Mitral valve: Calcified annulus. There was mild regurgitation. - Left atrium: The atrium was mildly dilated. - Right ventricle: The cavity size was mildly dilated. - Right atrium: The atrium was mildly dilated. - Pulmonary arteries: Systolic pressure was mildly increased. PA   peak pressure: 35 mm Hg (S).  Impressions:  - Mild hypokinesis of the inferior wall with overall normal LV   function; moderate LVH; mild MR; mild LAE/RAE/RVE;  mild TR;   mildly elevated pulmonary pressure.  Laboratory Data:  Chemistry  Recent Labs Lab 08/15/17 0917 08/16/17 0459 08/17/17 0355  NA 138 137 139  K 4.0 3.7 3.7  CL 102 100* 98*  CO2 27 31 29   GLUCOSE 103* 97 98  BUN 19 17 23*  CREATININE 1.00 0.96 1.22  CALCIUM 9.8 9.3 9.5  GFRNONAA >60 >60 50*  GFRAA >60 >60 57*  ANIONGAP 9 6 12      Recent Labs Lab 08/15/17 0917  PROT 7.8  ALBUMIN 3.1*  AST 24  ALT 14*  ALKPHOS 69  BILITOT 0.8   Hematology  Recent Labs Lab 08/15/17 0917 08/17/17 0355  WBC 10.1 8.3  RBC 3.98* 4.28  HGB 13.1 13.8  HCT 40.6 43.4  MCV 102.0* 101.4*  MCH 32.9 32.2  MCHC 32.3 31.8  RDW 14.8 14.8  PLT 174 198   Cardiac Enzymes  Recent Labs Lab 08/15/17 0917 08/15/17 1332 08/15/17 2051  TROPONINI <0.03 <0.03 <0.03   No results for input(s): TROPIPOC in the last  168 hours.  BNP  Recent Labs Lab 08/15/17 0917  BNP 272.4*    DDimer No results for input(s): DDIMER in the last 168 hours.  Radiology/Studies:  Dg Chest 2 View  Result Date: 08/17/2017 CLINICAL DATA:  CHF.  Weakness. EXAM: CHEST  2 VIEW COMPARISON:  08/15/2017. FINDINGS: Cardiac pacer with lead tips in right atrium right ventricle. Prior CABG. Cardiomegaly with mild pulmonary interstitial prominence noted on today's exam. Findings are consistent with CHF. No pleural effusion or pneumothorax . Diffuse thoracic spine osteopenia degenerative change. Stable lower thoracic vertebral body compression fracture. IMPRESSION: 1. Cardiac pacer with lead tips in right atrium right ventricle. Prior CABG. 2. Cardiomegaly with mild bilateral interstitial prominence noted on today's exam. Findings are consistent with mild CHF . Electronically Signed   By: Marcello Moores  Register   On: 08/17/2017 09:36   Dg Chest Port 1 View  Result Date: 08/15/2017 CLINICAL DATA:  Shortness of breath and cough. EXAM: PORTABLE CHEST 1 VIEW COMPARISON:  Chest x-ray dated Mar 21, 2017. FINDINGS: Left chest  wall AICD in unchanged position. Stable cardiomegaly and postsurgical changes related to prior CABG. Mild pulmonary vascular congestion. Low lung volumes with bibasilar atelectasis. Unchanged rounded density in the right lower lung, most likely a nipple shadow. No focal consolidation, pleural effusion, or pneumothorax. No acute osseous abnormality. IMPRESSION: Mild pulmonary vascular congestion without overt edema. Electronically Signed   By: Titus Dubin M.D.   On: 08/15/2017 09:57    Assessment and Plan:   1. HFpEF: Pt presented with complaint of dyspnea and nonproductive cough with abnormal BNP at 274 and CXR consistent with mild CHF w/ vascular congestion. Echo shows preserved LVEF at 60-65% and elevated filling pressures. Pt initially treated with IV Lasix, however was noted to have worsening lung exam today with f/u CXR showing persistent fluid despite diuresis. Pt still with evidence of mild volume overload on exam. Renal function, electrolytes and BP stable. He would benefit from additional IV diuretics. He now has a foley. Can give an additional 60 mg IV lasix this evening. Reassess response in the am. Tract I/Os, daily weights and obtain f/u BMP in the am to recheck renal function and electrolytes. Low sodium diet. Continue BB therapy with atenolol. Monitor BP.   2. Chronic Afib: rate controlled. On coumadin for a/c.   3. Tachybrady Syndrome: has PPM, followed by Dr. Lovena Le.   4. CAD: h/o remote CABG in 1990. Stable w/o chest pain. Continue medical therapy.   5. HTN: controlled on current regimen. Monitor closely while diuresing given his advanced age.      MD to follow with further recs.   For questions or updates, please contact [place] Please consult [email or web address] for contact info under Cardiology/STEMI.   Signed, Lyda Jester, PA-C  08/17/2017 4:29 PM  Agree with note written by Ellen Henri  [facility]  We are asked to see this elderly frail 83 year old Caucasian  male who lives alone and is a patient of Drs. Tamala Julian and [employer]. He was admitted with increasing dyspnea on exertion and cough. He does have a history of ischemic heart disease status post bypass grafting 28 years ago. He has chronic A. fib rate controlled on Coumadin anticoagulation. He has tachybradycardia syndrome status post permanent transvenous pacemaker insertion. He is on furosemide 80 mg a day at home. He did have some lower extremity edema which has improved with diuresis. His oral dose has been increased to 100 mg of furosemide. His I/os -3 L. His renal function  is stable and his enzymes are negative. His EKG shows a paced rhythm. He does have some wheezing on exam. His neck Veins are flat. He does not appear to be wet on exam.  At this point, I am somewhat concerned that his shortness of breath may not all be attributable to diastolic heart failure. His 2-D echo shows normal LV function. His chest x-ray did show some interstitial edema which has not changed. His BNP was only minimally elevated at 272. His white count is normal and he is afebrile. I would continue to diurese him as you are doing. He may benefit from an inhaled bronchodilators. We will continue to follow him with you.   Quay Burow 08/17/2017 5:07 PM

## 2017-08-17 NOTE — Progress Notes (Addendum)
PROGRESS NOTE    Alexander MCMAHILL  QQP:[id number] DOB: [birth year] DOA: 08/15/2017 PCP: Lavone Orn, MD   Outpatient Specialists:     Brief Narrative:  [name] is a 83 y.o. male  with past medical history significant for atrial fibrillation, CHF, coronary disease, gout, myocardial infarction, non-Hodgkin's lymphoma presents to the hospital with chief complaint of dyspnea and weakness. Patient denies any sick contacts or recent medication changes. Has been dyspneic with a dry cough for about the last week. Over last 24 hours patient's cough became worse. Patient had trouble ambulating and standing. Patient's son brought patient to the hospital for evaluation.  ED course: X-ray done that shows basilar congestion. Patient given a DuoNeb. Some relief with nebulizer treatment and cough became productive. On standing patient was very unsteady, and almost toppled over. Hospitalist consulted for admission.  PATIENT NOT READY FOR DISCHARGE AS HIS LUNGS SOUND WORSE AND HIS X RAY SHOWS CONTINUED FLUID DESPITE DIURESES-- PLAN TO GET CARDIOLOGY CONSULT  Assessment & Plan:   Principal Problem:   Dyspnea Active Problems:   Atrial fibrillation (HCC)   Venous stasis ulcer (HCC)   Generalized weakness   Acute respiratory failure ([place])   Acute on chronic diastolic CHF exacerbation/Dyspnea Echo: Mild hypokinesis of the inferior wall with overall normal LV   function; moderate LVH; mild MR; mild LAE/RAE/RVE; mild TR;   mildly elevated pulmonary pressure. Given IV lasix x 2 doses and down 3.7L-- lungs sound worse today than yesterday -patient desaturated down to 80% with ambulation -weights do not appear accurate -will need increase home dose of lasix at d/c -patient does not like to take metolazone as it makes him weak -patient also does not weigh daily -x ray: shows continued CHF today -cardiology consult as more fluid needs to be removed in this 83 year old male  Weakness -lives  alone but refusing SNF placement -continue ambulation as patient prefers to live at home  Continued cough -improved with diuresis -add allergy med -PRN nebs  Gout Continue allopurinol  CAD Continue aspirin When necessary nitroglycerin  Atrial fibrillation Continue Tenormin Continue warfarin  Chronic pain Hold Neurontin  Constipation Continue Senokot and MiraLAX  Anxiety/insomnia When necessary Ativan  Venous stasis ulcer Wound care nurse consult -left leg UNNA boot, right leg with TED hose    DVT prophylaxis:   Fully anticoagulated   Code Status: Full Code   Family Communication: At bedside  Disposition Plan:     Consultants:        Subjective: Patient's breathing has worsened today- desatted down to 80% with ambulation   Objective: Vitals:   08/17/17 0530 08/17/17 0937 08/17/17 1400 08/17/17 1421  BP: 124/61 140/63 114/72   Pulse: 88 84 70   Resp: 18 16 18    Temp: (!) 97.2 F (36.2 C) 98.6 F (37 C) 98.6 F (37 C)   TempSrc: Oral Oral Oral   SpO2: 96% 99% 94% (!) 80%  Weight: 79.9 kg (176 lb 3.2 oz)     Height:        Intake/Output Summary (Last 24 hours) at 08/17/17 1509 Last data filed at 08/17/17 1500  Gross per 24 hour  Intake              480 ml  Output             1800 ml  Net            -1320 ml   Filed Weights   08/15/17 1338 08/16/17  1610 08/17/17 0530  Weight: 92.2 kg (203 lb 4.2 oz) 80.2 kg (176 lb 11.2 oz) 79.9 kg (176 lb 3.2 oz)    Examination:  General exam: increased work of breathing this PM Respiratory system: coarse breath sounds-- worsened from yesterday Cardiovascular system: both legs in [employer], +edema Gastrointestinal system: +BS, soft Central nervous system: Alert-- appears more fatigued today Extremities: moves all 4 ext Psychiatry: Judgement and insight appear normal. Mood & affect appropriate.     Data Reviewed: I have personally reviewed following labs and imaging  studies  CBC:  Recent Labs Lab 08/15/17 0917 08/17/17 0355  WBC 10.1 8.3  NEUTROABS 6.1  --   HGB 13.1 13.8  HCT 40.6 43.4  MCV 102.0* 101.4*  PLT 174 960   Basic Metabolic Panel:  Recent Labs Lab 08/15/17 0917 08/16/17 0459 08/17/17 0355  NA 138 137 139  K 4.0 3.7 3.7  CL 102 100* 98*  CO2 27 31 29   GLUCOSE 103* 97 98  BUN 19 17 23*  CREATININE 1.00 0.96 1.22  CALCIUM 9.8 9.3 9.5  MG 1.8  --   --    GFR: Estimated Creatinine Clearance: 36.1 mL/min (by C-G formula based on SCr of 1.22 mg/dL). Liver Function Tests:  Recent Labs Lab 08/15/17 0917  AST 24  ALT 14*  ALKPHOS 69  BILITOT 0.8  PROT 7.8  ALBUMIN 3.1*   No results for input(s): LIPASE, AMYLASE in the last 168 hours. No results for input(s): AMMONIA in the last 168 hours. Coagulation Profile:  Recent Labs Lab 08/15/17 1332 08/16/17 0459 08/17/17 0355  INR 1.70 1.81 1.87   Cardiac Enzymes:  Recent Labs Lab 08/15/17 0917 08/15/17 1332 08/15/17 2051  TROPONINI <0.03 <0.03 <0.03   BNP (last 3 results) No results for input(s): PROBNP in the last 8760 hours. HbA1C: No results for input(s): HGBA1C in the last 72 hours. CBG: No results for input(s): GLUCAP in the last 168 hours. Lipid Profile: No results for input(s): CHOL, HDL, LDLCALC, TRIG, CHOLHDL, LDLDIRECT in the last 72 hours. Thyroid Function Tests: No results for input(s): TSH, T4TOTAL, FREET4, T3FREE, THYROIDAB in the last 72 hours. Anemia Panel: No results for input(s): VITAMINB12, FOLATE, FERRITIN, TIBC, IRON, RETICCTPCT in the last 72 hours. Urine analysis:    Component Value Date/Time   COLORURINE YELLOW 08/15/2017 1000   APPEARANCEUR CLEAR 08/15/2017 1000   LABSPEC 1.013 08/15/2017 1000   PHURINE 6.0 08/15/2017 1000   GLUCOSEU NEGATIVE 08/15/2017 1000   HGBUR NEGATIVE 08/15/2017 1000   BILIRUBINUR NEGATIVE 08/15/2017 1000   KETONESUR NEGATIVE 08/15/2017 1000   PROTEINUR NEGATIVE 08/15/2017 1000   UROBILINOGEN 0.2  07/08/2015 0428   NITRITE NEGATIVE 08/15/2017 1000   LEUKOCYTESUR NEGATIVE 08/15/2017 1000     Recent Results (from the past 240 hour(s))  Urine Culture     Status: Abnormal   Collection Time: 08/15/17 10:00 AM  Result Value Ref Range Status   Specimen Description URINE, CLEAN CATCH  Final   Special Requests NONE  Final   Culture MULTIPLE SPECIES PRESENT, SUGGEST RECOLLECTION (A)  Final   Report Status 08/16/2017 FINAL  Final      Anti-infectives    None       Radiology Studies: Dg Chest 2 View  Result Date: 08/17/2017 CLINICAL DATA:  CHF.  Weakness. EXAM: CHEST  2 VIEW COMPARISON:  08/15/2017. FINDINGS: Cardiac pacer with lead tips in right atrium right ventricle. Prior CABG. Cardiomegaly with mild pulmonary interstitial prominence noted on today's exam. Findings  are consistent with CHF. No pleural effusion or pneumothorax . Diffuse thoracic spine osteopenia degenerative change. Stable lower thoracic vertebral body compression fracture. IMPRESSION: 1. Cardiac pacer with lead tips in right atrium right ventricle. Prior CABG. 2. Cardiomegaly with mild bilateral interstitial prominence noted on today's exam. Findings are consistent with mild CHF . Electronically Signed   By: Marcello Moores  Register   On: 08/17/2017 09:36        Scheduled Meds: . allopurinol  300 mg Oral Daily  . aspirin EC  81 mg Oral QODAY  . atenolol  50 mg Oral Daily  . furosemide  60 mg Intravenous Once  . loratadine  10 mg Oral Daily  . potassium chloride SA  40 mEq Oral BID  . senna-docusate  2 tablet Oral QHS  . sodium chloride flush  3 mL Intravenous Q12H  . warfarin  5 mg Oral ONCE-1800  . Warfarin - Pharmacist Dosing Inpatient   Does not apply q1800   Continuous Infusions: . sodium chloride       LOS: 0 days    Time spent: 35 min    [place], DO [facility] Pager [phone]  If 7PM-7AM, please contact night-coverage [email or web address] Password [email or web address] 08/17/2017, 3:09 PM

## 2017-08-17 NOTE — Progress Notes (Signed)
Physical Therapy Treatment Patient Details Name: [name] MRN: [id number] DOB: [birth year] Today's Date: 08/17/2017    History of Present Illness 83 y.o. male  with past medical history significant for atrial fibrillation, CHF, coronary disease, gout, myocardial infarction, non-Hodgkin's lymphoma presents to the hospital with chief complaint of dyspnea and weakness. Dx exacerbation of CHF with associated weakness    PT Comments    Patient continues to progress toward mobility goals. Pt overall required min guard for safety with OOB mobility. VSS. Pt likely would have ambulated farther but transport needed pt for Xray. Current plan remains appropriate.    Follow Up Recommendations  Home health PT;Supervision - Intermittent     Equipment Recommendations  None recommended by PT    Recommendations for Other Services       Precautions / Restrictions Precautions Precautions: None Restrictions Weight Bearing Restrictions: No    Mobility  Bed Mobility               General bed mobility comments: pt OOB in chair upon arrival  Transfers Overall transfer level: Needs assistance Equipment used: Rolling walker (2 wheeled) Transfers: Sit to/from Stand Sit to Stand: Min guard         General transfer comment: min guard for safety; increased time and effort needed  Ambulation/Gait Ambulation/Gait assistance: Min guard Ambulation Distance (Feet): 150 Feet Assistive device: Rolling walker (2 wheeled) Gait Pattern/deviations: Step-through pattern;Decreased step length - right;Decreased step length - left;Trunk flexed Gait velocity: decreased   General Gait Details: cues for posture and increased bilat step lengths and cadence; pt with slow, steady gait   Stairs            Wheelchair Mobility    Modified Rankin (Stroke Patients Only)       Balance Overall balance assessment: Needs assistance Sitting-balance support: Feet supported;No upper extremity  supported Sitting balance-Leahy Scale: Good     Standing balance support: Bilateral upper extremity supported Standing balance-Leahy Scale: Poor                              Cognition Arousal/Alertness: Awake/alert Behavior During Therapy: WFL for tasks assessed/performed Overall Cognitive Status: Within Functional Limits for tasks assessed                                        Exercises      General Comments General comments (skin integrity, edema, etc.): VSS      Pertinent Vitals/Pain Pain Assessment: Faces Faces Pain Scale: Hurts a little bit Pain Location: bilat LE Pain Descriptors / Indicators: Discomfort;Sore Pain Intervention(s): Limited activity within patient's tolerance;Monitored during session;Repositioned    Home Living                      Prior Function            PT Goals (current goals can now be found in the care plan section) Acute Rehab PT Goals Patient Stated Goal: stay in his home as long as possible PT Goal Formulation: With patient Time For Goal Achievement: 08/30/17 Potential to Achieve Goals: Fair Progress towards PT goals: Progressing toward goals    Frequency    Min 3X/week      PT Plan Current plan remains appropriate    Co-evaluation  AM-PAC PT "6 Clicks" Daily Activity  Outcome Measure  Difficulty turning over in bed (including adjusting bedclothes, sheets and blankets)?: A Little Difficulty moving from lying on back to sitting on the side of the bed? : A Little Difficulty sitting down on and standing up from a chair with arms (e.g., wheelchair, bedside commode, etc,.)?: A Little Help needed moving to and from a bed to chair (including a wheelchair)?: A Little Help needed walking in hospital room?: A Little Help needed climbing 3-5 steps with a railing? : A Lot 6 Click Score: 17    End of Session Equipment Utilized During Treatment: Gait belt Activity Tolerance:  Patient tolerated treatment well Patient left: in chair;with call bell/phone within reach Nurse Communication: Mobility status PT Visit Diagnosis: Unsteadiness on feet (R26.81);Other abnormalities of gait and mobility (R26.89);Muscle weakness (generalized) (M62.81);Difficulty in walking, not elsewhere classified (R26.2)     Time: 5003-7048 PT Time Calculation (min) (ACUTE ONLY): 25 min  Charges:  $Gait Training: 8-22 mins $Therapeutic Activity: 8-22 mins                    G Codes:       Earney Navy, PTA Pager: [phone]     Darliss Cheney 08/17/2017, 11:22 AM

## 2017-08-17 NOTE — Progress Notes (Signed)
Ambulate patient in the hall O2Sats dropped 80%, patient verbalized he feels weak and tired. Both lungs exp wheezing and crackles. Placed on bed O2 Sats went up to 94%. MD paged made aware.

## 2017-08-18 DIAGNOSIS — R0602 Shortness of breath: Secondary | ICD-10-CM

## 2017-08-18 DIAGNOSIS — J9801 Acute bronchospasm: Secondary | ICD-10-CM

## 2017-08-18 DIAGNOSIS — R0600 Dyspnea, unspecified: Secondary | ICD-10-CM | POA: Diagnosis not present

## 2017-08-18 DIAGNOSIS — J9601 Acute respiratory failure with hypoxia: Secondary | ICD-10-CM | POA: Diagnosis not present

## 2017-08-18 DIAGNOSIS — I5033 Acute on chronic diastolic (congestive) heart failure: Secondary | ICD-10-CM | POA: Diagnosis not present

## 2017-08-18 DIAGNOSIS — I482 Chronic atrial fibrillation: Secondary | ICD-10-CM | POA: Diagnosis not present

## 2017-08-18 DIAGNOSIS — I2510 Atherosclerotic heart disease of native coronary artery without angina pectoris: Secondary | ICD-10-CM | POA: Diagnosis not present

## 2017-08-18 DIAGNOSIS — I509 Heart failure, unspecified: Secondary | ICD-10-CM

## 2017-08-18 DIAGNOSIS — R531 Weakness: Secondary | ICD-10-CM | POA: Diagnosis not present

## 2017-08-18 LAB — BASIC METABOLIC PANEL WITH GFR
Anion gap: 10 (ref 5–15)
BUN: 25 mg/dL — ABNORMAL HIGH (ref 6–20)
CO2: 29 mmol/L (ref 22–32)
Calcium: 9.4 mg/dL (ref 8.9–10.3)
Chloride: 97 mmol/L — ABNORMAL LOW (ref 101–111)
Creatinine, Ser: 1.23 mg/dL (ref 0.61–1.24)
GFR calc Af Amer: 57 mL/min — ABNORMAL LOW
GFR calc non Af Amer: 49 mL/min — ABNORMAL LOW
Glucose, Bld: 101 mg/dL — ABNORMAL HIGH (ref 65–99)
Potassium: 3.8 mmol/L (ref 3.5–5.1)
Sodium: 136 mmol/L (ref 135–145)

## 2017-08-18 LAB — PROTIME-INR
INR: 2.52
Prothrombin Time: 27 seconds — ABNORMAL HIGH (ref 11.4–15.2)

## 2017-08-18 MED ORDER — WARFARIN SODIUM 2.5 MG PO TABS: 2.5000 mg | ORAL_TABLET | Freq: Once | ORAL | Status: DC

## 2017-08-18 MED ORDER — FUROSEMIDE 40 MG PO TABS: 40.0000 mg | ORAL_TABLET | Freq: Every day | ORAL | Status: DC

## 2017-08-18 MED ORDER — FUROSEMIDE 80 MG PO TABS
80.0000 mg | ORAL_TABLET | Freq: Every day | ORAL | Status: DC
  Administered 2017-08-18: 80 mg via ORAL
  Filled 2017-08-18: qty 1

## 2017-08-18 MED ORDER — ALBUTEROL SULFATE (2.5 MG/3ML) 0.083% IN NEBU: 2.5000 mg | INHALATION_SOLUTION | RESPIRATORY_TRACT | 12 refills | Status: DC | PRN

## 2017-08-18 MED ORDER — FUROSEMIDE 40 MG PO TABS: ORAL_TABLET | ORAL | 0 refills | Status: DC

## 2017-08-18 MED ORDER — LORATADINE 10 MG PO TABS: 10.0000 mg | ORAL_TABLET | Freq: Every day | ORAL | 0 refills | Status: DC

## 2017-08-18 NOTE — Progress Notes (Signed)
CM talked to patient and family [name] - son and his wife); patient is arranged with [place] for [facility] services and referral made to [facility] for Outpatient Palliative Care services. Nebulizer to be delivered to the room today prior to discharging home; Aneta Mins [phone]

## 2017-08-18 NOTE — Progress Notes (Signed)
Progress Note  Patient Name: [name] Date of Encounter: 08/18/2017  Primary Cardiologist: Tamala Julian  Subjective   Feeling well this morning.   Inpatient Medications    Scheduled Meds: . allopurinol  300 mg Oral Daily  . aspirin EC  81 mg Oral QODAY  . atenolol  50 mg Oral Daily  . furosemide  40 mg Oral QHS  . furosemide  80 mg Oral Daily  . loratadine  10 mg Oral Daily  . potassium chloride SA  40 mEq Oral BID  . senna-docusate  2 tablet Oral QHS  . sodium chloride flush  3 mL Intravenous Q12H  . warfarin  2.5 mg Oral ONCE-1800  . Warfarin - Pharmacist Dosing Inpatient   Does not apply q1800   Continuous Infusions: . sodium chloride     PRN Meds: sodium chloride, acetaminophen, albuterol, ALPRAZolam, hydrALAZINE, nitroGLYCERIN, ondansetron (ZOFRAN) IV, polyethylene glycol, sodium chloride flush   Vital Signs    Vitals:   08/17/17 1400 08/17/17 1421 08/17/17 1941 08/18/17 0441  BP: 114/72  (!) 135/59 127/63  Pulse: 70  66 64  Resp: 18  18 18   Temp: 98.6 F (37 C)  98 F (36.7 C) 98 F (36.7 C)  TempSrc: Oral  Oral Oral  SpO2: 94% (!) 80% 96% 98%  Weight:    176 lb 3.2 oz (79.9 kg)  Height:        Intake/Output Summary (Last 24 hours) at 08/18/17 1122 Last data filed at 08/18/17 0300  Gross per 24 hour  Intake              840 ml  Output             1650 ml  Net             -810 ml   Filed Weights   08/16/17 0636 08/17/17 0530 08/18/17 0441  Weight: 176 lb 11.2 oz (80.2 kg) 176 lb 3.2 oz (79.9 kg) 176 lb 3.2 oz (79.9 kg)    Telemetry    vpaced - Personally Reviewed  Physical Exam   General: Pleasant older W male appearing in no acute distress. Head: Normocephalic, atraumatic.  Neck: Supple without bruits, JVD. Lungs:  Resp regular and unlabored, Rhonchi. Heart: Irreg Irreg, S1, S2, no S3, S4, or murmur; no rub. Abdomen: Soft, non-tender, non-distended with normoactive bowel sounds. No hepatomegaly. No rebound/guarding. No obvious  abdominal masses. Extremities: No clubbing, cyanosis, edema. Distal pedal pulses are 2+ bilaterally. Neuro: Alert and oriented X 3. Moves all extremities spontaneously. Psych: Normal affect.  Labs    Chemistry Recent Labs Lab 08/15/17 [phone] 08/16/17 0459 08/17/17 0355 08/18/17 0546  NA 138 137 139 136  K 4.0 3.7 3.7 3.8  CL 102 100* 98* 97*  CO2 27 31 29 29   GLUCOSE 103* 97 98 101*  BUN 19 17 23* 25*  CREATININE 1.00 0.96 1.22 1.23  CALCIUM 9.8 9.3 9.5 9.4  PROT 7.8  --   --   --   ALBUMIN 3.1*  --   --   --   AST 24  --   --   --   ALT 14*  --   --   --   ALKPHOS 69  --   --   --   BILITOT 0.8  --   --   --   GFRNONAA >60 >60 50* 49*  GFRAA >60 >60 57* 57*  ANIONGAP 9 6 12 10      Hematology Recent  Labs Lab 08/15/17 0917 08/17/17 0355  WBC 10.1 8.3  RBC 3.98* 4.28  HGB 13.1 13.8  HCT 40.6 43.4  MCV 102.0* 101.4*  MCH 32.9 32.2  MCHC 32.3 31.8  RDW 14.8 14.8  PLT 174 198    Cardiac Enzymes Recent Labs Lab 08/15/17 0917 08/15/17 1332 08/15/17 2051  TROPONINI <0.03 <0.03 <0.03   No results for input(s): TROPIPOC in the last 168 hours.   BNP Recent Labs Lab 08/15/17 0917  BNP 272.4*     DDimer No results for input(s): DDIMER in the last 168 hours.    Radiology    Dg Chest 2 View  Result Date: 08/17/2017 CLINICAL DATA:  CHF.  Weakness. EXAM: CHEST  2 VIEW COMPARISON:  08/15/2017. FINDINGS: Cardiac pacer with lead tips in right atrium right ventricle. Prior CABG. Cardiomegaly with mild pulmonary interstitial prominence noted on today's exam. Findings are consistent with CHF. No pleural effusion or pneumothorax . Diffuse thoracic spine osteopenia degenerative change. Stable lower thoracic vertebral body compression fracture. IMPRESSION: 1. Cardiac pacer with lead tips in right atrium right ventricle. Prior CABG. 2. Cardiomegaly with mild bilateral interstitial prominence noted on today's exam. Findings are consistent with mild CHF . Electronically  Signed   By: Marcello Moores  Register   On: 08/17/2017 09:36    Cardiac Studies   TTE: 08/16/17  Study Conclusions  - Left ventricle: The cavity size was normal. Wall thickness was   increased in a pattern of moderate LVH. Systolic function was   normal. The estimated ejection fraction was in the range of 55%   to 60%. There is hypokinesis of the inferior myocardium. The   study is not technically sufficient to allow evaluation of LV   diastolic function. Doppler parameters are consistent with high   ventricular filling pressure. - Mitral valve: Calcified annulus. There was mild regurgitation. - Left atrium: The atrium was mildly dilated. - Right ventricle: The cavity size was mildly dilated. - Right atrium: The atrium was mildly dilated. - Pulmonary arteries: Systolic pressure was mildly increased. PA   peak pressure: 35 mm Hg (S).  Impressions:  - Mild hypokinesis of the inferior wall with overall normal LV   function; moderate LVH; mild MR; mild LAE/RAE/RVE; mild TR;   mildly elevated pulmonary pressure.  Patient Profile     83 y.o. male with a hx of chronic afib, tachybrady syndrome s/p PPM, h/o systolic LV dysfunction (EF back to normal), HTN, h/o CAD and CABG in the 1990s and h/o non-Hodgkin's lymphoma, who presented with HF.   Assessment & Plan    1. HFpEF: Pt presented with complaint of dyspnea and nonproductive cough with abnormal BNP at 274 and CXR consistent with mild CHF w/ vascular congestion. Echo showed preserved LVEF at 60-65% and elevated filling pressures. Renal function, electrolytes and BP stable. Low sodium diet. Continue BB therapy with atenolol. Given IV lasix with good response. Plan to resume oral lasix today with 80mg  in the am and 40mg  in the pm.   2. Chronic Afib: rate controlled. On coumadin for a/c.   3. Tachybrady Syndrome: has PPM, followed by Dr. Lovena Le.   4. CAD: h/o remote CABG in 1990. Stable w/o chest pain. Continue medical therapy.   5.  HTN: controlled on current regimen. Monitor closely while diuresing given his advanced age.    - will arrange for outpatient follow up.    Signed, Reino Bellis, NP  08/18/2017, 11:22 AM  Pager # [phone]   For questions or updates,  please contact [place] Please consult [email or web address] for contact info under Cardiology/STEMI. Daytime calls, contact the Day Call APP (6a-8a) or assigned team (Teams A-D) provider (7:30a - 5p). All other daytime calls (7:30-5p), contact the Card Master @ [phone].   Nighttime calls, contact the assigned APP (5p-8p) or MD (6:30p-8p). Overnight calls (8p-6a), contact the on call Fellow @ [phone].

## 2017-08-18 NOTE — Progress Notes (Signed)
Orthopedic Tech Progress Note Patient Details:  [name] 21-Dec-1922 [id number]  Ortho Devices Type of Ortho Device: Louretta Parma boot Ortho Device/Splint Location: lle Ortho Device/Splint Interventions: Application   Torre Schaumburg 08/18/2017, 3:31 PM

## 2017-08-18 NOTE — Progress Notes (Signed)
Physical Therapy Treatment Patient Details Name: [name] MRN: [id number] DOB: 1923-03-03 Today's Date: 08/18/2017    History of Present Illness 83 y.o. male  with past medical history significant for atrial fibrillation, CHF, coronary disease, gout, myocardial infarction, non-Hodgkin's lymphoma presents to the hospital with chief complaint of dyspnea and weakness. Dx exacerbation of CHF with associated weakness    PT Comments    Patient tolerated increased gait distance and able to perform functional tasks in static standing without UE support. Pt required min guard for functional transfers and ambulation and min A to sit on EOB from supine. Pt sleeps in lift chair at home. Current plan remains appropriate.    Follow Up Recommendations  Home health PT;Supervision - Intermittent     Equipment Recommendations  None recommended by PT    Recommendations for Other Services       Precautions / Restrictions Precautions Precautions: None Restrictions Weight Bearing Restrictions: No    Mobility  Bed Mobility Overal bed mobility: Needs Assistance Bed Mobility: Rolling;Sidelying to Sit Rolling: Min guard Sidelying to sit: Min guard;Min assist       General bed mobility comments: use of rail and HOB elevated; assist to scoot hips toward EOB with use of bed pad  Transfers Overall transfer level: Needs assistance Equipment used: Rolling walker (2 wheeled) Transfers: Sit to/from Stand Sit to Stand: Min guard         General transfer comment: min guard for safety; increased time and effort needed  Ambulation/Gait Ambulation/Gait assistance: Min guard Ambulation Distance (Feet): 180 Feet Assistive device: Rolling walker (2 wheeled) Gait Pattern/deviations: Step-through pattern;Decreased step length - right;Decreased step length - left;Trunk flexed;Wide base of support Gait velocity: decreased   General Gait Details: cues for posture, proximity of RW, and increased  bilat step lengths   Stairs            Wheelchair Mobility    Modified Rankin (Stroke Patients Only)       Balance Overall balance assessment: Needs assistance Sitting-balance support: Feet supported;No upper extremity supported Sitting balance-Leahy Scale: Good     Standing balance support: No upper extremity supported (static standing) Standing balance-Leahy Scale: Fair Standing balance comment: pt was able to perform hand hygiene and brush teeth at sink without UE support                            Cognition Arousal/Alertness: Awake/alert Behavior During Therapy: WFL for tasks assessed/performed Overall Cognitive Status: Within Functional Limits for tasks assessed                                        Exercises      General Comments        Pertinent Vitals/Pain Pain Assessment: Faces Faces Pain Scale: Hurts little more Pain Location: bilat LE Pain Descriptors / Indicators: Discomfort;Sore Pain Intervention(s): Limited activity within patient's tolerance;Monitored during session;Repositioned    Home Living                      Prior Function            PT Goals (current goals can now be found in the care plan section) Acute Rehab PT Goals Patient Stated Goal: stay in his home as long as possible PT Goal Formulation: With patient Time For Goal Achievement: 08/30/17 Potential to  Achieve Goals: Fair Progress towards PT goals: Progressing toward goals    Frequency    Min 3X/week      PT Plan Current plan remains appropriate    Co-evaluation              AM-PAC PT "6 Clicks" Daily Activity  Outcome Measure  Difficulty turning over in bed (including adjusting bedclothes, sheets and blankets)?: A Lot Difficulty moving from lying on back to sitting on the side of the bed? : Unable Difficulty sitting down on and standing up from a chair with arms (e.g., wheelchair, bedside commode, etc,.)?: A Lot Help  needed moving to and from a bed to chair (including a wheelchair)?: A Little Help needed walking in hospital room?: A Little Help needed climbing 3-5 steps with a railing? : A Lot 6 Click Score: 13    End of Session Equipment Utilized During Treatment: Gait belt Activity Tolerance: Patient tolerated treatment well Patient left: in chair;with call bell/phone within reach;with chair alarm set Nurse Communication: Mobility status PT Visit Diagnosis: Unsteadiness on feet (R26.81);Other abnormalities of gait and mobility (R26.89);Muscle weakness (generalized) (M62.81);Difficulty in walking, not elsewhere classified (R26.2)     Time: 4540-9811 PT Time Calculation (min) (ACUTE ONLY): 40 min  Charges:  $Gait Training: 23-37 mins $Therapeutic Activity: 8-22 mins                    G Codes:       Earney Navy, PTA Pager: [phone]     Darliss Cheney 08/18/2017, 12:14 PM

## 2017-08-18 NOTE — Discharge Summary (Signed)
Physician Discharge Summary  [name] IRW:[id number] DOB: 28-Aug-1923 DOA: 08/15/2017  PCP: Lavone Orn, MD  Admit date: 08/15/2017 Discharge date: 08/18/2017   Recommendations for Outpatient Follow-Up:   Palliative care referral made Home health Not requiring home O2 Increased lasix to 80mg  q am and 40 mg q PM D/c weight: 176   Discharge Diagnosis:   Principal Problem:   Dyspnea Active Problems:   Atrial fibrillation (HCC)   Venous stasis ulcer ([place])   Generalized weakness   Acute respiratory failure [facility])   Discharge disposition:  Home  Discharge Condition: Improved.  Diet recommendation: Low sodium, heart healthy  Wound care: None.   History of Present Illness:   [name] is a 83 y.o. male  with past medical history significant for atrial fibrillation, CHF, coronary disease, gout, myocardial infarction, non-Hodgkin's lymphoma presents to the hospital with chief complaint of dyspnea and weakness. Patient denies any sick contacts or recent medication changes. Has been dyspneic with a dry cough for about the last week. Over last 24 hours patient's cough became worse. Patient had trouble ambulating and standing. Patient's son brought patient to the hospital for evaluation.  ED course: X-ray done that shows basilar congestion. Patient given a DuoNeb. Some relief with nebulizer treatment and cough became productive. On standing patient was very unsteady animals toppled over. Hospitalist consulted for admission.   Hospital Course by Problem:   Acute on chronic diastolic CHF exacerbation/Dyspnea Echo: Mild hypokinesis of the inferior wall with overall normal LV function; moderate LVH; mild MR; mild LAE/RAE/RVE; mild TR; mildly elevated pulmonary pressure. Given IV lasix x 2 doses and down 4.5L -weights do not appear accurate -will need increase home dose of lasix at d/c -patient does not like to take metolazone as it makes him weak -patient also  does not weigh daily- encouraged him to do this -home health - maxed out -appreciate cardiology  Weakness -lives alone but refusing SNF placement -continue ambulation as patient prefers to live at home  Cough-- ? Reactive airway -improved with diuresis -added allergy med -PRN nebs  Gout Continue allopurinol  CAD Continue aspirin When necessary nitroglycerin  Atrial fibrillation Continue Tenormin Continue warfarin  Chronic pain Hold Neurontin  Constipation Continue Senokot and MiraLAX  Anxiety/insomnia When necessary Ativan  Venous stasis ulcer Home health RN -left leg UNNA boot, right leg with TED hose    Medical Consultants:    cards   Discharge Exam:   Vitals:   08/17/17 1941 08/18/17 0441  BP: (!) 135/59 127/63  Pulse: 66 64  Resp: 18 18  Temp: 98 F (36.7 C) 98 F (36.7 C)  SpO2: 96% 98%   Vitals:   08/17/17 1400 08/17/17 1421 08/17/17 1941 08/18/17 0441  BP: 114/72  (!) 135/59 127/63  Pulse: 70  66 64  Resp: 18  18 18   Temp: 98.6 F (37 C)  98 F (36.7 C) 98 F (36.7 C)  TempSrc: Oral  Oral Oral  SpO2: 94% (!) 80% 96% 98%  Weight:    79.9 kg (176 lb 3.2 oz)  Height:        Gen:  NAD    The results of significant diagnostics from this hospitalization (including imaging, microbiology, ancillary and laboratory) are listed below for reference.     Procedures and Diagnostic Studies:   Dg Chest Port 1 View  Result Date: 08/15/2017 CLINICAL DATA:  Shortness of breath and cough. EXAM: PORTABLE CHEST 1 VIEW COMPARISON:  Chest x-ray dated Mar 21, 2017. FINDINGS: Left chest wall AICD in unchanged position. Stable cardiomegaly and postsurgical changes related to prior CABG. Mild pulmonary vascular congestion. Low lung volumes with bibasilar atelectasis. Unchanged rounded density in the right lower lung, most likely a nipple shadow. No focal consolidation, pleural effusion, or pneumothorax. No acute osseous abnormality. IMPRESSION:  Mild pulmonary vascular congestion without overt edema. Electronically Signed   By: Titus Dubin M.D.   On: 08/15/2017 09:57     Labs:   Basic Metabolic Panel:  Recent Labs Lab 08/15/17 0917 08/16/17 0459 08/17/17 0355 08/18/17 0546  NA 138 137 139 136  K 4.0 3.7 3.7 3.8  CL 102 100* 98* 97*  CO2 27 31 29 29   GLUCOSE 103* 97 98 101*  BUN 19 17 23* 25*  CREATININE 1.00 0.96 1.22 1.23  CALCIUM 9.8 9.3 9.5 9.4  MG 1.8  --   --   --    GFR Estimated Creatinine Clearance: 35.8 mL/min (by C-G formula based on SCr of 1.23 mg/dL). Liver Function Tests:  Recent Labs Lab 08/15/17 0917  AST 24  ALT 14*  ALKPHOS 69  BILITOT 0.8  PROT 7.8  ALBUMIN 3.1*   No results for input(s): LIPASE, AMYLASE in the last 168 hours. No results for input(s): AMMONIA in the last 168 hours. Coagulation profile  Recent Labs Lab 08/15/17 1332 08/16/17 0459 08/17/17 0355 08/18/17 0546  INR 1.70 1.81 1.87 2.52    CBC:  Recent Labs Lab 08/15/17 0917 08/17/17 0355  WBC 10.1 8.3  NEUTROABS 6.1  --   HGB 13.1 13.8  HCT 40.6 43.4  MCV 102.0* 101.4*  PLT 174 198   Cardiac Enzymes:  Recent Labs Lab 08/15/17 0917 08/15/17 1332 08/15/17 2051  TROPONINI <0.03 <0.03 <0.03   BNP: Invalid input(s): POCBNP CBG: No results for input(s): GLUCAP in the last 168 hours. D-Dimer No results for input(s): DDIMER in the last 72 hours. Hgb A1c No results for input(s): HGBA1C in the last 72 hours. Lipid Profile No results for input(s): CHOL, HDL, LDLCALC, TRIG, CHOLHDL, LDLDIRECT in the last 72 hours. Thyroid function studies No results for input(s): TSH, T4TOTAL, T3FREE, THYROIDAB in the last 72 hours.  Invalid input(s): FREET3 Anemia work up No results for input(s): VITAMINB12, FOLATE, FERRITIN, TIBC, IRON, RETICCTPCT in the last 72 hours. Microbiology Recent Results (from the past 240 hour(s))  Urine Culture     Status: Abnormal   Collection Time: 08/15/17 10:00 AM  Result Value  Ref Range Status   Specimen Description URINE, CLEAN CATCH  Final   Special Requests NONE  Final   Culture MULTIPLE SPECIES PRESENT, SUGGEST RECOLLECTION (A)  Final   Report Status 08/16/2017 FINAL  Final     Discharge Instructions:   Discharge Instructions    (HEART FAILURE PATIENTS) Call MD:  Anytime you have any of the following symptoms: 1) 3 pound weight gain in 24 hours or 5 pounds in 1 week 2) shortness of breath, with or without a dry hacking cough 3) swelling in the hands, feet or stomach 4) if you have to sleep on extra pillows at night in order to breathe.    Complete by:  As directed    Diet - low sodium heart healthy    Complete by:  As directed    Discharge instructions    Complete by:  As directed    Maximized home health BMP/INR next week   Increase activity slowly    Complete by:  As directed  Allergies as of 08/18/2017      Reactions   Avelox [moxifloxacin Hcl In Nacl] Other (See Comments)   Messed up lungs   Indomethacin Other (See Comments)   Renal insufficiency   Levaquin [levofloxacin] Other (See Comments)   Messed up lungs   Lipitor [atorvastatin] Nausea Only, Palpitations, Other (See Comments)   Irregular heart beat also   Moxifloxacin Other (See Comments)   Respiratory distress   Pravachol [pravastatin Sodium] Nausea Only, Palpitations, Other (See Comments)   Aleve [naproxen Sodium] Nausea And Vomiting, Other (See Comments)   GI Upset   Norpace [disopyramide Phosphate] Other (See Comments)   Urinary retention   Disopyramide Other (See Comments)   Urinary retention   Morphine Nausea Only   Irregular hear beat   Pravastatin Nausea Only   Irregular heart beat   Amlodipine Besylate Rash   Azithromycin Rash   Bactrim [sulfamethoxazole-trimethoprim] Rash   Clinoril [sulindac] Rash   Codeine Other (See Comments)   unknown   Digoxin Rash   Lorazepam Other (See Comments)   unknown   Morphine And Related Other (See Comments)   unknown    Other Other (See Comments)   Tripilenomenon? Per paperwork   Oxycodone-acetaminophen Rash   Percocet [oxycodone-acetaminophen] Rash   Sulfamethoxazole-trimethoprim Rash   Sulfonamide Derivatives Rash   Uloric [febuxostat] Other (See Comments)   unknown      Medication List    STOP taking these medications   metolazone 2.5 MG tablet Commonly known as:  ZAROXOLYN     TAKE these medications   acetaminophen 500 MG tablet Commonly known as:  TYLENOL Take 500 mg by mouth at bedtime.   albuterol (2.5 MG/3ML) 0.083% nebulizer solution Commonly known as:  PROVENTIL Take 3 mLs (2.5 mg total) by nebulization every 4 (four) hours as needed for wheezing or shortness of breath.   allopurinol 300 MG tablet Commonly known as:  ZYLOPRIM Take 300 mg by mouth daily.   ALPRAZolam 0.5 MG tablet Commonly known as:  XANAX Take 1 tablet (0.5 mg total) by mouth at bedtime as needed for anxiety. For sleep What changed:  when to take this  additional instructions   aspirin 81 MG tablet Take 81 mg by mouth every other day.   atenolol 50 MG tablet Commonly known as:  TENORMIN Take 50 mg by mouth daily.   Cholecalciferol 2000 units Caps Take 4,000 Units by mouth daily.   clotrimazole-betamethasone cream Commonly known as:  LOTRISONE Apply 1 application topically as directed.   colchicine 0.6 MG tablet Take 0.6 mg by mouth daily as needed (gout).   furosemide 40 MG tablet Commonly known as:  LASIX 80 mg every AM and 40 mg every PM What changed:  medication strength  how much to take  how to take this  when to take this  additional instructions   gabapentin 100 MG capsule Commonly known as:  NEURONTIN Take 100 mg by mouth 3 (three) times daily. Takes at 0900,1700,1900 daily   loratadine 10 MG tablet Commonly known as:  CLARITIN Take 1 tablet (10 mg total) by mouth daily.   nitroGLYCERIN 0.4 MG SL tablet Commonly known as:  NITROSTAT Place 0.4 mg under the tongue every  5 (five) minutes as needed for chest pain.   polyethylene glycol packet Commonly known as:  MIRALAX / GLYCOLAX Take 17 g by mouth daily as needed for mild constipation.   potassium chloride SA 20 MEQ tablet Commonly known as:  K-DUR,KLOR-CON Take 40 mEq by mouth 2 (  two) times daily.   sennosides-docusate sodium 8.6-50 MG tablet Commonly known as:  SENOKOT-S Take 2 tablets by mouth at bedtime.   triamcinolone cream 0.1 % Commonly known as:  KENALOG Apply 1 application topically daily as needed for rash.   warfarin 2.5 MG tablet Commonly known as:  COUMADIN Take 2.5 mg by mouth daily at 6 PM. Or as directed            Durable Medical Equipment        Start     Ordered   08/18/17 1221  For home use only DME Nebulizer/meds  Once    Question:  Patient needs a nebulizer to treat with the following condition  Answer:  Reactive airway disease   08/18/17 1220     Follow-up Information    [facility] Follow [facility].   Why:  They will do your home health care at your home Contact information: [place] [address] [phone]        Belva Crome, MD Follow up.   Specialty:  Cardiology Why:  the office will call you with a follow up appt.  Contact information: [address] [place] [address] [phone]        Lavone Orn, MD Follow up in 1 week(s).   Specialty:  Internal Medicine Contact information: [address] [place] [address] [phone]            Time coordinating discharge: 35 min  Signed:  Demontre Padin Alison Stalling   [facility] 08/18/2017, 12:26 PM

## 2017-08-18 NOTE — Progress Notes (Signed)
SATURATION QUALIFICATIONS: (This note is used to comply with regulatory documentation for home oxygen)  Patient Saturations on Room Air at Rest =96%  Patient Saturations on Room Air while Ambulating = 94%  O2 was not needed during ambulation.

## 2017-08-18 NOTE — Progress Notes (Signed)
ANTICOAGULATION CONSULT NOTE  Pharmacy Consult:  Coumadin Indication: atrial fibrillation  Patient Measurements: Height: 5\' 3"  (160 cm) Weight: 176 lb 3.2 oz (79.9 kg) IBW/kg (Calculated) : 56.9  Vital Signs: Temp: 98 F (36.7 C) (10/10 0441) Temp Source: Oral (10/10 0441) BP: 127/63 (10/10 0441) Pulse Rate: 64 (10/10 0441)  Labs:  Recent Labs  08/15/17 0917  08/15/17 1332 08/15/17 2051 08/16/17 0459 08/17/17 0355 08/18/17 0546  HGB 13.1  --   --   --   --  13.8  --   HCT 40.6  --   --   --   --  43.4  --   PLT 174  --   --   --   --  198  --   LABPROT  --   < > 19.8*  --  20.8* 21.4* 27.0*  INR  --   < > 1.70  --  1.81 1.87 2.52  CREATININE 1.00  --   --   --  0.96 1.22 1.23  TROPONINI <0.03  --  <0.03 <0.03  --   --   --   < > = values in this interval not displayed.  Estimated Creatinine Clearance: 35.8 mL/min (by C-G formula based on SCr of 1.23 mg/dL).   Assessment: 73 YOM with history of Afib to continue on Coumadin from PTA.  Patient's INR is sub-therapeutic on admit at 1.7.  CBC stable; no bleeding reported.  INR today = 2.52  Hospital doses since admission (10/7): 4mg  x2, 5mg  x1  Home Coumadin dose: 2.5mg  PO daily   Goal of Therapy:  INR 2-3 Monitor platelets by anticoagulation protocol: Yes    Plan:  Warfarin 2.5mg  PO today Daily PT / INR  Thank you Florinda Marker, Student Pharmacist 08/18/2017, 8:12 AM

## 2017-08-18 NOTE — Progress Notes (Signed)
Pt discharged home with son and daughter-in-law. Nebulizer delivered to patient in room. [facility] set up by case management. AVS and scripts reviewed in full with patient and family. All questions answered to patient and family satisfaction. All belongings sent with patient. VSS. BP 126/61 (BP Location: Right Arm)   Pulse 67   Temp 98.2 F (36.8 C) (Oral)   Resp 18   Ht 5\' 3"  (1.6 m)   Wt 79.9 kg (176 lb 3.2 oz)   SpO2 97%   BMI 31.21 kg/m

## 2017-08-24 DIAGNOSIS — J209 Acute bronchitis, unspecified: Secondary | ICD-10-CM | POA: Diagnosis not present

## 2017-08-24 DIAGNOSIS — B372 Candidiasis of skin and nail: Secondary | ICD-10-CM | POA: Diagnosis not present

## 2017-08-24 DIAGNOSIS — Z79899 Other long term (current) drug therapy: Secondary | ICD-10-CM | POA: Diagnosis not present

## 2017-08-24 DIAGNOSIS — I5031 Acute diastolic (congestive) heart failure: Secondary | ICD-10-CM | POA: Diagnosis not present

## 2017-08-25 DIAGNOSIS — H52221 Regular astigmatism, right eye: Secondary | ICD-10-CM | POA: Diagnosis not present

## 2017-09-02 DIAGNOSIS — I83223 Varicose veins of left lower extremity with both ulcer of ankle and inflammation: Secondary | ICD-10-CM | POA: Diagnosis not present

## 2017-09-02 DIAGNOSIS — B379 Candidiasis, unspecified: Secondary | ICD-10-CM | POA: Diagnosis not present

## 2017-09-02 DIAGNOSIS — I87323 Chronic venous hypertension (idiopathic) with inflammation of bilateral lower extremity: Secondary | ICD-10-CM | POA: Diagnosis not present

## 2017-09-02 DIAGNOSIS — I878 Other specified disorders of veins: Secondary | ICD-10-CM | POA: Diagnosis not present

## 2017-09-02 DIAGNOSIS — L97821 Non-pressure chronic ulcer of other part of left lower leg limited to breakdown of skin: Secondary | ICD-10-CM | POA: Diagnosis not present

## 2017-09-02 DIAGNOSIS — L97329 Non-pressure chronic ulcer of left ankle with unspecified severity: Secondary | ICD-10-CM | POA: Diagnosis not present

## 2017-09-02 DIAGNOSIS — I87312 Chronic venous hypertension (idiopathic) with ulcer of left lower extremity: Secondary | ICD-10-CM | POA: Diagnosis not present

## 2017-09-06 ENCOUNTER — Ambulatory Visit: Payer: Medicare Other | Admitting: Physician Assistant

## 2017-10-04 DIAGNOSIS — Z7901 Long term (current) use of anticoagulants: Secondary | ICD-10-CM | POA: Diagnosis not present

## 2017-10-04 DIAGNOSIS — Z5181 Encounter for therapeutic drug level monitoring: Secondary | ICD-10-CM | POA: Diagnosis not present

## 2017-10-05 DIAGNOSIS — N390 Urinary tract infection, site not specified: Secondary | ICD-10-CM | POA: Diagnosis not present

## 2017-10-07 DIAGNOSIS — I739 Peripheral vascular disease, unspecified: Secondary | ICD-10-CM | POA: Diagnosis not present

## 2017-10-07 DIAGNOSIS — L97929 Non-pressure chronic ulcer of unspecified part of left lower leg with unspecified severity: Secondary | ICD-10-CM | POA: Diagnosis not present

## 2017-10-07 DIAGNOSIS — L97329 Non-pressure chronic ulcer of left ankle with unspecified severity: Secondary | ICD-10-CM | POA: Diagnosis not present

## 2017-10-07 DIAGNOSIS — I890 Lymphedema, not elsewhere classified: Secondary | ICD-10-CM | POA: Diagnosis not present

## 2017-10-07 DIAGNOSIS — I83223 Varicose veins of left lower extremity with both ulcer of ankle and inflammation: Secondary | ICD-10-CM | POA: Diagnosis not present

## 2017-10-07 DIAGNOSIS — I872 Venous insufficiency (chronic) (peripheral): Secondary | ICD-10-CM | POA: Diagnosis not present

## 2017-10-07 DIAGNOSIS — L97821 Non-pressure chronic ulcer of other part of left lower leg limited to breakdown of skin: Secondary | ICD-10-CM | POA: Diagnosis not present

## 2017-10-07 DIAGNOSIS — I83029 Varicose veins of left lower extremity with ulcer of unspecified site: Secondary | ICD-10-CM | POA: Diagnosis not present

## 2017-10-07 DIAGNOSIS — I83028 Varicose veins of left lower extremity with ulcer other part of lower leg: Secondary | ICD-10-CM | POA: Diagnosis not present

## 2017-10-11 DIAGNOSIS — Z7982 Long term (current) use of aspirin: Secondary | ICD-10-CM | POA: Diagnosis not present

## 2017-10-11 DIAGNOSIS — I83023 Varicose veins of left lower extremity with ulcer of ankle: Secondary | ICD-10-CM | POA: Diagnosis not present

## 2017-10-11 DIAGNOSIS — I4891 Unspecified atrial fibrillation: Secondary | ICD-10-CM | POA: Diagnosis not present

## 2017-10-11 DIAGNOSIS — Z7901 Long term (current) use of anticoagulants: Secondary | ICD-10-CM | POA: Diagnosis not present

## 2017-10-11 DIAGNOSIS — I5040 Unspecified combined systolic (congestive) and diastolic (congestive) heart failure: Secondary | ICD-10-CM | POA: Diagnosis not present

## 2017-10-11 DIAGNOSIS — Z5181 Encounter for therapeutic drug level monitoring: Secondary | ICD-10-CM | POA: Diagnosis not present

## 2017-10-11 DIAGNOSIS — L89892 Pressure ulcer of other site, stage 2: Secondary | ICD-10-CM | POA: Diagnosis not present

## 2017-10-11 DIAGNOSIS — L97301 Non-pressure chronic ulcer of unspecified ankle limited to breakdown of skin: Secondary | ICD-10-CM | POA: Diagnosis not present

## 2017-10-13 ENCOUNTER — Encounter: Payer: Self-pay | Admitting: Internal Medicine

## 2017-10-13 ENCOUNTER — Encounter (INDEPENDENT_AMBULATORY_CARE_PROVIDER_SITE_OTHER): Payer: Self-pay

## 2017-10-13 ENCOUNTER — Ambulatory Visit: Payer: Medicare Other | Admitting: Internal Medicine

## 2017-10-13 VITALS — BP 132/66 | HR 44 | Ht 63.0 in | Wt 181.6 lb

## 2017-10-13 DIAGNOSIS — Z950 Presence of cardiac pacemaker: Secondary | ICD-10-CM

## 2017-10-13 DIAGNOSIS — I482 Chronic atrial fibrillation, unspecified: Secondary | ICD-10-CM

## 2017-10-13 LAB — CUP PACEART INCLINIC DEVICE CHECK
Battery Voltage: 2.86 V
Date Time Interrogation Session: 20181205152422
Implantable Lead Implant Date: 20111025
Implantable Lead Location: 753860
Implantable Pulse Generator Implant Date: 20111025
Lead Channel Pacing Threshold Pulse Width: 0.4 ms
MDC IDC LEAD IMPLANT DT: 20111025
MDC IDC LEAD LOCATION: 753859
MDC IDC MSMT BATTERY REMAINING LONGEVITY: 68 mo
MDC IDC MSMT LEADCHNL RV IMPEDANCE VALUE: 912.5 Ohm
MDC IDC MSMT LEADCHNL RV PACING THRESHOLD AMPLITUDE: 0.5 V
MDC IDC MSMT LEADCHNL RV SENSING INTR AMPL: 12 mV
MDC IDC PG SERIAL: 7178130
MDC IDC SET LEADCHNL RV PACING AMPLITUDE: 0.75 V
MDC IDC SET LEADCHNL RV PACING PULSEWIDTH: 0.4 ms
MDC IDC SET LEADCHNL RV SENSING SENSITIVITY: 2 mV
MDC IDC STAT BRADY RA PERCENT PACED: 0 %
MDC IDC STAT BRADY RV PERCENT PACED: 82 %
Pulse Gen Model: 2210

## 2017-10-13 NOTE — Progress Notes (Signed)
HPI [name] comes today for ongoing evaluation and management of his permanent pacemaker, and chronic atrial fibrillation.  The patient is a very pleasant 83 year old man with the above problems along with diastolic heart failure and venous insufficiency.  In the interim, he has had an episode of shortness of breath requiring hospitalization.  He is also had an episode of cellulitis requiring hospitalization.  He has not had syncope and denies chest pain.  He has chronic dyspnea with exertion but his mobility is limited secondary to his advanced age, arthritis, and other comorbidities. Allergies  Allergen Reactions  . Avelox [Moxifloxacin Hcl In Nacl] Other (See Comments)    Messed up lungs  . Indomethacin Other (See Comments)    Renal insufficiency  . Levaquin [Levofloxacin] Other (See Comments)    Messed up lungs  . Lipitor [Atorvastatin] Nausea Only, Palpitations and Other (See Comments)    Irregular heart beat also  . Moxifloxacin Other (See Comments)    Respiratory distress  . Pravachol [Pravastatin Sodium] Nausea Only, Palpitations and Other (See Comments)  . Aleve [Naproxen Sodium] Nausea And Vomiting and Other (See Comments)    GI Upset  . Norpace [Disopyramide Phosphate] Other (See Comments)    Urinary retention  . Disopyramide Other (See Comments)    Urinary retention  . Morphine Nausea Only    Irregular hear beat  . Pravastatin Nausea Only    Irregular heart beat  . Amlodipine Besylate Rash  . Azithromycin Rash  . Bactrim [Sulfamethoxazole-Trimethoprim] Rash  . Clinoril [Sulindac] Rash  . Codeine Other (See Comments)    unknown  . Digoxin Rash  . Lorazepam Other (See Comments)    unknown  . Morphine And Related Other (See Comments)    unknown  . Other Other (See Comments)    Tripilenomenon? Per paperwork  . Oxycodone-Acetaminophen Rash  . Percocet [Oxycodone-Acetaminophen] Rash  . Sulfamethoxazole-Trimethoprim Rash  . Sulfonamide Derivatives Rash  .  Uloric [Febuxostat] Other (See Comments)    unknown     Current Outpatient Medications  Medication Sig Dispense Refill  . acetaminophen (TYLENOL) 500 MG tablet Take 500 mg by mouth at bedtime.     Marland Kitchen albuterol (PROVENTIL) (2.5 MG/3ML) 0.083% nebulizer solution Take 3 mLs (2.5 mg total) by nebulization every 4 (four) hours as needed for wheezing or shortness of breath. 75 mL 12  . allopurinol (ZYLOPRIM) 300 MG tablet Take 300 mg by mouth daily.     Marland Kitchen ALPRAZolam (XANAX) 0.5 MG tablet Take 1 tablet (0.5 mg total) by mouth at bedtime as needed for anxiety. For sleep (Patient taking differently: Take 0.5 mg by mouth at bedtime. For sleep) 5 tablet 0  . aspirin 81 MG tablet Take 81 mg by mouth every other day.     Marland Kitchen atenolol (TENORMIN) 50 MG tablet Take 50 mg by mouth daily.     . Cholecalciferol 2000 UNITS CAPS Take 4,000 Units by mouth daily.    . clotrimazole-betamethasone (LOTRISONE) cream Apply 1 application topically as directed.  0  . colchicine 0.6 MG tablet Take 0.6 mg by mouth daily as needed (gout).     . furosemide (LASIX) 40 MG tablet 80 mg every AM and 40 mg every PM 120 tablet 0  . gabapentin (NEURONTIN) 100 MG capsule Take 100 mg by mouth 3 (three) times daily. Takes at 0900,1700,1900 daily    . loratadine (CLARITIN) 10 MG tablet Take 1 tablet (10 mg total) by mouth daily. 30 tablet 0  .  nitroGLYCERIN (NITROSTAT) 0.4 MG SL tablet Place 0.4 mg under the tongue every 5 (five) minutes as needed for chest pain.    . polyethylene glycol (MIRALAX / GLYCOLAX) packet Take 17 g by mouth daily as needed for mild constipation.    . potassium chloride SA (K-DUR,KLOR-CON) 20 MEQ tablet Take 40 mEq by mouth 2 (two) times daily.     . sennosides-docusate sodium (SENOKOT-S) 8.6-50 MG tablet Take 2 tablets by mouth at bedtime.    . triamcinolone cream (KENALOG) 0.1 % Apply 1 application topically daily as needed for rash.  2  . warfarin (COUMADIN) 2.5 MG tablet Take 2.5 mg by mouth daily at 6 PM. Or  as directed     No current facility-administered medications for this visit.      Past Medical History:  Diagnosis Date  . Arrhythmia    afib  . Arthritis    "back and hands" (09/26/2015)  . Basal cell carcinoma   . CHF (congestive heart failure) ([place])   . Coronary artery disease   . CVI (common variable immunodeficiency) ([place])   . DJD (degenerative joint disease)   . ED (erectile dysfunction)   . GERD (gastroesophageal reflux disease)   . Gout   . Heart attack ([place]) 1986  . Hyperlipidemia   . Hypertension   . IFG (impaired fasting glucose)   . Neuropathy   . Non Hodgkin's lymphoma ([place]) dx'd 2006  . PAC (premature atrial contraction)   . PE (pulmonary embolism) 2007   "when I was taking chemo"  . Peptic ulcer disease   . Pneumonia ~ 2007 X 2  . Squamous carcinoma   . Venous stasis ulcers (H[facility])     ROS:   All systems reviewed and negative except as noted in the HPI.   Past Surgical History:  Procedure Laterality Date  . BACK SURGERY    . CARDIAC CATHETERIZATION  1990  . CATARACT EXTRACTION W/ INTRAOCULAR LENS  IMPLANT, BILATERAL Bilateral 10/2006 ~ 11/2006  . CORONARY ANGIOPLASTY    . CORONARY ARTERY BYPASS GRAFT  1990   CABG X3  . ELBOW SURGERY Left    "related to gout"  . INSERT / REPLACE / REMOVE PACEMAKER  08/2010  . KNEE ARTHROSCOPY Left   . LUMBAR DISC SURGERY     "had pinched nerve; had to make room for it"  . NASAL SINUS SURGERY  1980   "related to bacteria infection in sinus"  . SQUAMOUS CELL CARCINOMA EXCISION Right 09/12/2015   cheek  . TONSILLECTOMY  1972     Family History  Problem Relation Age of Onset  . Obesity Mother   . Lung disease Father   . Diabetes type II Sister   . Heart disease Brother   . Diabetes type II Daughter      Social History   Socioeconomic History  . Marital status: Widowed    Spouse name: Not on file  . Number of children: Not on file  . Years of education: Not on file  . Highest education level: Not on  file  Social Needs  . Financial resource strain: Not on file  . Food insecurity - worry: Not on file  . Food insecurity - inability: Not on file  . Transportation needs - medical: Not on file  . Transportation needs - non-medical: Not on file  Occupational History  . Not on file  Tobacco Use  . Smoking status: Former Smoker    Packs/day: 1.00    Years: 1.00  Pack years: 1.00    Types: Cigarettes  . Smokeless tobacco: Never Used  Substance and Sexual Activity  . Alcohol use: No  . Drug use: No  . Sexual activity: No  Other Topics Concern  . Not on file  Social History Narrative  . Not on file     BP 132/66   Pulse (!) 44   Ht 5\' 3"  (1.6 m)   Wt 181 lb 9.6 oz (82.4 kg)   SpO2 96%   BMI 32.17 kg/m   Physical Exam:  Well appearing elderly man, NAD HEENT: Unremarkable Neck:   7 cm JVD, no thyromegally Lymphatics:  No adenopathy Back:  No CVA tenderness Lungs:  Clear, except for rales in the bases, right greater than left HEART:  Regular rate rhythm, no murmurs, no rubs, no clicks Abd:  soft, positive bowel sounds, no organomegally, no rebound, no guarding Ext:  2 plus pulses, no edema, no cyanosis, no clubbing Skin:  No rashes no nodules Neuro:  CN II through XII intact, motor grossly intact 1.  DEVICE  Normal device function.  See PaceArt for details.   Assess/Plan: 1. Atrial fibrillation -his ventricular rate is well controlled.  He will continue his current medical therapy including warfarin. 2.  Chronic diastolic heart failure -his symptoms are class II and he appears to be well compensated.  He has been bothered by some peripheral edema and he is encouraged to maintain a low-sodium diet.  In addition, his legs have been wrapped chronically. 3.  Pacemaker -his St Jude dual-chamber pacemaker is working normally.  We will plan to recheck in several months.  He has 6 years of battery longevity.  Crissie Sickles, MD

## 2017-10-13 NOTE — Patient Instructions (Signed)

## 2017-10-14 DIAGNOSIS — Z7901 Long term (current) use of anticoagulants: Secondary | ICD-10-CM | POA: Diagnosis not present

## 2017-11-03 DIAGNOSIS — Z85828 Personal history of other malignant neoplasm of skin: Secondary | ICD-10-CM | POA: Diagnosis not present

## 2017-11-03 DIAGNOSIS — L821 Other seborrheic keratosis: Secondary | ICD-10-CM | POA: Diagnosis not present

## 2017-11-03 DIAGNOSIS — L570 Actinic keratosis: Secondary | ICD-10-CM | POA: Diagnosis not present

## 2017-11-03 DIAGNOSIS — D0439 Carcinoma in situ of skin of other parts of face: Secondary | ICD-10-CM | POA: Diagnosis not present

## 2017-11-03 DIAGNOSIS — C44622 Squamous cell carcinoma of skin of right upper limb, including shoulder: Secondary | ICD-10-CM | POA: Diagnosis not present

## 2017-11-18 DIAGNOSIS — L97321 Non-pressure chronic ulcer of left ankle limited to breakdown of skin: Secondary | ICD-10-CM | POA: Diagnosis not present

## 2017-11-18 DIAGNOSIS — I83223 Varicose veins of left lower extremity with both ulcer of ankle and inflammation: Secondary | ICD-10-CM | POA: Diagnosis not present

## 2017-11-18 DIAGNOSIS — L905 Scar conditions and fibrosis of skin: Secondary | ICD-10-CM | POA: Diagnosis not present

## 2017-11-18 DIAGNOSIS — I890 Lymphedema, not elsewhere classified: Secondary | ICD-10-CM | POA: Diagnosis not present

## 2017-11-18 DIAGNOSIS — I872 Venous insufficiency (chronic) (peripheral): Secondary | ICD-10-CM | POA: Diagnosis not present

## 2017-12-07 DIAGNOSIS — I482 Chronic atrial fibrillation: Secondary | ICD-10-CM | POA: Diagnosis not present

## 2017-12-07 DIAGNOSIS — I2510 Atherosclerotic heart disease of native coronary artery without angina pectoris: Secondary | ICD-10-CM | POA: Diagnosis not present

## 2017-12-07 DIAGNOSIS — I5042 Chronic combined systolic (congestive) and diastolic (congestive) heart failure: Secondary | ICD-10-CM | POA: Diagnosis not present

## 2017-12-07 DIAGNOSIS — I10 Essential (primary) hypertension: Secondary | ICD-10-CM | POA: Diagnosis not present

## 2018-01-06 DIAGNOSIS — Z7901 Long term (current) use of anticoagulants: Secondary | ICD-10-CM | POA: Diagnosis not present

## 2018-01-12 ENCOUNTER — Ambulatory Visit (INDEPENDENT_AMBULATORY_CARE_PROVIDER_SITE_OTHER): Payer: Medicare Other | Admitting: *Deleted

## 2018-01-12 DIAGNOSIS — I495 Sick sinus syndrome: Secondary | ICD-10-CM | POA: Diagnosis not present

## 2018-01-12 NOTE — Progress Notes (Signed)
Remote pacemaker transmission.   

## 2018-01-13 ENCOUNTER — Encounter: Payer: Self-pay | Admitting: Cardiology

## 2018-01-18 LAB — CUP PACEART INCLINIC DEVICE CHECK
Date Time Interrogation Session: 20190312204136
Implantable Lead Implant Date: 20111025
Implantable Lead Location: 753859
MDC IDC LEAD IMPLANT DT: 20111025
MDC IDC LEAD LOCATION: 753860
MDC IDC PG IMPLANT DT: 20111025
Pulse Gen Serial Number: 7178130

## 2018-02-02 DIAGNOSIS — L0109 Other impetigo: Secondary | ICD-10-CM | POA: Diagnosis not present

## 2018-02-02 DIAGNOSIS — Z7901 Long term (current) use of anticoagulants: Secondary | ICD-10-CM | POA: Diagnosis not present

## 2018-02-02 DIAGNOSIS — L570 Actinic keratosis: Secondary | ICD-10-CM | POA: Diagnosis not present

## 2018-02-02 DIAGNOSIS — Z85828 Personal history of other malignant neoplasm of skin: Secondary | ICD-10-CM | POA: Diagnosis not present

## 2018-02-02 DIAGNOSIS — D0439 Carcinoma in situ of skin of other parts of face: Secondary | ICD-10-CM | POA: Diagnosis not present

## 2018-02-02 DIAGNOSIS — L0889 Other specified local infections of the skin and subcutaneous tissue: Secondary | ICD-10-CM | POA: Diagnosis not present

## 2018-02-28 ENCOUNTER — Encounter (HOSPITAL_COMMUNITY): Payer: Self-pay | Admitting: Emergency Medicine

## 2018-02-28 ENCOUNTER — Emergency Department (HOSPITAL_COMMUNITY): Payer: Medicare Other

## 2018-02-28 ENCOUNTER — Inpatient Hospital Stay (HOSPITAL_COMMUNITY)
Admission: EM | Admit: 2018-02-28 | Discharge: 2018-03-04 | DRG: 291 | Disposition: A | Discharge status: Home / Self Care | Payer: Medicare Other | Attending: Internal Medicine | Admitting: Internal Medicine

## 2018-02-28 ENCOUNTER — Other Ambulatory Visit: Payer: Self-pay

## 2018-02-28 DIAGNOSIS — T380X5A Adverse effect of glucocorticoids and synthetic analogues, initial encounter: Secondary | ICD-10-CM | POA: Diagnosis not present

## 2018-02-28 DIAGNOSIS — K219 Gastro-esophageal reflux disease without esophagitis: Secondary | ICD-10-CM | POA: Diagnosis not present

## 2018-02-28 DIAGNOSIS — I872 Venous insufficiency (chronic) (peripheral): Secondary | ICD-10-CM | POA: Diagnosis not present

## 2018-02-28 DIAGNOSIS — N179 Acute kidney failure, unspecified: Secondary | ICD-10-CM | POA: Diagnosis not present

## 2018-02-28 DIAGNOSIS — Z888 Allergy status to other drugs, medicaments and biological substances status: Secondary | ICD-10-CM

## 2018-02-28 DIAGNOSIS — Z79899 Other long term (current) drug therapy: Secondary | ICD-10-CM

## 2018-02-28 DIAGNOSIS — Z9861 Coronary angioplasty status: Secondary | ICD-10-CM

## 2018-02-28 DIAGNOSIS — I110 Hypertensive heart disease with heart failure: Principal | ICD-10-CM | POA: Diagnosis present

## 2018-02-28 DIAGNOSIS — Z85828 Personal history of other malignant neoplasm of skin: Secondary | ICD-10-CM

## 2018-02-28 DIAGNOSIS — I739 Peripheral vascular disease, unspecified: Secondary | ICD-10-CM | POA: Diagnosis present

## 2018-02-28 DIAGNOSIS — E785 Hyperlipidemia, unspecified: Secondary | ICD-10-CM | POA: Diagnosis present

## 2018-02-28 DIAGNOSIS — C8590 Non-Hodgkin lymphoma, unspecified, unspecified site: Secondary | ICD-10-CM | POA: Diagnosis present

## 2018-02-28 DIAGNOSIS — J9811 Atelectasis: Secondary | ICD-10-CM | POA: Diagnosis present

## 2018-02-28 DIAGNOSIS — J9600 Acute respiratory failure, unspecified whether with hypoxia or hypercapnia: Secondary | ICD-10-CM | POA: Diagnosis present

## 2018-02-28 DIAGNOSIS — M109 Gout, unspecified: Secondary | ICD-10-CM | POA: Diagnosis present

## 2018-02-28 DIAGNOSIS — R0600 Dyspnea, unspecified: Secondary | ICD-10-CM

## 2018-02-28 DIAGNOSIS — R0682 Tachypnea, not elsewhere classified: Secondary | ICD-10-CM | POA: Diagnosis not present

## 2018-02-28 DIAGNOSIS — Z951 Presence of aortocoronary bypass graft: Secondary | ICD-10-CM | POA: Diagnosis not present

## 2018-02-28 DIAGNOSIS — E876 Hypokalemia: Secondary | ICD-10-CM | POA: Diagnosis not present

## 2018-02-28 DIAGNOSIS — I2510 Atherosclerotic heart disease of native coronary artery without angina pectoris: Secondary | ICD-10-CM | POA: Diagnosis present

## 2018-02-28 DIAGNOSIS — I4891 Unspecified atrial fibrillation: Secondary | ICD-10-CM | POA: Diagnosis present

## 2018-02-28 DIAGNOSIS — D72829 Elevated white blood cell count, unspecified: Secondary | ICD-10-CM

## 2018-02-28 DIAGNOSIS — R0603 Acute respiratory distress: Secondary | ICD-10-CM | POA: Diagnosis not present

## 2018-02-28 DIAGNOSIS — J441 Chronic obstructive pulmonary disease with (acute) exacerbation: Secondary | ICD-10-CM | POA: Diagnosis not present

## 2018-02-28 DIAGNOSIS — Z7901 Long term (current) use of anticoagulants: Secondary | ICD-10-CM

## 2018-02-28 DIAGNOSIS — R0602 Shortness of breath: Secondary | ICD-10-CM | POA: Diagnosis not present

## 2018-02-28 DIAGNOSIS — Z950 Presence of cardiac pacemaker: Secondary | ICD-10-CM | POA: Diagnosis not present

## 2018-02-28 DIAGNOSIS — Z87891 Personal history of nicotine dependence: Secondary | ICD-10-CM | POA: Diagnosis not present

## 2018-02-28 DIAGNOSIS — Z7951 Long term (current) use of inhaled steroids: Secondary | ICD-10-CM

## 2018-02-28 DIAGNOSIS — I482 Chronic atrial fibrillation, unspecified: Secondary | ICD-10-CM | POA: Diagnosis present

## 2018-02-28 DIAGNOSIS — I5033 Acute on chronic diastolic (congestive) heart failure: Secondary | ICD-10-CM | POA: Diagnosis not present

## 2018-02-28 DIAGNOSIS — I252 Old myocardial infarction: Secondary | ICD-10-CM

## 2018-02-28 DIAGNOSIS — J440 Chronic obstructive pulmonary disease with acute lower respiratory infection: Secondary | ICD-10-CM | POA: Diagnosis not present

## 2018-02-28 DIAGNOSIS — J9601 Acute respiratory failure with hypoxia: Secondary | ICD-10-CM | POA: Diagnosis present

## 2018-02-28 DIAGNOSIS — Z86711 Personal history of pulmonary embolism: Secondary | ICD-10-CM

## 2018-02-28 DIAGNOSIS — Z7982 Long term (current) use of aspirin: Secondary | ICD-10-CM

## 2018-02-28 DIAGNOSIS — J209 Acute bronchitis, unspecified: Secondary | ICD-10-CM | POA: Diagnosis not present

## 2018-02-28 LAB — COMPREHENSIVE METABOLIC PANEL
ALT: 22 U/L (ref 17–63)
AST: 32 U/L (ref 15–41)
Albumin: 3.2 g/dL — ABNORMAL LOW (ref 3.5–5.0)
Alkaline Phosphatase: 100 U/L (ref 38–126)
Anion gap: 15 (ref 5–15)
BUN: 22 mg/dL — AB (ref 6–20)
CHLORIDE: 101 mmol/L (ref 101–111)
CO2: 26 mmol/L (ref 22–32)
Calcium: 9.5 mg/dL (ref 8.9–10.3)
Creatinine, Ser: 1.13 mg/dL (ref 0.61–1.24)
GFR calc Af Amer: >60 mL/min (ref >60)
GFR, EST NON AFRICAN AMERICAN: 54 mL/min — AB (ref >60)
Glucose, Bld: 123 mg/dL — ABNORMAL HIGH (ref 65–99)
POTASSIUM: 3.3 mmol/L — AB (ref 3.5–5.1)
SODIUM: 142 mmol/L (ref 135–145)
Total Bilirubin: 0.8 mg/dL (ref 0.3–1.2)
Total Protein: 7.7 g/dL (ref 6.5–8.1)

## 2018-02-28 LAB — CBC
HCT: 40 % (ref 39.0–52.0)
Hemoglobin: 13.3 g/dL (ref 13.0–17.0)
MCH: 34.7 pg — ABNORMAL HIGH (ref 26.0–34.0)
MCHC: 33.3 g/dL (ref 30.0–36.0)
MCV: 104.4 fL — AB (ref 78.0–100.0)
PLATELETS: 206 10*3/uL (ref 150–400)
RBC: 3.83 MIL/uL — ABNORMAL LOW (ref 4.22–5.81)
RDW: 14.5 % (ref 11.5–15.5)
WBC: 12.8 10*3/uL — AB (ref 4.0–10.5)

## 2018-02-28 LAB — TROPONIN I

## 2018-02-28 LAB — PROTIME-INR
INR: 2.83
Prothrombin Time: 29.5 seconds — ABNORMAL HIGH (ref 11.4–15.2)

## 2018-02-28 LAB — PROCALCITONIN

## 2018-02-28 LAB — BRAIN NATRIURETIC PEPTIDE: B NATRIURETIC PEPTIDE 5: 184.4 pg/mL — AB (ref 0.0–100.0)

## 2018-02-28 MED ORDER — POTASSIUM CHLORIDE CRYS ER 20 MEQ PO TBCR
40.0000 meq | EXTENDED_RELEASE_TABLET | Freq: Once | ORAL | Status: AC
  Administered 2018-02-28: 40 meq via ORAL
  Filled 2018-02-28: qty 2

## 2018-02-28 MED ORDER — MAGNESIUM SULFATE 2 GM/50ML IV SOLN
2.0000 g | Freq: Once | INTRAVENOUS | Status: AC
  Administered 2018-02-28: 2 g via INTRAVENOUS
  Filled 2018-02-28: qty 50

## 2018-02-28 MED ORDER — SENNOSIDES-DOCUSATE SODIUM 8.6-50 MG PO TABS
2.0000 | ORAL_TABLET | Freq: Every day | ORAL | Status: DC
  Administered 2018-02-28 – 2018-03-03 (×4): 2 via ORAL
  Filled 2018-02-28 (×4): qty 2

## 2018-02-28 MED ORDER — FUROSEMIDE 10 MG/ML IJ SOLN
80.0000 mg | Freq: Once | INTRAMUSCULAR | Status: AC
  Administered 2018-02-28: 80 mg via INTRAVENOUS
  Filled 2018-02-28: qty 8

## 2018-02-28 MED ORDER — POLYETHYLENE GLYCOL 3350 17 G PO PACK: 17.0000 g | PACK | Freq: Every day | ORAL | Status: DC | PRN

## 2018-02-28 MED ORDER — FUROSEMIDE 40 MG PO TABS
40.0000 mg | ORAL_TABLET | Freq: Two times a day (BID) | ORAL | Status: DC
  Administered 2018-02-28 – 2018-03-04 (×8): 40 mg via ORAL
  Filled 2018-02-28 (×8): qty 1

## 2018-02-28 MED ORDER — WARFARIN SODIUM 2.5 MG PO TABS
2.5000 mg | ORAL_TABLET | Freq: Once | ORAL | Status: AC
Start: 2018-02-28 — End: 2018-02-28
  Administered 2018-02-28: 2.5 mg via ORAL
  Filled 2018-02-28 (×2): qty 1

## 2018-02-28 MED ORDER — ALLOPURINOL 300 MG PO TABS
300.0000 mg | ORAL_TABLET | Freq: Every day | ORAL | Status: DC
  Administered 2018-02-28 – 2018-03-04 (×5): 300 mg via ORAL
  Filled 2018-02-28 (×6): qty 1

## 2018-02-28 MED ORDER — BENZONATATE 100 MG PO CAPS
200.0000 mg | ORAL_CAPSULE | Freq: Two times a day (BID) | ORAL | Status: DC | PRN
  Administered 2018-03-01 – 2018-03-03 (×2): 200 mg via ORAL
  Filled 2018-02-28 (×2): qty 2

## 2018-02-28 MED ORDER — WARFARIN - PHARMACIST DOSING INPATIENT: Freq: Every day | Status: DC

## 2018-02-28 MED ORDER — ALBUTEROL SULFATE (2.5 MG/3ML) 0.083% IN NEBU: 5.0000 mg | INHALATION_SOLUTION | Freq: Once | RESPIRATORY_TRACT | Status: DC

## 2018-02-28 MED ORDER — GABAPENTIN 100 MG PO CAPS
100.0000 mg | ORAL_CAPSULE | ORAL | Status: DC
  Administered 2018-02-28 – 2018-03-04 (×11): 100 mg via ORAL
  Filled 2018-02-28 (×11): qty 1

## 2018-02-28 MED ORDER — WARFARIN SODIUM 2.5 MG PO TABS: 2.5000 mg | ORAL_TABLET | Freq: Every day | ORAL | Status: DC

## 2018-02-28 MED ORDER — IPRATROPIUM-ALBUTEROL 0.5-2.5 (3) MG/3ML IN SOLN
3.0000 mL | Freq: Four times a day (QID) | RESPIRATORY_TRACT | Status: DC
  Administered 2018-02-28: 3 mL via RESPIRATORY_TRACT
  Filled 2018-02-28: qty 3

## 2018-02-28 MED ORDER — VITAMIN D3 25 MCG (1000 UNIT) PO TABS
4000.0000 [IU] | ORAL_TABLET | Freq: Every day | ORAL | Status: DC
  Administered 2018-02-28 – 2018-03-04 (×5): 4000 [IU] via ORAL
  Filled 2018-02-28 (×6): qty 4

## 2018-02-28 MED ORDER — WARFARIN - PHYSICIAN DOSING INPATIENT: Freq: Every day | Status: DC

## 2018-02-28 MED ORDER — METHYLPREDNISOLONE SODIUM SUCC 40 MG IJ SOLR
40.0000 mg | Freq: Three times a day (TID) | INTRAMUSCULAR | Status: DC
  Administered 2018-02-28 – 2018-03-01 (×4): 40 mg via INTRAVENOUS
  Filled 2018-02-28 (×3): qty 1

## 2018-02-28 MED ORDER — IPRATROPIUM-ALBUTEROL 0.5-2.5 (3) MG/3ML IN SOLN: 3.0000 mL | RESPIRATORY_TRACT | Status: DC | PRN

## 2018-02-28 MED ORDER — ATENOLOL 50 MG PO TABS
50.0000 mg | ORAL_TABLET | Freq: Every day | ORAL | Status: DC
  Administered 2018-02-28 – 2018-03-04 (×5): 50 mg via ORAL
  Filled 2018-02-28 (×6): qty 1

## 2018-02-28 MED ORDER — ASPIRIN EC 81 MG PO TBEC
81.0000 mg | DELAYED_RELEASE_TABLET | ORAL | Status: DC
  Administered 2018-02-28 – 2018-03-04 (×3): 81 mg via ORAL
  Filled 2018-02-28 (×3): qty 1

## 2018-02-28 MED ORDER — IPRATROPIUM-ALBUTEROL 0.5-2.5 (3) MG/3ML IN SOLN
3.0000 mL | Freq: Two times a day (BID) | RESPIRATORY_TRACT | Status: DC
  Administered 2018-02-28 – 2018-03-02 (×4): 3 mL via RESPIRATORY_TRACT
  Filled 2018-02-28 (×4): qty 3

## 2018-02-28 MED ORDER — ALPRAZOLAM 0.5 MG PO TABS
0.5000 mg | ORAL_TABLET | Freq: Every evening | ORAL | Status: DC | PRN
  Administered 2018-02-28 – 2018-03-03 (×2): 0.5 mg via ORAL
  Filled 2018-02-28 (×2): qty 1

## 2018-02-28 NOTE — Progress Notes (Signed)
ANTICOAGULATION CONSULT NOTE - Initial Consult  Pharmacy Consult for warfarin Indication: atrial fibrillation  Allergies  Allergen Reactions  . Avelox [Moxifloxacin Hcl In Nacl] Other (See Comments)    Messed up lungs  . Indomethacin Other (See Comments)    Renal insufficiency  . Levaquin [Levofloxacin] Other (See Comments)    Messed up lungs  . Lipitor [Atorvastatin] Nausea Only, Palpitations and Other (See Comments)    Irregular heart beat also  . Moxifloxacin Other (See Comments)    Respiratory distress  . Pravachol [Pravastatin Sodium] Nausea Only, Palpitations and Other (See Comments)  . Aleve [Naproxen Sodium] Nausea And Vomiting and Other (See Comments)    GI Upset  . Norpace [Disopyramide Phosphate] Other (See Comments)    Urinary retention  . Disopyramide Other (See Comments)    Urinary retention  . Morphine Nausea Only    Irregular hear beat  . Pravastatin Nausea Only    Irregular heart beat  . Amlodipine Besylate Rash  . Azithromycin Rash  . Bactrim [Sulfamethoxazole-Trimethoprim] Rash  . Clinoril [Sulindac] Rash  . Codeine Other (See Comments)    unknown  . Digoxin Rash  . Lorazepam Other (See Comments)    unknown  . Morphine And Related Other (See Comments)    unknown  . Other Other (See Comments)    Tripilenomenon? Per paperwork  . Oxycodone-Acetaminophen Rash  . Percocet [Oxycodone-Acetaminophen] Rash  . Sulfamethoxazole-Trimethoprim Rash  . Sulfonamide Derivatives Rash  . Uloric [Febuxostat] Other (See Comments)    unknown    Patient Measurements: Height: 5\' 5"  (165.1 cm) Weight: 183 lb (83 kg) IBW/kg (Calculated) : 61.5 Heparin Dosing Weight:   Vital Signs: Temp: 98.1 F (36.7 C) (04/22 0646) Temp Source: Oral (04/22 0646) BP: 146/65 (04/22 1030) Pulse Rate: 80 (04/22 1030)  Labs: Recent Labs    02/28/18 0727 02/28/18 0901  HGB 13.3  --   HCT 40.0  --   PLT 206  --   LABPROT  --  29.5*  INR  --  2.83  CREATININE 1.13  --    TROPONINI <0.03  --     Estimated Creatinine Clearance: 41.4 mL/min (by C-G formula based on SCr of 1.13 mg/dL).   Medical History: Past Medical History:  Diagnosis Date  . Arrhythmia    afib  . Arthritis    "back and hands" (09/26/2015)  . Basal cell carcinoma   . CHF (congestive heart failure) ([place])   . Coronary artery disease   . CVI (common variable immunodeficiency) ([place])   . DJD (degenerative joint disease)   . ED (erectile dysfunction)   . GERD (gastroesophageal reflux disease)   . Gout   . Heart attack ([place]) 1986  . Hyperlipidemia   . Hypertension   . IFG (impaired fasting glucose)   . Neuropathy   . Non Hodgkin's lymphoma ([place]) dx'd 2006  . PAC (premature atrial contraction)   . PE (pulmonary embolism) 2007   "when I was taking chemo"  . Peptic ulcer disease   . Pneumonia ~ 2007 X 2  . Squamous carcinoma   . Venous stasis ulcers ([facility])    Assessment: 89 YOM on warfarin prior to admission for h/o atrial fibrillation (remote h/o PE).  INR therapeutic at admission.  Patient reports taking warfarin 2.5mg  PO daily (Last dose 4/21).  Today, 02/28/2018  INR = 2.83  CBC: Hgb and pltc WNL  Drug-Drug interactions: steroids may increase effects of warfarin  HFpEF - acute HF may increase effects of warfarin  Goal of Therapy:  INR 2-3  Plan:   Give home dose of warfarin 2.5mg  x1 tonight  Follow INR trend closely  Daily INR  Doreene Eland, PharmD, BCPS.   Pager: [phone] 02/28/2018 12:10 PM

## 2018-02-28 NOTE — ED Notes (Signed)
Repositioned patient in bed.

## 2018-02-28 NOTE — ED Notes (Signed)
SCDs applied per patient request and per verbal MD order.

## 2018-02-28 NOTE — ED Provider Notes (Signed)
[facility] DEPT Provider Note   CSN: [id number] Arrival date & time: 02/28/18  0630     History   Chief Complaint Chief Complaint  Patient presents with  . Shortness of Breath    HPI [name] is a 84 y.o. male.  83 year old male presents with shortness of breath times 1 week.  Called his physician and was prescribed cough medicine over-the-counter as well as albuterol inhalers.  Denies any prior history of COPD.  No associated anginal chest pain.  No CHF symptoms.  Cough is been productive and has had subjective fevers.  Does have a history of pneumonia and this will similar.  Denies any vomiting or diarrhea.  Symptoms are worse with exertion better with rest.  Called EMS and patient given albuterol along with Atrovent and Solu-Medrol and transported here.     Past Medical History:  Diagnosis Date  . Arrhythmia    afib  . Arthritis    "back and hands" (09/26/2015)  . Basal cell carcinoma   . CHF (congestive heart failure) ([place])   . Coronary artery disease   . CVI (common variable immunodeficiency) ([place])   . DJD (degenerative joint disease)   . ED (erectile dysfunction)   . GERD (gastroesophageal reflux disease)   . Gout   . Heart attack ([place]) 1986  . Hyperlipidemia   . Hypertension   . IFG (impaired fasting glucose)   . Neuropathy   . Non Hodgkin's lymphoma ([place]) dx'd 2006  . PAC (premature atrial contraction)   . PE (pulmonary embolism) 2007   "when I was taking chemo"  . Peptic ulcer disease   . Pneumonia ~ 2007 X 2  . Squamous carcinoma   . Venous stasis ulcers [facility])     Patient Active Problem List   Diagnosis Date Noted  . Acute bronchospasm   . CHF (congestive heart failure) ([place])   . Acute respiratory failure ([place]) 08/17/2017  . Dyspnea 08/15/2017  . Pressure injury of skin 03/22/2017  . Weakness 03/21/2017  . Fever in adult 09/26/2015  . Gout 09/26/2015  . Leukocytosis 09/26/2015  . Venous stasis dermatitis  of both lower extremities 07/17/2015  . Bilateral lower leg cellulitis 07/09/2015  . Sepsis due to other organism, without resultant organ failure 07/07/2015  . Chronic combined systolic and diastolic CHF (congestive heart failure) ([place]) 07/07/2015  . Onychomycosis 12/04/2013  . Hx of CABG 04/25/2013  . Coronary artery disease 11/29/2011  . Venous stasis ulcer ([place]) 11/29/2011  . Neuropathy 11/29/2011  . Generalized weakness 11/29/2011  . Atrial fibrillation ([place]) 12/02/2010  . PPM-St.Jude 12/02/2010    Past Surgical History:  Procedure Laterality Date  . BACK SURGERY    . CARDIAC CATHETERIZATION  1990  . CATARACT EXTRACTION W/ INTRAOCULAR LENS  IMPLANT, BILATERAL Bilateral 10/2006 ~ 11/2006  . CORONARY ANGIOPLASTY    . CORONARY ARTERY BYPASS GRAFT  1990   CABG X3  . ELBOW SURGERY Left    "related to gout"  . INSERT / REPLACE / REMOVE PACEMAKER  08/2010  . KNEE ARTHROSCOPY Left   . LUMBAR DISC SURGERY     "had pinched nerve; had to make room for it"  . NASAL SINUS SURGERY  1980   "related to bacteria infection in sinus"  . SQUAMOUS CELL CARCINOMA EXCISION Right 09/12/2015   cheek  . TONSILLECTOMY  1972        Home Medications    Prior to Admission medications   Medication Sig Start Date End  Date Taking? Authorizing Provider  acetaminophen (TYLENOL) 500 MG tablet Take 500 mg by mouth at bedtime.     [provider]  albuterol (PROVENTIL) (2.5 MG/3ML) 0.083% nebulizer solution Take 3 mLs (2.5 mg total) by nebulization every 4 (four) hours as needed for wheezing or shortness of breath. 08/18/17   Geradine Girt, DO  allopurinol (ZYLOPRIM) 300 MG tablet Take 300 mg by mouth daily.     [provider]  ALPRAZolam Duanne Moron) 0.5 MG tablet Take 1 tablet (0.5 mg total) by mouth at bedtime as needed for anxiety. For sleep Patient taking differently: Take 0.5 mg by mouth at bedtime. For sleep 03/23/17   Geradine Girt, DO  aspirin 81 MG tablet Take 81 mg by mouth  every other day.     [provider]  atenolol (TENORMIN) 50 MG tablet Take 50 mg by mouth daily.     [provider]  Cholecalciferol 2000 UNITS CAPS Take 4,000 Units by mouth daily.    [provider]  clotrimazole-betamethasone (LOTRISONE) cream Apply 1 application topically as directed. 08/11/17   [provider]  colchicine 0.6 MG tablet Take 0.6 mg by mouth daily as needed (gout).     [provider]  furosemide (LASIX) 40 MG tablet 80 mg every AM and 40 mg every PM 08/18/17   Eulogio Bear U, DO  gabapentin (NEURONTIN) 100 MG capsule Take 100 mg by mouth 3 (three) times daily. Takes at 0900,1700,1900 daily    [provider]  loratadine (CLARITIN) 10 MG tablet Take 1 tablet (10 mg total) by mouth daily. 08/19/17   Geradine Girt, DO  nitroGLYCERIN (NITROSTAT) 0.4 MG SL tablet Place 0.4 mg under the tongue every 5 (five) minutes as needed for chest pain.    [provider]  polyethylene glycol (MIRALAX / GLYCOLAX) packet Take 17 g by mouth daily as needed for mild constipation.    [provider]  potassium chloride SA (K-DUR,KLOR-CON) 20 MEQ tablet Take 40 mEq by mouth 2 (two) times daily.     [provider]  sennosides-docusate sodium (SENOKOT-S) 8.6-50 MG tablet Take 2 tablets by mouth at bedtime.    [provider]  triamcinolone cream (KENALOG) 0.1 % Apply 1 application topically daily as needed for rash. 03/10/17   [provider]  warfarin (COUMADIN) 2.5 MG tablet Take 2.5 mg by mouth daily at 6 PM. Or as directed    [provider]    Family History Family History  Problem Relation Age of Onset  . Obesity Mother   . Lung disease Father   . Diabetes type II Sister   . Heart disease Brother   . Diabetes type II Daughter     Social History Social History   Tobacco Use  . Smoking status: Former Smoker    Packs/day: 1.00    Years: 1.00    Pack years: 1.00    Types:  Cigarettes  . Smokeless tobacco: Never Used  Substance Use Topics  . Alcohol use: No  . Drug use: No     Allergies   Avelox [moxifloxacin hcl in nacl]; Indomethacin; Levaquin [levofloxacin]; Lipitor [atorvastatin]; Moxifloxacin; Pravachol [pravastatin sodium]; Aleve [naproxen sodium]; Norpace [disopyramide phosphate]; Disopyramide; Morphine; Pravastatin; Amlodipine besylate; Azithromycin; Bactrim [sulfamethoxazole-trimethoprim]; Clinoril [sulindac]; Codeine; Digoxin; Lorazepam; Morphine and related; Other; Oxycodone-acetaminophen; Percocet [oxycodone-acetaminophen]; Sulfamethoxazole-trimethoprim; Sulfonamide derivatives; and Uloric [febuxostat]   Review of Systems Review of Systems  All other systems reviewed and are negative.    Physical Exam  Updated Vital Signs BP (!) 141/66 (BP Location: Left Arm)   Pulse 93   Temp 98.1 F (36.7 C) (Oral)   Resp (!) 23   Ht 1.651 m (5\' 5" )   Wt 83 kg (183 lb)   SpO2 100%   BMI 30.45 kg/m   Physical Exam  Constitutional: He is oriented to person, place, and time. He appears well-developed and well-nourished.  Non-toxic appearance. No distress.  HENT:  Head: Normocephalic and atraumatic.  Eyes: Pupils are equal, round, and reactive to light. Conjunctivae, EOM and lids are normal.  Neck: Normal range of motion. Neck supple. No tracheal deviation present. No thyroid mass present.  Cardiovascular: Normal rate, regular rhythm and normal heart sounds. Exam reveals no gallop.  No murmur heard. Pulmonary/Chest: No stridor. Tachypnea noted. He is in respiratory distress. He has decreased breath sounds in the right lower field and the left lower field. He has wheezes in the right lower field and the left lower field. He has no rhonchi. He has no rales.  Abdominal: Soft. Normal appearance and bowel sounds are normal. He exhibits no distension. There is no tenderness. There is no rebound and no CVA tenderness.  Musculoskeletal: Normal range of motion.  He exhibits no edema or tenderness.  Neurological: He is alert and oriented to person, place, and time. He has normal strength. No cranial nerve deficit or sensory deficit. GCS eye subscore is 4. GCS verbal subscore is 5. GCS motor subscore is 6.  Skin: Skin is warm and dry. No abrasion and no rash noted.  Psychiatric: He has a normal mood and affect. His speech is normal and behavior is normal.  Nursing note and vitals reviewed.    ED Treatments / Results  Labs (all labs ordered are listed, but only abnormal results are displayed) Labs Reviewed  CBC  COMPREHENSIVE METABOLIC PANEL  TROPONIN I  BRAIN NATRIURETIC PEPTIDE    EKG EKG Interpretation  Date/Time:  Monday February 28 2018 06:39:52 EDT Ventricular Rate:  83 PR Interval:    QRS Duration: 147 QT Interval:  435 QTC Calculation: 512 R Axis:   -57 Text Interpretation:  Unknown rhythm, irregular rate Left bundle branch block No significant change since last tracing Confirmed by Lacretia Leigh ([id number]) on 02/28/2018 7:21:24 AM   Radiology No results found.  Procedures Procedures (including critical care time)  Medications Ordered in ED Medications  albuterol (PROVENTIL) (2.5 MG/3ML) 0.083% nebulizer solution 5 mg (has no administration in time range)     Initial Impression / Assessment and Plan / ED Course  I have reviewed the triage vital signs and the nursing notes.  Pertinent labs & imaging results that were available during my care of the patient were reviewed by me and considered in my medical decision making (see chart for details).     Patient's clinical picture consistent with CHF with possible component of bronchospasm.  He is already been treated with Solu-Medrol as well as albuterol.  Given Lasix 80 mg IV push.  Patient's respiratory status is improving.  No need for adjunctive airway support.  Will admit to the hospitalist service  CRITICAL CARE Performed by: Leota Jacobsen Total critical care time: 50  minutes Critical care time was exclusive of separately billable procedures and treating other patients. Critical care was necessary to treat or prevent imminent or life-threatening deterioration. Critical care was time spent personally by me on the following activities: development of treatment plan with patient and/or surrogate as well as nursing, discussions with  consultants, evaluation of patient's response to treatment, examination of patient, obtaining history from patient or surrogate, ordering and performing treatments and interventions, ordering and review of laboratory studies, ordering and review of radiographic studies, pulse oximetry and re-evaluation of patient's condition.   Final Clinical Impressions(s) / ED Diagnoses   Final diagnoses:  None    ED Discharge Orders    None       Lacretia Leigh, MD 02/28/18 [phone]

## 2018-02-28 NOTE — ED Triage Notes (Signed)
Pt presents from home for shortness of breath and productive cough that started last night. Pt reports waking with shortness of breath and taking home albuterol treatment. EMS administered 15mg  Albuterol/ 1mg  Atrovent and 125mg  Solumedrol IVP.

## 2018-02-28 NOTE — H&P (Signed)
History and Physical  [name] QIO:[id number] DOB: Jul 21, 1923 DOA: 02/28/2018  Referring physician: Dr Zenia Resides  PCP: Lavone Orn, MD  Outpatient Specialists: Cardiology Patient coming from: Home  Chief Complaint: Worsening shortness of breath  HPI: [name] is a 84 y.o. male with medical history significant for coronary artery disease status post CABG, chronic atrial fibrillation, chronic stasis dermatitis, chronic diastolic heart failure, PVD, who presented to [facility] ED with complaints of gradually worsening shortness of breath of 3 days duration.  States early this morning he could not catch his breath, even at rest.  Denies any chest pain or palpitations.  Symptoms associated with a nonproductive cough and intermittent chills.  States he has been taking Tylenol daily in the last 3 days for generalized aches.  ED Course: On presentation to the ED chest x-ray revealed increase in pulmonary vascularity and cardiomegaly with possible right middle lobe infiltrates versus atelectasis.  Afebrile.  No ischemic changes on EKG.  Negative troponin. Lab studies remarkable for leukocytosis with wbc 12.8 and hypokalemia with K+ 3.3.  Review of Systems: Review of systems as described in the HPI.  All other systems reviewed and are negative.   Past Medical History:  Diagnosis Date  . Arrhythmia    afib  . Arthritis    "back and hands" (09/26/2015)  . Basal cell carcinoma   . CHF (congestive heart failure) ([place])   . Coronary artery disease   . CVI (common variable immunodeficiency) ([place])   . DJD (degenerative joint disease)   . ED (erectile dysfunction)   . GERD (gastroesophageal reflux disease)   . Gout   . Heart attack ([place]) 1986  . Hyperlipidemia   . Hypertension   . IFG (impaired fasting glucose)   . Neuropathy   . Non Hodgkin's lymphoma ([place]) dx'd 2006  . PAC (premature atrial contraction)   . PE (pulmonary embolism) 2007   "when I was taking chemo"  . Peptic ulcer disease    . Pneumonia ~ 2007 X 2  . Squamous carcinoma   . Venous stasis ulcers (HCC)    Past Surgical History:  Procedure Laterality Date  . BACK SURGERY    . CARDIAC CATHETERIZATION  1990  . CATARACT EXTRACTION W/ INTRAOCULAR LENS  IMPLANT, BILATERAL Bilateral 10/2006 ~ 11/2006  . CORONARY ANGIOPLASTY    . CORONARY ARTERY BYPASS GRAFT  1990   CABG X3  . ELBOW SURGERY Left    "related to gout"  . INSERT / REPLACE / REMOVE PACEMAKER  08/2010  . KNEE ARTHROSCOPY Left   . LUMBAR DISC SURGERY     "had pinched nerve; had to make room for it"  . NASAL SINUS SURGERY  1980   "related to bacteria infection in sinus"  . SQUAMOUS CELL CARCINOMA EXCISION Right 09/12/2015   cheek  . TONSILLECTOMY  1972    Social History:  reports that he has quit smoking. His smoking use included cigarettes. He has a 1.00 pack-year smoking history. He has never used smokeless tobacco. He reports that he does not drink alcohol or use drugs.   Allergies  Allergen Reactions  . Avelox [Moxifloxacin Hcl In Nacl] Other (See Comments)    Messed up lungs  . Indomethacin Other (See Comments)    Renal insufficiency  . Levaquin [Levofloxacin] Other (See Comments)    Messed up lungs  . Lipitor [Atorvastatin] Nausea Only, Palpitations and Other (See Comments)    Irregular heart beat also  . Moxifloxacin Other (See Comments)  Respiratory distress  . Pravachol [Pravastatin Sodium] Nausea Only, Palpitations and Other (See Comments)  . Aleve [Naproxen Sodium] Nausea And Vomiting and Other (See Comments)    GI Upset  . Norpace [Disopyramide Phosphate] Other (See Comments)    Urinary retention  . Disopyramide Other (See Comments)    Urinary retention  . Morphine Nausea Only    Irregular hear beat  . Pravastatin Nausea Only    Irregular heart beat  . Amlodipine Besylate Rash  . Azithromycin Rash  . Bactrim [Sulfamethoxazole-Trimethoprim] Rash  . Clinoril [Sulindac] Rash  . Codeine Other (See Comments)    unknown  .  Digoxin Rash  . Lorazepam Other (See Comments)    unknown  . Morphine And Related Other (See Comments)    unknown  . Other Other (See Comments)    Tripilenomenon? Per paperwork  . Oxycodone-Acetaminophen Rash  . Percocet [Oxycodone-Acetaminophen] Rash  . Sulfamethoxazole-Trimethoprim Rash  . Sulfonamide Derivatives Rash  . Uloric [Febuxostat] Other (See Comments)    unknown    Family History  Problem Relation Age of Onset  . Obesity Mother   . Lung disease Father   . Diabetes type II Sister   . Heart disease Brother   . Diabetes type II Daughter     Brother with lung cancer. Father with lung disease.  Prior to Admission medications   Medication Sig Start Date End Date Taking? Authorizing Provider  acetaminophen (TYLENOL) 500 MG tablet Take 500 mg by mouth at bedtime.     [provider]  albuterol (PROVENTIL) (2.5 MG/3ML) 0.083% nebulizer solution Take 3 mLs (2.5 mg total) by nebulization every 4 (four) hours as needed for wheezing or shortness of breath. 08/18/17   Geradine Girt, DO  allopurinol (ZYLOPRIM) 300 MG tablet Take 300 mg by mouth daily.     [provider]  ALPRAZolam Duanne Moron) 0.5 MG tablet Take 1 tablet (0.5 mg total) by mouth at bedtime as needed for anxiety. For sleep Patient taking differently: Take 0.5 mg by mouth at bedtime. For sleep 03/23/17   Geradine Girt, DO  aspirin 81 MG tablet Take 81 mg by mouth every other day.     [provider]  atenolol (TENORMIN) 50 MG tablet Take 50 mg by mouth daily.     [provider]  Cholecalciferol 2000 UNITS CAPS Take 4,000 Units by mouth daily.    [provider]  clotrimazole-betamethasone (LOTRISONE) cream Apply 1 application topically as directed. 08/11/17   [provider]  colchicine 0.6 MG tablet Take 0.6 mg by mouth daily as needed (gout).     [provider]  furosemide (LASIX) 40 MG tablet 80 mg every AM and 40 mg every PM 08/18/17   Eulogio Bear  U, DO  gabapentin (NEURONTIN) 100 MG capsule Take 100 mg by mouth 3 (three) times daily. Takes at 0900,1700,1900 daily    [provider]  loratadine (CLARITIN) 10 MG tablet Take 1 tablet (10 mg total) by mouth daily. 08/19/17   Geradine Girt, DO  nitroGLYCERIN (NITROSTAT) 0.4 MG SL tablet Place 0.4 mg under the tongue every 5 (five) minutes as needed for chest pain.    [provider]  polyethylene glycol (MIRALAX / GLYCOLAX) packet Take 17 g by mouth daily as needed for mild constipation.    [provider]  potassium chloride SA (K-DUR,KLOR-CON) 20 MEQ tablet Take 40 mEq by mouth 2 (two) times daily.     [provider]  sennosides-docusate sodium (  SENOKOT-S) 8.6-50 MG tablet Take 2 tablets by mouth at bedtime.    [provider]  triamcinolone cream (KENALOG) 0.1 % Apply 1 application topically daily as needed for rash. 03/10/17   [provider]  warfarin (COUMADIN) 2.5 MG tablet Take 2.5 mg by mouth daily at 6 PM. Or as directed    [provider]    Physical Exam: BP (!) 146/65   Pulse 80   Temp 98.1 F (36.7 C) (Oral)   Resp 20   Ht 5\' 5"  (1.651 m)   Wt 83 kg (183 lb)   SpO2 96%   BMI 30.45 kg/m   General: 84 year old Caucasian male well-developed well-nourished in no acute distress.  Alert and oriented x3. Eyes: Anicteric sclera. ENT: Mucous membranes dry with no erythema or exudates. Neck: No JVD or thyromegaly. Cardiovascular: Irregular rate and rhythm with no rubs or gallops. Respiratory: Diffuse rales and wheezes. Abdomen: Soft nontender nondistended with normal bowel sounds x4 quadrant. Skin: Chronic stasis dermatitis in lower extremities.  Trace edema. Musculoskeletal: Trace edema in lower extremities with chronic dermatitis stasis. Psychiatric: Mood is appropriate for condition and setting Neurologic: Alert and oriented x3.          Labs on Admission:  Basic Metabolic Panel: Recent Labs  Lab  02/28/18 0727  NA 142  K 3.3*  CL 101  CO2 26  GLUCOSE 123*  BUN 22*  CREATININE 1.13  CALCIUM 9.5   Liver Function Tests: Recent Labs  Lab 02/28/18 0727  AST 32  ALT 22  ALKPHOS 100  BILITOT 0.8  PROT 7.7  ALBUMIN 3.2*   No results for input(s): LIPASE, AMYLASE in the last 168 hours. No results for input(s): AMMONIA in the last 168 hours. CBC: Recent Labs  Lab 02/28/18 0727  WBC 12.8*  HGB 13.3  HCT 40.0  MCV 104.4*  PLT 206   Cardiac Enzymes: Recent Labs  Lab 02/28/18 0727  TROPONINI <0.03    BNP (last 3 results) Recent Labs    03/21/17 1631 08/15/17 0917 02/28/18 0727  BNP 127.7* 272.4* 184.4*    ProBNP (last 3 results) No results for input(s): PROBNP in the last 8760 hours.  CBG: No results for input(s): GLUCAP in the last 168 hours.  Radiological Exams on Admission: Dg Chest 2 View  Result Date: 02/28/2018 CLINICAL DATA:  84 year old male with increased shortness of breath. History of congestive heart failure. Initial encounter. EXAM: CHEST - 2 VIEW COMPARISON:  08/17/2017 chest x-ray. FINDINGS: Cardiomegaly with biventricular pacer in place. Pulmonary vascular congestion with cephalization of vasculature suggesting mild pulmonary edema. No segmental infiltrate or pneumothorax. Calcified tortuous aorta. Chronic T10 anterior wedge compression fracture. Schmorl's node deformity superior endplate L2 and L3. IMPRESSION: Pulmonary vascular congestion with cephalization of vasculature suggesting mild pulmonary edema. Cardiomegaly with biventricular pacer in place. Aortic Atherosclerosis (ICD10-I70.0). Chronic T10 anterior wedge compression fracture. Electronically Signed   By: Genia Del M.D.   On: 02/28/2018 08:18    EKG: Independently reviewed.  Personally reviewed EKG and revealed ventricular paced atrial fibrillation rate of 83.  Assessment/Plan Present on Admission: . Acute on chronic diastolic CHF (congestive heart failure) (HCC)  Active  Problems:   Acute on chronic diastolic CHF (congestive heart failure) (HCC)  Acute on chronic diastolic CHF Possibly 2/2 to diet non compliance Trop negative x1 No ischemic changes on EKG Start IV diuretics Monitor I's and O's Monitor daily weight Salt restriction Fluid restriction Restart cardiac meds IV steroids for wheezing DuoNeb every  6 hours and every 2 hours as needed O2 supplementation to maintain O2 saturation 92% or greater  Chronic diastolic heart failure Last 2D echo done on 08/16/2017 revealed LVEF 55-60% with mild hypokinesis in the inferior wall Continue cardiac meds Management as stated above May consider consulting cardiology, if no improvement tomorrow.  Leukocytosis WBC 12.8 Afebrile however patient admits to taking Tylenol daily for the past 3 days Possibly reactive Obtain pro-calcitonin to rule out an active infective process Repeat CBC in the morning  Hypokalemia Potassium 3.3 Replete with p.o. potassium supplement Give IV magnesium 2 grams Repeat BMP and magnesium levels in the morning  Chronic A. fib Rate controlled on atenolol On Coumadin for primary prevention CVA prophylaxis INR 2.8 Pharmacy consulted to manage coumadin. Highly appreciated. Continue to monitor on telemetry  Coronary artery disease status post CABG Continue aspirin Allergic to statin Follow-up with cardiology outpatient  Post AICD Follow-up with cardiology-electrophysiology outpatient  Physical debility PT to evaluate and treat Out of bed to chair as tolerated Fall precautions  Chronic stasis dermatitis Noted Monitor for ulcers/wounds   DVT prophylaxis: Coumadin  Code Status: Full code  Family Communication: Son at bedside  Disposition Plan: Admit to telemetry  Consults called: None  Admission status: Inpatient    Kayleen Memos MD [facility] Pager [phone]  If 7PM-7AM, please contact night-coverage [email or web address] Password  [facility]  02/28/2018, 11:27 AM

## 2018-03-01 ENCOUNTER — Inpatient Hospital Stay (HOSPITAL_COMMUNITY): Payer: Medicare Other

## 2018-03-01 DIAGNOSIS — N179 Acute kidney failure, unspecified: Secondary | ICD-10-CM

## 2018-03-01 DIAGNOSIS — J440 Chronic obstructive pulmonary disease with acute lower respiratory infection: Secondary | ICD-10-CM

## 2018-03-01 DIAGNOSIS — J209 Acute bronchitis, unspecified: Secondary | ICD-10-CM

## 2018-03-01 LAB — BASIC METABOLIC PANEL
Anion gap: 11 (ref 5–15)
BUN: 30 mg/dL — ABNORMAL HIGH (ref 6–20)
CHLORIDE: 102 mmol/L (ref 101–111)
CO2: 26 mmol/L (ref 22–32)
Calcium: 9.7 mg/dL (ref 8.9–10.3)
Creatinine, Ser: 1.36 mg/dL — ABNORMAL HIGH (ref 0.61–1.24)
GFR calc non Af Amer: 44 mL/min — ABNORMAL LOW (ref >60)
GFR, EST AFRICAN AMERICAN: 50 mL/min — AB (ref >60)
Glucose, Bld: 158 mg/dL — ABNORMAL HIGH (ref 65–99)
Potassium: 4.1 mmol/L (ref 3.5–5.1)
Sodium: 139 mmol/L (ref 135–145)

## 2018-03-01 LAB — URINALYSIS, ROUTINE W REFLEX MICROSCOPIC
Bilirubin Urine: NEGATIVE
Glucose, UA: NEGATIVE mg/dL
Hgb urine dipstick: NEGATIVE
Ketones, ur: NEGATIVE mg/dL
LEUKOCYTES UA: NEGATIVE
Nitrite: NEGATIVE
Protein, ur: NEGATIVE mg/dL
SPECIFIC GRAVITY, URINE: 1.014 (ref 1.005–1.030)
pH: 6 (ref 5.0–8.0)

## 2018-03-01 LAB — MAGNESIUM: Magnesium: 2.4 mg/dL (ref 1.7–2.4)

## 2018-03-01 LAB — CBC
HEMATOCRIT: 40.3 % (ref 39.0–52.0)
HEMOGLOBIN: 13.3 g/dL (ref 13.0–17.0)
MCH: 34.1 pg — ABNORMAL HIGH (ref 26.0–34.0)
MCHC: 33 g/dL (ref 30.0–36.0)
MCV: 103.3 fL — ABNORMAL HIGH (ref 78.0–100.0)
Platelets: 221 10*3/uL (ref 150–400)
RBC: 3.9 MIL/uL — ABNORMAL LOW (ref 4.22–5.81)
RDW: 14.6 % (ref 11.5–15.5)
WBC: 20.4 10*3/uL — ABNORMAL HIGH (ref 4.0–10.5)

## 2018-03-01 LAB — PROTIME-INR
INR: 2.25
Prothrombin Time: 24.7 seconds — ABNORMAL HIGH (ref 11.4–15.2)

## 2018-03-01 MED ORDER — METHYLPREDNISOLONE SODIUM SUCC 40 MG IJ SOLR
40.0000 mg | Freq: Two times a day (BID) | INTRAMUSCULAR | Status: DC
  Administered 2018-03-02 – 2018-03-04 (×5): 40 mg via INTRAVENOUS
  Filled 2018-03-01 (×6): qty 1

## 2018-03-01 MED ORDER — BUDESONIDE 0.5 MG/2ML IN SUSP
0.5000 mg | Freq: Two times a day (BID) | RESPIRATORY_TRACT | Status: DC
  Administered 2018-03-01 – 2018-03-04 (×7): 0.5 mg via RESPIRATORY_TRACT
  Filled 2018-03-01 (×7): qty 2

## 2018-03-01 MED ORDER — DEXTROMETHORPHAN POLISTIREX ER 30 MG/5ML PO SUER
30.0000 mg | Freq: Two times a day (BID) | ORAL | Status: DC
  Administered 2018-03-01 – 2018-03-04 (×7): 30 mg via ORAL
  Filled 2018-03-01 (×7): qty 5

## 2018-03-01 MED ORDER — WARFARIN SODIUM 2.5 MG PO TABS
2.5000 mg | ORAL_TABLET | Freq: Once | ORAL | Status: AC
  Administered 2018-03-01: 2.5 mg via ORAL
  Filled 2018-03-01: qty 1

## 2018-03-01 MED ORDER — ARFORMOTEROL TARTRATE 15 MCG/2ML IN NEBU
15.0000 ug | INHALATION_SOLUTION | Freq: Two times a day (BID) | RESPIRATORY_TRACT | Status: DC
  Administered 2018-03-01 – 2018-03-04 (×7): 15 ug via RESPIRATORY_TRACT
  Filled 2018-03-01 (×7): qty 2

## 2018-03-01 NOTE — Evaluation (Signed)
Physical Therapy Evaluation Patient Details Name: [name] MRN: [id number] DOB: Nov 06, 1923 Today's Date: 03/01/2018   History of Present Illness  84 yo male with onset of SOB and cough, chills was admitted through ED and dx acute respiratory failure, atelectasis, vascular congestion and leukocytosis.  PMHx:  CAD, CABG, a-fib, CHF, PVD, GERD,   Clinical Impression  Pt was able to work with PT from being up in chair and working on gait with RW.  Pt is expecting to get from hosp to SNF with time to progress his gait endurance and standing balance along with watching O2 sats drop.  He is improved in quality of breathing from being up to walk and encouraged pt to ask nursing to walk him as well.    Follow Up Recommendations SNF    Equipment Recommendations  Rolling walker with 5" wheels    Recommendations for Other Services       Precautions / Restrictions Precautions Precautions: Fall(telemetry) Restrictions Weight Bearing Restrictions: No      Mobility  Bed Mobility               General bed mobility comments: up in chair when PT arrived  Transfers Overall transfer level: Needs assistance Equipment used: Rolling walker (2 wheeled);1 person hand held assist Transfers: Sit to/from Stand Sit to Stand: Min assist            Ambulation/Gait Ambulation/Gait assistance: Min guard;Min assist Ambulation Distance (Feet): 90 Feet Assistive device: Rolling walker (2 wheeled);1 person hand held assist Gait Pattern/deviations: Step-to pattern;Step-through pattern;Decreased stride length;Narrow base of support;Trunk flexed Gait velocity: reduced Gait velocity interpretation: <1.8 ft/sec, indicate of risk for recurrent falls General Gait Details: short steps with slow pace, cued his speed to manage O2 sats which continually fluctuate with gait  Stairs            Wheelchair Mobility    Modified Rankin (Stroke Patients Only)       Balance Overall balance  assessment: Needs assistance Sitting-balance support: Feet supported Sitting balance-Leahy Scale: Fair     Standing balance support: Bilateral upper extremity supported;During functional activity Standing balance-Leahy Scale: Poor                               Pertinent Vitals/Pain Pain Assessment: No/denies pain    Home Living Family/patient expects to be discharged to:: Private residence Living Arrangements: Alone Available Help at Discharge: Family;Available PRN/intermittently Type of Home: House Home Access: Ramped entrance     Home Layout: One level Home Equipment: Grab bars - toilet;Grab bars - tub/shower;Walker - 2 wheels;Walker - 4 wheels;Tub bench Additional Comments: has a sock aide, reacher    Prior Function Level of Independence: Needs assistance   Gait / Transfers Assistance Needed: sleeps sitting up  ADL's / Homemaking Assistance Needed: has house hold cleaning and cooking help  Comments: Uses RW for functional mobility      Hand Dominance   Dominant Hand: Right    Extremity/Trunk Assessment   Upper Extremity Assessment Upper Extremity Assessment: Overall WFL for tasks assessed    Lower Extremity Assessment Lower Extremity Assessment: Generalized weakness    Cervical / Trunk Assessment Cervical / Trunk Assessment: Kyphotic  Communication   Communication: HOH  Cognition Arousal/Alertness: Awake/alert Behavior During Therapy: WFL for tasks assessed/performed Overall Cognitive Status: Within Functional Limits for tasks assessed  General Comments      Exercises     Assessment/Plan    PT Assessment Patient needs continued PT services  PT Problem List Decreased strength;Decreased range of motion;Decreased activity tolerance;Decreased balance;Decreased mobility;Decreased coordination;Decreased cognition;Decreased knowledge of use of DME;Decreased safety awareness;Cardiopulmonary  status limiting activity;Obesity       PT Treatment Interventions DME instruction;Gait training;Functional mobility training;Therapeutic activities;Therapeutic exercise;Neuromuscular re-education;Balance training;Patient/family education    PT Goals (Current goals can be found in the Care Plan section)  Acute Rehab PT Goals Patient Stated Goal: none stated PT Goal Formulation: With patient/family Time For Goal Achievement: 03/15/18 Potential to Achieve Goals: Good    Frequency Min 2X/week   Barriers to discharge Decreased caregiver support home alone the majority of the day    Co-evaluation               AM-PAC PT "6 Clicks" Daily Activity  Outcome Measure Difficulty turning over in bed (including adjusting bedclothes, sheets and blankets)?: A Little Difficulty moving from lying on back to sitting on the side of the bed? : A Lot Difficulty sitting down on and standing up from a chair with arms (e.g., wheelchair, bedside commode, etc,.)?: Unable Help needed moving to and from a bed to chair (including a wheelchair)?: A Little Help needed walking in hospital room?: A Little Help needed climbing 3-5 steps with a railing? : A Little 6 Click Score: 15    End of Session Equipment Utilized During Treatment: Gait belt Activity Tolerance: Patient tolerated treatment well;Treatment limited secondary to medical complications (Comment);Other (comment)(O2 sats drop with mobility) Patient left: in chair;with call bell/phone within reach;with chair alarm set;with family/visitor present Nurse Communication: Mobility status PT Visit Diagnosis: Unsteadiness on feet (R26.81);Muscle weakness (generalized) (M62.81);Adult, failure to thrive (R62.7);Difficulty in walking, not elsewhere classified (R26.2)    Time: 0093-8182 PT Time Calculation (min) (ACUTE ONLY): 29 min   Charges:   PT Evaluation $PT Eval Moderate Complexity: 1 Mod PT Treatments $Gait Training: 8-22 mins   PT G Codes:    PT G-Codes **NOT FOR INPATIENT CLASS** Functional Assessment Tool Used: AM-PAC 6 Clicks Basic Mobility   Ramond Dial 03/01/2018, 9:11 PM   Mee Hives, PT MS Acute Rehab Dept. Number: [place]

## 2018-03-01 NOTE — Progress Notes (Addendum)
PROGRESS NOTE [facility]   [name]   NWG:[id number] DOB: 1923-04-13  DOA: 02/28/2018 PCP: Lavone Orn, MD   Brief Narrative:  [name] is a 84 year old male with medical history significant for coronary artery disease, status post CABG, atrial fibrillation, CHF, PVD and GERD.  Patient presented to the emergency department complaining of  shortness of breath for 3 days prior to admission, associated symptoms included nonproductive cough and intermittent chills.  Upon ED evaluation chest x-ray shows increasing vascular congestion, cardiomegaly on possible right middle lobe infiltrate versus atelectasis.  Afebrile, mild leukocytosis and hypokalemia.  Patient was admitted with working diagnosis of acute respiratory failure.  Subjective: Patient seen and examined, reported his breathing is significantly better from yesterday although continues to have cough and difficulty speaking in full sentences.  Denies chest pain, palpitations and weakness.  Assessment & Plan: Acute respiratory failure with hypoxia Multifactorial, likely a combination of CHF with bronchitis/undiagnosed COPD Wean oxygen as able, treat underlying causes  Bronchitis/undiagnosed COPD Patient was prescribed albuterol not too long ago and was using a as needed. Chest x-ray shows increasing vascular congestion however on exam he seems to be euvolemic, with diffuse rhonchi and wheezing.  Will obtain CT chest.  Will add Brovana and Pulmicort, continue albuterol as needed.  Continue Solu-Medrol 40 mg twice daily taper as able.  Pro-calcitonin negative no need for antibiotic at this time.  WBC elevated likely from high-dose steroids.  If he spiked fever obtain blood cultures and start empiric antibiotics. Out of bed as tolerated  Mild acute on chronic diastolic heart failure Patient was with fluid overload upon admission, he was treated with IV Lasix and then home diuretics were resumed.  On my exam today he  seems euvolemic, will continue home dose Lasix and hold on IV diuretics.  His creatinine increased slightly.  Monitor urine output and daily weights.  Chronic A. fib Rate controlled On warfarin for anticoagulation Continue to monitor telemetry  Hypokalemia Replete Check BMP and magnesium in a.m.  CAD status post CABG Continue aspirin Follow-up as an outpatient   DVT prophylaxis: Warfarin Code Status: Full code Family Communication: None at bedside Disposition Plan: Home when respiratory status improved   Consultants:   None  Procedures:   None  Antimicrobials:  None   Objective: Vitals:   03/01/18 0446 03/01/18 0736 03/01/18 1115 03/01/18 1254  BP: 132/67  134/67 (!) 131/58  Pulse: 67  68 64  Resp: 20   20  Temp: 98.1 F (36.7 C)   98.2 F (36.8 C)  TempSrc: Oral   Oral  SpO2: 94% 94%  94%  Weight: 82.7 kg (182 lb 5.1 oz)     Height:        Intake/Output Summary (Last 24 hours) at 03/01/2018 1808 Last data filed at 03/01/2018 1121 Gross per 24 hour  Intake 820 ml  Output 1025 ml  Net -205 ml   Filed Weights   02/28/18 0647 02/28/18 1600 03/01/18 0446  Weight: 83 kg (183 lb) 83.5 kg (184 lb 1.4 oz) 82.7 kg (182 lb 5.1 oz)    Examination:  General exam: Appears calm and comfortable  HEENT: OP moist and clear Respiratory system: Tachypneic, diffuse rhonchi and wheezing bilaterally.  Mild bibasilar crackles. Cardiovascular system: S1 & S2 heard, RRR. + Systolic murmur Gastrointestinal system: Abdomen is nondistended, soft and nontender.  Central nervous system: Alert and oriented. No focal neurological deficits. Extremities: Bilateral lower extremity edema 1+ Skin: No rashes Psychiatry: Mood &  affect appropriate.    Data Reviewed: I have personally reviewed following labs and imaging studies  CBC: Recent Labs  Lab 02/28/18 0727 03/01/18 0522  WBC 12.8* 20.4*  HGB 13.3 13.3  HCT 40.0 40.3  MCV 104.4* 103.3*  PLT 206 409   Basic  Metabolic Panel: Recent Labs  Lab 02/28/18 0727 03/01/18 0522  NA 142 139  K 3.3* 4.1  CL 101 102  CO2 26 26  GLUCOSE 123* 158*  BUN 22* 30*  CREATININE 1.13 1.36*  CALCIUM 9.5 9.7  MG  --  2.4   GFR: Estimated Creatinine Clearance: 35 mL/min (A) (by C-G formula based on SCr of 1.36 mg/dL (H)). Liver Function Tests: Recent Labs  Lab 02/28/18 0727  AST 32  ALT 22  ALKPHOS 100  BILITOT 0.8  PROT 7.7  ALBUMIN 3.2*   No results for input(s): LIPASE, AMYLASE in the last 168 hours. No results for input(s): AMMONIA in the last 168 hours. Coagulation Profile: Recent Labs  Lab 02/28/18 0901 03/01/18 0522  INR 2.83 2.25   Cardiac Enzymes: Recent Labs  Lab 02/28/18 0727  TROPONINI <0.03   BNP (last 3 results) No results for input(s): PROBNP in the last 8760 hours. HbA1C: No results for input(s): HGBA1C in the last 72 hours. CBG: No results for input(s): GLUCAP in the last 168 hours. Lipid Profile: No results for input(s): CHOL, HDL, LDLCALC, TRIG, CHOLHDL, LDLDIRECT in the last 72 hours. Thyroid Function Tests: No results for input(s): TSH, T4TOTAL, FREET4, T3FREE, THYROIDAB in the last 72 hours. Anemia Panel: No results for input(s): VITAMINB12, FOLATE, FERRITIN, TIBC, IRON, RETICCTPCT in the last 72 hours. Sepsis Labs: Recent Labs  Lab 02/28/18 0727  PROCALCITON <0.10    No results found for this or any previous visit (from the past 240 hour(s)).    Radiology Studies: Dg Chest 2 View  Result Date: 02/28/2018 CLINICAL DATA:  84 year old male with increased shortness of breath. History of congestive heart failure. Initial encounter. EXAM: CHEST - 2 VIEW COMPARISON:  08/17/2017 chest x-ray. FINDINGS: Cardiomegaly with biventricular pacer in place. Pulmonary vascular congestion with cephalization of vasculature suggesting mild pulmonary edema. No segmental infiltrate or pneumothorax. Calcified tortuous aorta. Chronic T10 anterior wedge compression fracture.  Schmorl's node deformity superior endplate L2 and L3. IMPRESSION: Pulmonary vascular congestion with cephalization of vasculature suggesting mild pulmonary edema. Cardiomegaly with biventricular pacer in place. Aortic Atherosclerosis (ICD10-I70.0). Chronic T10 anterior wedge compression fracture. Electronically Signed   By: Genia Del M.D.   On: 02/28/2018 08:18   Ct Chest Wo Contrast  Result Date: 03/01/2018 CLINICAL DATA:  Gradually worsening shortness of breath for 3 days, history coronary artery disease post MI and CABG, chronic diastolic heart failure, atrial fibrillation, EXAM: CT CHEST WITHOUT CONTRAST TECHNIQUE: Multidetector CT imaging of the chest was performed following the standard protocol without IV contrast. Sagittal and coronal MPR images reconstructed from axial data set. COMPARISON:  PET-CT 01/16/2011 FINDINGS: Cardiovascular: Pacemaker leads RIGHT atrium and RIGHT ventricle. Atherosclerotic calcifications aorta, proximal great vessels and coronary arteries. Aorta normal caliber. Enlargement of cardiac chambers. No pericardial effusion. Mediastinum/Nodes: Esophagus unremarkable. Base of cervical region normal appearance. Scattered normal size mediastinal lymph nodes. No thoracic adenopathy. Lungs/Pleura: Calcified granulomata LEFT lower lobe. Minimal bibasilar atelectasis. Central peribronchial thickening. No pulmonary infiltrate, pleural effusion or pneumothorax. No additional pulmonary mass. Upper Abdomen: Cyst at upper pole LEFT kidney 3.2 x 2.9 cm image 141. Probable additional small cyst at medial LEFT kidney on last image, incompletely visualized but  present on prior PET-CT. Remaining visualized upper abdomen unremarkable. Musculoskeletal: Osseous demineralization with chronic compression deformity of a lower thoracic vertebra approximately T8. Prior median sternotomy. Scattered degenerative disc disease changes thoracic spine. Superior endplate concavity lower thoracic spine at  approximately T10. IMPRESSION: Extensive atherosclerotic disease calcifications. Old granulomatous disease RIGHT lower lobe with mild bibasilar atelectasis. No acute intrathoracic abnormalities. LEFT renal cysts. Aortic Atherosclerosis (ICD10-I70.0). Electronically Signed   By: Lavonia Dana M.D.   On: 03/01/2018 12:29      Scheduled Meds: . albuterol  5 mg Nebulization Once  . allopurinol  300 mg Oral Daily  . arformoterol  15 mcg Nebulization BID  . aspirin EC  81 mg Oral QODAY  . atenolol  50 mg Oral Daily  . budesonide (PULMICORT) nebulizer solution  0.5 mg Nebulization BID  . cholecalciferol  4,000 Units Oral Daily  . dextromethorphan  30 mg Oral BID  . furosemide  40 mg Oral BID  . gabapentin  100 mg Oral 3 times per day  . ipratropium-albuterol  3 mL Nebulization BID  . methylPREDNISolone (SOLU-MEDROL) injection  40 mg Intravenous TID  . senna-docusate  2 tablet Oral QHS  . warfarin  2.5 mg Oral ONCE-1800  . Warfarin - Pharmacist Dosing Inpatient   Does not apply q1800   Continuous Infusions:   LOS: 1 day    Time spent: Total of 35 minutes spent with pt, greater than 50% of which was spent in discussion of  treatment, counseling and coordination of care  Chipper Oman, MD Pager: Text Page via [email or web address]   If 7PM-7AM, please contact night-coverage [email or web address] 03/01/2018, 6:08 PM   Note - This record has been created using [employer]. Chart creation errors have been sought, but may not always have been located. Such creation errors do not reflect on the standard of medical care.

## 2018-03-01 NOTE — Progress Notes (Addendum)
[place] for warfarin Indication: atrial fibrillation  Allergies  Allergen Reactions  . Avelox [Moxifloxacin Hcl In Nacl] Other (See Comments)    Messed up lungs  . Indomethacin Other (See Comments)    Renal insufficiency  . Levaquin [Levofloxacin] Other (See Comments)    Messed up lungs  . Lipitor [Atorvastatin] Nausea Only, Palpitations and Other (See Comments)    Irregular heart beat also  . Moxifloxacin Other (See Comments)    Respiratory distress  . Pravachol [Pravastatin Sodium] Nausea Only, Palpitations and Other (See Comments)  . Aleve [Naproxen Sodium] Nausea And Vomiting and Other (See Comments)    GI Upset  . Norpace [Disopyramide Phosphate] Other (See Comments)    Urinary retention  . Disopyramide Other (See Comments)    Urinary retention  . Morphine Nausea Only    Irregular hear beat  . Pravastatin Nausea Only    Irregular heart beat  . Amlodipine Besylate Rash  . Azithromycin Rash  . Bactrim [Sulfamethoxazole-Trimethoprim] Rash  . Clinoril [Sulindac] Rash  . Codeine Other (See Comments)    unknown  . Digoxin Rash  . Lorazepam Other (See Comments)    unknown  . Morphine And Related Other (See Comments)    unknown  . Other Other (See Comments)    Tripilenomenon? Per paperwork  . Oxycodone-Acetaminophen Rash  . Percocet [Oxycodone-Acetaminophen] Rash  . Sulfamethoxazole-Trimethoprim Rash  . Sulfonamide Derivatives Rash  . Uloric [Febuxostat] Other (See Comments)    unknown    Patient Measurements: Height: 5\' 6"  (167.6 cm) Weight: 182 lb 5.1 oz (82.7 kg) IBW/kg (Calculated) : 63.8  Vital Signs: Temp: 98.1 F (36.7 C) (04/23 0446) Temp Source: Oral (04/23 0446) BP: 132/67 (04/23 0446) Pulse Rate: 67 (04/23 0446)  Labs: Recent Labs    02/28/18 0727 02/28/18 0901 03/01/18 0522  HGB 13.3  --  13.3  HCT 40.0  --  40.3  PLT 206  --  221  LABPROT  --  29.5* 24.7*  INR  --  2.83 2.25  CREATININE 1.13  --   1.36*  TROPONINI <0.03  --   --     Estimated Creatinine Clearance: 35 mL/min (A) (by C-G formula based on SCr of 1.36 mg/dL (H)).   Medical History: Past Medical History:  Diagnosis Date  . Arrhythmia    afib  . Arthritis    "back and hands" (09/26/2015)  . Basal cell carcinoma   . CHF (congestive heart failure) ([place])   . Coronary artery disease   . CVI (common variable immunodeficiency) ([place])   . DJD (degenerative joint disease)   . ED (erectile dysfunction)   . GERD (gastroesophageal reflux disease)   . Gout   . Heart attack ([place]) 1986  . Hyperlipidemia   . Hypertension   . IFG (impaired fasting glucose)   . Neuropathy   . Non Hodgkin's lymphoma ([place]) dx'd 2006  . PAC (premature atrial contraction)   . PE (pulmonary embolism) 2007   "when I was taking chemo"  . Peptic ulcer disease   . Pneumonia ~ 2007 X 2  . Squamous carcinoma   . Venous stasis ulcers (HCC)    Assessment: 89 YOM on warfarin prior to admission for h/o atrial fibrillation (remote h/o PE).  INR therapeutic at admission.  Patient reports taking warfarin 2.5mg  PO daily (Last dose 4/21).  Today, 03/01/2018  INR = 2.25, down from yesterday, but remains within therapeutic range  CBC: Hgb and pltc WNL  Drug-Drug interactions: steroids  may increase effects of warfarin  Goal of Therapy:  INR 2-3  Plan:   Give home dose of warfarin 2.5mg  PO x 1 tonight  Follow INR trend closely  Daily PT/INR  Monitor closely for s/sx of bleeding   Lindell Spar, PharmD, BCPS Pager: [phone] 03/01/2018 8:18 AM

## 2018-03-02 DIAGNOSIS — R0600 Dyspnea, unspecified: Secondary | ICD-10-CM

## 2018-03-02 DIAGNOSIS — I5033 Acute on chronic diastolic (congestive) heart failure: Secondary | ICD-10-CM

## 2018-03-02 LAB — BASIC METABOLIC PANEL
ANION GAP: 12 (ref 5–15)
BUN: 41 mg/dL — ABNORMAL HIGH (ref 6–20)
CALCIUM: 9.8 mg/dL (ref 8.9–10.3)
CHLORIDE: 100 mmol/L — AB (ref 101–111)
CO2: 27 mmol/L (ref 22–32)
CREATININE: 1.22 mg/dL (ref 0.61–1.24)
GFR calc non Af Amer: 50 mL/min — ABNORMAL LOW (ref >60)
GFR, EST AFRICAN AMERICAN: 57 mL/min — AB (ref >60)
Glucose, Bld: 133 mg/dL — ABNORMAL HIGH (ref 65–99)
Potassium: 4 mmol/L (ref 3.5–5.1)
SODIUM: 139 mmol/L (ref 135–145)

## 2018-03-02 LAB — CBC WITH DIFFERENTIAL/PLATELET
BASOS PCT: 0 %
Basophils Absolute: 0 10*3/uL (ref 0.0–0.1)
EOS ABS: 0 10*3/uL (ref 0.0–0.7)
Eosinophils Relative: 0 %
HEMATOCRIT: 43 % (ref 39.0–52.0)
HEMOGLOBIN: 14.1 g/dL (ref 13.0–17.0)
LYMPHS ABS: 1.2 10*3/uL (ref 0.7–4.0)
Lymphocytes Relative: 5 %
MCH: 33.8 pg (ref 26.0–34.0)
MCHC: 32.8 g/dL (ref 30.0–36.0)
MCV: 103.1 fL — ABNORMAL HIGH (ref 78.0–100.0)
MONOS PCT: 3 %
Monocytes Absolute: 0.7 10*3/uL (ref 0.1–1.0)
NEUTROS ABS: 22.5 10*3/uL — AB (ref 1.7–7.7)
NEUTROS PCT: 92 %
Platelets: 271 10*3/uL (ref 150–400)
RBC: 4.17 MIL/uL — AB (ref 4.22–5.81)
RDW: 14.5 % (ref 11.5–15.5)
WBC: 24.4 10*3/uL — AB (ref 4.0–10.5)

## 2018-03-02 LAB — MAGNESIUM: MAGNESIUM: 2.3 mg/dL (ref 1.7–2.4)

## 2018-03-02 LAB — PROTIME-INR
INR: 2.74
Prothrombin Time: 28.8 seconds — ABNORMAL HIGH (ref 11.4–15.2)

## 2018-03-02 MED ORDER — POLYVINYL ALCOHOL 1.4 % OP SOLN
1.0000 [drp] | OPHTHALMIC | Status: DC | PRN
  Administered 2018-03-02: 1 [drp] via OPHTHALMIC
  Filled 2018-03-02 (×2): qty 15

## 2018-03-02 MED ORDER — DOXYCYCLINE HYCLATE 100 MG PO TABS
100.0000 mg | ORAL_TABLET | Freq: Two times a day (BID) | ORAL | Status: DC
  Administered 2018-03-02 – 2018-03-04 (×5): 100 mg via ORAL
  Filled 2018-03-02 (×5): qty 1

## 2018-03-02 MED ORDER — IPRATROPIUM-ALBUTEROL 0.5-2.5 (3) MG/3ML IN SOLN
3.0000 mL | Freq: Three times a day (TID) | RESPIRATORY_TRACT | Status: DC
  Administered 2018-03-02 – 2018-03-03 (×2): 3 mL via RESPIRATORY_TRACT
  Filled 2018-03-02 (×2): qty 3

## 2018-03-02 MED ORDER — IPRATROPIUM-ALBUTEROL 0.5-2.5 (3) MG/3ML IN SOLN
3.0000 mL | Freq: Four times a day (QID) | RESPIRATORY_TRACT | Status: DC
  Administered 2018-03-02: 3 mL via RESPIRATORY_TRACT
  Filled 2018-03-02: qty 3

## 2018-03-02 MED ORDER — WARFARIN SODIUM 2.5 MG PO TABS
2.5000 mg | ORAL_TABLET | Freq: Once | ORAL | Status: AC
  Administered 2018-03-02: 2.5 mg via ORAL
  Filled 2018-03-02: qty 1

## 2018-03-02 NOTE — Progress Notes (Signed)
PROGRESS NOTE    [name]  HEN:[id number] DOB: 11/05/1923 DOA: 02/28/2018 PCP: Lavone Orn, MD   Brief Narrative: [name] is a 84 year old male with medical history significant for coronary artery disease, status post CABG, atrial fibrillation, CHF, PVD and GERD.  Patient presented to the emergency department complaining of  shortness of breath for 3 days prior to admission, associated symptoms included nonproductive cough and intermittent chills.  Upon ED evaluation chest x-ray shows increasing vascular congestion, cardiomegaly on possible right middle lobe infiltrate versus atelectasis.  Afebrile, mild leukocytosis and hypokalemia.  Patient was admitted with working diagnosis of acute respiratory failure.    Assessment & Plan:   Active Problems:   Acute on chronic diastolic CHF (congestive heart failure) (HCC)   Acute respiratory failure with hypoxia secondary to copd and CHF exacerbation>  Plan to wean him off oxygen as appropriate.    Acute COPD exacerbation: CT chest does not show pneumonia or masses. Old granulomatous disease RIGHT lower lobe with mild bibasilar atelectasis. Continue with duonebs , brovana and pulmicort.  Resume IV steroids.   Resume lasix and monitor urine output.  Strict intake and output.  He put out 1150 ml urine in the last 24 hours.    chronic atrial fibrillation:  Rate controlled.  Resume atenolol and coumadin dosing as per pharmacy.    Hypokalemia: Replaced.   CAD S/P cabg No chest pain , resume aspirin.       DVT prophylaxis: coumadin.  Code Status:full code.  Family Communication: none at bedside.  Disposition Plan: possibly d/c in 1 to 2 days.    Consultants:   None.    Procedures: (none.    Antimicrobials: none.    Subjective: Reports feeling better .   Objective: Vitals:   03/02/18 0433 03/02/18 0929 03/02/18 1325 03/02/18 1453  BP: 130/76  134/79   Pulse: 68  67   Resp: (!) 22  20   Temp:  97.7 F (36.5 C)  (!) 97.5 F (36.4 C)   TempSrc: Oral  Oral   SpO2: 99% 99% 97% 97%  Weight: 82.7 kg (182 lb 5.1 oz)     Height:        Intake/Output Summary (Last 24 hours) at 03/02/2018 1613 Last data filed at 03/02/2018 1326 Gross per 24 hour  Intake 840 ml  Output 1150 ml  Net -310 ml   Filed Weights   02/28/18 1600 03/01/18 0446 03/02/18 0433  Weight: 83.5 kg (184 lb 1.4 oz) 82.7 kg (182 lb 5.1 oz) 82.7 kg (182 lb 5.1 oz)    Examination:  General exam: Appears calm and comfortable  Respiratory system: Clear to auscultation. Respiratory effort normal. Cardiovascular system: S1 & S2 heard, RRR. No JVD, murmurs, rubs, gallops or clicks. No pedal edema. Gastrointestinal system: Abdomen is nondistended, soft and nontender. No organomegaly or masses felt. Normal bowel sounds heard. Central nervous system: Alert and oriented. No focal neurological deficits. Extremities: trace pedal edema.  Skin: No rashes, lesions or ulcers Psychiatry:  Mood & affect appropriate.     Data Reviewed: I have personally reviewed following labs and imaging studies  CBC: Recent Labs  Lab 02/28/18 0727 03/01/18 0522 03/02/18 0809  WBC 12.8* 20.4* 24.4*  NEUTROABS  --   --  22.5*  HGB 13.3 13.3 14.1  HCT 40.0 40.3 43.0  MCV 104.4* 103.3* 103.1*  PLT 206 221 361   Basic Metabolic Panel: Recent Labs  Lab 02/28/18 0727 03/01/18 0522 03/02/18 0809  NA  142 139 139  K 3.3* 4.1 4.0  CL 101 102 100*  CO2 26 26 27   GLUCOSE 123* 158* 133*  BUN 22* 30* 41*  CREATININE 1.13 1.36* 1.22  CALCIUM 9.5 9.7 9.8  MG  --  2.4 2.3   GFR: Estimated Creatinine Clearance: 39 mL/min (by C-G formula based on SCr of 1.22 mg/dL). Liver Function Tests: Recent Labs  Lab 02/28/18 0727  AST 32  ALT 22  ALKPHOS 100  BILITOT 0.8  PROT 7.7  ALBUMIN 3.2*   No results for input(s): LIPASE, AMYLASE in the last 168 hours. No results for input(s): AMMONIA in the last 168 hours. Coagulation  Profile: Recent Labs  Lab 02/28/18 0901 03/01/18 0522 03/02/18 0809  INR 2.83 2.25 2.74   Cardiac Enzymes: Recent Labs  Lab 02/28/18 0727  TROPONINI <0.03   BNP (last 3 results) No results for input(s): PROBNP in the last 8760 hours. HbA1C: No results for input(s): HGBA1C in the last 72 hours. CBG: No results for input(s): GLUCAP in the last 168 hours. Lipid Profile: No results for input(s): CHOL, HDL, LDLCALC, TRIG, CHOLHDL, LDLDIRECT in the last 72 hours. Thyroid Function Tests: No results for input(s): TSH, T4TOTAL, FREET4, T3FREE, THYROIDAB in the last 72 hours. Anemia Panel: No results for input(s): VITAMINB12, FOLATE, FERRITIN, TIBC, IRON, RETICCTPCT in the last 72 hours. Sepsis Labs: Recent Labs  Lab 02/28/18 0727  PROCALCITON <0.10    No results found for this or any previous visit (from the past 240 hour(s)).       Radiology Studies: Ct Chest Wo Contrast  Result Date: 03/01/2018 CLINICAL DATA:  Gradually worsening shortness of breath for 3 days, history coronary artery disease post MI and CABG, chronic diastolic heart failure, atrial fibrillation, EXAM: CT CHEST WITHOUT CONTRAST TECHNIQUE: Multidetector CT imaging of the chest was performed following the standard protocol without IV contrast. Sagittal and coronal MPR images reconstructed from axial data set. COMPARISON:  PET-CT 01/16/2011 FINDINGS: Cardiovascular: Pacemaker leads RIGHT atrium and RIGHT ventricle. Atherosclerotic calcifications aorta, proximal great vessels and coronary arteries. Aorta normal caliber. Enlargement of cardiac chambers. No pericardial effusion. Mediastinum/Nodes: Esophagus unremarkable. Base of cervical region normal appearance. Scattered normal size mediastinal lymph nodes. No thoracic adenopathy. Lungs/Pleura: Calcified granulomata LEFT lower lobe. Minimal bibasilar atelectasis. Central peribronchial thickening. No pulmonary infiltrate, pleural effusion or pneumothorax. No additional  pulmonary mass. Upper Abdomen: Cyst at upper pole LEFT kidney 3.2 x 2.9 cm image 141. Probable additional small cyst at medial LEFT kidney on last image, incompletely visualized but present on prior PET-CT. Remaining visualized upper abdomen unremarkable. Musculoskeletal: Osseous demineralization with chronic compression deformity of a lower thoracic vertebra approximately T8. Prior median sternotomy. Scattered degenerative disc disease changes thoracic spine. Superior endplate concavity lower thoracic spine at approximately T10. IMPRESSION: Extensive atherosclerotic disease calcifications. Old granulomatous disease RIGHT lower lobe with mild bibasilar atelectasis. No acute intrathoracic abnormalities. LEFT renal cysts. Aortic Atherosclerosis (ICD10-I70.0). Electronically Signed   By: Lavonia Dana M.D.   On: 03/01/2018 12:29        Scheduled Meds: . albuterol  5 mg Nebulization Once  . allopurinol  300 mg Oral Daily  . arformoterol  15 mcg Nebulization BID  . aspirin EC  81 mg Oral QODAY  . atenolol  50 mg Oral Daily  . budesonide (PULMICORT) nebulizer solution  0.5 mg Nebulization BID  . cholecalciferol  4,000 Units Oral Daily  . dextromethorphan  30 mg Oral BID  . doxycycline  100 mg Oral Q12H  .  furosemide  40 mg Oral BID  . gabapentin  100 mg Oral 3 times per day  . ipratropium-albuterol  3 mL Nebulization TID  . methylPREDNISolone (SOLU-MEDROL) injection  40 mg Intravenous Q12H  . senna-docusate  2 tablet Oral QHS  . warfarin  2.5 mg Oral ONCE-1800  . Warfarin - Pharmacist Dosing Inpatient   Does not apply q1800   Continuous Infusions:   LOS: 2 days    Time spent: 35 minutes    Hosie Poisson, MD [facility] Pager [phone]  If 7PM-7AM, please contact night-coverage [email or web address] Password [facility] 03/02/2018, 4:13 PM

## 2018-03-02 NOTE — Progress Notes (Addendum)
ANTICOAGULATION CONSULT NOTE  Pharmacy Consult for warfarin Indication: atrial fibrillation  Patient Measurements: Height: 5\' 6"  (167.6 cm) Weight: 182 lb 5.1 oz (82.7 kg) IBW/kg (Calculated) : 63.8  Vital Signs: Temp: 97.7 F (36.5 C) (04/24 0433) Temp Source: Oral (04/24 0433) BP: 130/76 (04/24 0433) Pulse Rate: 68 (04/24 0433)  Labs: Recent Labs    02/28/18 0727 02/28/18 0901 03/01/18 0522 03/02/18 0809  HGB 13.3  --  13.3 14.1  HCT 40.0  --  40.3 43.0  PLT 206  --  221 271  LABPROT  --  29.5* 24.7* 28.8*  INR  --  2.83 2.25 2.74  CREATININE 1.13  --  1.36* 1.22  TROPONINI <0.03  --   --   --    Estimated Creatinine Clearance: 39 mL/min (by C-G formula based on SCr of 1.22 mg/dL).  Medical History: Past Medical History:  Diagnosis Date  . Arrhythmia    afib  . Arthritis    "back and hands" (09/26/2015)  . Basal cell carcinoma   . CHF (congestive heart failure) ([place])   . Coronary artery disease   . CVI (common variable immunodeficiency) ([place])   . DJD (degenerative joint disease)   . ED (erectile dysfunction)   . GERD (gastroesophageal reflux disease)   . Gout   . Heart attack ([place]) 1986  . Hyperlipidemia   . Hypertension   . IFG (impaired fasting glucose)   . Neuropathy   . Non Hodgkin's lymphoma ([place]) dx'd 2006  . PAC (premature atrial contraction)   . PE (pulmonary embolism) 2007   "when I was taking chemo"  . Peptic ulcer disease   . Pneumonia ~ 2007 X 2  . Squamous carcinoma   . Venous stasis ulcers (HCC)    Assessment: 89 YOM on warfarin prior to admission for h/o atrial fibrillation (remote h/o PE).  INR therapeutic at admission.  Patient reports taking warfarin 2.5mg  PO daily (Last dose 4/21).  Today, 03/02/2018  INR 2.74, patient eating well  CBC: Hgb and pltc WNL  Drug-Drug interactions: steroids, Allopurinol, Doxycycline may increase effects of warfarin  Goal of Therapy:  INR 2-3  Plan:   Give home dose of warfarin 2.5mg  PO x 1  tonight  Follow INR trend closely  Daily PT/INR  Monitor closely for s/sx of bleeding  Minda Ditto PharmD Pager [phone] 03/02/2018, 11:54 AM

## 2018-03-03 LAB — PROTIME-INR
INR: 3.24
PROTHROMBIN TIME: 32.8 s — AB (ref 11.4–15.2)

## 2018-03-03 LAB — URINE CULTURE: Culture: <10000 — AB

## 2018-03-03 MED ORDER — IPRATROPIUM-ALBUTEROL 0.5-2.5 (3) MG/3ML IN SOLN
3.0000 mL | Freq: Two times a day (BID) | RESPIRATORY_TRACT | Status: DC
  Administered 2018-03-03 – 2018-03-04 (×2): 3 mL via RESPIRATORY_TRACT
  Filled 2018-03-03 (×2): qty 3

## 2018-03-03 NOTE — Care Management Note (Signed)
Case Management Note  Patient Details  Name: GUADALUPE NICKLESS MRN: [id number] Date of Birth: Mar 12, 1923  Subjective/Objective:   Patient declined SNF-patient chose [facility] rep Jerm[place] aware of HHRN/PT/OT/aide/social worker.Await HHC orders,& face to face.                 Action/Plan:d/c home w/HHC.   Expected Discharge Date:  (unknown)               Expected Discharge Plan:  [place]  In-House Referral:     Discharge planning Services  CM Consult  Post Acute Care Choice:  Durable Medical Equipment(Has rw,w/c) Choice offered to:  Patient  DME Arranged:    DME Agency:     HH Arranged:  RN, PT, OT, Nurse's Aide, Social Work, Refused SNF [place] Agency:  [place]  Status of Service:  In process, will continue to follow  If discussed at Long Length of Stay Meetings, dates discussed:    Additional Comments:  Dessa Phi, RN 03/03/2018, 2:09 PM

## 2018-03-03 NOTE — Clinical Social Work Note (Signed)
Clinical Social Work Assessment  Patient Details  Name: [name] MRN: [id number] Date of Birth: 1922/12/14  Date of referral:  03/03/18               Reason for consult:  Facility Placement, Discharge Planning                Permission sought to share information with:  Family Supports Permission granted to share information::  Yes, Verbal Permission Granted  Name::     [name]  Agency::     Relationship::  son  Contact Information:  [phone]  Housing/Transportation Living arrangements for the past 2 months:  [place] of Information:  Patient, Adult Children Patient Interpreter Needed:  None Criminal Activity/Legal Involvement Pertinent to Current Situation/Hospitalization:  No - Comment as needed Significant Relationships:  Adult Children Lives with:  Self Do you feel safe going back to the place where you live?  Yes(PT recommending SNF) Need for family participation in patient care:  No (Coment)  Care giving concerns:  Patient from home alone. Patient reported that his niece stays next to him and comes over daily to assist with housekeeping and cooking. Patient reported that he uses a walker and power wheelchair at home. Patient reported that he takes a shower when someone is in the home with him. PT is recommending SNF for ST rehab.   Social Worker assessment / plan:  CSW spoke with patient at bedside regarding PT recommendation for SNF for ST rehab. Patient reported that he has been to [facility] 3x in the past and he is not interested in going back to SNF for rehab. Patient reported that he plans to return home and that he is interested in home health services. CSW inquired about patient's children involvement with discharge planning. Patient granted CSW verbal permission to contact his son. CSW called patient's son [name]) and placed him on speakerphone. CSW informed patient's son that patient plans to discharge home with home  health. Patient's son verbalized concerns about patient going home versus rehab because patient leaves alone. Patient informed son that he is going home. CSW inquired about patient hiring someone to stay with him, patient reported that it might be a possibility. Patient's son reported that their was no reason for him to stay on the phone and hung up. Patient reported that he will go home with home health services.  Employment status:  Retired Nurse, adult PT Recommendations:  [place] / Referral to community resources:  Other (Comment Required)(RNCM referral for home health services)  Patient/Family's Response to care:  Patient appreciative of CSW assistacne with discharge planning. Patient declined SNF and verbalized plan to dc home with home health services.  Patient/Family's Understanding of and Emotional Response to Diagnosis, Current Treatment, and Prognosis:  Patient presented calm and verbalized understanding of PT recommendation for SNF for ST rehab. Patient informed CSW about his previous SNF experiences and reported that he feels more comfortable going home. Patient reported that he has a niece that stays next door that comes over daily and he is agreeable to home health services. CSW and patient discussed patient's son's concerns, patient acknowledged son's concerns and reported that he is "still with it" and he wants to go home. CSW positively patient's rights to self determination.   Emotional Assessment Appearance:  Appears stated age Attitude/Demeanor/Rapport:  Engaged Affect (typically observed):  Calm, Appropriate, Pleasant Orientation:  Oriented to Self, Oriented to Situation, Oriented  to Place Alcohol / Substance use:  Not Applicable Psych involvement (Current and /or in the community):  No (Comment)  Discharge Needs  Concerns to be addressed:  Care Coordination Readmission within the last 30 days:  No Current discharge  risk:  Lives alone, Physical Impairment Barriers to Discharge:  Continued Medical Work up   [employer], LCSW 03/03/2018, 3:28 PM

## 2018-03-03 NOTE — Progress Notes (Signed)
Physical Therapy Treatment Patient Details Name: [name] MRN: [id number] DOB: [birth year] Today's Date: 03/03/2018    History of Present Illness 84 yo male with onset of SOB and cough, chills was admitted through ED and dx acute respiratory failure, atelectasis, vascular congestion and leukocytosis.  PMHx:  CAD, CABG, a-fib, CHF, PVD, GERD,     PT Comments    Patient plans for d/c home despite initial recommendations for SNF.  He will still be alone at home, but plans to have his niece, his neighbor and his son as support for as close monitoring as possible.  He uses his cell phone for safety alert system with son (two buttons) and he would call a neighbor.  Niece works from home and plans to have [facility] follow up closely as possible.  He seems aware of the risks and willing to take those risks.  We reviewed safety/fall prevention information.  HR 88, SpO2 95% after ambulation on RA.   Follow Up Recommendations  Home health PT(HH aide)     Equipment Recommendations  Rolling walker with 5" wheels    Recommendations for Other Services       Precautions / Restrictions Precautions Precautions: Fall    Mobility  Bed Mobility               General bed mobility comments: up in chair when PT arrived  Transfers   Equipment used: Rolling walker (2 wheeled) Transfers: Sit to/from Stand Sit to Stand: Supervision         General transfer comment: appropriate use of armrests and able to stand unaided, S for safety  Ambulation/Gait Ambulation/Gait assistance: Supervision Ambulation Distance (Feet): 60 Feet Assistive device: Rolling walker (2 wheeled) Gait Pattern/deviations: Step-through pattern;Decreased stride length;Shuffle     General Gait Details: using walker safely on turns, able to back to chair safely, slower pace and standing in place for awhile while discussing fall prevention   Stairs             Wheelchair Mobility    Modified Rankin (Stroke  Patients Only)       Balance Overall balance assessment: Needs assistance Sitting-balance support: Feet supported Sitting balance-Leahy Scale: Good     Standing balance support: Bilateral upper extremity supported Standing balance-Leahy Scale: Poor Standing balance comment: UE support in standing                            Cognition Arousal/Alertness: Awake/alert Behavior During Therapy: WFL for tasks assessed/performed Overall Cognitive Status: Within Functional Limits for tasks assessed                                        Exercises      General Comments General comments (skin integrity, edema, etc.): discussed/reviewed several fall prevention tips and his plan in case of falls at home as well as all the support he will have through home care and neighbors and family.  Patient aware of the risks of going home alone, but feels he would rather take the risks than return to SNF he has been at three times before.       Pertinent Vitals/Pain Pain Assessment: No/denies pain    Home Living                      Prior Function  PT Goals (current goals can now be found in the care plan section) Progress towards PT goals: Progressing toward goals    Frequency    Min 3X/week      PT Plan Discharge plan needs to be updated;Frequency needs to be updated    Co-evaluation              AM-PAC PT "6 Clicks" Daily Activity  Outcome Measure  Difficulty turning over in bed (including adjusting bedclothes, sheets and blankets)?: A Little Difficulty moving from lying on back to sitting on the side of the bed? : A Little Difficulty sitting down on and standing up from a chair with arms (e.g., wheelchair, bedside commode, etc,.)?: A Little Help needed moving to and from a bed to chair (including a wheelchair)?: A Little Help needed walking in hospital room?: A Little Help needed climbing 3-5 steps with a railing? : A Lot 6  Click Score: 17    End of Session Equipment Utilized During Treatment: Gait belt Activity Tolerance: Patient tolerated treatment well Patient left: with call bell/phone within reach;in chair   PT Visit Diagnosis: Unsteadiness on feet (R26.81);Muscle weakness (generalized) (M62.81);Adult, failure to thrive (R62.7);Difficulty in walking, not elsewhere classified (R26.2)     Time: 6286-3817 PT Time Calculation (min) (ACUTE ONLY): 22 min  Charges:  $Gait Training: 8-22 mins                    G CodesMagda Kiel, [place] [phone] 03/03/2018    Reginia Naas 03/03/2018, 6:24 PM

## 2018-03-03 NOTE — Care Management Important Message (Signed)
Important Message  Patient Details  Name: [name] MRN: [id number] Date of Birth: 1922-12-14   Medicare Important Message Given:  Yes    Kerin Salen 03/03/2018, 11:04 AMImportant Message  Patient Details  Name: [name] MRN: [id number] Date of Birth: [birth year]   Medicare Important Message Given:  Yes    Kerin Salen 03/03/2018, 11:04 AM

## 2018-03-03 NOTE — Consult Note (Signed)
[place] Nurse wound consult note Reason for Consult:Second request for Assessment for Pressure Injury.  Assessed with Bedside RN Gregary Signs earlier today and no pressure injuries noted.  [place] nursing team will not follow, but will remain available to this patient, the nursing and medical teams.  Please re-consult if needed. Thanks, Maudie Flakes, MSN, RN, [place], Arther Abbott  Pager# [phone]

## 2018-03-03 NOTE — Progress Notes (Signed)
ANTICOAGULATION CONSULT NOTE  Pharmacy Consult for warfarin Indication: atrial fibrillation  Patient Measurements: Height: 5\' 6"  (167.6 cm) Weight: 182 lb 5.1 oz (82.7 kg) IBW/kg (Calculated) : 63.8  Vital Signs: Temp: 97.8 F (36.6 C) (04/25 0447) Temp Source: Oral (04/25 0447) BP: 137/83 (04/25 0447) Pulse Rate: 65 (04/25 0447)  Labs: Recent Labs    03/01/18 0522 03/02/18 0809 03/03/18 0424  HGB 13.3 14.1  --   HCT 40.3 43.0  --   PLT 221 271  --   LABPROT 24.7* 28.8* 32.8*  INR 2.25 2.74 3.24  CREATININE 1.36* 1.22  --    Estimated Creatinine Clearance: 39 mL/min (by C-G formula based on SCr of 1.22 mg/dL).  Medical History: Past Medical History:  Diagnosis Date  . Arrhythmia    afib  . Arthritis    "back and hands" (09/26/2015)  . Basal cell carcinoma   . CHF (congestive heart failure) ([place])   . Coronary artery disease   . CVI (common variable immunodeficiency) ([place])   . DJD (degenerative joint disease)   . ED (erectile dysfunction)   . GERD (gastroesophageal reflux disease)   . Gout   . Heart attack ([place]) 1986  . Hyperlipidemia   . Hypertension   . IFG (impaired fasting glucose)   . Neuropathy   . Non Hodgkin's lymphoma ([place]) dx'd 2006  . PAC (premature atrial contraction)   . PE (pulmonary embolism) 2007   "when I was taking chemo"  . Peptic ulcer disease   . Pneumonia ~ 2007 X 2  . Squamous carcinoma   . Venous stasis ulcers (HCC)    Assessment: 89 YOM on warfarin prior to admission for h/o atrial fibrillation (remote h/o PE).  INR therapeutic at admission.  Patient reports taking warfarin 2.5mg  PO daily (Last dose 4/21).  Today, 03/03/2018  INR 3.24, patient eating well  CBC: Hgb and pltc WNL  Drug-Drug interactions: steroids, Allopurinol, Doxycycline may increase effects of warfarin  Goal of Therapy:  INR 2-3  Plan:   No Warfarin today  Follow INR trend closely  Daily PT/INR Monitor closely for s/sx of bleeding, last CBC 4/24  was wnl  Minda Ditto PharmD Pager [phone] 03/03/2018, 7:42 AM

## 2018-03-03 NOTE — Consult Note (Signed)
[place] Nurse wound consult note Reason for Consult: Assess for pressure injury Wound type:None Pressure Injury POA: NA Measurement:N/A Wound bed:N/A Drainage (amount, consistency, odor) None Periwound:N/A Dressing procedure/placement/frequency:Patient with  Left Great Toe overlapping 2nd digit with occasional and intermittent redness.  Nursing is placing a folded gauze 2x2 between the digits to pad and protect and it is effective.  No other alterations in skin integrity.   [place] nursing team will not follow, but will remain available to this patient, the nursing and medical teams.  Please re-consult if needed. Thanks, Maudie Flakes, MSN, RN, [place], Arther Abbott  Pager# [phone]

## 2018-03-03 NOTE — Progress Notes (Signed)
PROGRESS NOTE    [name]  BHA:[id number] DOB: Jul 15, 1923 DOA: 02/28/2018 PCP: Lavone Orn, MD   Brief Narrative: [name] is a 84 year old male with medical history significant for coronary artery disease, status post CABG, atrial fibrillation, CHF, PVD and GERD.  Patient presented to the emergency department complaining of  shortness of breath for 3 days prior to admission, associated symptoms included nonproductive cough and intermittent chills.  Upon ED evaluation chest x-ray shows increasing vascular congestion, cardiomegaly on possible right middle lobe infiltrate versus atelectasis.  Afebrile, mild leukocytosis and hypokalemia.  Patient was admitted with working diagnosis of acute respiratory failure.    Assessment & Plan:   Active Problems:   Acute on chronic diastolic CHF (congestive heart failure) (HCC)   Acute respiratory failure with hypoxia secondary to copd and CHF exacerbation>  Plan to wean him off oxygen as appropriate.    Acute COPD exacerbation: CT chest does not show pneumonia or masses. Old granulomatous disease RIGHT lower lobe with mild bibasilar atelectasis. Continue with duonebs , brovana and pulmicort.  Resume IV steroids another 24 hours, continue with duonebs and monitor.  Resume [place] oxygen to keep sats greater than 90%.  Pt still wheezing and not back to baseline.  Doxycycline added for bronchitis.    Resume lasix and monitor urine output. Not much urine output only about 700 ml overnight.  Strict intake and output. TED hoses for the lower extremities.     chronic atrial fibrillation:  Rate controlled.  Resume atenolol for rate control  and coumadin dosing as per pharmacy.    Hypokalemia: Replaced.   CAD S/P cabg No chest pain , resume aspirin.    Hypertension: well controlled.    Leukocytosis: probably from steroids.  Recheck cbc in am.       DVT prophylaxis: coumadin.  Code Status:full code.  Family  Communication: DISCUSSED with family at bedside.  Disposition Plan: possibly d/c in 1 to 2 days to SNF.    Consultants:   None.    Procedures: (none.    Antimicrobials: doxycycline since 4/24   Subjective: Reports coughing more, not back to baseline.  Breathing is still the same. No chest pain. Wants ted hose   Objective: Vitals:   03/03/18 0447 03/03/18 0449 03/03/18 0816 03/03/18 0944  BP: 137/83   (!) 145/65  Pulse: 65   66  Resp: 16   14  Temp: 97.8 F (36.6 C)     TempSrc: Oral     SpO2: 96%  96% 95%  Weight:  82.7 kg (182 lb 5.1 oz)    Height:        Intake/Output Summary (Last 24 hours) at 03/03/2018 1249 Last data filed at 03/03/2018 0824 Gross per 24 hour  Intake 360 ml  Output 950 ml  Net -590 ml   Filed Weights   03/01/18 0446 03/02/18 0433 03/03/18 0449  Weight: 82.7 kg (182 lb 5.1 oz) 82.7 kg (182 lb 5.1 oz) 82.7 kg (182 lb 5.1 oz)    Examination:  General exam: Appears uncomfortable on [place] oxygen.  Respiratory system: bilateral wheezing heard both anteriorly and posteriorly.  Cardiovascular system: S1 & S2 heard, RRR. No JVD  Gastrointestinal system: Abdomen is soft non tender non distended bowel sounds heard.  Central nervous system: Alert and oriented. No focal neurological deficits. Extremities: trace pedal edema.  Skin: No rashes, lesions or ulcers Psychiatry:  Mood & affect appropriate.     Data Reviewed: I have personally reviewed following  labs and imaging studies  CBC: Recent Labs  Lab 02/28/18 0727 03/01/18 0522 03/02/18 0809  WBC 12.8* 20.4* 24.4*  NEUTROABS  --   --  22.5*  HGB 13.3 13.3 14.1  HCT 40.0 40.3 43.0  MCV 104.4* 103.3* 103.1*  PLT 206 221 841   Basic Metabolic Panel: Recent Labs  Lab 02/28/18 0727 03/01/18 0522 03/02/18 0809  NA 142 139 139  K 3.3* 4.1 4.0  CL 101 102 100*  CO2 26 26 27   GLUCOSE 123* 158* 133*  BUN 22* 30* 41*  CREATININE 1.13 1.36* 1.22  CALCIUM 9.5 9.7 9.8  MG  --  2.4 2.3    GFR: Estimated Creatinine Clearance: 39 mL/min (by C-G formula based on SCr of 1.22 mg/dL). Liver Function Tests: Recent Labs  Lab 02/28/18 0727  AST 32  ALT 22  ALKPHOS 100  BILITOT 0.8  PROT 7.7  ALBUMIN 3.2*   No results for input(s): LIPASE, AMYLASE in the last 168 hours. No results for input(s): AMMONIA in the last 168 hours. Coagulation Profile: Recent Labs  Lab 02/28/18 0901 03/01/18 0522 03/02/18 0809 03/03/18 0424  INR 2.83 2.25 2.74 3.24   Cardiac Enzymes: Recent Labs  Lab 02/28/18 0727  TROPONINI <0.03   BNP (last 3 results) No results for input(s): PROBNP in the last 8760 hours. HbA1C: No results for input(s): HGBA1C in the last 72 hours. CBG: No results for input(s): GLUCAP in the last 168 hours. Lipid Profile: No results for input(s): CHOL, HDL, LDLCALC, TRIG, CHOLHDL, LDLDIRECT in the last 72 hours. Thyroid Function Tests: No results for input(s): TSH, T4TOTAL, FREET4, T3FREE, THYROIDAB in the last 72 hours. Anemia Panel: No results for input(s): VITAMINB12, FOLATE, FERRITIN, TIBC, IRON, RETICCTPCT in the last 72 hours. Sepsis Labs: Recent Labs  Lab 02/28/18 0727  PROCALCITON <0.10    Recent Results (from the past 240 hour(s))  Culture, Urine     Status: Abnormal   Collection Time: 03/01/18  6:57 PM  Result Value Ref Range Status   Specimen Description   Final    URINE, CLEAN CATCH Performed at [facility] [address] [place], [place] [address]    Special Requests   Final    NONE Performed at [facility], [place] [address], [place], [place] [address]    Culture (A)  Final    <10,000 COLONIES/mL INSIGNIFICANT GROWTH Performed at [facility] [address] [place],  [address]    Report Status 03/03/2018 FINAL  Final         Radiology Studies: No results found.      Scheduled Meds: . albuterol  5 mg Nebulization Once  . allopurinol  300 mg Oral Daily  .  arformoterol  15 mcg Nebulization BID  . aspirin EC  81 mg Oral QODAY  . atenolol  50 mg Oral Daily  . budesonide (PULMICORT) nebulizer solution  0.5 mg Nebulization BID  . cholecalciferol  4,000 Units Oral Daily  . dextromethorphan  30 mg Oral BID  . doxycycline  100 mg Oral Q12H  . furosemide  40 mg Oral BID  . gabapentin  100 mg Oral 3 times per day  . ipratropium-albuterol  3 mL Nebulization BID  . methylPREDNISolone (SOLU-MEDROL) injection  40 mg Intravenous Q12H  . senna-docusate  2 tablet Oral QHS  . Warfarin - Pharmacist Dosing Inpatient   Does not apply q1800   Continuous Infusions:   LOS: 3 days    Time  spent: 35 minutes    Hosie Poisson, MD [facility] Pager [phone]  If 7PM-7AM, please contact night-coverage [email or web address] Password [facility] 03/03/2018, 12:49 PM

## 2018-03-04 DIAGNOSIS — I872 Venous insufficiency (chronic) (peripheral): Secondary | ICD-10-CM

## 2018-03-04 DIAGNOSIS — I482 Chronic atrial fibrillation: Secondary | ICD-10-CM

## 2018-03-04 DIAGNOSIS — J9601 Acute respiratory failure with hypoxia: Secondary | ICD-10-CM

## 2018-03-04 LAB — CBC
HEMATOCRIT: 42.5 % (ref 39.0–52.0)
HEMOGLOBIN: 13.9 g/dL (ref 13.0–17.0)
MCH: 33.7 pg (ref 26.0–34.0)
MCHC: 32.7 g/dL (ref 30.0–36.0)
MCV: 102.9 fL — ABNORMAL HIGH (ref 78.0–100.0)
Platelets: 251 10*3/uL (ref 150–400)
RBC: 4.13 MIL/uL — ABNORMAL LOW (ref 4.22–5.81)
RDW: 14.5 % (ref 11.5–15.5)
WBC: 16.3 10*3/uL — ABNORMAL HIGH (ref 4.0–10.5)

## 2018-03-04 LAB — BASIC METABOLIC PANEL
Anion gap: 12 (ref 5–15)
BUN: 47 mg/dL — AB (ref 6–20)
CHLORIDE: 101 mmol/L (ref 101–111)
CO2: 28 mmol/L (ref 22–32)
CREATININE: 1.1 mg/dL (ref 0.61–1.24)
Calcium: 9.5 mg/dL (ref 8.9–10.3)
GFR calc Af Amer: >60 mL/min (ref >60)
GFR calc non Af Amer: 56 mL/min — ABNORMAL LOW (ref >60)
GLUCOSE: 152 mg/dL — AB (ref 65–99)
Potassium: 3.8 mmol/L (ref 3.5–5.1)
Sodium: 141 mmol/L (ref 135–145)

## 2018-03-04 LAB — PROTIME-INR
INR: 4.19
Prothrombin Time: 40.1 seconds — ABNORMAL HIGH (ref 11.4–15.2)

## 2018-03-04 MED ORDER — PREDNISONE 20 MG PO TABS: ORAL_TABLET | ORAL | 0 refills | Status: DC

## 2018-03-04 MED ORDER — BENZONATATE 200 MG PO CAPS: 200.0000 mg | ORAL_CAPSULE | Freq: Two times a day (BID) | ORAL | 0 refills | Status: DC | PRN

## 2018-03-04 MED ORDER — DOXYCYCLINE HYCLATE 100 MG PO TABS: 100.0000 mg | ORAL_TABLET | Freq: Two times a day (BID) | ORAL | 0 refills | Status: AC

## 2018-03-04 MED ORDER — IPRATROPIUM-ALBUTEROL 0.5-2.5 (3) MG/3ML IN SOLN: 3.0000 mL | Freq: Four times a day (QID) | RESPIRATORY_TRACT | 5 refills | Status: AC | PRN

## 2018-03-04 MED ORDER — HYDRALAZINE HCL 20 MG/ML IJ SOLN: 10.0000 mg | Freq: Four times a day (QID) | INTRAMUSCULAR | Status: DC | PRN

## 2018-03-04 MED ORDER — MOMETASONE FURO-FORMOTEROL FUM 200-5 MCG/ACT IN AERO: 2.0000 | INHALATION_SPRAY | Freq: Two times a day (BID) | RESPIRATORY_TRACT | 0 refills | Status: AC

## 2018-03-04 MED ORDER — WARFARIN SODIUM 2.5 MG PO TABS: 2.5000 mg | ORAL_TABLET | Freq: Every day | ORAL | Status: AC

## 2018-03-04 MED ORDER — ALBUTEROL SULFATE (2.5 MG/3ML) 0.083% IN NEBU: 2.5000 mg | INHALATION_SOLUTION | RESPIRATORY_TRACT | 12 refills | Status: AC | PRN

## 2018-03-04 MED ORDER — MOMETASONE FURO-FORMOTEROL FUM 200-5 MCG/ACT IN AERO
2.0000 | INHALATION_SPRAY | Freq: Two times a day (BID) | RESPIRATORY_TRACT | Status: DC
  Filled 2018-03-04: qty 8.8

## 2018-03-04 NOTE — Discharge Summary (Addendum)
Physician Discharge Summary  [name] [id number] DOB: 11-17-1922 DOA: 02/28/2018  PCP: Lavone Orn, MD  Admit date: 02/28/2018 Discharge date: 03/04/2018  Admitted From: Home.  Disposition:  Home  Recommendations for Outpatient Follow-up:  1. Follow up with PCP in 1-2 weeks 2. Please obtain BMP/CBC in one week 3. Please follow up with pulmonology in 2 weeks.   Home Health:yes  Discharge Condition: stable.  CODE STATUS: full code. Diet recommendation: Heart Healthy   Brief/Interim Summary: [name] a 84 year old male with medical history significant for coronary artery disease, status post CABG, atrial fibrillation, CHF, PVD and GERD. Patient presented to the emergency department complaining of shortness of breath for 3 days prior to admission, associated symptoms included nonproductive cough and intermittent chills. Upon ED evaluation chest x-ray shows increasing vascular congestion, cardiomegaly on possible right middle lobe infiltrate versus atelectasis. Afebrile, mild leukocytosis and hypokalemia. Patient was admitted with working diagnosis of acute respiratory failure.    Discharge Diagnoses:  Active Problems:   Atrial fibrillation (HCC)   Venous stasis dermatitis of both lower extremities   Acute respiratory failure (HCC)   Acute on chronic diastolic CHF (congestive heart failure) (HCC)   Acute respiratory failure with hypoxia secondary to copd and  Acute on chronic CHF exacerbation>   Acute COPD exacerbation: CT chest does not show pneumonia or masses. Old granulomatous disease RIGHT lower lobe with mild bibasilar atelectasis. Continue with duonebs , prednisone taper on discharge We were able to transition him off oxygen.  Doxycycline added for bronchitis.  Resume lasix for diastolic heart failure.  Strict intake and output. TED hoses for the lower extremities.     chronic atrial fibrillation:  Rate controlled.  Resume atenolol for  rate control  and coumadin dosing as per pharmacy.  As his iNR slightly high, hold coumadin tonight and restart coumadin in am.  Recheck INR in 2 to 5 days.    Hypokalemia: Replaced.   CAD S/P cabg No chest pain , resume aspirin.    Hypertension: well controlled.    Leukocytosis: probably from steroids.  Improving. Recommend check another cbc in one week.    venous stasis dermatitis of the lower extremities; One small blister on the left lower extremity.  Use ted hoses as recommended.     Discharge Instructions  Discharge Instructions    Diet - low sodium heart healthy   Complete by:  As directed    Discharge instructions   Complete by:  As directed    Please follow up with PCp in one week.     Allergies as of 03/04/2018      Reactions   Avelox [moxifloxacin Hcl In Nacl] Other (See Comments)   Messed up lungs   Indomethacin Other (See Comments)   Renal insufficiency   Levaquin [levofloxacin] Other (See Comments)   Messed up lungs   Lipitor [atorvastatin] Nausea Only, Palpitations, Other (See Comments)   Irregular heart beat also   Moxifloxacin Other (See Comments)   Respiratory distress   Pravachol [pravastatin Sodium] Nausea Only, Palpitations, Other (See Comments)   Aleve [naproxen Sodium] Nausea And Vomiting, Other (See Comments)   GI Upset   Norpace [disopyramide Phosphate] Other (See Comments)   Urinary retention   Disopyramide Other (See Comments)   Urinary retention   Morphine Nausea Only   Irregular hear beat   Pravastatin Nausea Only   Irregular heart beat   Amlodipine Besylate Rash   Azithromycin Rash   Bactrim [sulfamethoxazole-trimethoprim] Rash   Clinoril [  sulindac] Rash   Codeine Other (See Comments)   unknown   Digoxin Rash   Lorazepam Other (See Comments)   unknown   Morphine And Related Other (See Comments)   unknown   Other Other (See Comments)   Tripilenomenon? Per paperwork   Oxycodone-acetaminophen Rash   Percocet  [oxycodone-acetaminophen] Rash   Sulfamethoxazole-trimethoprim Rash   Sulfonamide Derivatives Rash   Uloric [febuxostat] Other (See Comments)   unknown      Medication List    STOP taking these medications   loratadine 10 MG tablet Commonly known as:  CLARITIN     TAKE these medications   acetaminophen 500 MG tablet Commonly known as:  TYLENOL Take 500 mg by mouth at bedtime.   albuterol (2.5 MG/3ML) 0.083% nebulizer solution Commonly known as:  PROVENTIL Take 3 mLs (2.5 mg total) by nebulization every 4 (four) hours as needed for wheezing or shortness of breath. What changed:  Another medication with the same name was added. Make sure you understand how and when to take each.   albuterol (2.5 MG/3ML) 0.083% nebulizer solution Commonly known as:  PROVENTIL Take 3 mLs (2.5 mg total) by nebulization every 2 (two) hours as needed for wheezing or shortness of breath. What changed:  You were already taking a medication with the same name, and this prescription was added. Make sure you understand how and when to take each.   allopurinol 300 MG tablet Commonly known as:  ZYLOPRIM Take 300 mg by mouth daily.   ALPRAZolam 0.5 MG tablet Commonly known as:  XANAX Take 1 tablet (0.5 mg total) by mouth at bedtime as needed for anxiety. For sleep What changed:    when to take this  additional instructions   aspirin 81 MG tablet Take 81 mg by mouth every other day.   atenolol 50 MG tablet Commonly known as:  TENORMIN Take 50 mg by mouth daily.   benzonatate 200 MG capsule Commonly known as:  TESSALON Take 1 capsule (200 mg total) by mouth 2 (two) times daily as needed for cough.   Cholecalciferol 2000 units Caps Take 4,000 Units by mouth daily.   clotrimazole-betamethasone cream Commonly known as:  LOTRISONE Apply 1 application topically as directed.   colchicine 0.6 MG tablet Take 0.6 mg by mouth daily as needed (gout).   doxycycline 100 MG tablet Commonly known as:   VIBRA-TABS Take 1 tablet (100 mg total) by mouth every 12 (twelve) hours for 4 days.   furosemide 40 MG tablet Commonly known as:  LASIX 80 mg every AM and 40 mg every PM   gabapentin 100 MG capsule Commonly known as:  NEURONTIN Take 100 mg by mouth 3 (three) times daily. Takes at 0900,1700,1900 daily   ipratropium-albuterol 0.5-2.5 (3) MG/3ML Soln Commonly known as:  DUONEB Take 3 mLs by nebulization every 6 (six) hours as needed.   mometasone-formoterol 200-5 MCG/ACT Aero Commonly known as:  DULERA Inhale 2 puffs into the lungs 2 (two) times daily.   nitroGLYCERIN 0.4 MG SL tablet Commonly known as:  NITROSTAT Place 0.4 mg under the tongue every 5 (five) minutes as needed for chest pain.   polyethylene glycol packet Commonly known as:  MIRALAX / GLYCOLAX Take 17 g by mouth daily as needed for mild constipation.   potassium chloride SA 20 MEQ tablet Commonly known as:  K-DUR,KLOR-CON Take 40 mEq by mouth 2 (two) times daily.   predniSONE 20 MG tablet Commonly known as:  DELTASONE Prednisone 60 mg daily for 3  days followed by  Prednisone 40 mg daily for 3 days followed by  Prednisone 20 mg daily for 3 days and stop.   sennosides-docusate sodium 8.6-50 MG tablet Commonly known as:  SENOKOT-S Take 2 tablets by mouth at bedtime.   triamcinolone cream 0.1 % Commonly known as:  KENALOG Apply 1 application topically daily as needed for rash.   warfarin 2.5 MG tablet Commonly known as:  COUMADIN Take 1 tablet (2.5 mg total) by mouth daily at 6 PM. Or as directed       Allergies  Allergen Reactions  . Avelox [Moxifloxacin Hcl In Nacl] Other (See Comments)    Messed up lungs  . Indomethacin Other (See Comments)    Renal insufficiency  . Levaquin [Levofloxacin] Other (See Comments)    Messed up lungs  . Lipitor [Atorvastatin] Nausea Only, Palpitations and Other (See Comments)    Irregular heart beat also  . Moxifloxacin Other (See Comments)    Respiratory distress   . Pravachol [Pravastatin Sodium] Nausea Only, Palpitations and Other (See Comments)  . Aleve [Naproxen Sodium] Nausea And Vomiting and Other (See Comments)    GI Upset  . Norpace [Disopyramide Phosphate] Other (See Comments)    Urinary retention  . Disopyramide Other (See Comments)    Urinary retention  . Morphine Nausea Only    Irregular hear beat  . Pravastatin Nausea Only    Irregular heart beat  . Amlodipine Besylate Rash  . Azithromycin Rash  . Bactrim [Sulfamethoxazole-Trimethoprim] Rash  . Clinoril [Sulindac] Rash  . Codeine Other (See Comments)    unknown  . Digoxin Rash  . Lorazepam Other (See Comments)    unknown  . Morphine And Related Other (See Comments)    unknown  . Other Other (See Comments)    Tripilenomenon? Per paperwork  . Oxycodone-Acetaminophen Rash  . Percocet [Oxycodone-Acetaminophen] Rash  . Sulfamethoxazole-Trimethoprim Rash  . Sulfonamide Derivatives Rash  . Uloric [Febuxostat] Other (See Comments)    unknown    Consultations:  None.    Procedures/Studies: Dg Chest 2 View  Result Date: 02/28/2018 CLINICAL DATA:  84 year old male with increased shortness of breath. History of congestive heart failure. Initial encounter. EXAM: CHEST - 2 VIEW COMPARISON:  08/17/2017 chest x-ray. FINDINGS: Cardiomegaly with biventricular pacer in place. Pulmonary vascular congestion with cephalization of vasculature suggesting mild pulmonary edema. No segmental infiltrate or pneumothorax. Calcified tortuous aorta. Chronic T10 anterior wedge compression fracture. Schmorl's node deformity superior endplate L2 and L3. IMPRESSION: Pulmonary vascular congestion with cephalization of vasculature suggesting mild pulmonary edema. Cardiomegaly with biventricular pacer in place. Aortic Atherosclerosis (ICD10-I70.0). Chronic T10 anterior wedge compression fracture. Electronically Signed   By: Genia Del M.D.   On: 02/28/2018 08:18   Ct Chest Wo Contrast  Result Date:  03/01/2018 CLINICAL DATA:  Gradually worsening shortness of breath for 3 days, history coronary artery disease post MI and CABG, chronic diastolic heart failure, atrial fibrillation, EXAM: CT CHEST WITHOUT CONTRAST TECHNIQUE: Multidetector CT imaging of the chest was performed following the standard protocol without IV contrast. Sagittal and coronal MPR images reconstructed from axial data set. COMPARISON:  PET-CT 01/16/2011 FINDINGS: Cardiovascular: Pacemaker leads RIGHT atrium and RIGHT ventricle. Atherosclerotic calcifications aorta, proximal great vessels and coronary arteries. Aorta normal caliber. Enlargement of cardiac chambers. No pericardial effusion. Mediastinum/Nodes: Esophagus unremarkable. Base of cervical region normal appearance. Scattered normal size mediastinal lymph nodes. No thoracic adenopathy. Lungs/Pleura: Calcified granulomata LEFT lower lobe. Minimal bibasilar atelectasis. Central peribronchial thickening. No pulmonary infiltrate, pleural effusion or  pneumothorax. No additional pulmonary mass. Upper Abdomen: Cyst at upper pole LEFT kidney 3.2 x 2.9 cm image 141. Probable additional small cyst at medial LEFT kidney on last image, incompletely visualized but present on prior PET-CT. Remaining visualized upper abdomen unremarkable. Musculoskeletal: Osseous demineralization with chronic compression deformity of a lower thoracic vertebra approximately T8. Prior median sternotomy. Scattered degenerative disc disease changes thoracic spine. Superior endplate concavity lower thoracic spine at approximately T10. IMPRESSION: Extensive atherosclerotic disease calcifications. Old granulomatous disease RIGHT lower lobe with mild bibasilar atelectasis. No acute intrathoracic abnormalities. LEFT renal cysts. Aortic Atherosclerosis (ICD10-I70.0). Electronically Signed   By: Lavonia Dana M.D.   On: 03/01/2018 12:29      Subjective: Patient does not want to go to rehab, and wants to go home with home  health PT.   Discharge Exam: Vitals:   03/04/18 0442 03/04/18 0827  BP: (!) 170/74   Pulse: 64   Resp: 18   Temp: 97.8 F (36.6 C)   SpO2: 99% 97%   Vitals:   03/03/18 2141 03/03/18 2200 03/04/18 0442 03/04/18 0827  BP:  (!) 160/81 (!) 170/74   Pulse:  67 64   Resp:  20 18   Temp:  98.2 F (36.8 C) 97.8 F (36.6 C)   TempSrc:  Oral Oral   SpO2: 94% 97% 99% 97%  Weight:   85 kg (187 lb 6.3 oz)   Height:        General: Pt is alert, awake, not in acute distress Cardiovascular: RRR, S1/S2 +, no rubs, no gallops Respiratory: CTA bilaterally, no wheezing, no rhonchi Abdominal: Soft, NT, ND, bowel sounds + Extremities: no edema, no cyanosis    The results of significant diagnostics from this hospitalization (including imaging, microbiology, ancillary and laboratory) are listed below for reference.     Microbiology: Recent Results (from the past 240 hour(s))  Culture, Urine     Status: Abnormal   Collection Time: 03/01/18  6:57 PM  Result Value Ref Range Status   Specimen Description   Final    URINE, CLEAN CATCH Performed at [facility], [place] [address], [place], [place] [address]    Special Requests   Final    NONE Performed at [facility], [place] [address], [place], [place] [address]    Culture (A)  Final    <10,000 COLONIES/mL INSIGNIFICANT GROWTH Performed at [facility] [address], [place], [place] [address]    Report Status 03/03/2018 FINAL  Final     Labs: BNP (last 3 results) Recent Labs    03/21/17 1631 08/15/17 0917 02/28/18 0727  BNP 127.7* 272.4* 423.5*   Basic Metabolic Panel: Recent Labs  Lab 02/28/18 0727 03/01/18 0522 03/02/18 0809 03/04/18 0424  NA 142 139 139 141  K 3.3* 4.1 4.0 3.8  CL 101 102 100* 101  CO2 26 26 27 28   GLUCOSE 123* 158* 133* 152*  BUN 22* 30* 41* 47*  CREATININE 1.13 1.36* 1.22 1.10  CALCIUM 9.5 9.7 9.8 9.5  MG  --  2.4 2.3  --    Liver Function  Tests: Recent Labs  Lab 02/28/18 0727  AST 32  ALT 22  ALKPHOS 100  BILITOT 0.8  PROT 7.7  ALBUMIN 3.2*   No results for input(s): LIPASE, AMYLASE in the last 168 hours. No results for input(s): AMMONIA in the last 168 hours. CBC: Recent Labs  Lab 02/28/18 0727 03/01/18 0522 03/02/18 0809 03/04/18 0424  WBC 12.8* 20.4*  24.4* 16.3*  NEUTROABS  --   --  22.5*  --   HGB 13.3 13.3 14.1 13.9  HCT 40.0 40.3 43.0 42.5  MCV 104.4* 103.3* 103.1* 102.9*  PLT 206 221 271 251   Cardiac Enzymes: Recent Labs  Lab 02/28/18 0727  TROPONINI <0.03   BNP: Invalid input(s): POCBNP CBG: No results for input(s): GLUCAP in the last 168 hours. D-Dimer No results for input(s): DDIMER in the last 72 hours. Hgb A1c No results for input(s): HGBA1C in the last 72 hours. Lipid Profile No results for input(s): CHOL, HDL, LDLCALC, TRIG, CHOLHDL, LDLDIRECT in the last 72 hours. Thyroid function studies No results for input(s): TSH, T4TOTAL, T3FREE, THYROIDAB in the last 72 hours.  Invalid input(s): FREET3 Anemia work up No results for input(s): VITAMINB12, FOLATE, FERRITIN, TIBC, IRON, RETICCTPCT in the last 72 hours. Urinalysis    Component Value Date/Time   COLORURINE YELLOW 03/01/2018 1857   APPEARANCEUR CLEAR 03/01/2018 1857   LABSPEC 1.014 03/01/2018 1857   PHURINE 6.0 03/01/2018 1857   GLUCOSEU NEGATIVE 03/01/2018 1857   HGBUR NEGATIVE 03/01/2018 1857   BILIRUBINUR NEGATIVE 03/01/2018 1857   KETONESUR NEGATIVE 03/01/2018 1857   PROTEINUR NEGATIVE 03/01/2018 1857   UROBILINOGEN 0.2 07/08/2015 0428   NITRITE NEGATIVE 03/01/2018 1857   LEUKOCYTESUR NEGATIVE 03/01/2018 1857   Sepsis Labs Invalid input(s): PROCALCITONIN,  WBC,  LACTICIDVEN Microbiology Recent Results (from the past 240 hour(s))  Culture, Urine     Status: Abnormal   Collection Time: 03/01/18  6:57 PM  Result Value Ref Range Status   Specimen Description   Final    URINE, CLEAN CATCH Performed at [facility], [place] [address] [place], [place] [address]    Special Requests   Final    NONE Performed at [facility], [place] [address], [place], [place] [address]    Culture (A)  Final    <10,000 COLONIES/mL INSIGNIFICANT GROWTH Performed at [facility] [address], [place], [place] [address]    Report Status 03/03/2018 FINAL  Final     Time coordinating discharge: 34 minutes  SIGNED:   Hosie Poisson, MD  [facility] 03/06/2018, 10:30 PM Pager   If 7PM-7AM, please contact night-coverage [email or web address] Password [email or web address]

## 2018-03-04 NOTE — Progress Notes (Signed)
Pt discharged from the unit via wheelchair. Discharge instructions reviewed with pt and family member. No questions or concerns at this time.

## 2018-03-04 NOTE — Progress Notes (Signed)
ANTICOAGULATION CONSULT NOTE  Pharmacy Consult for warfarin Indication: atrial fibrillation  Patient Measurements: Height: 5\' 6"  (167.6 cm) Weight: 187 lb 6.3 oz (85 kg) IBW/kg (Calculated) : 63.8  Vital Signs: Temp: 97.8 F (36.6 C) (04/26 0442) Temp Source: Oral (04/26 0442) BP: 170/74 (04/26 0442) Pulse Rate: 64 (04/26 0442)  Labs: Recent Labs    03/02/18 0809 03/03/18 0424 03/04/18 0424  HGB 14.1  --  13.9  HCT 43.0  --  42.5  PLT 271  --  251  LABPROT 28.8* 32.8* 40.1*  INR 2.74 3.24 4.19*  CREATININE 1.22  --  1.10   Estimated Creatinine Clearance: 43.8 mL/min (by C-G formula based on SCr of 1.1 mg/dL).  Medical History: Past Medical History:  Diagnosis Date  . Arrhythmia    afib  . Arthritis    "back and hands" (09/26/2015)  . Basal cell carcinoma   . CHF (congestive heart failure) ([place])   . Coronary artery disease   . CVI (common variable immunodeficiency) ([place])   . DJD (degenerative joint disease)   . ED (erectile dysfunction)   . GERD (gastroesophageal reflux disease)   . Gout   . Heart attack ([place]) 1986  . Hyperlipidemia   . Hypertension   . IFG (impaired fasting glucose)   . Neuropathy   . Non Hodgkin's lymphoma ([place]) dx'd 2006  . PAC (premature atrial contraction)   . PE (pulmonary embolism) 2007   "when I was taking chemo"  . Peptic ulcer disease   . Pneumonia ~ 2007 X 2  . Squamous carcinoma   . Venous stasis ulcers (HCC)    Assessment: 89 YOM on warfarin prior to admission for h/o atrial fibrillation (remote h/o PE).  INR therapeutic at admission.  Patient reports taking warfarin 2.5mg  PO daily (Last dose 4/21).  Today, 03/04/2018  INR 4.19, patient eating well, see interacting meds  CBC: Hgb and pltc WNL  Drug-Drug interactions: steroids, Allopurinol, Doxycycline may increase effects of warfarin  Goal of Therapy:  INR 2-3  Plan:   No Warfarin today  Follow INR trend closely  Daily PT/INR Monitor closely for s/sx of  bleeding, last CBC 4/26 was wnl  Minda Ditto PharmD Pager [phone] 03/04/2018, 7:57 AM

## 2018-03-10 DIAGNOSIS — L304 Erythema intertrigo: Secondary | ICD-10-CM | POA: Diagnosis not present

## 2018-03-10 DIAGNOSIS — D0439 Carcinoma in situ of skin of other parts of face: Secondary | ICD-10-CM | POA: Diagnosis not present

## 2018-03-10 DIAGNOSIS — Z85828 Personal history of other malignant neoplasm of skin: Secondary | ICD-10-CM | POA: Diagnosis not present

## 2018-03-14 DIAGNOSIS — R531 Weakness: Secondary | ICD-10-CM | POA: Diagnosis not present

## 2018-03-14 DIAGNOSIS — B0052 Herpesviral keratitis: Secondary | ICD-10-CM | POA: Diagnosis not present

## 2018-03-14 DIAGNOSIS — R1310 Dysphagia, unspecified: Secondary | ICD-10-CM | POA: Diagnosis not present

## 2018-03-14 DIAGNOSIS — H0100A Unspecified blepharitis right eye, upper and lower eyelids: Secondary | ICD-10-CM | POA: Diagnosis not present

## 2018-03-14 DIAGNOSIS — J209 Acute bronchitis, unspecified: Secondary | ICD-10-CM | POA: Diagnosis not present

## 2018-03-14 DIAGNOSIS — H10503 Unspecified blepharoconjunctivitis, bilateral: Secondary | ICD-10-CM | POA: Diagnosis not present

## 2018-03-15 DIAGNOSIS — H01003 Unspecified blepharitis right eye, unspecified eyelid: Secondary | ICD-10-CM | POA: Diagnosis not present

## 2018-03-15 DIAGNOSIS — H01006 Unspecified blepharitis left eye, unspecified eyelid: Secondary | ICD-10-CM | POA: Diagnosis not present

## 2018-03-15 DIAGNOSIS — E876 Hypokalemia: Secondary | ICD-10-CM | POA: Diagnosis not present

## 2018-03-15 DIAGNOSIS — D72829 Elevated white blood cell count, unspecified: Secondary | ICD-10-CM | POA: Diagnosis not present

## 2018-03-15 DIAGNOSIS — I2510 Atherosclerotic heart disease of native coronary artery without angina pectoris: Secondary | ICD-10-CM | POA: Diagnosis not present

## 2018-03-15 DIAGNOSIS — R2689 Other abnormalities of gait and mobility: Secondary | ICD-10-CM | POA: Diagnosis not present

## 2018-03-15 DIAGNOSIS — R791 Abnormal coagulation profile: Secondary | ICD-10-CM | POA: Diagnosis not present

## 2018-03-15 DIAGNOSIS — H01005 Unspecified blepharitis left lower eyelid: Secondary | ICD-10-CM | POA: Diagnosis not present

## 2018-03-15 DIAGNOSIS — J302 Other seasonal allergic rhinitis: Secondary | ICD-10-CM | POA: Diagnosis not present

## 2018-03-15 DIAGNOSIS — R05 Cough: Secondary | ICD-10-CM | POA: Diagnosis not present

## 2018-03-15 DIAGNOSIS — J181 Lobar pneumonia, unspecified organism: Secondary | ICD-10-CM | POA: Diagnosis not present

## 2018-03-15 DIAGNOSIS — H44003 Unspecified purulent endophthalmitis, bilateral: Secondary | ICD-10-CM | POA: Diagnosis not present

## 2018-03-15 DIAGNOSIS — R062 Wheezing: Secondary | ICD-10-CM | POA: Diagnosis not present

## 2018-03-15 DIAGNOSIS — I4891 Unspecified atrial fibrillation: Secondary | ICD-10-CM | POA: Diagnosis not present

## 2018-03-15 DIAGNOSIS — B0052 Herpesviral keratitis: Secondary | ICD-10-CM | POA: Diagnosis not present

## 2018-03-15 DIAGNOSIS — H44009 Unspecified purulent endophthalmitis, unspecified eye: Secondary | ICD-10-CM | POA: Diagnosis not present

## 2018-03-15 DIAGNOSIS — M6281 Muscle weakness (generalized): Secondary | ICD-10-CM | POA: Diagnosis not present

## 2018-03-15 DIAGNOSIS — J189 Pneumonia, unspecified organism: Secondary | ICD-10-CM | POA: Diagnosis not present

## 2018-03-15 DIAGNOSIS — I872 Venous insufficiency (chronic) (peripheral): Secondary | ICD-10-CM | POA: Diagnosis not present

## 2018-03-15 DIAGNOSIS — I509 Heart failure, unspecified: Secondary | ICD-10-CM | POA: Diagnosis not present

## 2018-03-15 DIAGNOSIS — I10 Essential (primary) hypertension: Secondary | ICD-10-CM | POA: Diagnosis not present

## 2018-03-15 DIAGNOSIS — H01002 Unspecified blepharitis right lower eyelid: Secondary | ICD-10-CM | POA: Diagnosis not present

## 2018-03-15 DIAGNOSIS — C8190 Hodgkin lymphoma, unspecified, unspecified site: Secondary | ICD-10-CM | POA: Diagnosis not present

## 2018-03-15 DIAGNOSIS — H16213 Exposure keratoconjunctivitis, bilateral: Secondary | ICD-10-CM | POA: Diagnosis not present

## 2018-03-15 DIAGNOSIS — S0502XA Injury of conjunctiva and corneal abrasion without foreign body, left eye, initial encounter: Secondary | ICD-10-CM | POA: Diagnosis not present

## 2018-03-16 DIAGNOSIS — I4891 Unspecified atrial fibrillation: Secondary | ICD-10-CM | POA: Diagnosis not present

## 2018-03-16 DIAGNOSIS — H44003 Unspecified purulent endophthalmitis, bilateral: Secondary | ICD-10-CM | POA: Diagnosis not present

## 2018-03-16 DIAGNOSIS — I509 Heart failure, unspecified: Secondary | ICD-10-CM | POA: Diagnosis not present

## 2018-03-16 DIAGNOSIS — B0052 Herpesviral keratitis: Secondary | ICD-10-CM | POA: Diagnosis not present

## 2018-03-16 DIAGNOSIS — I10 Essential (primary) hypertension: Secondary | ICD-10-CM | POA: Diagnosis not present

## 2018-03-17 DIAGNOSIS — H44009 Unspecified purulent endophthalmitis, unspecified eye: Secondary | ICD-10-CM | POA: Diagnosis not present

## 2018-03-17 DIAGNOSIS — I509 Heart failure, unspecified: Secondary | ICD-10-CM | POA: Diagnosis not present

## 2018-03-17 DIAGNOSIS — D72829 Elevated white blood cell count, unspecified: Secondary | ICD-10-CM | POA: Diagnosis not present

## 2018-03-17 DIAGNOSIS — R791 Abnormal coagulation profile: Secondary | ICD-10-CM | POA: Diagnosis not present

## 2018-03-18 DIAGNOSIS — B0052 Herpesviral keratitis: Secondary | ICD-10-CM | POA: Diagnosis not present

## 2018-03-18 DIAGNOSIS — H01003 Unspecified blepharitis right eye, unspecified eyelid: Secondary | ICD-10-CM | POA: Diagnosis not present

## 2018-03-18 DIAGNOSIS — J181 Lobar pneumonia, unspecified organism: Secondary | ICD-10-CM | POA: Diagnosis not present

## 2018-03-18 DIAGNOSIS — H01006 Unspecified blepharitis left eye, unspecified eyelid: Secondary | ICD-10-CM | POA: Diagnosis not present

## 2018-03-21 DIAGNOSIS — B0052 Herpesviral keratitis: Secondary | ICD-10-CM | POA: Diagnosis not present

## 2018-03-25 DIAGNOSIS — B0052 Herpesviral keratitis: Secondary | ICD-10-CM | POA: Diagnosis not present

## 2018-03-25 DIAGNOSIS — S0502XA Injury of conjunctiva and corneal abrasion without foreign body, left eye, initial encounter: Secondary | ICD-10-CM | POA: Diagnosis not present

## 2018-03-28 DIAGNOSIS — R05 Cough: Secondary | ICD-10-CM | POA: Diagnosis not present

## 2018-03-28 DIAGNOSIS — J302 Other seasonal allergic rhinitis: Secondary | ICD-10-CM | POA: Diagnosis not present

## 2018-03-28 DIAGNOSIS — R062 Wheezing: Secondary | ICD-10-CM | POA: Diagnosis not present

## 2018-03-28 DIAGNOSIS — B0052 Herpesviral keratitis: Secondary | ICD-10-CM | POA: Diagnosis not present

## 2018-03-29 DIAGNOSIS — H01005 Unspecified blepharitis left lower eyelid: Secondary | ICD-10-CM | POA: Diagnosis not present

## 2018-03-29 DIAGNOSIS — H01002 Unspecified blepharitis right lower eyelid: Secondary | ICD-10-CM | POA: Diagnosis not present

## 2018-03-29 DIAGNOSIS — E876 Hypokalemia: Secondary | ICD-10-CM | POA: Diagnosis not present

## 2018-03-29 DIAGNOSIS — J181 Lobar pneumonia, unspecified organism: Secondary | ICD-10-CM | POA: Diagnosis not present

## 2018-03-29 DIAGNOSIS — H16213 Exposure keratoconjunctivitis, bilateral: Secondary | ICD-10-CM | POA: Diagnosis not present

## 2018-04-01 DIAGNOSIS — J181 Lobar pneumonia, unspecified organism: Secondary | ICD-10-CM | POA: Diagnosis not present

## 2018-04-04 ENCOUNTER — Inpatient Hospital Stay (HOSPITAL_COMMUNITY)
Admission: EM | Admit: 2018-04-04 | Discharge: 2018-04-07 | DRG: 292 | Disposition: A | Discharge status: Home / Self Care | Payer: Medicare Other | Attending: Family Medicine | Admitting: Family Medicine

## 2018-04-04 ENCOUNTER — Emergency Department (HOSPITAL_COMMUNITY): Payer: Medicare Other

## 2018-04-04 ENCOUNTER — Encounter (HOSPITAL_COMMUNITY): Payer: Self-pay

## 2018-04-04 ENCOUNTER — Other Ambulatory Visit: Payer: Self-pay

## 2018-04-04 DIAGNOSIS — M19042 Primary osteoarthritis, left hand: Secondary | ICD-10-CM | POA: Diagnosis present

## 2018-04-04 DIAGNOSIS — Z8701 Personal history of pneumonia (recurrent): Secondary | ICD-10-CM | POA: Diagnosis not present

## 2018-04-04 DIAGNOSIS — M1990 Unspecified osteoarthritis, unspecified site: Secondary | ICD-10-CM | POA: Diagnosis present

## 2018-04-04 DIAGNOSIS — Z950 Presence of cardiac pacemaker: Secondary | ICD-10-CM | POA: Diagnosis present

## 2018-04-04 DIAGNOSIS — Z961 Presence of intraocular lens: Secondary | ICD-10-CM | POA: Diagnosis present

## 2018-04-04 DIAGNOSIS — Z9089 Acquired absence of other organs: Secondary | ICD-10-CM

## 2018-04-04 DIAGNOSIS — Z9841 Cataract extraction status, right eye: Secondary | ICD-10-CM | POA: Diagnosis not present

## 2018-04-04 DIAGNOSIS — Z882 Allergy status to sulfonamides status: Secondary | ICD-10-CM

## 2018-04-04 DIAGNOSIS — Z825 Family history of asthma and other chronic lower respiratory diseases: Secondary | ICD-10-CM

## 2018-04-04 DIAGNOSIS — J189 Pneumonia, unspecified organism: Secondary | ICD-10-CM | POA: Diagnosis not present

## 2018-04-04 DIAGNOSIS — I110 Hypertensive heart disease with heart failure: Principal | ICD-10-CM | POA: Diagnosis present

## 2018-04-04 DIAGNOSIS — D839 Common variable immunodeficiency, unspecified: Secondary | ICD-10-CM | POA: Diagnosis present

## 2018-04-04 DIAGNOSIS — R0602 Shortness of breath: Secondary | ICD-10-CM | POA: Diagnosis present

## 2018-04-04 DIAGNOSIS — L97909 Non-pressure chronic ulcer of unspecified part of unspecified lower leg with unspecified severity: Secondary | ICD-10-CM | POA: Diagnosis not present

## 2018-04-04 DIAGNOSIS — E785 Hyperlipidemia, unspecified: Secondary | ICD-10-CM | POA: Diagnosis not present

## 2018-04-04 DIAGNOSIS — J440 Chronic obstructive pulmonary disease with acute lower respiratory infection: Secondary | ICD-10-CM | POA: Diagnosis present

## 2018-04-04 DIAGNOSIS — Z8349 Family history of other endocrine, nutritional and metabolic diseases: Secondary | ICD-10-CM

## 2018-04-04 DIAGNOSIS — Z7901 Long term (current) use of anticoagulants: Secondary | ICD-10-CM

## 2018-04-04 DIAGNOSIS — I5033 Acute on chronic diastolic (congestive) heart failure: Secondary | ICD-10-CM | POA: Diagnosis present

## 2018-04-04 DIAGNOSIS — L8990 Pressure ulcer of unspecified site, unspecified stage: Secondary | ICD-10-CM | POA: Diagnosis present

## 2018-04-04 DIAGNOSIS — I2510 Atherosclerotic heart disease of native coronary artery without angina pectoris: Secondary | ICD-10-CM | POA: Diagnosis present

## 2018-04-04 DIAGNOSIS — K219 Gastro-esophageal reflux disease without esophagitis: Secondary | ICD-10-CM | POA: Diagnosis not present

## 2018-04-04 DIAGNOSIS — I482 Chronic atrial fibrillation, unspecified: Secondary | ICD-10-CM | POA: Diagnosis present

## 2018-04-04 DIAGNOSIS — Z9842 Cataract extraction status, left eye: Secondary | ICD-10-CM

## 2018-04-04 DIAGNOSIS — Z881 Allergy status to other antibiotic agents status: Secondary | ICD-10-CM

## 2018-04-04 DIAGNOSIS — I255 Ischemic cardiomyopathy: Secondary | ICD-10-CM | POA: Diagnosis not present

## 2018-04-04 DIAGNOSIS — Z8249 Family history of ischemic heart disease and other diseases of the circulatory system: Secondary | ICD-10-CM | POA: Diagnosis not present

## 2018-04-04 DIAGNOSIS — Z951 Presence of aortocoronary bypass graft: Secondary | ICD-10-CM | POA: Diagnosis not present

## 2018-04-04 DIAGNOSIS — M109 Gout, unspecified: Secondary | ICD-10-CM | POA: Diagnosis not present

## 2018-04-04 DIAGNOSIS — I872 Venous insufficiency (chronic) (peripheral): Secondary | ICD-10-CM | POA: Diagnosis not present

## 2018-04-04 DIAGNOSIS — Z888 Allergy status to other drugs, medicaments and biological substances status: Secondary | ICD-10-CM

## 2018-04-04 DIAGNOSIS — Z833 Family history of diabetes mellitus: Secondary | ICD-10-CM | POA: Diagnosis not present

## 2018-04-04 DIAGNOSIS — Z87891 Personal history of nicotine dependence: Secondary | ICD-10-CM

## 2018-04-04 DIAGNOSIS — Z885 Allergy status to narcotic agent status: Secondary | ICD-10-CM

## 2018-04-04 DIAGNOSIS — Z79899 Other long term (current) drug therapy: Secondary | ICD-10-CM | POA: Diagnosis not present

## 2018-04-04 DIAGNOSIS — Z9861 Coronary angioplasty status: Secondary | ICD-10-CM

## 2018-04-04 DIAGNOSIS — I480 Paroxysmal atrial fibrillation: Secondary | ICD-10-CM | POA: Diagnosis present

## 2018-04-04 DIAGNOSIS — I4891 Unspecified atrial fibrillation: Secondary | ICD-10-CM | POA: Diagnosis present

## 2018-04-04 DIAGNOSIS — Z7982 Long term (current) use of aspirin: Secondary | ICD-10-CM

## 2018-04-04 DIAGNOSIS — Z8572 Personal history of non-Hodgkin lymphomas: Secondary | ICD-10-CM

## 2018-04-04 DIAGNOSIS — Z86711 Personal history of pulmonary embolism: Secondary | ICD-10-CM

## 2018-04-04 DIAGNOSIS — M19041 Primary osteoarthritis, right hand: Secondary | ICD-10-CM | POA: Diagnosis present

## 2018-04-04 DIAGNOSIS — Z85828 Personal history of other malignant neoplasm of skin: Secondary | ICD-10-CM | POA: Diagnosis not present

## 2018-04-04 DIAGNOSIS — J441 Chronic obstructive pulmonary disease with (acute) exacerbation: Secondary | ICD-10-CM | POA: Diagnosis present

## 2018-04-04 DIAGNOSIS — G629 Polyneuropathy, unspecified: Secondary | ICD-10-CM | POA: Diagnosis not present

## 2018-04-04 DIAGNOSIS — R05 Cough: Secondary | ICD-10-CM | POA: Diagnosis not present

## 2018-04-04 DIAGNOSIS — I252 Old myocardial infarction: Secondary | ICD-10-CM

## 2018-04-04 DIAGNOSIS — Z9221 Personal history of antineoplastic chemotherapy: Secondary | ICD-10-CM

## 2018-04-04 LAB — COMPREHENSIVE METABOLIC PANEL
ALT: 27 U/L (ref 17–63)
AST: 35 U/L (ref 15–41)
Albumin: 2.6 g/dL — ABNORMAL LOW (ref 3.5–5.0)
Alkaline Phosphatase: 90 U/L (ref 38–126)
Anion gap: 13 (ref 5–15)
BILIRUBIN TOTAL: 0.5 mg/dL (ref 0.3–1.2)
BUN: 17 mg/dL (ref 6–20)
CO2: 29 mmol/L (ref 22–32)
Calcium: 9.2 mg/dL (ref 8.9–10.3)
Chloride: 99 mmol/L — ABNORMAL LOW (ref 101–111)
Creatinine, Ser: 1.24 mg/dL (ref 0.61–1.24)
GFR calc Af Amer: 56 mL/min — ABNORMAL LOW (ref >60)
GFR, EST NON AFRICAN AMERICAN: 49 mL/min — AB (ref >60)
Glucose, Bld: 115 mg/dL — ABNORMAL HIGH (ref 65–99)
POTASSIUM: 3.5 mmol/L (ref 3.5–5.1)
Sodium: 141 mmol/L (ref 135–145)
TOTAL PROTEIN: 6.9 g/dL (ref 6.5–8.1)

## 2018-04-04 LAB — CBC WITH DIFFERENTIAL/PLATELET
Abs Immature Granulocytes: 0.2 10*3/uL — ABNORMAL HIGH (ref 0.0–0.1)
BASOS PCT: 1 %
Basophils Absolute: 0.1 10*3/uL (ref 0.0–0.1)
EOS ABS: 0.1 10*3/uL (ref 0.0–0.7)
EOS PCT: 1 %
HCT: 44.4 % (ref 39.0–52.0)
HEMOGLOBIN: 14.2 g/dL (ref 13.0–17.0)
IMMATURE GRANULOCYTES: 3 %
Lymphocytes Relative: 39 %
Lymphs Abs: 2.8 10*3/uL (ref 0.7–4.0)
MCH: 32.6 pg (ref 26.0–34.0)
MCHC: 32 g/dL (ref 30.0–36.0)
MCV: 102.1 fL — ABNORMAL HIGH (ref 78.0–100.0)
MONO ABS: 0.9 10*3/uL (ref 0.1–1.0)
MONOS PCT: 13 %
NEUTROS PCT: 43 %
Neutro Abs: 3.1 10*3/uL (ref 1.7–7.7)
PLATELETS: 301 10*3/uL (ref 150–400)
RBC: 4.35 MIL/uL (ref 4.22–5.81)
RDW: 15 % (ref 11.5–15.5)
WBC: 7.2 10*3/uL (ref 4.0–10.5)

## 2018-04-04 LAB — PROTIME-INR
INR: 2.36
Prothrombin Time: 25.6 seconds — ABNORMAL HIGH (ref 11.4–15.2)

## 2018-04-04 LAB — BRAIN NATRIURETIC PEPTIDE: B NATRIURETIC PEPTIDE 5: 156.3 pg/mL — AB (ref 0.0–100.0)

## 2018-04-04 LAB — TROPONIN I: Troponin I: 0.03 ng/mL (ref <0.03)

## 2018-04-04 MED ORDER — PREDNISONE 20 MG PO TABS
40.0000 mg | ORAL_TABLET | Freq: Every day | ORAL | Status: DC
  Administered 2018-04-05: 40 mg via ORAL
  Filled 2018-04-04: qty 2

## 2018-04-04 MED ORDER — IPRATROPIUM BROMIDE 0.02 % IN SOLN
0.5000 mg | Freq: Four times a day (QID) | RESPIRATORY_TRACT | Status: DC
  Administered 2018-04-04 – 2018-04-05 (×4): 0.5 mg via RESPIRATORY_TRACT
  Filled 2018-04-04 (×5): qty 2.5

## 2018-04-04 MED ORDER — ATENOLOL 50 MG PO TABS
50.0000 mg | ORAL_TABLET | Freq: Every day | ORAL | Status: DC
  Administered 2018-04-04 – 2018-04-07 (×4): 50 mg via ORAL
  Filled 2018-04-04 (×4): qty 1

## 2018-04-04 MED ORDER — GABAPENTIN 100 MG PO CAPS
100.0000 mg | ORAL_CAPSULE | Freq: Three times a day (TID) | ORAL | Status: DC
  Administered 2018-04-04 – 2018-04-07 (×7): 100 mg via ORAL
  Filled 2018-04-04 (×8): qty 1

## 2018-04-04 MED ORDER — SODIUM CHLORIDE 0.9% FLUSH
3.0000 mL | INTRAVENOUS | Status: DC | PRN
  Administered 2018-04-04 – 2018-04-06 (×2): 3 mL via INTRAVENOUS
  Filled 2018-04-04: qty 3

## 2018-04-04 MED ORDER — ASPIRIN 81 MG PO CHEW
81.0000 mg | CHEWABLE_TABLET | ORAL | Status: DC
  Administered 2018-04-06: 81 mg via ORAL
  Filled 2018-04-04: qty 1

## 2018-04-04 MED ORDER — SODIUM CHLORIDE 0.9% FLUSH
3.0000 mL | Freq: Two times a day (BID) | INTRAVENOUS | Status: DC
  Administered 2018-04-04 – 2018-04-07 (×6): 3 mL via INTRAVENOUS

## 2018-04-04 MED ORDER — POLYETHYLENE GLYCOL 3350 17 G PO PACK: 17.0000 g | PACK | Freq: Every day | ORAL | Status: DC | PRN

## 2018-04-04 MED ORDER — COLCHICINE 0.6 MG PO TABS
0.6000 mg | ORAL_TABLET | Freq: Every day | ORAL | Status: DC | PRN
Start: 2018-04-04 — End: 2018-04-07

## 2018-04-04 MED ORDER — ALLOPURINOL 300 MG PO TABS
300.0000 mg | ORAL_TABLET | Freq: Every day | ORAL | Status: DC
  Administered 2018-04-05 – 2018-04-07 (×3): 300 mg via ORAL
  Filled 2018-04-04 (×3): qty 1

## 2018-04-04 MED ORDER — ONDANSETRON HCL 4 MG/2ML IJ SOLN: 4.0000 mg | Freq: Four times a day (QID) | INTRAMUSCULAR | Status: DC | PRN

## 2018-04-04 MED ORDER — WARFARIN - PHARMACIST DOSING INPATIENT: Freq: Every day | Status: DC

## 2018-04-04 MED ORDER — IPRATROPIUM-ALBUTEROL 0.5-2.5 (3) MG/3ML IN SOLN
3.0000 mL | Freq: Once | RESPIRATORY_TRACT | Status: AC
Start: 2018-04-04 — End: 2018-04-04
  Administered 2018-04-04: 3 mL via RESPIRATORY_TRACT
  Filled 2018-04-04: qty 3

## 2018-04-04 MED ORDER — SODIUM CHLORIDE 0.9 % IV SOLN: 250.0000 mL | INTRAVENOUS | Status: DC | PRN

## 2018-04-04 MED ORDER — DOXYCYCLINE HYCLATE 100 MG PO TABS
100.0000 mg | ORAL_TABLET | Freq: Two times a day (BID) | ORAL | Status: DC
  Administered 2018-04-04 – 2018-04-05 (×2): 100 mg via ORAL
  Filled 2018-04-04 (×2): qty 1

## 2018-04-04 MED ORDER — BENZONATATE 100 MG PO CAPS: 200.0000 mg | ORAL_CAPSULE | Freq: Two times a day (BID) | ORAL | Status: DC | PRN

## 2018-04-04 MED ORDER — NITROGLYCERIN 0.4 MG SL SUBL: 0.4000 mg | SUBLINGUAL_TABLET | SUBLINGUAL | Status: DC | PRN

## 2018-04-04 MED ORDER — POTASSIUM CHLORIDE CRYS ER 20 MEQ PO TBCR
40.0000 meq | EXTENDED_RELEASE_TABLET | Freq: Two times a day (BID) | ORAL | Status: DC
  Administered 2018-04-04 – 2018-04-07 (×6): 40 meq via ORAL
  Filled 2018-04-04 (×6): qty 2

## 2018-04-04 MED ORDER — ACETAMINOPHEN 500 MG PO TABS
500.0000 mg | ORAL_TABLET | Freq: Every day | ORAL | Status: DC
  Administered 2018-04-04 – 2018-04-06 (×3): 500 mg via ORAL
  Filled 2018-04-04 (×3): qty 1

## 2018-04-04 MED ORDER — FUROSEMIDE 10 MG/ML IJ SOLN
80.0000 mg | Freq: Two times a day (BID) | INTRAMUSCULAR | Status: DC
  Administered 2018-04-04 – 2018-04-07 (×6): 80 mg via INTRAVENOUS
  Filled 2018-04-04 (×6): qty 8

## 2018-04-04 MED ORDER — PREDNISONE 20 MG PO TABS
60.0000 mg | ORAL_TABLET | Freq: Once | ORAL | Status: AC
  Administered 2018-04-04: 60 mg via ORAL
  Filled 2018-04-04: qty 3

## 2018-04-04 MED ORDER — MOMETASONE FURO-FORMOTEROL FUM 200-5 MCG/ACT IN AERO
2.0000 | INHALATION_SPRAY | Freq: Two times a day (BID) | RESPIRATORY_TRACT | Status: DC
  Administered 2018-04-05 – 2018-04-07 (×4): 2 via RESPIRATORY_TRACT
  Filled 2018-04-04 (×2): qty 8.8

## 2018-04-04 MED ORDER — ACETAMINOPHEN 325 MG PO TABS: 650.0000 mg | ORAL_TABLET | ORAL | Status: DC | PRN

## 2018-04-04 MED ORDER — VITAMIN D 1000 UNITS PO TABS
4000.0000 [IU] | ORAL_TABLET | Freq: Every day | ORAL | Status: DC
  Administered 2018-04-04 – 2018-04-07 (×4): 4000 [IU] via ORAL
  Filled 2018-04-04 (×4): qty 4

## 2018-04-04 MED ORDER — ALBUTEROL SULFATE (2.5 MG/3ML) 0.083% IN NEBU: 2.5000 mg | INHALATION_SOLUTION | RESPIRATORY_TRACT | Status: DC | PRN

## 2018-04-04 MED ORDER — ALPRAZOLAM 0.5 MG PO TABS
0.5000 mg | ORAL_TABLET | Freq: Every day | ORAL | Status: DC
  Administered 2018-04-04 – 2018-04-06 (×3): 0.5 mg via ORAL
  Filled 2018-04-04 (×3): qty 1

## 2018-04-04 MED ORDER — SENNOSIDES-DOCUSATE SODIUM 8.6-50 MG PO TABS
2.0000 | ORAL_TABLET | Freq: Every day | ORAL | Status: DC
  Administered 2018-04-04 – 2018-04-06 (×3): 2 via ORAL
  Filled 2018-04-04 (×3): qty 2

## 2018-04-04 MED ORDER — IPRATROPIUM-ALBUTEROL 0.5-2.5 (3) MG/3ML IN SOLN
3.0000 mL | Freq: Once | RESPIRATORY_TRACT | Status: AC
  Administered 2018-04-04: 3 mL via RESPIRATORY_TRACT
  Filled 2018-04-04: qty 3

## 2018-04-04 MED ORDER — WARFARIN SODIUM 2.5 MG PO TABS
2.5000 mg | ORAL_TABLET | Freq: Once | ORAL | Status: AC
  Administered 2018-04-04: 2.5 mg via ORAL
  Filled 2018-04-04: qty 1

## 2018-04-04 NOTE — ED Triage Notes (Signed)
Pt arrives to ED from home with complaints of SOb since a month ago. EMS reports pt was admitted for pneumonia approx 1 month ago and was discharged to [facility] for rehab until his insurance ran out and he was sent home with no prescriptions for continued antibiotics for his pneumonia. Pt placed in position of comfort with bed locked and lowered, call bell in reach.

## 2018-04-04 NOTE — ED Notes (Signed)
ED Provider made aware of critical troponin value of 0.03.

## 2018-04-04 NOTE — ED Provider Notes (Signed)
[facility] EMERGENCY DEPARTMENT Provider Note   CSN: [id number] Arrival date & time: 04/04/18  1628     History   Chief Complaint Chief Complaint  Patient presents with  . Pneumonia    HPI [name] is a 84 y.o. male.  HPI  84 year old male with a history of CHF and COPD presents with shortness of breath and cough.  He recently was admitted to the hospital for pneumonia and discharged to [facility].  He was released from there couple days ago, because the patient states that his insurance stopped paying for. No chest pain. Chronic leg swelling that's unchanged.  Past Medical History:  Diagnosis Date  . Arrhythmia    afib  . Arthritis    "back and hands" (09/26/2015)  . Basal cell carcinoma   . CHF (congestive heart failure) ([place])   . Coronary artery disease   . CVI (common variable immunodeficiency) ([place])   . DJD (degenerative joint disease)   . ED (erectile dysfunction)   . GERD (gastroesophageal reflux disease)   . Gout   . Heart attack ([place]) 1986  . Hyperlipidemia   . Hypertension   . IFG (impaired fasting glucose)   . Neuropathy   . Non Hodgkin's lymphoma ([place]) dx'd 2006  . PAC (premature atrial contraction)   . PE (pulmonary embolism) 2007   "when I was taking chemo"  . Peptic ulcer disease   . Pneumonia ~ 2007 X 2  . Squamous carcinoma   . Venous stasis ulcers [facility])     Patient Active Problem List   Diagnosis Date Noted  . COPD with acute exacerbation ([place]) 04/04/2018  . Acute on chronic diastolic CHF (congestive heart failure) ([place]) 02/28/2018  . Acute bronchospasm   . CHF (congestive heart failure) ([place])   . Acute respiratory failure ([place]) 08/17/2017  . Dyspnea 08/15/2017  . Pressure injury of skin 03/22/2017  . Weakness 03/21/2017  . Fever in adult 09/26/2015  . Gout 09/26/2015  . Leukocytosis 09/26/2015  . Venous stasis dermatitis of both lower extremities 07/17/2015  . Bilateral lower leg cellulitis 07/09/2015  .  Sepsis due to other organism, without resultant organ failure 07/07/2015  . Chronic combined systolic and diastolic CHF (congestive heart failure) ([place]) 07/07/2015  . Onychomycosis 12/04/2013  . Hx of CABG 04/25/2013  . Coronary artery disease 11/29/2011  . Venous stasis ulcer ([place]) 11/29/2011  . Neuropathy 11/29/2011  . Generalized weakness 11/29/2011  . Atrial fibrillation ([place]) 12/02/2010  . PPM-[place] 12/02/2010    Past Surgical History:  Procedure Laterality Date  . BACK SURGERY    . CARDIAC CATHETERIZATION  1990  . CATARACT EXTRACTION W/ INTRAOCULAR LENS  IMPLANT, BILATERAL Bilateral 10/2006 ~ 11/2006  . CORONARY ANGIOPLASTY    . CORONARY ARTERY BYPASS GRAFT  1990   CABG X3  . ELBOW SURGERY Left    "related to gout"  . INSERT / REPLACE / REMOVE PACEMAKER  08/2010  . KNEE ARTHROSCOPY Left   . LUMBAR DISC SURGERY     "had pinched nerve; had to make room for it"  . NASAL SINUS SURGERY  1980   "related to bacteria infection in sinus"  . SQUAMOUS CELL CARCINOMA EXCISION Right 09/12/2015   cheek  . TONSILLECTOMY  1972        Home Medications    Prior to Admission medications   Medication Sig Start Date End Date Taking? Authorizing Provider  acetaminophen (TYLENOL) 500 MG tablet Take 500 mg by mouth at bedtime.  Yes [provider]  albuterol (PROVENTIL) (2.5 MG/3ML) 0.083% nebulizer solution Take 3 mLs (2.5 mg total) by nebulization every 2 (two) hours as needed for wheezing or shortness of breath. Patient taking differently: Take 2.5 mg by nebulization every 6 (six) hours.  03/04/18  Yes Hosie Poisson, MD  allopurinol (ZYLOPRIM) 300 MG tablet Take 300 mg by mouth daily.    Yes [provider]  ALPRAZolam Duanne Moron) 0.5 MG tablet Take 1 tablet (0.5 mg total) by mouth at bedtime as needed for anxiety. For sleep Patient taking differently: Take 0.5 mg by mouth at bedtime. For sleep 03/23/17  Yes Geradine Girt, DO  aspirin EC 81 MG tablet Take 81 mg by mouth  every other day.   Yes [provider]  atenolol (TENORMIN) 50 MG tablet Take 50 mg by mouth daily.    Yes [provider]  Cholecalciferol (VITAMIN D) 2000 units tablet Take 4,000 Units by mouth daily.   Yes [provider]  clotrimazole-betamethasone (LOTRISONE) cream Apply 1 application topically 2 (two) times daily as needed (rash).  08/11/17  Yes [provider]  colchicine 0.6 MG tablet Take 0.6 mg by mouth daily as needed (gout).    Yes [provider]  furosemide (LASIX) 40 MG tablet 80 mg every AM and 40 mg every PM Patient taking differently: Take 40-80 mg by mouth See admin instructions. Take 2 tablets (80 mg) by mouth every morning and 1 tablet (40 mg) at night 08/18/17  Yes Vann, Jessica U, DO  gabapentin (NEURONTIN) 100 MG capsule Take 100 mg by mouth at bedtime as needed (nerve pain).    Yes [provider]  mometasone-formoterol (DULERA) 200-5 MCG/ACT AERO Inhale 2 puffs into the lungs 2 (two) times daily. Patient taking differently: Inhale 2 puffs into the lungs 2 (two) times daily as needed for wheezing or shortness of breath.  03/04/18  Yes Hosie Poisson, MD  nitroGLYCERIN (NITROSTAT) 0.4 MG SL tablet Place 0.4 mg under the tongue every 5 (five) minutes as needed for chest pain.   Yes [provider]  nystatin (MYCOSTATIN) 100000 UNIT/ML suspension Take 5 mLs by mouth 2 (two) times daily. Swish and swallow 03/08/18  Yes [provider]  potassium chloride SA (K-DUR,KLOR-CON) 20 MEQ tablet Take 40 mEq by mouth 2 (two) times daily.    Yes [provider]  PRESCRIPTION MEDICATION Apply 1 application topically See admin instructions. Apply prescription powder daily after bath - for rash   Yes [provider]  sennosides-docusate sodium (SENOKOT-S) 8.6-50 MG tablet Take 2 tablets by mouth at bedtime as needed for constipation.    Yes [provider]  warfarin (COUMADIN) 2.5 MG tablet Take 1  tablet (2.5 mg total) by mouth daily at 6 PM. Or as directed 03/05/18  Yes Hosie Poisson, MD  albuterol (PROVENTIL) (2.5 MG/3ML) 0.083% nebulizer solution Take 3 mLs (2.5 mg total) by nebulization every 4 (four) hours as needed for wheezing or shortness of breath. Patient not taking: Reported on 04/04/2018 08/18/17   Geradine Girt, DO  benzonatate (TESSALON) 200 MG capsule Take 1 capsule (200 mg total) by mouth 2 (two) times daily as needed for cough. Patient not taking: Reported on 04/04/2018 03/04/18   Hosie Poisson, MD  erythromycin ophthalmic ointment See admin instructions. Apply to eyelids into both eyes at bedtime for 10 days (filled 03/14/18) 03/14/18   [provider]  ipratropium-albuterol (DUONEB) 0.5-2.5 (3) MG/3ML SOLN Take 3 mLs by nebulization every 6 (six)  hours as needed. Patient not taking: Reported on 04/04/2018 03/04/18   Hosie Poisson, MD  valACYclovir (VALTREX) 500 MG tablet Take 500 mg by mouth 3 (three) times daily. 14 day course filled 03/15/18 03/15/18   [provider]    Family History Family History  Problem Relation Age of Onset  . Obesity Mother   . Lung disease Father   . Diabetes type II Sister   . Heart disease Brother   . Diabetes type II Daughter     Social History Social History   Tobacco Use  . Smoking status: Former Smoker    Packs/day: 1.00    Years: 1.00    Pack years: 1.00    Types: Cigarettes  . Smokeless tobacco: Never Used  Substance Use Topics  . Alcohol use: No  . Drug use: No     Allergies   Avelox [moxifloxacin hcl in nacl]; Indomethacin; Levaquin [levofloxacin]; Lipitor [atorvastatin]; Moxifloxacin; Pravachol [pravastatin sodium]; Aleve [naproxen sodium]; Clindamycin/lincomycin; Norpace [disopyramide phosphate]; Disopyramide; Morphine; Pravastatin; Amlodipine besylate; Azithromycin; Bactrim [sulfamethoxazole-trimethoprim]; Clinoril [sulindac]; Codeine; Digoxin; Lorazepam; Morphine and related; Other;  Oxycodone-acetaminophen; Percocet [oxycodone-acetaminophen]; Sulfamethoxazole-trimethoprim; Sulfonamide derivatives; and Uloric [febuxostat]   Review of Systems Review of Systems  Constitutional: Negative for fever.  Respiratory: Positive for cough, shortness of breath and wheezing.   Cardiovascular: Positive for leg swelling. Negative for chest pain.  All other systems reviewed and are negative.    Physical Exam Updated Vital Signs BP (!) 115/54   Pulse 69   Temp 97.8 F (36.6 C) (Oral)   Resp 13   Ht 5\' 6"  (1.676 m)   Wt 84.8 kg (187 lb)   SpO2 98%   BMI 30.18 kg/m   Physical Exam  Constitutional: He is oriented to person, place, and time. He appears well-developed and well-nourished. No distress.  HENT:  Head: Normocephalic and atraumatic.  Right Ear: External ear normal.  Left Ear: External ear normal.  Nose: Nose normal.  Eyes: Right eye exhibits no discharge. Left eye exhibits no discharge.  Neck: Neck supple.  Cardiovascular: Normal rate, regular rhythm and normal heart sounds.  Pulmonary/Chest: Effort normal. He has wheezes. He has rales.  Abdominal: Soft. He exhibits no distension. There is no tenderness.  Musculoskeletal: He exhibits edema (trace lower extremity edema bilaterally).  Neurological: He is alert and oriented to person, place, and time.  Skin: Skin is warm and dry. He is not diaphoretic.  Nursing note and vitals reviewed.    ED Treatments / Results  Labs (all labs ordered are listed, but only abnormal results are displayed) Labs Reviewed  COMPREHENSIVE METABOLIC PANEL - Abnormal; Notable for the following components:      Result Value   Chloride 99 (*)    Glucose, Bld 115 (*)    Albumin 2.6 (*)    GFR calc non Af Amer 49 (*)    GFR calc Af Amer 56 (*)    All other components within normal limits  BRAIN NATRIURETIC PEPTIDE - Abnormal; Notable for the following components:   B Natriuretic Peptide 156.3 (*)    All other components within  normal limits  TROPONIN I - Abnormal; Notable for the following components:   Troponin I 0.03 (*)    All other components within normal limits  CBC WITH DIFFERENTIAL/PLATELET - Abnormal; Notable for the following components:   MCV 102.1 (*)    Abs Immature Granulocytes 0.2 (*)    All other components within normal limits  PROTIME-INR - Abnormal; Notable for the following  components:   Prothrombin Time 25.6 (*)    All other components within normal limits  BASIC METABOLIC PANEL  PROTIME-INR    EKG EKG Interpretation  Date/Time:  Monday Apr 04 2018 16:54:45 EDT Ventricular Rate:  124 PR Interval:    QRS Duration: 197 QT Interval:  566 QTC Calculation: 810 R Axis:   -53 Text Interpretation:  Atrial fibrillation Left bundle branch block Baseline wander in lead(s) V5 Confirmed by Sherwood Gambler [phone]) on 04/04/2018 7:00:15 PM   Radiology Dg Chest 2 View  Result Date: 04/04/2018 CLINICAL DATA:  Cough EXAM: CHEST - 2 VIEW COMPARISON:  CT chest 03/01/2018 FINDINGS: There is mild bilateral interstitial prominence. There is no focal consolidation. There is no pleural effusion or pneumothorax. The heart mediastinum are normal. There is evidence of prior CABG. There is a dual lead cardiac pacemaker. The osseous structures are unremarkable. IMPRESSION: Mild bilateral interstitial prominence concerning for interstitial edema. Electronically Signed   By: Kathreen Devoid   On: 04/04/2018 17:49    Procedures Procedures (including critical care time)  Medications Ordered in ED Medications  sodium chloride flush (NS) 0.9 % injection 3 mL (has no administration in time range)  sodium chloride flush (NS) 0.9 % injection 3 mL (3 mLs Intravenous Given 04/04/18 2039)  0.9 %  sodium chloride infusion (has no administration in time range)  acetaminophen (TYLENOL) tablet 650 mg (has no administration in time range)  ondansetron (ZOFRAN) injection 4 mg (has no administration in time range)  furosemide  (LASIX) injection 80 mg (80 mg Intravenous Given 04/04/18 2038)  predniSONE (DELTASONE) tablet 40 mg (has no administration in time range)  mometasone-formoterol (DULERA) 200-5 MCG/ACT inhaler 2 puff (2 puffs Inhalation Not Given 04/04/18 2158)  albuterol (PROVENTIL) (2.5 MG/3ML) 0.083% nebulizer solution 2.5 mg (has no administration in time range)  ipratropium (ATROVENT) nebulizer solution 0.5 mg (0.5 mg Nebulization Given 04/04/18 2037)  doxycycline (VIBRA-TABS) tablet 100 mg (100 mg Oral Given 04/04/18 2127)  acetaminophen (TYLENOL) tablet 500 mg (500 mg Oral Given 04/04/18 2126)  allopurinol (ZYLOPRIM) tablet 300 mg (has no administration in time range)  ALPRAZolam Duanne Moron) tablet 0.5 mg (0.5 mg Oral Given 04/04/18 2126)  aspirin chewable tablet 81 mg (0 mg Oral Hold 04/04/18 2129)  atenolol (TENORMIN) tablet 50 mg (50 mg Oral Given 04/04/18 2127)  benzonatate (TESSALON) capsule 200 mg (has no administration in time range)  cholecalciferol (VITAMIN D) tablet 4,000 Units (4,000 Units Oral Given 04/04/18 2125)  colchicine tablet 0.6 mg (has no administration in time range)  gabapentin (NEURONTIN) capsule 100 mg (100 mg Oral Given 04/04/18 2126)  polyethylene glycol (MIRALAX / GLYCOLAX) packet 17 g (has no administration in time range)  potassium chloride SA (K-DUR,KLOR-CON) CR tablet 40 mEq (40 mEq Oral Given 04/04/18 2125)  nitroGLYCERIN (NITROSTAT) SL tablet 0.4 mg (has no administration in time range)  senna-docusate (Senokot-S) tablet 2 tablet (2 tablets Oral Given 04/04/18 2127)  warfarin (COUMADIN) tablet 2.5 mg (has no administration in time range)  Warfarin - Pharmacist Dosing Inpatient (has no administration in time range)  ipratropium-albuterol (DUONEB) 0.5-2.5 (3) MG/3ML nebulizer solution 3 mL (3 mLs Nebulization Given 04/04/18 1659)  predniSONE (DELTASONE) tablet 60 mg (60 mg Oral Given 04/04/18 1819)  ipratropium-albuterol (DUONEB) 0.5-2.5 (3) MG/3ML nebulizer solution 3 mL (3 mLs  Nebulization Given 04/04/18 1941)     Initial Impression / Assessment and Plan / ED Course  I have reviewed the triage vital signs and the nursing notes.  Pertinent labs & imaging  results that were available during my care of the patient were reviewed by me and considered in my medical decision making (see chart for details).     The patient is likely suffering from a COPD exacerbation.  He currently lives at home by himself and with his increased shortness of breath and wheezing and poor ability to take care of himself, I think he will need to be admitted.  He is not in distress or hypoxic.  I do not think there is a pneumonia.  There is some question of vascular congestion but I think CHF is less likely.  He will be admitted to the hospitalist service.  Final Clinical Impressions(s) / ED Diagnoses   Final diagnoses:  COPD exacerbation [facility])    ED Discharge Orders    None       Sherwood Gambler, MD 04/04/18 2202

## 2018-04-04 NOTE — H&P (Signed)
History and Physical    [name] KYH:[id number] DOB: 17-Feb-1923 DOA: 04/04/2018  PCP: Lavone Orn, MD  Patient coming from: Home  I have personally briefly reviewed patient's old medical records in [place]  Chief Complaint: SOB  HPI: [name] is a 84 y.o. male with medical history significant of CHF, ICM with preserved EF 55-60% with apical hypokenesis on last echo, CABG, COPD.  Patient recently discharged from SNF a few days ago because insurance declined to continue to pay for SNF.  At home family used compression pumps for BLE peripheral edema.  LE edema improved, but breathing became worse.  Patient presents to ED with SOB, cough, congestion, wheezing.   ED Course: CXR shows mild pulm edema.  Patient given neb treatment and prednisone with some breathing improvement.   Review of Systems: As per HPI otherwise 10 point review of systems negative.   Past Medical History:  Diagnosis Date  . Arrhythmia    afib  . Arthritis    "back and hands" (09/26/2015)  . Basal cell carcinoma   . CHF (congestive heart failure) ([place])   . Coronary artery disease   . CVI (common variable immunodeficiency) ([place])   . DJD (degenerative joint disease)   . ED (erectile dysfunction)   . GERD (gastroesophageal reflux disease)   . Gout   . Heart attack ([place]) 1986  . Hyperlipidemia   . Hypertension   . IFG (impaired fasting glucose)   . Neuropathy   . Non Hodgkin's lymphoma ([place]) dx'd 2006  . PAC (premature atrial contraction)   . PE (pulmonary embolism) 2007   "when I was taking chemo"  . Peptic ulcer disease   . Pneumonia ~ 2007 X 2  . Squamous carcinoma   . Venous stasis ulcers (HCC)     Past Surgical History:  Procedure Laterality Date  . BACK SURGERY    . CARDIAC CATHETERIZATION  1990  . CATARACT EXTRACTION W/ INTRAOCULAR LENS  IMPLANT, BILATERAL Bilateral 10/2006 ~ 11/2006  . CORONARY ANGIOPLASTY    . CORONARY ARTERY BYPASS GRAFT  1990   CABG X3  .  ELBOW SURGERY Left    "related to gout"  . INSERT / REPLACE / REMOVE PACEMAKER  08/2010  . KNEE ARTHROSCOPY Left   . LUMBAR DISC SURGERY     "had pinched nerve; had to make room for it"  . NASAL SINUS SURGERY  1980   "related to bacteria infection in sinus"  . SQUAMOUS CELL CARCINOMA EXCISION Right 09/12/2015   cheek  . TONSILLECTOMY  1972     reports that he has quit smoking. His smoking use included cigarettes. He has a 1.00 pack-year smoking history. He has never used smokeless tobacco. He reports that he does not drink alcohol or use drugs.  Allergies  Allergen Reactions  . Avelox [Moxifloxacin Hcl In Nacl] Other (See Comments)    Messed up lungs  . Indomethacin Other (See Comments)    Renal insufficiency  . Levaquin [Levofloxacin] Other (See Comments)    Messed up lungs  . Lipitor [Atorvastatin] Nausea Only, Palpitations and Other (See Comments)    Irregular heart beat also  . Moxifloxacin Other (See Comments)    Respiratory distress  . Pravachol [Pravastatin Sodium] Nausea Only, Palpitations and Other (See Comments)  . Aleve [Naproxen Sodium] Nausea And Vomiting and Other (See Comments)    GI Upset  . Norpace [Disopyramide Phosphate] Other (See Comments)    Urinary retention  . Disopyramide Other (See  Comments)    Urinary retention  . Morphine Nausea Only    Irregular hear beat  . Pravastatin Nausea Only    Irregular heart beat  . Amlodipine Besylate Rash  . Azithromycin Rash  . Bactrim [Sulfamethoxazole-Trimethoprim] Rash  . Clinoril [Sulindac] Rash  . Codeine Other (See Comments)    unknown  . Digoxin Rash  . Lorazepam Other (See Comments)    unknown  . Morphine And Related Other (See Comments)    unknown  . Other Other (See Comments)    Tripilenomenon? Per paperwork  . Oxycodone-Acetaminophen Rash  . Percocet [Oxycodone-Acetaminophen] Rash  . Sulfamethoxazole-Trimethoprim Rash  . Sulfonamide Derivatives Rash  . Uloric [Febuxostat] Other (See Comments)     unknown    Family History  Problem Relation Age of Onset  . Obesity Mother   . Lung disease Father   . Diabetes type II Sister   . Heart disease Brother   . Diabetes type II Daughter      Prior to Admission medications   Medication Sig Start Date End Date Taking? Authorizing Provider  acetaminophen (TYLENOL) 500 MG tablet Take 500 mg by mouth at bedtime.     [provider]  albuterol (PROVENTIL) (2.5 MG/3ML) 0.083% nebulizer solution Take 3 mLs (2.5 mg total) by nebulization every 4 (four) hours as needed for wheezing or shortness of breath. 08/18/17   Geradine Girt, DO  albuterol (PROVENTIL) (2.5 MG/3ML) 0.083% nebulizer solution Take 3 mLs (2.5 mg total) by nebulization every 2 (two) hours as needed for wheezing or shortness of breath. 03/04/18   Hosie Poisson, MD  allopurinol (ZYLOPRIM) 300 MG tablet Take 300 mg by mouth daily.     [provider]  ALPRAZolam Duanne Moron) 0.5 MG tablet Take 1 tablet (0.5 mg total) by mouth at bedtime as needed for anxiety. For sleep Patient taking differently: Take 0.5 mg by mouth at bedtime. For sleep 03/23/17   Geradine Girt, DO  aspirin 81 MG tablet Take 81 mg by mouth every other day.     [provider]  atenolol (TENORMIN) 50 MG tablet Take 50 mg by mouth daily.     [provider]  benzonatate (TESSALON) 200 MG capsule Take 1 capsule (200 mg total) by mouth 2 (two) times daily as needed for cough. 03/04/18   Hosie Poisson, MD  Cholecalciferol 2000 UNITS CAPS Take 4,000 Units by mouth daily.    [provider]  clotrimazole-betamethasone (LOTRISONE) cream Apply 1 application topically as directed. 08/11/17   [provider]  colchicine 0.6 MG tablet Take 0.6 mg by mouth daily as needed (gout).     [provider]  furosemide (LASIX) 40 MG tablet 80 mg every AM and 40 mg every PM 08/18/17   Eulogio Bear U, DO  gabapentin (NEURONTIN) 100 MG capsule Take 100 mg by mouth 3 (three) times  daily. Takes at 0900,1700,1900 daily    [provider]  ipratropium-albuterol (DUONEB) 0.5-2.5 (3) MG/3ML SOLN Take 3 mLs by nebulization every 6 (six) hours as needed. 03/04/18   Hosie Poisson, MD  mometasone-formoterol (DULERA) 200-5 MCG/ACT AERO Inhale 2 puffs into the lungs 2 (two) times daily. 03/04/18   Hosie Poisson, MD  nitroGLYCERIN (NITROSTAT) 0.4 MG SL tablet Place 0.4 mg under the tongue every 5 (five) minutes as needed for chest pain.    [provider]  polyethylene glycol (MIRALAX / GLYCOLAX) packet Take 17 g by mouth daily as needed for mild constipation.    [provider]  potassium chloride SA (K-DUR,KLOR-CON) 20 MEQ tablet Take 40 mEq by mouth 2 (two) times daily.     [provider]  sennosides-docusate sodium (SENOKOT-S) 8.6-50 MG tablet Take 2 tablets by mouth at bedtime.    [provider]  triamcinolone cream (KENALOG) 0.1 % Apply 1 application topically daily as needed for rash. 03/10/17   [provider]  warfarin (COUMADIN) 2.5 MG tablet Take 1 tablet (2.5 mg total) by mouth daily at 6 PM. Or as directed 03/05/18   Hosie Poisson, MD    Physical Exam: Vitals:   04/04/18 1830 04/04/18 1845 04/04/18 1900 04/04/18 1915  BP: (!) 124/55 139/66 (!) 117/52 (!) 115/54  Pulse: 70 73 61 69  Resp: 15 18 20 13   Temp:      TempSrc:      SpO2: 98% 100% 99% 98%  Weight:      Height:        Constitutional: NAD, calm, comfortable Eyes: PERRL, lids and conjunctivae normal ENMT: Mucous membranes are moist. Posterior pharynx clear of any exudate or lesions.Normal dentition.  Neck: normal, supple, no masses, no thyromegaly Respiratory: Faint wheezes, loud crackles. Cardiovascular: IRR, IRR Abdomen: no tenderness, no masses palpated. No hepatosplenomegaly. Bowel sounds positive.  Musculoskeletal: no clubbing / cyanosis. No joint deformity upper and lower extremities. Good ROM, no contractures. Normal muscle tone.  Skin: no rashes,  lesions, ulcers. No induration Neurologic: CN 2-12 grossly intact. Sensation intact, DTR normal. Strength 5/5 in all 4.  Psychiatric: Normal judgment and insight. Alert and oriented x 3. Normal mood.    Labs on Admission: I have personally reviewed following labs and imaging studies  CBC: Recent Labs  Lab 04/04/18 1706  WBC 7.2  NEUTROABS 3.1  HGB 14.2  HCT 44.4  MCV 102.1*  PLT 196   Basic Metabolic Panel: Recent Labs  Lab 04/04/18 1706  NA 141  K 3.5  CL 99*  CO2 29  GLUCOSE 115*  BUN 17  CREATININE 1.24  CALCIUM 9.2   GFR: Estimated Creatinine Clearance: 38.8 mL/min (by C-G formula based on SCr of 1.24 mg/dL). Liver Function Tests: Recent Labs  Lab 04/04/18 1706  AST 35  ALT 27  ALKPHOS 90  BILITOT 0.5  PROT 6.9  ALBUMIN 2.6*   No results for input(s): LIPASE, AMYLASE in the last 168 hours. No results for input(s): AMMONIA in the last 168 hours. Coagulation Profile: Recent Labs  Lab 04/04/18 1655  INR 2.36   Cardiac Enzymes: Recent Labs  Lab 04/04/18 1706  TROPONINI 0.03*   BNP (last 3 results) No results for input(s): PROBNP in the last 8760 hours. HbA1C: No results for input(s): HGBA1C in the last 72 hours. CBG: No results for input(s): GLUCAP in the last 168 hours. Lipid Profile: No results for input(s): CHOL, HDL, LDLCALC, TRIG, CHOLHDL, LDLDIRECT in the last 72 hours. Thyroid Function Tests: No results for input(s): TSH, T4TOTAL, FREET4, T3FREE, THYROIDAB in the last 72 hours. Anemia Panel: No results for input(s): VITAMINB12, FOLATE, FERRITIN, TIBC, IRON, RETICCTPCT in the last 72 hours. Urine analysis:    Component Value Date/Time   COLORURINE YELLOW 03/01/2018 1857   APPEARANCEUR CLEAR 03/01/2018 1857   LABSPEC 1.014 03/01/2018 1857   PHURINE 6.0 03/01/2018 1857   GLUCOSEU NEGATIVE 03/01/2018 1857   HGBUR NEGATIVE 03/01/2018 1857   BILIRUBINUR NEGATIVE 03/01/2018 1857   KETONESUR NEGATIVE 03/01/2018 1857   PROTEINUR NEGATIVE  03/01/2018 1857   UROBILINOGEN 0.2 07/08/2015 0428   NITRITE NEGATIVE  03/01/2018 1857   LEUKOCYTESUR NEGATIVE 03/01/2018 1857    Radiological Exams on Admission: Dg Chest 2 View  Result Date: 04/04/2018 CLINICAL DATA:  Cough EXAM: CHEST - 2 VIEW COMPARISON:  CT chest 03/01/2018 FINDINGS: There is mild bilateral interstitial prominence. There is no focal consolidation. There is no pleural effusion or pneumothorax. The heart mediastinum are normal. There is evidence of prior CABG. There is a dual lead cardiac pacemaker. The osseous structures are unremarkable. IMPRESSION: Mild bilateral interstitial prominence concerning for interstitial edema. Electronically Signed   By: Kathreen Devoid   On: 04/04/2018 17:49    EKG: Independently reviewed.  Assessment/Plan Principal Problem:   Acute on chronic diastolic CHF (congestive heart failure) (HCC) Active Problems:   Atrial fibrillation (HCC)   PPM-St.Jude   COPD with acute exacerbation ([place])    1. Acute on chronic diastolic CHF - 1. CHF pathway 2. Lasix 80mg  IV BID 3. Daily BMP 4. Continue home potassium 5. Tele monitor 2. COPD exacerbation - likely brought on by CHF exacerbation. 1. COPD pathway 2. Prednisone 3. Doxycycline 4. Neb treatments 3. A.Fib - 1. Coumadin 2. Beta blocker  DVT prophylaxis: Coumadin Code Status: Full Family Communication: Family at bedside Disposition Plan: TBD - likely needs SNF Consults called: None Admission status: Place in obs   Morse Brueggemann, [place] [facility] Pager [phone]  If 7AM-7PM, please contact day team taking care of patient [email or web address] Password [facility]  04/04/2018, 8:18 PM

## 2018-04-04 NOTE — Progress Notes (Signed)
ANTICOAGULATION CONSULT NOTE - Initial Consult  Pharmacy Consult for warfarin Indication: atrial fibrillation  Allergies  Allergen Reactions  . Avelox [Moxifloxacin Hcl In Nacl] Other (See Comments)    Messed up lungs  . Indomethacin Other (See Comments)    Renal insufficiency  . Levaquin [Levofloxacin] Other (See Comments)    Messed up lungs  . Lipitor [Atorvastatin] Nausea Only, Palpitations and Other (See Comments)    Irregular heart beat also  . Moxifloxacin Other (See Comments)    Respiratory distress  . Pravachol [Pravastatin Sodium] Nausea Only, Palpitations and Other (See Comments)  . Aleve [Naproxen Sodium] Nausea And Vomiting and Other (See Comments)    GI Upset  . Clindamycin/Lincomycin Other (See Comments)    Severe acid reflux and stomach pain  . Norpace [Disopyramide Phosphate] Other (See Comments)    Urinary retention  . Disopyramide Other (See Comments)    Urinary retention  . Morphine Nausea Only    Irregular hear beat  . Pravastatin Nausea Only    Irregular heart beat  . Amlodipine Besylate Rash  . Azithromycin Rash  . Bactrim [Sulfamethoxazole-Trimethoprim] Rash  . Clinoril [Sulindac] Rash  . Codeine Other (See Comments)    unknown  . Digoxin Rash  . Lorazepam Other (See Comments)    unknown  . Morphine And Related Other (See Comments)    unknown  . Other Other (See Comments)    Tripilenomenon? Per paperwork  . Oxycodone-Acetaminophen Rash  . Percocet [Oxycodone-Acetaminophen] Rash  . Sulfamethoxazole-Trimethoprim Rash  . Sulfonamide Derivatives Rash  . Uloric [Febuxostat] Other (See Comments)    unknown    Patient Measurements: Height: 5\' 6"  (167.6 cm) Weight: 187 lb (84.8 kg) IBW/kg (Calculated) : 63.8  Vital Signs: Temp: 97.8 F (36.6 C) (05/27 1634) Temp Source: Oral (05/27 1634) BP: 115/54 (05/27 1915) Pulse Rate: 69 (05/27 1915)  Labs: Recent Labs    04/04/18 1655 04/04/18 1706  HGB  --  14.2  HCT  --  44.4  PLT  --  301   LABPROT 25.6*  --   INR 2.36  --   CREATININE  --  1.24  TROPONINI  --  0.03*    Estimated Creatinine Clearance: 38.8 mL/min (by C-G formula based on SCr of 1.24 mg/dL).   Medical History: Past Medical History:  Diagnosis Date  . Arrhythmia    afib  . Arthritis    "back and hands" (09/26/2015)  . Basal cell carcinoma   . CHF (congestive heart failure) ([place])   . Coronary artery disease   . CVI (common variable immunodeficiency) ([place])   . DJD (degenerative joint disease)   . ED (erectile dysfunction)   . GERD (gastroesophageal reflux disease)   . Gout   . Heart attack ([place]) 1986  . Hyperlipidemia   . Hypertension   . IFG (impaired fasting glucose)   . Neuropathy   . Non Hodgkin's lymphoma ([place]) dx'd 2006  . PAC (premature atrial contraction)   . PE (pulmonary embolism) 2007   "when I was taking chemo"  . Peptic ulcer disease   . Pneumonia ~ 2007 X 2  . Squamous carcinoma   . Venous stasis ulcers (HCC)    Assessment: CC/HPI: 84 yo f presenting w SOB - recent dc from SNF d/t loss of insurance coverage  PMH: CHF, ICM, CABG, COPD, afib on warfarin  Anticoag: warfarin pta for a fib. Admit INR 2.36 Last dose 5/26 PTA dose 2.5 mg daily  Renal: SCr 1.24  Heme/Onc: H&H 14.2/44.4,  Plt 301  Goal of Therapy:  INR 2-3 Monitor platelets by anticoagulation protocol: Yes   Plan:  Warfarin 2.5 mg x 1 Daily INR  Levester Fresh, PharmD, BCPS, BCCCP Clinical Pharmacist Clinical phone for 04/04/2018 from 1430 [phone]: [phone] If after 2300, please call main pharmacy at: [phone] 04/04/2018 8:41 PM

## 2018-04-05 DIAGNOSIS — I5033 Acute on chronic diastolic (congestive) heart failure: Secondary | ICD-10-CM | POA: Diagnosis not present

## 2018-04-05 LAB — BASIC METABOLIC PANEL
Anion gap: 8 (ref 5–15)
BUN: 17 mg/dL (ref 6–20)
CO2: 29 mmol/L (ref 22–32)
CREATININE: 1.26 mg/dL — AB (ref 0.61–1.24)
Calcium: 9.4 mg/dL (ref 8.9–10.3)
Chloride: 104 mmol/L (ref 101–111)
GFR calc non Af Amer: 48 mL/min — ABNORMAL LOW (ref >60)
GFR, EST AFRICAN AMERICAN: 55 mL/min — AB (ref >60)
Glucose, Bld: 158 mg/dL — ABNORMAL HIGH (ref 65–99)
Potassium: 4.2 mmol/L (ref 3.5–5.1)
SODIUM: 141 mmol/L (ref 135–145)

## 2018-04-05 LAB — PROTIME-INR
INR: 2.33
Prothrombin Time: 25.4 seconds — ABNORMAL HIGH (ref 11.4–15.2)

## 2018-04-05 MED ORDER — GUAIFENESIN ER 600 MG PO TB12
600.0000 mg | ORAL_TABLET | Freq: Two times a day (BID) | ORAL | Status: DC
  Administered 2018-04-05 – 2018-04-07 (×4): 600 mg via ORAL
  Filled 2018-04-05 (×5): qty 1

## 2018-04-05 MED ORDER — LEVALBUTEROL HCL 1.25 MG/0.5ML IN NEBU: 1.2500 mg | INHALATION_SOLUTION | Freq: Four times a day (QID) | RESPIRATORY_TRACT | Status: DC | PRN

## 2018-04-05 MED ORDER — LEVALBUTEROL HCL 1.25 MG/0.5ML IN NEBU
1.2500 mg | INHALATION_SOLUTION | Freq: Three times a day (TID) | RESPIRATORY_TRACT | Status: DC
  Administered 2018-04-06 – 2018-04-07 (×4): 1.25 mg via RESPIRATORY_TRACT
  Filled 2018-04-05 (×4): qty 0.5

## 2018-04-05 MED ORDER — WARFARIN SODIUM 2.5 MG PO TABS
2.5000 mg | ORAL_TABLET | Freq: Once | ORAL | Status: AC
  Administered 2018-04-05: 2.5 mg via ORAL
  Filled 2018-04-05 (×2): qty 1

## 2018-04-05 MED ORDER — IPRATROPIUM BROMIDE 0.02 % IN SOLN
0.5000 mg | Freq: Three times a day (TID) | RESPIRATORY_TRACT | Status: DC
  Administered 2018-04-06 – 2018-04-07 (×4): 0.5 mg via RESPIRATORY_TRACT
  Filled 2018-04-05 (×4): qty 2.5

## 2018-04-05 MED ORDER — DOXYCYCLINE HYCLATE 100 MG PO TABS
100.0000 mg | ORAL_TABLET | Freq: Two times a day (BID) | ORAL | Status: DC
Start: 2018-04-05 — End: 2018-04-07
  Administered 2018-04-05 – 2018-04-07 (×4): 100 mg via ORAL
  Filled 2018-04-05 (×4): qty 1

## 2018-04-05 MED ORDER — PREDNISONE 20 MG PO TABS
20.0000 mg | ORAL_TABLET | Freq: Every day | ORAL | Status: DC
  Administered 2018-04-06 – 2018-04-07 (×2): 20 mg via ORAL
  Filled 2018-04-05 (×2): qty 1

## 2018-04-05 MED ORDER — ENSURE ENLIVE PO LIQD
237.0000 mL | Freq: Two times a day (BID) | ORAL | Status: DC
  Administered 2018-04-06 – 2018-04-07 (×2): 237 mL via ORAL

## 2018-04-05 NOTE — Progress Notes (Signed)
Patient Demographics:    [name], is a 84 y.o. male, DOB - 08/06/1923, ONG:[id number]  Admit date - 04/04/2018   Admitting Physician No admitting provider for patient encounter.  Outpatient Primary MD for the patient is Lavone Orn, MD  LOS - 0   Chief Complaint  Patient presents with  . Pneumonia        Subjective:    [name] today has no fevers, no emesis,  No chest pain, breath is better, patient's son at bedside, questions answered,   Assessment  & Plan :    Principal Problem:   Acute on chronic diastolic CHF (congestive heart failure) ([place]) Active Problems:   Atrial fibrillation (HCC)   PPM-St.Jude   COPD with acute exacerbation [facility])  Brief summary 84 y.o. male with medical history significant of CHF, ICM with preserved EF 55-60% with apical hypokenesis on last echo, CABG, COPD, HTN, PE admitted on 04/04/2018 with acute on chronic dCHF exacerbation, recently discharged from nursing home, PT eval on 04/05/2018 recommend skilled nursing facility placement again  Plan:-  1)HFpEF--- patient was admitted with acute on chronic dCHF exacerbation, shortness of breath is better, lower extremity swelling improving, continue Lasix 80 mg twice daily, monitor daily weights, fluid input and output, and electrolytes while diuresing  2)PAFib--stable, status post prior stent Jude pacemaker placement, continue atenolol 50 mg daily for rate control, continue Coumadin for anticoagulation  3) acute COPD exacerbation--continue bronchodilators, decrease prednisone to 20 mg daily, continue mucolytics and doxycycline  4) chronic lower extremity venous insufficiency--- significantly improved with leg pumps, no significant open wounds at this time or superimposed cellulitis  5) generalized weakness/debility--physical therapy eval appreciated, awaiting social worker input with regards to possible  placement to skilled nursing facility for rehab  Code Status : Full  Disposition Plan  : ??? SNF  Consults  :  PT/SW   DVT Prophylaxis  : Coumadin Lab Results  Component Value Date   PLT 301 04/04/2018    Inpatient Medications  Scheduled Meds: . acetaminophen  500 mg Oral QHS  . allopurinol  300 mg Oral Daily  . ALPRAZolam  0.5 mg Oral QHS  . aspirin  81 mg Oral QODAY  . atenolol  50 mg Oral Daily  . cholecalciferol  4,000 Units Oral Daily  . doxycycline  100 mg Oral Q12H  . furosemide  80 mg Intravenous BID  . gabapentin  100 mg Oral TID  . guaiFENesin  600 mg Oral BID  . ipratropium  0.5 mg Nebulization QID  . mometasone-formoterol  2 puff Inhalation BID  . potassium chloride SA  40 mEq Oral BID  . [START ON 04/06/2018] predniSONE  20 mg Oral Q breakfast  . senna-docusate  2 tablet Oral QHS  . sodium chloride flush  3 mL Intravenous Q12H  . warfarin  2.5 mg Oral ONCE-1800  . Warfarin - Pharmacist Dosing Inpatient   Does not apply q1800   Continuous Infusions: . sodium chloride     PRN Meds:.sodium chloride, acetaminophen, albuterol, benzonatate, colchicine, nitroGLYCERIN, ondansetron (ZOFRAN) IV, polyethylene glycol, sodium chloride flush    Anti-infectives (From admission, onward)   Start     Dose/Rate Route Frequency Ordered Stop   04/04/18 2030  doxycycline (VIBRA-TABS) tablet 100 mg  100 mg Oral Every 12 hours 04/04/18 2016          Objective:   Vitals:   04/05/18 1200 04/05/18 1500 04/05/18 1508 04/05/18 1530  BP:   (!) 118/56   Pulse: 68 72 71 71  Resp: (!) 22 (!) 22 16 19   Temp:      TempSrc:      SpO2: 100% 96% 96% 95%  Weight:      Height:        Wt Readings from Last 3 Encounters:  04/04/18 84.8 kg (187 lb)  03/04/18 85 kg (187 lb 6.3 oz)  10/13/17 82.4 kg (181 lb 9.6 oz)     Intake/Output Summary (Last 24 hours) at 04/05/2018 1620 Last data filed at 04/05/2018 1345 Gross per 24 hour  Intake 243 ml  Output -  Net 243 ml      Physical Exam  Gen:- Awake Alert, able to speak in sentences, HEENT:- [place].AT, No sclera icterus Neck-Supple Neck,No JVD,.  Lungs-diminished in bases, few bibasilar rales, CV- S1, S2 normal, irregular, prior CABG scar, pacemaker in situ Abd-  +ve B.Sounds, Abd Soft, No tenderness,    Extremity/Skin:-Chronic venous stasis, venous insufficiency with discoloration,  +ve pulses Psych-affect is appropriate, oriented x3 Neuro-no new focal deficits, no tremors   Data Review:   Micro Results No results found for this or any previous visit (from the past 240 hour(s)).  Radiology Reports Dg Chest 2 View  Result Date: 04/04/2018 CLINICAL DATA:  Cough EXAM: CHEST - 2 VIEW COMPARISON:  CT chest 03/01/2018 FINDINGS: There is mild bilateral interstitial prominence. There is no focal consolidation. There is no pleural effusion or pneumothorax. The heart mediastinum are normal. There is evidence of prior CABG. There is a dual lead cardiac pacemaker. The osseous structures are unremarkable. IMPRESSION: Mild bilateral interstitial prominence concerning for interstitial edema. Electronically Signed   By: Kathreen Devoid   On: 04/04/2018 17:49     CBC Recent Labs  Lab 04/04/18 1706  WBC 7.2  HGB 14.2  HCT 44.4  PLT 301  MCV 102.1*  MCH 32.6  MCHC 32.0  RDW 15.0  LYMPHSABS 2.8  MONOABS 0.9  EOSABS 0.1  BASOSABS 0.1    Chemistries  Recent Labs  Lab 04/04/18 1706 04/05/18 0517  NA 141 141  K 3.5 4.2  CL 99* 104  CO2 29 29  GLUCOSE 115* 158*  BUN 17 17  CREATININE 1.24 1.26*  CALCIUM 9.2 9.4  AST 35  --   ALT 27  --   ALKPHOS 90  --   BILITOT 0.5  --    ------------------------------------------------------------------------------------------------------------------ No results for input(s): CHOL, HDL, LDLCALC, Alexander, CHOLHDL, LDLDIRECT in the last 72 hours.  No results found for:  HGBA1C ------------------------------------------------------------------------------------------------------------------ No results for input(s): TSH, T4TOTAL, T3FREE, THYROIDAB in the last 72 hours.  Invalid input(s): FREET3 ------------------------------------------------------------------------------------------------------------------ No results for input(s): VITAMINB12, FOLATE, FERRITIN, TIBC, IRON, RETICCTPCT in the last 72 hours.  Coagulation profile Recent Labs  Lab 04/04/18 1655 04/05/18 0517  INR 2.36 2.33    No results for input(s): DDIMER in the last 72 hours.  Cardiac Enzymes Recent Labs  Lab 04/04/18 1706  TROPONINI 0.03*   ------------------------------------------------------------------------------------------------------------------    Component Value Date/Time   BNP 156.3 (H) 04/04/2018 1706     Johathon Overturf M.D on 04/05/2018 at 4:20 PM  Between 7am to 7pm - Pager - [phone]  After 7pm go to [email or web address] - password [facility]  [facility] -  Office  [phone]  Voice Recognition Viviann Spare dictation system was used to create this note, attempts have been made to correct errors. Please contact the author with questions and/or clarifications.

## 2018-04-05 NOTE — ED Notes (Signed)
Report given to Ruth

## 2018-04-05 NOTE — Progress Notes (Signed)
ANTICOAGULATION CONSULT NOTE - Follow Consult  Pharmacy Consult for warfarin Indication: atrial fibrillation  Allergies  Allergen Reactions  . Avelox [Moxifloxacin Hcl In Nacl] Other (See Comments)    Messed up lungs  . Indomethacin Other (See Comments)    Renal insufficiency  . Levaquin [Levofloxacin] Other (See Comments)    Messed up lungs  . Lipitor [Atorvastatin] Nausea Only, Palpitations and Other (See Comments)    Irregular heart beat also  . Moxifloxacin Other (See Comments)    Respiratory distress  . Pravachol [Pravastatin Sodium] Nausea Only, Palpitations and Other (See Comments)  . Aleve [Naproxen Sodium] Nausea And Vomiting and Other (See Comments)    GI Upset  . Clindamycin/Lincomycin Other (See Comments)    Severe acid reflux and stomach pain  . Norpace [Disopyramide Phosphate] Other (See Comments)    Urinary retention  . Disopyramide Other (See Comments)    Urinary retention  . Morphine Nausea Only    Irregular hear beat  . Pravastatin Nausea Only    Irregular heart beat  . Amlodipine Besylate Rash  . Azithromycin Rash  . Bactrim [Sulfamethoxazole-Trimethoprim] Rash  . Clinoril [Sulindac] Rash  . Codeine Other (See Comments)    unknown  . Digoxin Rash  . Lorazepam Other (See Comments)    unknown  . Morphine And Related Other (See Comments)    unknown  . Other Other (See Comments)    Tripilenomenon? Per paperwork  . Oxycodone-Acetaminophen Rash  . Percocet [Oxycodone-Acetaminophen] Rash  . Sulfamethoxazole-Trimethoprim Rash  . Sulfonamide Derivatives Rash  . Uloric [Febuxostat] Other (See Comments)    unknown    Patient Measurements: Height: 5\' 6"  (167.6 cm) Weight: 187 lb (84.8 kg) IBW/kg (Calculated) : 63.8  Vital Signs: BP: 128/71 (05/28 0730) Pulse Rate: 60 (05/28 0730)  Labs: Recent Labs    04/04/18 1655 04/04/18 1706 04/05/18 0517  HGB  --  14.2  --   HCT  --  44.4  --   PLT  --  301  --   LABPROT 25.6*  --  25.4*  INR 2.36  --   2.33  CREATININE  --  1.24 1.26*  TROPONINI  --  0.03*  --     Estimated Creatinine Clearance: 38.2 mL/min (A) (by C-G formula based on SCr of 1.26 mg/dL (H)).   Medical History: Past Medical History:  Diagnosis Date  . Arrhythmia    afib  . Arthritis    "back and hands" (09/26/2015)  . Basal cell carcinoma   . CHF (congestive heart failure) ([place])   . Coronary artery disease   . CVI (common variable immunodeficiency) ([place])   . DJD (degenerative joint disease)   . ED (erectile dysfunction)   . GERD (gastroesophageal reflux disease)   . Gout   . Heart attack ([place]) 1986  . Hyperlipidemia   . Hypertension   . IFG (impaired fasting glucose)   . Neuropathy   . Non Hodgkin's lymphoma ([place]) dx'd 2006  . PAC (premature atrial contraction)   . PE (pulmonary embolism) 2007   "when I was taking chemo"  . Peptic ulcer disease   . Pneumonia ~ 2007 X 2  . Squamous carcinoma   . Venous stasis ulcers ([facility])    Assessment: CC/HPI: 84 yo f presenting w SOB - recent dc from SNF d/t loss of insurance coverage  PMH: CHF, ICM, CABG, COPD, afib on warfarin  Anticoag: warfarin pta for a fib. Admit INR 2.36 PTA dose 2.5 mg daily  INR today 2.33.  Significant drug interactions include doxycycline   Renal: SCr 1.24  Heme/Onc: H&H 14.2/44.4, Plt 301 (on 5/27)  Goal of Therapy:  INR 2-3 Monitor platelets by anticoagulation protocol: Yes   Plan:  Warfarin 2.5 mg x 1 Daily INR  Albertina Parr, PharmD., BCPS Clinical Pharmacist Clinical phone for 04/05/18 until 3:30pm: [id number] If after 3:30pm, please call main pharmacy at: [phone]

## 2018-04-05 NOTE — Evaluation (Addendum)
Occupational Therapy Evaluation Patient Details Name: [name] MRN: [id number] DOB: Mar 12, 1923 Today's Date: 04/05/2018    History of Present Illness Pt is a 84 y/o male admitted secondary to worsening SOB. Recently d/c'd from SNF as insurance auth ended. PMH includes a fib, pacemaker, COPD, CHF, CAD s/p CABG, HTN, PE.    Clinical Impression   This 84 y/o male presents with the above. Pt most recently discharged home from SNF where he ws receiving therapy services. After return home pt reports needing some assist for bathing ADLs and using RW for functional mobility. Pt currently requiring modA for sit<>stand at [place] and minA to take side steps along EOB using RW; requires mod-maxA for LB ADLs, minguard assist for UB ADL. No family present during session, though per RN report and PT eval, pt's family with concerns about pt being able to manage at home as he typically lives alone and family is not able to provide 24hr assist long term. Pt demonstrates great motivation to work with therapy and progress to PLOF. Feel pt will benefit from additional SNF level therapy services to maximize his overall safety and independence with ADLs and mobility prior to return home. Will continue to follow acutely to progress pt towards established OT goals.     Follow Up Recommendations  SNF;Supervision/Assistance - 24 hour    Equipment Recommendations  None recommended by OT;Other (comment)(to be further assessed in next venue )           Precautions / Restrictions Precautions Precautions: Fall Restrictions Weight Bearing Restrictions: No      Mobility Bed Mobility Overal bed mobility: Needs Assistance Bed Mobility: Supine to Sit;Sit to Supine     Supine to sit: Mod assist Sit to supine: Min assist   General bed mobility comments: Mod A for trunk elevation and assist with scooting hips to EOB. Increased time required. Min A for guiding trunk when returning to supine in bed    Transfers Overall transfer level: Needs assistance Equipment used: Rolling walker (2 wheeled) Transfers: Sit to/from Stand Sit to Stand: Mod assist;Min assist         General transfer comment: ModA initially from EOB, returned to sitting due to feet sliding, requiring manual blocking of feet during second sit<>stand; minA for sit<>stand during second attempt from slightly elevated bed surface with assist to rise and steady, increased time for transition of hands to RW     Balance Overall balance assessment: Needs assistance Sitting-balance support: No upper extremity supported;Feet supported Sitting balance-Leahy Scale: Fair     Standing balance support: Bilateral upper extremity supported;During functional activity Standing balance-Leahy Scale: Poor Standing balance comment: Reliant on BUE support                            ADL either performed or assessed with clinical judgement   ADL Overall ADL's : Needs assistance/impaired Eating/Feeding: Supervision/ safety;Sitting   Grooming: Wash/dry face;Set up;Sitting   Upper Body Bathing: Min guard;Sitting   Lower Body Bathing: Moderate assistance;Sit to/from stand   Upper Body Dressing : Min guard;Sitting   Lower Body Dressing: Sit to/from stand;Maximal assistance       Toileting- Clothing Manipulation and Hygiene: Sit to/from stand;Maximal assistance       Functional mobility during ADLs: Minimal assistance;Rolling walker General ADL Comments: pt completing bed mobility, requires modA for sit<>stand from EOB and x2 attempts as pts feet sliding out from under him upon intial attempt to stand,  therapist blocking feet for increased stability and pt reports he does better with his shoes (reports son is bringing them in); pt took side steps along EOB using RW with minA prior to return to sitting; very motivated to work with therapy                          Pertinent Vitals/Pain Pain Assessment: No/denies  pain     Hand Dominance Right   Extremity/Trunk Assessment Upper Extremity Assessment Upper Extremity Assessment: Generalized weakness   Lower Extremity Assessment Lower Extremity Assessment: Defer to PT evaluation   Cervical / Trunk Assessment Cervical / Trunk Assessment: Kyphotic   Communication Communication Communication: HOH   Cognition Arousal/Alertness: Awake/alert Behavior During Therapy: WFL for tasks assessed/performed Overall Cognitive Status: Within Functional Limits for tasks assessed                                                      Home Living Family/patient expects to be discharged to:: Private residence Living Arrangements: Alone Available Help at Discharge: Family;Available PRN/intermittently Type of Home: House Home Access: Ramped entrance     Home Layout: One level     Bathroom Shower/Tub: Teacher, early years/pre: Handicapped height     Home Equipment: Grab bars - toilet;Grab bars - tub/shower;Walker - 2 wheels;Walker - 4 wheels;Tub bench;Bedside commode;Shower seat   Additional Comments: Son reports family/friends have been staying with pt since d/c from SNF during the day, however, cannot continue long term.       Prior Functioning/Environment Level of Independence: Needs assistance  Gait / Transfers Assistance Needed: Using RW for ambulation  ADL's / Homemaking Assistance Needed: Required assist with bathing/dressing tasks at home.            OT Problem List: Decreased strength;Impaired balance (sitting and/or standing);Decreased activity tolerance      OT Treatment/Interventions: Self-care/ADL training;Therapeutic exercise;Balance training;Energy conservation;Therapeutic activities;DME and/or AE instruction;Patient/family education    OT Goals(Current goals can be found in the care plan section) Acute Rehab OT Goals Patient Stated Goal: to get better; return home OT Goal Formulation: With  patient Time For Goal Achievement: 04/19/18 Potential to Achieve Goals: Good  OT Frequency: Min 2X/week                             AM-PAC PT "6 Clicks" Daily Activity     Outcome Measure Help from another person eating meals?: A Little Help from another person taking care of personal grooming?: A Little Help from another person toileting, which includes using toliet, bedpan, or urinal?: A Lot Help from another person bathing (including washing, rinsing, drying)?: A Lot Help from another person to put on and taking off regular upper body clothing?: A Little Help from another person to put on and taking off regular lower body clothing?: A Lot 6 Click Score: 15   End of Session Equipment Utilized During Treatment: Gait belt;Rolling walker Nurse Communication: Mobility status  Activity Tolerance: Patient tolerated treatment well Patient left: in bed;with call bell/phone within reach;with bed alarm set  OT Visit Diagnosis: Muscle weakness (generalized) (M62.81)                Time: 1696-7893 OT Time Calculation (min): 24 min Charges:  OT General Charges $OT Visit: 1 Visit OT Evaluation $OT Eval Moderate Complexity: 1 Mod G-Codes:     Lou Cal, OT Pager [phone] 04/05/2018   Raymondo Band 04/05/2018, 4:26 PM

## 2018-04-05 NOTE — ED Notes (Signed)
Breakfast tray ordered for pt

## 2018-04-05 NOTE — ED Notes (Signed)
RN replaced Condom cath,.

## 2018-04-05 NOTE — ED Notes (Signed)
Pt ate breakfast 100%

## 2018-04-05 NOTE — ED Notes (Signed)
Requested hospital bed from [facility] 

## 2018-04-05 NOTE — ED Notes (Signed)
Heart healthy lunch tray ordered 

## 2018-04-05 NOTE — ED Notes (Signed)
Pt Son [name]) Phone Number : [phone] Call When Pt Gets Room

## 2018-04-05 NOTE — Evaluation (Signed)
Physical Therapy Evaluation Patient Details Name: [name] MRN: [id number] DOB: 1923-03-15 Today's Date: 04/05/2018   History of Present Illness  Pt is a 84 y/o male admitted secondary to worsening SOB. Recently d/c'd from SNF as insurance auth ended. PMH includes a fib, pacemaker, COPD, CHF, CAD s/p CABG, HTN, PE.   Clinical Impression  Pt admitted secondary to problem above with deficits below. Required min to mod A for basic mobility tasks this session with use of RW. Pt and pt's son reports concern with pt's current deficits and concerns about not being able to perform necessary tasks at home. Pt lives alone at baseline. Will continue to follow acutely to maximize functional mobility independence and safety.     Follow Up Recommendations SNF    Equipment Recommendations  None recommended by PT    Recommendations for Other Services OT consult     Precautions / Restrictions Precautions Precautions: Fall Restrictions Weight Bearing Restrictions: No      Mobility  Bed Mobility Overal bed mobility: Needs Assistance Bed Mobility: Supine to Sit;Sit to Supine     Supine to sit: Mod assist Sit to supine: Min assist   General bed mobility comments: Mod A for trunk elevation and assist with scooting hips to EOB. Increased time required. Min A for LE lift assist back to bed.   Transfers Overall transfer level: Needs assistance Equipment used: Rolling walker (2 wheeled) Transfers: Sit to/from Stand Sit to Stand: Min assist;Mod assist         General transfer comment: Min A from elevated surfaces and mod A from lower surfaces for lift assist and steadying. Increased time required. Required manual blocking of feet as they were sliding. Required assist for clean up in standing following BM as pt requiring BUE support.   Ambulation/Gait Ambulation/Gait assistance: Min assist Ambulation Distance (Feet): 15 Feet Assistive device: Rolling walker (2 wheeled) Gait  Pattern/deviations: Step-through pattern;Decreased stride length;Trunk flexed Gait velocity: Decreased Gait velocity interpretation: <1.31 ft/sec, indicative of household ambulator General Gait Details: Slow, unsteady gait. Required cues for upright posture and proximity to device. Min A for steadying assist.   Stairs            Wheelchair Mobility    Modified Rankin (Stroke Patients Only)       Balance Overall balance assessment: Needs assistance Sitting-balance support: No upper extremity supported;Feet supported Sitting balance-Leahy Scale: Fair     Standing balance support: Bilateral upper extremity supported;During functional activity Standing balance-Leahy Scale: Poor Standing balance comment: Reliant on BUE support                              Pertinent Vitals/Pain Pain Assessment: No/denies pain    Home Living Family/patient expects to be discharged to:: Private residence Living Arrangements: Alone Available Help at Discharge: Family;Available PRN/intermittently Type of Home: House Home Access: Ramped entrance     Home Layout: One level Home Equipment: Grab bars - toilet;Grab bars - tub/shower;Walker - 2 wheels;Walker - 4 wheels;Tub bench;Bedside commode;Shower seat Additional Comments: Son reports family/friends have been staying with pt since d/c from SNF during the day, however, cannot continue long term.     Prior Function Level of Independence: Needs assistance   Gait / Transfers Assistance Needed: Using RW for ambulation   ADL's / Homemaking Assistance Needed: Required assist with bathing/dressing tasks at home.         Hand Dominance   Dominant Hand: Right  Extremity/Trunk Assessment   Upper Extremity Assessment Upper Extremity Assessment: Defer to OT evaluation    Lower Extremity Assessment Lower Extremity Assessment: Generalized weakness    Cervical / Trunk Assessment Cervical / Trunk Assessment: Kyphotic   Communication   Communication: HOH  Cognition Arousal/Alertness: Awake/alert Behavior During Therapy: WFL for tasks assessed/performed Overall Cognitive Status: Within Functional Limits for tasks assessed                                        General Comments General comments (skin integrity, edema, etc.): Pt's son present during session. Presenting concerns that pt unable to perform mobility/ADL tasks at home.     Exercises     Assessment/Plan    PT Assessment Patient needs continued PT services  PT Problem List Decreased strength;Decreased balance;Decreased activity tolerance;Decreased mobility;Decreased knowledge of use of DME;Decreased knowledge of precautions       PT Treatment Interventions DME instruction;Gait training;Therapeutic activities;Functional mobility training;Therapeutic exercise;Balance training;Patient/family education    PT Goals (Current goals can be found in the Care Plan section)  Acute Rehab PT Goals Patient Stated Goal: to get better PT Goal Formulation: With patient/family Time For Goal Achievement: 04/19/18 Potential to Achieve Goals: Good    Frequency Min 3X/week   Barriers to discharge Decreased caregiver support      Co-evaluation               AM-PAC PT "6 Clicks" Daily Activity  Outcome Measure Difficulty turning over in bed (including adjusting bedclothes, sheets and blankets)?: A Lot Difficulty moving from lying on back to sitting on the side of the bed? : Unable Difficulty sitting down on and standing up from a chair with arms (e.g., wheelchair, bedside commode, etc,.)?: Unable Help needed moving to and from a bed to chair (including a wheelchair)?: A Little Help needed walking in hospital room?: A Little Help needed climbing 3-5 steps with a railing? : A Lot 6 Click Score: 12    End of Session Equipment Utilized During Treatment: Gait belt Activity Tolerance: Patient tolerated treatment well Patient left:  in bed;with call bell/phone within reach;with family/visitor present;with nursing/sitter in room Nurse Communication: Mobility status(Pt had BM ) PT Visit Diagnosis: Unsteadiness on feet (R26.81);Other abnormalities of gait and mobility (R26.89);Muscle weakness (generalized) (M62.81)    Time: 9371-6967 PT Time Calculation (min) (ACUTE ONLY): 44 min   Charges:   PT Evaluation $PT Eval Low Complexity: 1 Low PT Treatments $Therapeutic Activity: 23-37 mins   PT G Codes:        [name], PT, DPT  Acute Rehabilitation Services  Pager: [phone]   [name] 04/05/2018, 12:19 PM

## 2018-04-05 NOTE — ED Notes (Signed)
Patient son give recliner

## 2018-04-06 DIAGNOSIS — D839 Common variable immunodeficiency, unspecified: Secondary | ICD-10-CM | POA: Diagnosis present

## 2018-04-06 DIAGNOSIS — Z85828 Personal history of other malignant neoplasm of skin: Secondary | ICD-10-CM | POA: Diagnosis not present

## 2018-04-06 DIAGNOSIS — E785 Hyperlipidemia, unspecified: Secondary | ICD-10-CM | POA: Diagnosis present

## 2018-04-06 DIAGNOSIS — Z950 Presence of cardiac pacemaker: Secondary | ICD-10-CM | POA: Diagnosis not present

## 2018-04-06 DIAGNOSIS — R0602 Shortness of breath: Secondary | ICD-10-CM | POA: Diagnosis present

## 2018-04-06 DIAGNOSIS — J440 Chronic obstructive pulmonary disease with acute lower respiratory infection: Secondary | ICD-10-CM | POA: Diagnosis present

## 2018-04-06 DIAGNOSIS — I2510 Atherosclerotic heart disease of native coronary artery without angina pectoris: Secondary | ICD-10-CM | POA: Diagnosis present

## 2018-04-06 DIAGNOSIS — M109 Gout, unspecified: Secondary | ICD-10-CM | POA: Diagnosis present

## 2018-04-06 DIAGNOSIS — I110 Hypertensive heart disease with heart failure: Secondary | ICD-10-CM | POA: Diagnosis present

## 2018-04-06 DIAGNOSIS — Z7901 Long term (current) use of anticoagulants: Secondary | ICD-10-CM | POA: Diagnosis not present

## 2018-04-06 DIAGNOSIS — Z79899 Other long term (current) drug therapy: Secondary | ICD-10-CM | POA: Diagnosis not present

## 2018-04-06 DIAGNOSIS — Z8249 Family history of ischemic heart disease and other diseases of the circulatory system: Secondary | ICD-10-CM | POA: Diagnosis not present

## 2018-04-06 DIAGNOSIS — M19042 Primary osteoarthritis, left hand: Secondary | ICD-10-CM | POA: Diagnosis present

## 2018-04-06 DIAGNOSIS — M19041 Primary osteoarthritis, right hand: Secondary | ICD-10-CM | POA: Diagnosis present

## 2018-04-06 DIAGNOSIS — L97909 Non-pressure chronic ulcer of unspecified part of unspecified lower leg with unspecified severity: Secondary | ICD-10-CM | POA: Diagnosis present

## 2018-04-06 DIAGNOSIS — Z9841 Cataract extraction status, right eye: Secondary | ICD-10-CM | POA: Diagnosis not present

## 2018-04-06 DIAGNOSIS — I480 Paroxysmal atrial fibrillation: Secondary | ICD-10-CM | POA: Diagnosis present

## 2018-04-06 DIAGNOSIS — I872 Venous insufficiency (chronic) (peripheral): Secondary | ICD-10-CM | POA: Diagnosis present

## 2018-04-06 DIAGNOSIS — I5033 Acute on chronic diastolic (congestive) heart failure: Secondary | ICD-10-CM | POA: Diagnosis not present

## 2018-04-06 DIAGNOSIS — K219 Gastro-esophageal reflux disease without esophagitis: Secondary | ICD-10-CM | POA: Diagnosis present

## 2018-04-06 DIAGNOSIS — J441 Chronic obstructive pulmonary disease with (acute) exacerbation: Secondary | ICD-10-CM | POA: Diagnosis present

## 2018-04-06 DIAGNOSIS — Z87891 Personal history of nicotine dependence: Secondary | ICD-10-CM | POA: Diagnosis not present

## 2018-04-06 DIAGNOSIS — Z951 Presence of aortocoronary bypass graft: Secondary | ICD-10-CM | POA: Diagnosis not present

## 2018-04-06 DIAGNOSIS — Z961 Presence of intraocular lens: Secondary | ICD-10-CM | POA: Diagnosis present

## 2018-04-06 DIAGNOSIS — Z833 Family history of diabetes mellitus: Secondary | ICD-10-CM | POA: Diagnosis not present

## 2018-04-06 LAB — BASIC METABOLIC PANEL
ANION GAP: 12 (ref 5–15)
BUN: 27 mg/dL — ABNORMAL HIGH (ref 6–20)
CALCIUM: 9.5 mg/dL (ref 8.9–10.3)
CO2: 26 mmol/L (ref 22–32)
Chloride: 103 mmol/L (ref 101–111)
Creatinine, Ser: 1.17 mg/dL (ref 0.61–1.24)
GFR, EST NON AFRICAN AMERICAN: 52 mL/min — AB (ref >60)
Glucose, Bld: 98 mg/dL (ref 65–99)
Potassium: 4.3 mmol/L (ref 3.5–5.1)
SODIUM: 141 mmol/L (ref 135–145)

## 2018-04-06 LAB — CBC
HCT: 43 % (ref 39.0–52.0)
Hemoglobin: 13.6 g/dL (ref 13.0–17.0)
MCH: 33.3 pg (ref 26.0–34.0)
MCHC: 31.6 g/dL (ref 30.0–36.0)
MCV: 105.1 fL — ABNORMAL HIGH (ref 78.0–100.0)
PLATELETS: 252 10*3/uL (ref 150–400)
RBC: 4.09 MIL/uL — ABNORMAL LOW (ref 4.22–5.81)
RDW: 15.8 % — AB (ref 11.5–15.5)
WBC: 18.9 10*3/uL — AB (ref 4.0–10.5)

## 2018-04-06 LAB — PROTIME-INR
INR: 2.64
PROTHROMBIN TIME: 28 s — AB (ref 11.4–15.2)

## 2018-04-06 MED ORDER — WARFARIN SODIUM 2 MG PO TABS
2.0000 mg | ORAL_TABLET | Freq: Once | ORAL | Status: AC
Start: 2018-04-06 — End: 2018-04-06
  Administered 2018-04-06: 2 mg via ORAL
  Filled 2018-04-06: qty 1

## 2018-04-06 NOTE — Progress Notes (Signed)
Nutrition Brief Note  Patient identified on the Malnutrition Screening Tool (MST) Report and RD consulted for assessment for nutritional status/ needs per COPD/GOLD protocol.  Wt Readings from Last 15 Encounters:  04/06/18 175 lb 7.8 oz (79.6 kg)  03/04/18 187 lb 6.3 oz (85 kg)  10/13/17 181 lb 9.6 oz (82.4 kg)  08/18/17 176 lb 3.2 oz (79.9 kg)  04/07/17 192 lb (87.1 kg)  03/24/17 192 lb (87.1 kg)  09/23/16 196 lb (88.9 kg)  09/16/15 195 lb 9.6 oz (88.7 kg)  07/08/15 191 lb 12.8 oz (87 kg)  05/07/15 192 lb 14.4 oz (87.5 kg)  10/09/14 192 lb 6.4 oz (87.3 kg)  01/22/14 186 lb (84.4 kg)  01/10/14 188 lb (85.3 kg)  12/04/13 186 lb (84.4 kg)  11/01/13 188 lb (85.3 kg)   84 y.o. male with medical history significant of CHF, ICM with preserved EF 55-60% with apical hypokenesis on last echo, CABG, COPD, HTN, PE admitted on 04/04/2018 with acute on chronic dCHF exacerbation, recently discharged from nursing home, PT eval on 04/05/2018 recommend skilled nursing facility placement again  Pt admitted with CHF exacerbation.   Spoke with pt at bedside, who was very tangential at time of interview, but able to answer questions appropriately. He reports that he was recently discharged home from a SNF, where he made good progress. He shares that he generally has a good appetite- he consumed all of his breakfast other than 25% of the oatmeal today. PTA he was consuming 3 meals per day; pt eats a wide variety of foods ("I'm not choosy"). He has multiple family members and friends who cook for him- he reports they season foods lightly and prepare foods tenderly due to lack of teeth. Offered to mechanically downgrade diet, but pt politely declined.   Pt unsure of UBW. Noted pt has experienced an 8.8% wt loss over the past year, which is not significant for time frame. Suspect some wt loss related to fluid changes.   Nutrition-Focused physical exam completed. Findings are mild fat depletion, mild muscle  depletion, and mild edema. Depletions found mainly in upper and lower extremities, which is common of pts in the elderly population.   Discussed importance of good meal and supplement intake to promote healing. Pt with no further nutrition related concerns but expressed appreciation for visit.   Body mass index is 28.32 kg/m. Patient meets criteria for overweight based on current BMI.   Current diet order is Heart Healthy, patient is consuming approximately 75-100% of meals at this time. Labs and medications reviewed.   No nutrition interventions warranted at this time. If nutrition issues arise, please consult RD.   Aissa Lisowski A. Jimmye Norman, RD, LDN, CDE Pager: [phone] After hours Pager: [phone]

## 2018-04-06 NOTE — Progress Notes (Signed)
PROGRESS NOTE    [name]  ZOX:[id number] DOB: 10-05-1923 DOA: 04/04/2018 PCP: Lavone Orn, MD     Brief Narrative:  [name] is a 84 y.o.malewith medical history significant ofCHF, ICM with preserved EF 55-60% with apical hypokenesis on last echo, CABG, COPD, HTN, PE admitted on 04/04/2018 with acute on chronic dCHF exacerbation, recently discharged from nursing home.  New events last 24 hours / Subjective: No acute events overnight. States that he still feels wheezes and some swelling in his ankles.   Assessment & Plan:   Principal Problem:   Acute on chronic diastolic CHF (congestive heart failure) (HCC) Active Problems:   Atrial fibrillation (HCC)   PPM-St.Jude   COPD with acute exacerbation (HCC)   Acute on chronic diastolic (congestive) heart failure (HCC)  Acute on chronic diastolic CHF  -Currently on IV Lasix 80 mg twice daily, suspect can deescalate tomorrow if breath sounds improved and swelling down  -Daily weights, strict input and output  Paroxysmal A Fib -Status post prior St Jude pacemaker placement -Continue atenolol 50 mg daily for rate control -Continue Coumadin for anticoagulation -Normal sinus rhythm this morning  Acute COPD exacerbation -Continues to have coarse breath sounds bilaterally -Continue prednisone, doxycycline, nebs   Chronic lower extremity venous insufficiency -Stable   Generalized weakness/debility -Declined SNF  Leukocytosis -Likely in setting of prednisone use -Trend    DVT prophylaxis: Coumadin Code Status: Full Family Communication: No family at bedside Disposition Plan: Declined SNF and wants to go home with home health   Consultants:   None  Procedures:   None   Antimicrobials:  Anti-infectives (From admission, onward)   Start     Dose/Rate Route Frequency Ordered Stop   04/05/18 2000  doxycycline (VIBRA-TABS) tablet 100 mg     100 mg Oral Every 12 hours 04/05/18 1830     04/04/18 2030   doxycycline (VIBRA-TABS) tablet 100 mg  Status:  Discontinued     100 mg Oral Every 12 hours 04/04/18 2016 04/05/18 1830       Objective: Vitals:   04/06/18 0422 04/06/18 0800 04/06/18 0940 04/06/18 1144  BP: 127/67 125/68  115/63  Pulse: 69 67  73  Resp: (!) 24 (!) 22  18  Temp: 97.7 F (36.5 C) (!) 97.1 F (36.2 C)  97.6 F (36.4 C)  TempSrc: Oral Oral  Oral  SpO2: 95% 96% 100% 98%  Weight: 79.6 kg (175 lb 7.8 oz)     Height:        Intake/Output Summary (Last 24 hours) at 04/06/2018 1324 Last data filed at 04/06/2018 1304 Gross per 24 hour  Intake 480 ml  Output 1700 ml  Net -1220 ml   Filed Weights   04/04/18 1633 04/05/18 1740 04/06/18 0422  Weight: 84.8 kg (187 lb) 78.8 kg (173 lb 11.6 oz) 79.6 kg (175 lb 7.8 oz)    Examination:  General exam: Appears calm and comfortable  Respiratory system: Coarse breath sounds bilaterally. Respiratory effort normal. Cardiovascular system: S1 & S2 heard, RRR. No JVD, murmurs, rubs, gallops or clicks. Trace pedal edema. Gastrointestinal system: Abdomen is nondistended, soft and nontender. No organomegaly or masses felt. Normal bowel sounds heard. Central nervous system: Alert and oriented. No focal neurological deficits. Extremities: Symmetric 5 x 5 power. Skin: No rashes, lesions or ulcers. Chronic venous stasis changes BLE  Psychiatry: Judgement and insight appear normal. Mood & affect appropriate.   Data Reviewed: I have personally reviewed following labs and imaging studies  CBC:  Recent Labs  Lab 04/04/18 1706 04/06/18 0759  WBC 7.2 18.9*  NEUTROABS 3.1  --   HGB 14.2 13.6  HCT 44.4 43.0  MCV 102.1* 105.1*  PLT 301 326   Basic Metabolic Panel: Recent Labs  Lab 04/04/18 1706 04/05/18 0517 04/06/18 0759  NA 141 141 141  K 3.5 4.2 4.3  CL 99* 104 103  CO2 29 29 26   GLUCOSE 115* 158* 98  BUN 17 17 27*  CREATININE 1.24 1.26* 1.17  CALCIUM 9.2 9.4 9.5   GFR: Estimated Creatinine Clearance: 39.9 mL/min (by  C-G formula based on SCr of 1.17 mg/dL). Liver Function Tests: Recent Labs  Lab 04/04/18 1706  AST 35  ALT 27  ALKPHOS 90  BILITOT 0.5  PROT 6.9  ALBUMIN 2.6*   No results for input(s): LIPASE, AMYLASE in the last 168 hours. No results for input(s): AMMONIA in the last 168 hours. Coagulation Profile: Recent Labs  Lab 04/04/18 1655 04/05/18 0517 04/06/18 0759  INR 2.36 2.33 2.64   Cardiac Enzymes: Recent Labs  Lab 04/04/18 1706  TROPONINI 0.03*   BNP (last 3 results) No results for input(s): PROBNP in the last 8760 hours. HbA1C: No results for input(s): HGBA1C in the last 72 hours. CBG: No results for input(s): GLUCAP in the last 168 hours. Lipid Profile: No results for input(s): CHOL, HDL, LDLCALC, TRIG, CHOLHDL, LDLDIRECT in the last 72 hours. Thyroid Function Tests: No results for input(s): TSH, T4TOTAL, FREET4, T3FREE, THYROIDAB in the last 72 hours. Anemia Panel: No results for input(s): VITAMINB12, FOLATE, FERRITIN, TIBC, IRON, RETICCTPCT in the last 72 hours. Sepsis Labs: No results for input(s): PROCALCITON, LATICACIDVEN in the last 168 hours.  No results found for this or any previous visit (from the past 240 hour(s)).     Radiology Studies: Dg Chest 2 View  Result Date: 04/04/2018 CLINICAL DATA:  Cough EXAM: CHEST - 2 VIEW COMPARISON:  CT chest 03/01/2018 FINDINGS: There is mild bilateral interstitial prominence. There is no focal consolidation. There is no pleural effusion or pneumothorax. The heart mediastinum are normal. There is evidence of prior CABG. There is a dual lead cardiac pacemaker. The osseous structures are unremarkable. IMPRESSION: Mild bilateral interstitial prominence concerning for interstitial edema. Electronically Signed   By: Kathreen Devoid   On: 04/04/2018 17:49      Scheduled Meds: . acetaminophen  500 mg Oral QHS  . allopurinol  300 mg Oral Daily  . ALPRAZolam  0.5 mg Oral QHS  . aspirin  81 mg Oral QODAY  . atenolol  50 mg  Oral Daily  . cholecalciferol  4,000 Units Oral Daily  . doxycycline  100 mg Oral Q12H  . feeding supplement (ENSURE ENLIVE)  237 mL Oral BID BM  . furosemide  80 mg Intravenous BID  . gabapentin  100 mg Oral TID  . guaiFENesin  600 mg Oral BID  . ipratropium  0.5 mg Nebulization TID  . levalbuterol  1.25 mg Nebulization TID  . mometasone-formoterol  2 puff Inhalation BID  . potassium chloride SA  40 mEq Oral BID  . predniSONE  20 mg Oral Q breakfast  . senna-docusate  2 tablet Oral QHS  . sodium chloride flush  3 mL Intravenous Q12H  . warfarin  2 mg Oral ONCE-1800  . Warfarin - Pharmacist Dosing Inpatient   Does not apply q1800   Continuous Infusions: . sodium chloride       LOS: 0 days    Time spent: 40 minutes  Dessa Phi, DO [facility] [email or web address] Password [email or web address] 04/06/2018, 1:24 PM

## 2018-04-06 NOTE — Progress Notes (Signed)
ANTICOAGULATION CONSULT NOTE - Follow Consult  Pharmacy Consult for warfarin Indication: atrial fibrillation  Allergies  Allergen Reactions  . Avelox [Moxifloxacin Hcl In Nacl] Other (See Comments)    Messed up lungs  . Indomethacin Other (See Comments)    Renal insufficiency  . Levaquin [Levofloxacin] Other (See Comments)    Messed up lungs  . Lipitor [Atorvastatin] Nausea Only, Palpitations and Other (See Comments)    Irregular heart beat also  . Moxifloxacin Other (See Comments)    Respiratory distress  . Pravachol [Pravastatin Sodium] Nausea Only, Palpitations and Other (See Comments)  . Aleve [Naproxen Sodium] Nausea And Vomiting and Other (See Comments)    GI Upset  . Clindamycin/Lincomycin Other (See Comments)    Severe acid reflux and stomach pain  . Norpace [Disopyramide Phosphate] Other (See Comments)    Urinary retention  . Disopyramide Other (See Comments)    Urinary retention  . Morphine Nausea Only    Irregular hear beat  . Pravastatin Nausea Only    Irregular heart beat  . Amlodipine Besylate Rash  . Azithromycin Rash  . Bactrim [Sulfamethoxazole-Trimethoprim] Rash  . Clinoril [Sulindac] Rash  . Codeine Other (See Comments)    unknown  . Digoxin Rash  . Lorazepam Other (See Comments)    unknown  . Morphine And Related Other (See Comments)    unknown  . Other Other (See Comments)    Tripilenomenon? Per paperwork  . Oxycodone-Acetaminophen Rash  . Percocet [Oxycodone-Acetaminophen] Rash  . Sulfamethoxazole-Trimethoprim Rash  . Sulfonamide Derivatives Rash  . Uloric [Febuxostat] Other (See Comments)    unknown    Patient Measurements: Height: 5\' 6"  (167.6 cm) Weight: 175 lb 7.8 oz (79.6 kg) IBW/kg (Calculated) : 63.8  Vital Signs: Temp: 97.1 F (36.2 C) (05/29 0800) Temp Source: Oral (05/29 0800) BP: 125/68 (05/29 0800) Pulse Rate: 67 (05/29 0800)  Labs: Recent Labs    04/04/18 1655 04/04/18 1706 04/05/18 0517 04/06/18 0759  HGB  --   14.2  --  13.6  HCT  --  44.4  --  43.0  PLT  --  301  --  252  LABPROT 25.6*  --  25.4* 28.0*  INR 2.36  --  2.33 2.64  CREATININE  --  1.24 1.26* 1.17  TROPONINI  --  0.03*  --   --     Estimated Creatinine Clearance: 39.9 mL/min (by C-G formula based on SCr of 1.17 mg/dL).   Medical History: Past Medical History:  Diagnosis Date  . Arrhythmia    afib  . Arthritis    "back and hands" (09/26/2015)  . Basal cell carcinoma   . CHF (congestive heart failure) ([place])   . Coronary artery disease   . CVI (common variable immunodeficiency) ([place])   . DJD (degenerative joint disease)   . ED (erectile dysfunction)   . GERD (gastroesophageal reflux disease)   . Gout   . Heart attack ([place]) 1986  . Hyperlipidemia   . Hypertension   . IFG (impaired fasting glucose)   . Neuropathy   . Non Hodgkin's lymphoma ([place]) dx'd 2006  . PAC (premature atrial contraction)   . PE (pulmonary embolism) 2007   "when I was taking chemo"  . Peptic ulcer disease   . Pneumonia ~ 2007 X 2  . Squamous carcinoma   . Venous stasis ulcers (HCC)    Assessment: CC/HPI: 52 yof presenting w SOB - recent dc from SNF d/t loss of insurance coverage  PMH: CHF, ICM, CABG, COPD,  afib on warfarin  Anticoag: warfarin pta for a fib. Admit INR 2.36 PTA dose 2.5 mg daily  INR today 2.6. Significant drug interactions include doxycycline and prednisone (added today). CBC stable, no bleeding noted.   Goal of Therapy:  INR 2-3 Monitor platelets by anticoagulation protocol: Yes   Plan:  Warfarin 2 mg x 1 (reduce dose with addition of pred) Daily INR  Erin Hearing PharmD., BCPS Clinical Pharmacist 04/06/2018 10:46 AM

## 2018-04-06 NOTE — Clinical Social Work Note (Signed)
Clinical Social Work Assessment  Patient Details  Name: [name] MRN: [id number] Date of Birth: 07-31-1923  Date of referral:  04/06/18               Reason for consult:  Facility Placement, Discharge Planning                Permission sought to share information with:    Permission granted to share information::  No  Name::        Agency::     Relationship::     Contact Information:     Housing/Transportation Living arrangements for the past 2 months:  [place], [place] of Information:  Patient, Medical Team, Facility Patient Interpreter Needed:  None Criminal Activity/Legal Involvement Pertinent to Current Situation/Hospitalization:  No - Comment as needed Significant Relationships:  Adult Children, Friend, Other Family Members Lives with:  Self Do you feel safe going back to the place where you live?  Yes Need for family participation in patient care:  Yes (Comment)  Care giving concerns:  PT recommending SNF once medically stable for discharge.   Social Worker assessment / plan:  CSW met with patient. No supports at bedside. CSW introduced role and explained that PT recommendations would be discussed. Per [facility] admissions coordinator, patient was recently there getting rehab from 5/7-5/25 (18 days). Patient prefers to return home at discharge with [place]. Patient stated his niece comes in to cook and clean and a church friend that is a retired Therapist, sports comes in to assist patient as well. Patient has only two SNF days left in which he would be covered at 100%. If he changes his mind, after those two days he will owe a copay of around $160 per day. He does not have a secondary insurance to cover this copay. RNCM aware. No further concerns. CSW signing off as social work intervention is no longer needed.  Employment status:  Retired Nurse, adult PT Recommendations:  [place]  / Referral to community resources:  [place]  Patient/Family's Response to care:  Patient prefers to return home at discharge. Patient's family and friends are supportive and involved in patient's care. Patient appreciated social work intervention.  Patient/Family's Understanding of and Emotional Response to Diagnosis, Current Treatment, and Prognosis:  Patient has a good understanding of the reason for admission and his need for therapy after discharge. Patient appears happy with hospital care.  Emotional Assessment Appearance:  Appears stated age Attitude/Demeanor/Rapport:  Engaged, Gracious Affect (typically observed):  Appropriate, Calm, Pleasant Orientation:  Oriented to Self, Oriented to Place, Oriented to  Time, Oriented to Situation Alcohol / Substance use:  Never Used Psych involvement (Current and /or in the community):  No (Comment)  Discharge Needs  Concerns to be addressed:  Care Coordination Readmission within the last 30 days:  No Current discharge risk:  Dependent with Mobility, Lives alone Barriers to Discharge:  Continued Medical Work up   Candie Chroman, LCSW 04/06/2018, 10:21 AM

## 2018-04-06 NOTE — Care Management Obs Status (Signed)
[place] NOTIFICATION   Patient Details  Name: [name] MRN: [id number] Date of Birth: Aug 25, 1923   Medicare Observation Status Notification Given:  Yes    Erenest Rasher, RN 04/06/2018, 8:40 AM

## 2018-04-06 NOTE — Progress Notes (Signed)
Physical Therapy Treatment Patient Details Name: [name] MRN: [id number] DOB: 01-27-1923 Today's Date: 04/06/2018    History of Present Illness Pt is a 84 y/o male admitted secondary to worsening SOB. Recently d/c'd from SNF as insurance auth ended. PMH includes a fib, pacemaker, COPD, CHF, CAD s/p CABG, HTN, PE.    PT Comments    Pt progressing well with mobility. Supervision to perform seated ADLs; transfers and amb 120' with RW and intermittent min guard for balance. Per SW note, pt only with two SNF days left covered by insurance; regardless of this, pt plans to return home with intermittent assist from niece and friend from church. Will need to max out [facility] services. Although pt is moving better, remains at significant increased risk for falls and would benefit from more consistent supervision at home.   Follow Up Recommendations  SNF;Supervision for mobility/OOB     Equipment Recommendations  None recommended by PT    Recommendations for Other Services       Precautions / Restrictions Precautions Precautions: Fall Restrictions Weight Bearing Restrictions: No    Mobility  Bed Mobility               General bed mobility comments: Received sitting in recliner  Transfers Overall transfer level: Needs assistance Equipment used: Rolling walker (2 wheeled) Transfers: Sit to/from Stand Sit to Stand: Min guard         General transfer comment: Increased time and effort, but no physical assist required  Ambulation/Gait Ambulation/Gait assistance: Min guard Ambulation Distance (Feet): 120 Feet Assistive device: Rolling walker (2 wheeled) Gait Pattern/deviations: Step-through pattern;Decreased stride length;Trunk flexed Gait velocity: Decreased Gait velocity interpretation: <1.31 ft/sec, indicative of household ambulator General Gait Details: Slow amb with RW and intermittent min guard for balance. Intermittent cues to maintain proximity to [employer]    Modified Rankin (Stroke Patients Only)       Balance Overall balance assessment: Needs assistance Sitting-balance support: No upper extremity supported;Feet supported Sitting balance-Leahy Scale: Good Sitting balance - Comments: Indep to don shoes sitting at edge of recliner; increased time   Standing balance support: Bilateral upper extremity supported;During functional activity Standing balance-Leahy Scale: Poor Standing balance comment: Reliant on BUE support                             Cognition Arousal/Alertness: Awake/alert Behavior During Therapy: WFL for tasks assessed/performed Overall Cognitive Status: No family/caregiver present to determine baseline cognitive functioning Area of Impairment: Attention;Memory                   Current Attention Level: Selective Memory: Decreased short-term memory         General Comments: Likely baseline cognitive status      Exercises      General Comments        Pertinent Vitals/Pain Pain Assessment: No/denies pain    Home Living                      Prior Function            PT Goals (current goals can now be found in the care plan section) Acute Rehab PT Goals Patient Stated Goal: to get better; return home PT Goal Formulation: With patient Time For Goal Achievement: 04/19/18 Potential to Achieve Goals: Good Progress towards PT goals:  Progressing toward goals    Frequency    Min 3X/week      PT Plan Current plan remains appropriate    Co-evaluation              AM-PAC PT "6 Clicks" Daily Activity  Outcome Measure  Difficulty turning over in bed (including adjusting bedclothes, sheets and blankets)?: A Little Difficulty moving from lying on back to sitting on the side of the bed? : A Little Difficulty sitting down on and standing up from a chair with arms (e.g., wheelchair, bedside commode, etc,.)?: A Little Help needed  moving to and from a bed to chair (including a wheelchair)?: A Little Help needed walking in hospital room?: A Little Help needed climbing 3-5 steps with a railing? : A Lot 6 Click Score: 17    End of Session Equipment Utilized During Treatment: Gait belt Activity Tolerance: Patient tolerated treatment well Patient left: in chair;with call bell/phone within reach Nurse Communication: Mobility status PT Visit Diagnosis: Unsteadiness on feet (R26.81);Other abnormalities of gait and mobility (R26.89);Muscle weakness (generalized) (M62.81)     Time: 9357-0177 PT Time Calculation (min) (ACUTE ONLY): 27 min  Charges:  $Gait Training: 8-22 mins $Therapeutic Activity: 8-22 mins                    G Codes:      Mabeline Caras, PT, DPT Acute Rehab Services  Pager: [place] 04/06/2018, 5:41 PM

## 2018-04-06 NOTE — Care Management Note (Addendum)
Case Management Note  Patient Details  Name: [name] MRN: [id number] Date of Birth: Sep 12, 1923  Subjective/Objective:   CHF, COPD, atrial fib                 Action/Plan: Spoke to pt at bedside. Offered choice for [facility]. Had [facility] in the past for [facility]. States he lives at home alone. His niece lives a couple doors up from him. She cleans, cook and assist him at home each day. Has a friend that is a retired Therapist, sports that assist at home. Has neb machine, shower chair, RW and cane at home. Contacted [facility] with new referral. Contacted attending for [facility] RN, PT, aide and OT orders with F2F.   04/07/2018 1 pm NCM spoke to pt's son and states he is not able to stay with pt 24 hours. States he can hire someone but they pay $10 per hour and pt's income is limited. Son spoke to [place] and he will take pt home with [facility]. Contacted attending to add to [facility] SW to orders. Contacted [facility] to make aware of dc home today with HH.   Expected Discharge Date:                  Expected Discharge Plan:  [place]  In-House Referral:  Clinical Social Work  Discharge planning Services  CM Consult  Post Acute Care Choice:  Home Health Choice offered to:  Patient  DME Arranged:  N/A DME Agency:  NA  HH Arranged:  RN, Disease Management, PT, Nurse's Aide [place] Agency:  [place]  Status of Service:  Completed, signed off  If discussed at [place] of Stay Meetings, dates discussed:    Additional Comments:  Erenest Rasher, RN 04/06/2018, 10:30 AM

## 2018-04-07 LAB — CBC
HEMATOCRIT: 42 % (ref 39.0–52.0)
HEMOGLOBIN: 13.6 g/dL (ref 13.0–17.0)
MCH: 33.5 pg (ref 26.0–34.0)
MCHC: 32.4 g/dL (ref 30.0–36.0)
MCV: 103.4 fL — AB (ref 78.0–100.0)
Platelets: 288 10*3/uL (ref 150–400)
RBC: 4.06 MIL/uL — ABNORMAL LOW (ref 4.22–5.81)
RDW: 15.4 % (ref 11.5–15.5)
WBC: 14.4 10*3/uL — ABNORMAL HIGH (ref 4.0–10.5)

## 2018-04-07 LAB — PROTIME-INR
INR: 3.49
Prothrombin Time: 34.8 seconds — ABNORMAL HIGH (ref 11.4–15.2)

## 2018-04-07 LAB — BASIC METABOLIC PANEL
ANION GAP: 6 (ref 5–15)
BUN: 31 mg/dL — ABNORMAL HIGH (ref 6–20)
CHLORIDE: 105 mmol/L (ref 101–111)
CO2: 30 mmol/L (ref 22–32)
Calcium: 9.3 mg/dL (ref 8.9–10.3)
Creatinine, Ser: 1.25 mg/dL — ABNORMAL HIGH (ref 0.61–1.24)
GFR calc Af Amer: 56 mL/min — ABNORMAL LOW (ref >60)
GFR, EST NON AFRICAN AMERICAN: 48 mL/min — AB (ref >60)
GLUCOSE: 117 mg/dL — AB (ref 65–99)
POTASSIUM: 3.9 mmol/L (ref 3.5–5.1)
Sodium: 141 mmol/L (ref 135–145)

## 2018-04-07 MED ORDER — IPRATROPIUM BROMIDE 0.02 % IN SOLN: 0.5000 mg | Freq: Four times a day (QID) | RESPIRATORY_TRACT | Status: DC | PRN

## 2018-04-07 MED ORDER — FUROSEMIDE 40 MG PO TABS: ORAL_TABLET | ORAL | 0 refills | Status: DC

## 2018-04-07 MED ORDER — GUAIFENESIN ER 600 MG PO TB12: 600.0000 mg | ORAL_TABLET | Freq: Two times a day (BID) | ORAL | 0 refills | Status: DC

## 2018-04-07 MED ORDER — CHOLECALCIFEROL 100 MCG (4000 UT) PO TABS: 4000.0000 [IU] | ORAL_TABLET | Freq: Every day | ORAL | 5 refills | Status: AC

## 2018-04-07 MED ORDER — ATENOLOL 50 MG PO TABS: 50.0000 mg | ORAL_TABLET | Freq: Every day | ORAL | 5 refills | Status: AC

## 2018-04-07 MED ORDER — ENSURE ENLIVE PO LIQD: 237.0000 mL | Freq: Two times a day (BID) | ORAL | 5 refills | Status: AC

## 2018-04-07 MED ORDER — DOXYCYCLINE HYCLATE 100 MG PO TABS: 100.0000 mg | ORAL_TABLET | Freq: Two times a day (BID) | ORAL | 0 refills | Status: AC

## 2018-04-07 MED ORDER — PREDNISONE 20 MG PO TABS: 20.0000 mg | ORAL_TABLET | Freq: Every day | ORAL | 0 refills | Status: DC

## 2018-04-07 MED ORDER — ASPIRIN 81 MG PO TABS: 81.0000 mg | ORAL_TABLET | ORAL | 5 refills | Status: AC

## 2018-04-07 MED ORDER — SENNOSIDES-DOCUSATE SODIUM 8.6-50 MG PO TABS: 2.0000 | ORAL_TABLET | Freq: Every day | ORAL | 5 refills | Status: AC

## 2018-04-07 NOTE — Discharge Instructions (Signed)
Congestive heart failure with history of  lower extremity wounds and edema,,     1) very low-salt diet advised 2)Low extremity edema pumps as ordered  3) take medications exactly as prescribed 4)Repeat INR AND  BMP/kidney test  around 04/11/2018 5) follow-up with heart failure clinic as scheduled 6) daily weight advised, call if you gain more than 3 pounds in 1 day or more than 5 pounds in 1 week 7)home  health nurse to help with weekly lower extremity skin check, weight, medication compliance and dietary modifications PATIENT has Congestive heart failure......Marland Kitchen

## 2018-04-07 NOTE — Progress Notes (Signed)
ANTICOAGULATION CONSULT NOTE - Follow Consult  Pharmacy Consult for warfarin Indication: atrial fibrillation  Patient Measurements: Height: 5\' 6"  (167.6 cm) Weight: 171 lb (77.6 kg)(scale c) IBW/kg (Calculated) : 63.8  Vital Signs: Temp: 97.5 F (36.4 C) (05/30 0812) Temp Source: Oral (05/30 0812) BP: 131/67 (05/30 0812) Pulse Rate: 63 (05/30 0812)  Labs: Recent Labs    04/04/18 1706 04/05/18 0517 04/06/18 0759 04/07/18 0453  HGB 14.2  --  13.6 13.6  HCT 44.4  --  43.0 42.0  PLT 301  --  252 288  LABPROT  --  25.4* 28.0* 34.8*  INR  --  2.33 2.64 3.49  CREATININE 1.24 1.26* 1.17 1.25*  TROPONINI 0.03*  --   --   --     Estimated Creatinine Clearance: 37 mL/min (A) (by C-G formula based on SCr of 1.25 mg/dL (H)).   Medical History: Past Medical History:  Diagnosis Date  . Arrhythmia    afib  . Arthritis    "back and hands" (09/26/2015)  . Basal cell carcinoma   . CHF (congestive heart failure) ([place])   . Coronary artery disease   . CVI (common variable immunodeficiency) ([place])   . DJD (degenerative joint disease)   . ED (erectile dysfunction)   . GERD (gastroesophageal reflux disease)   . Gout   . Heart attack ([place]) 1986  . Hyperlipidemia   . Hypertension   . IFG (impaired fasting glucose)   . Neuropathy   . Non Hodgkin's lymphoma ([place]) dx'd 2006  . PAC (premature atrial contraction)   . PE (pulmonary embolism) 2007   "when I was taking chemo"  . Peptic ulcer disease   . Pneumonia ~ 2007 X 2  . Squamous carcinoma   . Venous stasis ulcers (HCC)    Assessment: CC/HPI: 55 yof presenting w SOB - recent dc from SNF d/t loss of insurance coverage  PMH: CHF, ICM, CABG, COPD, afib on warfarin  Anticoag: warfarin pta for a fib. Admit INR 2.36 PTA dose 2.5 mg daily  INR today has trending up to 3.49 after slight dose reduction last night. Significant drug interactions include doxycycline and prednisone (added 5/29). CBC has remained stable, no bleeding  noted.   Goal of Therapy:  INR 2-3 Monitor platelets by anticoagulation protocol: Yes   Plan:  Hold Warfarin tonight Daily INR for now  Erin Hearing PharmD., BCPS Clinical Pharmacist 04/07/2018 9:35 AM

## 2018-04-07 NOTE — Plan of Care (Signed)
  Problem: Activity: Goal: Capacity to carry out activities will improve Outcome: Completed/Met

## 2018-04-07 NOTE — Discharge Summary (Signed)
[name], is a 84 y.o. male  DOB 15-May-1923  MRN [id number].  Admission date:  04/04/2018  Admitting Physician  Etta Quill, DO  Discharge Date:  04/07/2018   Primary MD  Lavone Orn, MD  Recommendations for primary care physician for things to follow:  Congestive heart failure with history of  lower extremity wounds and edema,,     1) very low-salt diet advised 2)Low extremity edema pumps as ordered  3) take medications exactly as prescribed 4)Repeat INR AND  BMP/kidney test  around 04/11/2018 5) follow-up with heart failure clinic as scheduled 6) daily weight advised, call if you gain more than 3 pounds in 1 day or more than 5 pounds in 1 week 7)home  health nurse to help with weekly lower extremity skin check, weight, medication compliance and dietary modifications PATIENT has Congestive heart failure......Marland Kitchen    Admission Diagnosis  COPD exacerbation ([place]) [J44.1]   Discharge Diagnosis  COPD exacerbation ([place]) [J44.1]    Principal Problem:   Acute on chronic diastolic CHF (congestive heart failure) (HCC) Active Problems:   Atrial fibrillation (HCC)   PPM-St.Jude   Pressure injury of skin   COPD with acute exacerbation (HCC)   Acute on chronic diastolic (congestive) heart failure (HCC)      Past Medical History:  Diagnosis Date  . Arrhythmia    afib  . Arthritis    "back and hands" (09/26/2015)  . Basal cell carcinoma   . CHF (congestive heart failure) ([place])   . Coronary artery disease   . CVI (common variable immunodeficiency) ([place])   . DJD (degenerative joint disease)   . ED (erectile dysfunction)   . GERD (gastroesophageal reflux disease)   . Gout   . Heart attack ([place]) 1986  . Hyperlipidemia   . Hypertension   . IFG (impaired fasting glucose)   . Neuropathy   . Non Hodgkin's lymphoma ([place]) dx'd 2006  . PAC (premature atrial contraction)   . PE (pulmonary  embolism) 2007   "when I was taking chemo"  . Peptic ulcer disease   . Pneumonia ~ 2007 X 2  . Squamous carcinoma   . Venous stasis ulcers (HCC)     Past Surgical History:  Procedure Laterality Date  . BACK SURGERY    . CARDIAC CATHETERIZATION  1990  . CATARACT EXTRACTION W/ INTRAOCULAR LENS  IMPLANT, BILATERAL Bilateral 10/2006 ~ 11/2006  . CORONARY ANGIOPLASTY    . CORONARY ARTERY BYPASS GRAFT  1990   CABG X3  . ELBOW SURGERY Left    "related to gout"  . INSERT / REPLACE / REMOVE PACEMAKER  08/2010  . KNEE ARTHROSCOPY Left   . LUMBAR DISC SURGERY     "had pinched nerve; had to make room for it"  . NASAL SINUS SURGERY  1980   "related to bacteria infection in sinus"  . SQUAMOUS CELL CARCINOMA EXCISION Right 09/12/2015   cheek  . TONSILLECTOMY  1972       HPI  from the history and physical  done on the day of admission:   Chief Complaint: SOB  HPI: [name] is a 84 y.o. male with medical history significant of CHF, ICM with preserved EF 55-60% with apical hypokenesis on last echo, CABG, COPD.  Patient recently discharged from SNF a few days ago because insurance declined to continue to pay for SNF.  At home family used compression pumps for BLE peripheral edema.  LE edema improved, but breathing became worse.  Patient presents to ED with SOB, cough, congestion, wheezing.   ED Course: CXR shows mild pulm edema.  Patient given neb treatment and prednisone with some breathing improvement.    Hospital Course:    Brief summary 84 y.o.malewith medical history significant ofCHF, ICM with preserved EF 55-60% with apical hypokenesis on last echo, CABG, COPD, HTN, PE admitted on 04/04/2018 with acute on chronic dCHF exacerbation, recently discharged from nursing home, PT eval on 04/05/2018 recommend skilled nursing facility placement again. Patient continues to Refuse SNF, despite persuasion patient states "I am going home I am not going to no rehab.  I Make my own  decisions"  Plan:-  1)HFpEF--- patient was admitted with acute on chronic dCHF exacerbation, shortness of breath has improved significantly, lower extremity swelling much improved, okay to discharge on   Lasix  , repeat BMP with PCP as outpatient  2)PAFib--stable, status post prior stent Jude pacemaker placement, continue atenolol 50 mg daily for rate control, continue Coumadin for anticoagulation, repeat INR with PCP as advised  3) acute COPD exacerbation--much improved with bronchodilators, mucolytics, steroids and doxycycline    4) chronic lower extremity venous insufficiency--- significantly improved with leg pumps, no significant open wounds at this time or superimposed cellulitis  5) generalized weakness/debility--physical therapy eval appreciated,  social worker input with regards to possible placement to skilled nursing facility for rehab appreciated, Patient continues to Refuse SNF, despite persuasion patient states "I am going home I am not going to no rehab.  I Make my own decisions"  Code Status : Full  Disposition Plan  :  Patient continues to Refuse SNF, despite persuasion patient states "I am going home,  I am not going to no rehab.  I Make my own decisions"...  We will discharged home with home health services  Consults  :  PT/SW  Discharge Condition: stable  Follow UP  Follow-up Information    [facility].   Specialty:  Home Health Services Why:  Home Health RN, Physical Therapy, Occupational Therapy, aide -agency will call to arrange intitial visit Contact information: [address] [place] [address] [phone]           Diet and Activity recommendation:  As advised  Discharge Instructions     Discharge Instructions    ([place]) Call MD:  Anytime you have any of the following symptoms: 1) 3 pound weight gain in 24 hours or 5 pounds in 1 week 2) shortness of breath, with or without a dry  hacking cough 3) swelling in the hands, feet or stomach 4) if you have to sleep on extra pillows at night in order to breathe.   Complete by:  As directed    AMB referral to CHF clinic   Complete by:  As directed    Call MD for:  difficulty breathing, headache or visual disturbances   Complete by:  As directed    Call MD for:  persistant dizziness or light-headedness   Complete by:  As directed  Call MD for:  persistant nausea and vomiting   Complete by:  As directed    Call MD for:  redness, tenderness, or signs of infection (pain, swelling, redness, odor or green/yellow discharge around incision site)   Complete by:  As directed    Call MD for:  temperature >100.4   Complete by:  As directed    Diet - low sodium heart healthy   Complete by:  As directed    Discharge instructions   Complete by:  As directed    Congestive heart failure with history of  lower extremity wounds and edema,,     1) very low-salt diet advised 2)Low extremity edema pumps as ordered  3) take medications exactly as prescribed 4)Repeat INR AND  BMP/kidney test  around 04/11/2018 5) follow-up with heart failure clinic as scheduled 6) daily weight advised, call if you gain more than 3 pounds in 1 day or more than 5 pounds in 1 week 7)home  health nurse to help with weekly lower extremity skin check, weight, medication compliance and dietary modifications PATIENT has Congestive heart failure.......   Increase activity slowly   Complete by:  As directed         Discharge Medications     Allergies as of 04/07/2018      Reactions   Avelox [moxifloxacin Hcl In [place]] Other (See Comments)   Messed up lungs   Indomethacin Other (See Comments)   Renal insufficiency   Levaquin [levofloxacin] Other (See Comments)   Messed up lungs   Lipitor [atorvastatin] Nausea Only, Palpitations, Other (See Comments)   Irregular heart beat also   Moxifloxacin Other (See Comments)   Respiratory distress   Pravachol  [pravastatin Sodium] Nausea Only, Palpitations, Other (See Comments)   Aleve [naproxen Sodium] Nausea And Vomiting, Other (See Comments)   GI Upset   Clindamycin/lincomycin Other (See Comments)   Severe acid reflux and stomach pain   Norpace [disopyramide Phosphate] Other (See Comments)   Urinary retention   Disopyramide Other (See Comments)   Urinary retention   Morphine Nausea Only   Irregular hear beat   Pravastatin Nausea Only   Irregular heart beat   Amlodipine Besylate Rash   Azithromycin Rash   Bactrim [sulfamethoxazole-trimethoprim] Rash   Clinoril [sulindac] Rash   Codeine Other (See Comments)   unknown   Digoxin Rash   Lorazepam Other (See Comments)   unknown   Morphine And Related Other (See Comments)   unknown   Other Other (See Comments)   Tripilenomenon? Per paperwork   Oxycodone-acetaminophen Rash   Percocet [oxycodone-acetaminophen] Rash   Sulfamethoxazole-trimethoprim Rash   Sulfonamide Derivatives Rash   Uloric [febuxostat] Other (See Comments)   unknown      Medication List    STOP taking these medications   Cholecalciferol 2000 units Caps Replaced by:  Cholecalciferol 4000 units Tabs You also have another medication with the same name that you need to continue taking as instructed.   valACYclovir 500 MG tablet Commonly known as:  VALTREX     TAKE these medications   acetaminophen 500 MG tablet Commonly known as:  TYLENOL Take 500 mg by mouth at bedtime.   albuterol (2.5 MG/3ML) 0.083% nebulizer solution Commonly known as:  PROVENTIL Take 3 mLs (2.5 mg total) by nebulization every 4 (four) hours as needed for wheezing or shortness of breath. What changed:  Another medication with the same name was changed. Make sure you understand how and when to take each.   albuterol (2.5  MG/3ML) 0.083% nebulizer solution Commonly known as:  PROVENTIL Take 3 mLs (2.5 mg total) by nebulization every 2 (two) hours as needed for wheezing or shortness of  breath. What changed:  when to take this   allopurinol 300 MG tablet Commonly known as:  ZYLOPRIM Take 300 mg by mouth daily.   ALPRAZolam 0.5 MG tablet Commonly known as:  XANAX Take 1 tablet (0.5 mg total) by mouth at bedtime as needed for anxiety. For sleep What changed:    when to take this  additional instructions   aspirin 81 MG tablet Take 1 tablet (81 mg total) by mouth every other day. What changed:  Another medication with the same name was removed. Continue taking this medication, and follow the directions you see here.   atenolol 50 MG tablet Commonly known as:  TENORMIN Take 1 tablet (50 mg total) by mouth daily.   benzonatate 200 MG capsule Commonly known as:  TESSALON Take 1 capsule (200 mg total) by mouth 2 (two) times daily as needed for cough.   clotrimazole-betamethasone cream Commonly known as:  LOTRISONE Apply 1 application topically 2 (two) times daily as needed (rash).   colchicine 0.6 MG tablet Take 0.6 mg by mouth daily as needed (gout).   doxycycline 100 MG tablet Commonly known as:  VIBRA-TABS Take 1 tablet (100 mg total) by mouth 2 (two) times daily for 5 days.   erythromycin ophthalmic ointment See admin instructions. Apply to eyelids into both eyes at bedtime for 10 days (filled 03/14/18)   feeding supplement (ENSURE ENLIVE) Liqd Take 237 mLs by mouth 2 (two) times daily between meals.   furosemide 40 MG tablet Commonly known as:  LASIX 80 mg every AM and 40 mg every PM What changed:    how much to take  how to take this  when to take this  additional instructions   gabapentin 100 MG capsule Commonly known as:  NEURONTIN Take 100 mg by mouth at bedtime as needed (nerve pain).   guaiFENesin 600 MG 12 hr tablet Commonly known as:  MUCINEX Take 1 tablet (600 mg total) by mouth 2 (two) times daily.   ipratropium-albuterol 0.5-2.5 (3) MG/3ML Soln Commonly known as:  DUONEB Take 3 mLs by nebulization every 6 (six) hours as  needed.   miconazole 2 % powder Commonly known as:  MICOTIN Apply topically See admin instructions. Apply topically daily after bath - for rash   mometasone-formoterol 200-5 MCG/ACT Aero Commonly known as:  DULERA Inhale 2 puffs into the lungs 2 (two) times daily. What changed:    when to take this  reasons to take this   nitroGLYCERIN 0.4 MG SL tablet Commonly known as:  NITROSTAT Place 0.4 mg under the tongue every 5 (five) minutes as needed for chest pain.   nystatin 100000 UNIT/ML suspension Commonly known as:  MYCOSTATIN Take 5 mLs by mouth 2 (two) times daily. Swish and swallow   potassium chloride SA 20 MEQ tablet Commonly known as:  K-DUR,KLOR-CON Take 40 mEq by mouth 2 (two) times daily.   predniSONE 20 MG tablet Commonly known as:  DELTASONE Take 1 tablet (20 mg total) by mouth daily with breakfast. Start taking on:  04/08/2018   senna-docusate 8.6-50 MG tablet Commonly known as:  Senokot-S Take 2 tablets by mouth at bedtime. What changed:    when to take this  reasons to take this   Vitamin D 2000 units tablet Take 4,000 Units by mouth daily. What changed:  Another medication with the same name was added. Make sure you understand how and when to take each.  Another medication with the same name was removed. Continue taking this medication, and follow the directions you see here.   Cholecalciferol 4000 units Tabs Take 4,000 Units by mouth daily. Start taking on:  04/08/2018 What changed:  You were already taking a medication with the same name, and this prescription was added. Make sure you understand how and when to take each. Replaces:  Cholecalciferol 2000 units Caps   warfarin 2.5 MG tablet Commonly known as:  COUMADIN Take 1 tablet (2.5 mg total) by mouth daily at 6 PM. Or as directed       Major procedures and Radiology Reports - PLEASE review detailed and final reports for all details, in brief -    Dg Chest 2 View  Result Date:  04/04/2018 CLINICAL DATA:  Cough EXAM: CHEST - 2 VIEW COMPARISON:  CT chest 03/01/2018 FINDINGS: There is mild bilateral interstitial prominence. There is no focal consolidation. There is no pleural effusion or pneumothorax. The heart mediastinum are normal. There is evidence of prior CABG. There is a dual lead cardiac pacemaker. The osseous structures are unremarkable. IMPRESSION: Mild bilateral interstitial prominence concerning for interstitial edema. Electronically Signed   By: Kathreen Devoid   On: 04/04/2018 17:49    Micro Results   No results found for this or any previous visit (from the past 240 hour(s)).     Today   Subjective    [name] today has no new complaints, he just wants to go home          Patient has been seen and examined prior to discharge   Objective   Blood pressure 131/67, pulse 63, temperature (!) 97.5 F (36.4 C), temperature source Oral, resp. rate (!) 21, height 5\' 6"  (1.676 m), weight 77.6 kg (171 lb), SpO2 99 %.   Intake/Output Summary (Last 24 hours) at 04/07/2018 1133 Last data filed at 04/07/2018 1055 Gross per 24 hour  Intake 840 ml  Output 1851 ml  Net -1011 ml    Exam Gen:- Awake Alert, able to speak in complete sentences, HEENT:- [place].AT, No sclera icterus Neck-Supple Neck,No JVD,.  Lungs-improving movement without rales or wheezing, CV- S1, S2 normal, irregular, prior CABG scar, pacemaker in situ Abd-  +ve B.Sounds, Abd Soft, No tenderness,    Extremity/Skin:-Chronic venous stasis, venous insufficiency with discoloration,  +ve pulses, edema is much improved Psych-affect is appropriate, oriented x3 Neuro-no new focal deficits, no tremors    Data Review   CBC w Diff:  Lab Results  Component Value Date   WBC 14.4 (H) 04/07/2018   HGB 13.6 04/07/2018   HGB 14.9 05/18/2011   HGB 17.0 04/30/2008   HCT 42.0 04/07/2018   HCT 42.6 05/18/2011   HCT 48.0 04/30/2008   PLT 288 04/07/2018   PLT 188 05/18/2011   PLT 193 04/30/2008    LYMPHOPCT 39 04/04/2018   LYMPHOPCT 21.3 05/18/2011   LYMPHOPCT 43.3 04/30/2008   MONOPCT 13 04/04/2018   MONOPCT 11.1 05/18/2011   MONOPCT 9.8 04/30/2008   EOSPCT 1 04/04/2018   EOSPCT 1.4 05/18/2011   EOSPCT 3.9 04/30/2008   BASOPCT 1 04/04/2018   BASOPCT 0.2 05/18/2011   BASOPCT 1.1 04/30/2008    CMP:  Lab Results  Component Value Date   NA 141 04/07/2018   NA 143 03/29/2017   K 3.9 04/07/2018   CL 105 04/07/2018   CO2 30 04/07/2018  BUN 31 (H) 04/07/2018   BUN 25 (A) 03/29/2017   CREATININE 1.25 (H) 04/07/2018   CREATININE 1.09 10/19/2013   GLU 96 03/29/2017   PROT 6.9 04/04/2018   ALBUMIN 2.6 (L) 04/04/2018   BILITOT 0.5 04/04/2018   ALKPHOS 90 04/04/2018   AST 35 04/04/2018   ALT 27 04/04/2018  .   Total Discharge time is about 33 minutes  Roxan Hockey M.D on 04/07/2018 at 11:33 AM  [facility]   Office  [phone]  Voice Recognition Viviann Spare dictation system was used to create this note, attempts have been made to correct errors. Please contact the author with questions and/or clarifications.

## 2018-04-07 NOTE — Clinical Social Work Note (Signed)
CSW spoke with patient's son over the phone about SNF copays. He is agreeable to patient returning home with home health due to inability to afford. RNCM aware.  CSW signing off.  Dayton Scrape, [place]

## 2018-04-07 NOTE — Progress Notes (Signed)
Discharge instructions (including medications) discussed with and copy provided to patient/caregiver. Copy signed by patient's son at bedside. Paper prescriptions placed in folder with discharge instructions.

## 2018-04-08 DIAGNOSIS — H02102 Unspecified ectropion of right lower eyelid: Secondary | ICD-10-CM | POA: Diagnosis not present

## 2018-04-08 DIAGNOSIS — H0100A Unspecified blepharitis right eye, upper and lower eyelids: Secondary | ICD-10-CM | POA: Diagnosis not present

## 2018-04-08 DIAGNOSIS — H0100B Unspecified blepharitis left eye, upper and lower eyelids: Secondary | ICD-10-CM | POA: Diagnosis not present

## 2018-04-08 DIAGNOSIS — H02105 Unspecified ectropion of left lower eyelid: Secondary | ICD-10-CM | POA: Diagnosis not present

## 2018-04-11 DIAGNOSIS — I5033 Acute on chronic diastolic (congestive) heart failure: Secondary | ICD-10-CM | POA: Diagnosis not present

## 2018-04-12 DIAGNOSIS — I872 Venous insufficiency (chronic) (peripheral): Secondary | ICD-10-CM | POA: Diagnosis not present

## 2018-04-12 DIAGNOSIS — I5042 Chronic combined systolic (congestive) and diastolic (congestive) heart failure: Secondary | ICD-10-CM | POA: Diagnosis not present

## 2018-04-12 DIAGNOSIS — J209 Acute bronchitis, unspecified: Secondary | ICD-10-CM | POA: Diagnosis not present

## 2018-04-13 ENCOUNTER — Ambulatory Visit (INDEPENDENT_AMBULATORY_CARE_PROVIDER_SITE_OTHER): Payer: Medicare Other | Admitting: *Deleted

## 2018-04-13 DIAGNOSIS — I495 Sick sinus syndrome: Secondary | ICD-10-CM | POA: Diagnosis not present

## 2018-04-13 DIAGNOSIS — I482 Chronic atrial fibrillation, unspecified: Secondary | ICD-10-CM

## 2018-04-13 NOTE — Progress Notes (Signed)
Remote pacemaker transmission.   

## 2018-04-27 DIAGNOSIS — I5033 Acute on chronic diastolic (congestive) heart failure: Secondary | ICD-10-CM | POA: Diagnosis not present

## 2018-04-27 DIAGNOSIS — E876 Hypokalemia: Secondary | ICD-10-CM | POA: Diagnosis not present

## 2018-04-29 DIAGNOSIS — I5042 Chronic combined systolic (congestive) and diastolic (congestive) heart failure: Secondary | ICD-10-CM | POA: Diagnosis not present

## 2018-04-29 DIAGNOSIS — H02101 Unspecified ectropion of right upper eyelid: Secondary | ICD-10-CM | POA: Diagnosis not present

## 2018-04-29 DIAGNOSIS — H0100A Unspecified blepharitis right eye, upper and lower eyelids: Secondary | ICD-10-CM | POA: Diagnosis not present

## 2018-04-29 DIAGNOSIS — H02105 Unspecified ectropion of left lower eyelid: Secondary | ICD-10-CM | POA: Diagnosis not present

## 2018-04-29 DIAGNOSIS — H0100B Unspecified blepharitis left eye, upper and lower eyelids: Secondary | ICD-10-CM | POA: Diagnosis not present

## 2018-06-02 LAB — CUP PACEART REMOTE DEVICE CHECK
Battery Remaining Longevity: 62 mo
Battery Remaining Percentage: 40 %
Battery Voltage: 2.84 V
Implantable Lead Implant Date: 20111025
Implantable Lead Location: 753860
Lead Channel Impedance Value: 930 Ohm
Lead Channel Setting Pacing Pulse Width: 0.4 ms
MDC IDC LEAD IMPLANT DT: 20111025
MDC IDC LEAD LOCATION: 753859
MDC IDC MSMT LEADCHNL RV PACING THRESHOLD AMPLITUDE: 0.375 V
MDC IDC MSMT LEADCHNL RV PACING THRESHOLD PULSEWIDTH: 0.4 ms
MDC IDC MSMT LEADCHNL RV SENSING INTR AMPL: 12 mV
MDC IDC PG IMPLANT DT: 20111025
MDC IDC SESS DTM: 20190605081816
MDC IDC SET LEADCHNL RV PACING AMPLITUDE: 0.625
MDC IDC SET LEADCHNL RV SENSING SENSITIVITY: 2 mV
MDC IDC STAT BRADY RV PERCENT PACED: 80 %
Pulse Gen Serial Number: 7178130

## 2018-06-07 DIAGNOSIS — Z8701 Personal history of pneumonia (recurrent): Secondary | ICD-10-CM | POA: Diagnosis not present

## 2018-06-07 DIAGNOSIS — K219 Gastro-esophageal reflux disease without esophagitis: Secondary | ICD-10-CM | POA: Diagnosis not present

## 2018-06-07 DIAGNOSIS — E785 Hyperlipidemia, unspecified: Secondary | ICD-10-CM | POA: Diagnosis not present

## 2018-06-07 DIAGNOSIS — I4891 Unspecified atrial fibrillation: Secondary | ICD-10-CM | POA: Diagnosis not present

## 2018-06-07 DIAGNOSIS — Z950 Presence of cardiac pacemaker: Secondary | ICD-10-CM | POA: Diagnosis not present

## 2018-06-07 DIAGNOSIS — J441 Chronic obstructive pulmonary disease with (acute) exacerbation: Secondary | ICD-10-CM | POA: Diagnosis not present

## 2018-06-07 DIAGNOSIS — G629 Polyneuropathy, unspecified: Secondary | ICD-10-CM | POA: Diagnosis not present

## 2018-06-07 DIAGNOSIS — I255 Ischemic cardiomyopathy: Secondary | ICD-10-CM | POA: Diagnosis not present

## 2018-06-07 DIAGNOSIS — I110 Hypertensive heart disease with heart failure: Secondary | ICD-10-CM | POA: Diagnosis not present

## 2018-06-07 DIAGNOSIS — I2510 Atherosclerotic heart disease of native coronary artery without angina pectoris: Secondary | ICD-10-CM | POA: Diagnosis not present

## 2018-06-07 DIAGNOSIS — M1990 Unspecified osteoarthritis, unspecified site: Secondary | ICD-10-CM | POA: Diagnosis not present

## 2018-06-07 DIAGNOSIS — I5033 Acute on chronic diastolic (congestive) heart failure: Secondary | ICD-10-CM | POA: Diagnosis not present

## 2018-06-07 DIAGNOSIS — I872 Venous insufficiency (chronic) (peripheral): Secondary | ICD-10-CM | POA: Diagnosis not present

## 2018-06-08 DIAGNOSIS — E876 Hypokalemia: Secondary | ICD-10-CM | POA: Diagnosis not present

## 2018-06-08 DIAGNOSIS — Z85828 Personal history of other malignant neoplasm of skin: Secondary | ICD-10-CM | POA: Diagnosis not present

## 2018-06-08 DIAGNOSIS — L570 Actinic keratosis: Secondary | ICD-10-CM | POA: Diagnosis not present

## 2018-06-08 DIAGNOSIS — C44329 Squamous cell carcinoma of skin of other parts of face: Secondary | ICD-10-CM | POA: Diagnosis not present

## 2018-06-08 DIAGNOSIS — L821 Other seborrheic keratosis: Secondary | ICD-10-CM | POA: Diagnosis not present

## 2018-06-08 DIAGNOSIS — I509 Heart failure, unspecified: Secondary | ICD-10-CM | POA: Diagnosis not present

## 2018-06-08 DIAGNOSIS — D0439 Carcinoma in situ of skin of other parts of face: Secondary | ICD-10-CM | POA: Diagnosis not present

## 2018-06-20 ENCOUNTER — Telehealth: Payer: Self-pay

## 2018-06-20 NOTE — Telephone Encounter (Signed)
Phone call placed to patient's son to schedule visit with Palliative Care. Son requested 06/29/18 @ 2pm

## 2018-06-27 DIAGNOSIS — Z7901 Long term (current) use of anticoagulants: Secondary | ICD-10-CM | POA: Diagnosis not present

## 2018-06-29 ENCOUNTER — Other Ambulatory Visit: Payer: Medicare Other | Admitting: Hospice and Palliative Medicine

## 2018-06-29 DIAGNOSIS — Z515 Encounter for palliative care: Secondary | ICD-10-CM

## 2018-06-29 NOTE — Progress Notes (Signed)
Visit made at scheduled time. I introduced myself to the patient but he was not aware that there was a visit scheduled today. I spoke with patient's son by phone and he apologized but asked that we hold on the palliative care referral for now. He says that patient is being evaluated for a program with [employer] and he does not want multiple organizations coming to the home. Will ask that Inez Catalina, RN follow up in 2-3 weeks to check on the status.

## 2018-07-05 DIAGNOSIS — Z7901 Long term (current) use of anticoagulants: Secondary | ICD-10-CM | POA: Diagnosis not present

## 2018-07-13 ENCOUNTER — Ambulatory Visit (INDEPENDENT_AMBULATORY_CARE_PROVIDER_SITE_OTHER): Payer: Medicare Other | Admitting: *Deleted

## 2018-07-13 DIAGNOSIS — I482 Chronic atrial fibrillation, unspecified: Secondary | ICD-10-CM

## 2018-07-13 DIAGNOSIS — I495 Sick sinus syndrome: Secondary | ICD-10-CM

## 2018-07-13 NOTE — Progress Notes (Signed)
Remote pacemaker transmission.   

## 2018-07-18 DIAGNOSIS — I2510 Atherosclerotic heart disease of native coronary artery without angina pectoris: Secondary | ICD-10-CM | POA: Diagnosis not present

## 2018-07-18 DIAGNOSIS — I10 Essential (primary) hypertension: Secondary | ICD-10-CM | POA: Diagnosis not present

## 2018-07-18 DIAGNOSIS — I872 Venous insufficiency (chronic) (peripheral): Secondary | ICD-10-CM | POA: Diagnosis not present

## 2018-07-18 DIAGNOSIS — M109 Gout, unspecified: Secondary | ICD-10-CM | POA: Diagnosis not present

## 2018-08-02 DIAGNOSIS — M1990 Unspecified osteoarthritis, unspecified site: Secondary | ICD-10-CM | POA: Diagnosis not present

## 2018-08-02 DIAGNOSIS — L97211 Non-pressure chronic ulcer of right calf limited to breakdown of skin: Secondary | ICD-10-CM | POA: Diagnosis not present

## 2018-08-02 DIAGNOSIS — I110 Hypertensive heart disease with heart failure: Secondary | ICD-10-CM | POA: Diagnosis not present

## 2018-08-02 DIAGNOSIS — L97221 Non-pressure chronic ulcer of left calf limited to breakdown of skin: Secondary | ICD-10-CM | POA: Diagnosis not present

## 2018-08-02 DIAGNOSIS — E785 Hyperlipidemia, unspecified: Secondary | ICD-10-CM | POA: Diagnosis not present

## 2018-08-02 DIAGNOSIS — G629 Polyneuropathy, unspecified: Secondary | ICD-10-CM | POA: Diagnosis not present

## 2018-08-02 DIAGNOSIS — I2510 Atherosclerotic heart disease of native coronary artery without angina pectoris: Secondary | ICD-10-CM | POA: Diagnosis not present

## 2018-08-02 DIAGNOSIS — I4891 Unspecified atrial fibrillation: Secondary | ICD-10-CM | POA: Diagnosis not present

## 2018-08-02 DIAGNOSIS — I872 Venous insufficiency (chronic) (peripheral): Secondary | ICD-10-CM | POA: Diagnosis not present

## 2018-08-02 DIAGNOSIS — K219 Gastro-esophageal reflux disease without esophagitis: Secondary | ICD-10-CM | POA: Diagnosis not present

## 2018-08-02 DIAGNOSIS — I5033 Acute on chronic diastolic (congestive) heart failure: Secondary | ICD-10-CM | POA: Diagnosis not present

## 2018-08-02 DIAGNOSIS — I255 Ischemic cardiomyopathy: Secondary | ICD-10-CM | POA: Diagnosis not present

## 2018-08-02 DIAGNOSIS — J441 Chronic obstructive pulmonary disease with (acute) exacerbation: Secondary | ICD-10-CM | POA: Diagnosis not present

## 2018-08-09 ENCOUNTER — Ambulatory Visit: Payer: Medicare Other | Admitting: Internal Medicine

## 2018-08-09 ENCOUNTER — Encounter: Payer: Self-pay | Admitting: Internal Medicine

## 2018-08-09 VITALS — BP 126/80 | HR 70 | Ht 66.0 in | Wt 188.0 lb

## 2018-08-09 DIAGNOSIS — I482 Chronic atrial fibrillation, unspecified: Secondary | ICD-10-CM | POA: Diagnosis not present

## 2018-08-09 DIAGNOSIS — I495 Sick sinus syndrome: Secondary | ICD-10-CM | POA: Diagnosis not present

## 2018-08-09 DIAGNOSIS — Z950 Presence of cardiac pacemaker: Secondary | ICD-10-CM

## 2018-08-09 DIAGNOSIS — I739 Peripheral vascular disease, unspecified: Secondary | ICD-10-CM | POA: Insufficient documentation

## 2018-08-09 LAB — CUP PACEART INCLINIC DEVICE CHECK
Battery Remaining Longevity: 51 mo
Battery Voltage: 2.83 V
Brady Statistic RA Percent Paced: 0 %
Brady Statistic RV Percent Paced: 78 %
Implantable Lead Implant Date: 20111025
Lead Channel Impedance Value: 925 Ohm
Lead Channel Pacing Threshold Amplitude: 0.5 V
Lead Channel Pacing Threshold Amplitude: 0.75 V
Lead Channel Pacing Threshold Pulse Width: 0.4 ms
Lead Channel Pacing Threshold Pulse Width: 0.4 ms
Lead Channel Setting Pacing Amplitude: 0.75 V
Lead Channel Setting Pacing Pulse Width: 0.4 ms
Lead Channel Setting Sensing Sensitivity: 2 mV
MDC IDC LEAD IMPLANT DT: 20111025
MDC IDC LEAD LOCATION: 753859
MDC IDC LEAD LOCATION: 753860
MDC IDC MSMT LEADCHNL RA SENSING INTR AMPL: 1.1 mV
MDC IDC MSMT LEADCHNL RV PACING THRESHOLD AMPLITUDE: 0.5 V
MDC IDC MSMT LEADCHNL RV PACING THRESHOLD PULSEWIDTH: 0.4 ms
MDC IDC MSMT LEADCHNL RV SENSING INTR AMPL: 12 mV
MDC IDC PG IMPLANT DT: 20111025
MDC IDC PG SERIAL: 7178130
MDC IDC SESS DTM: 20191001103431

## 2018-08-09 LAB — CUP PACEART REMOTE DEVICE CHECK
Battery Remaining Percentage: 35 %
Implantable Lead Implant Date: 20111025
Implantable Lead Implant Date: 20111025
Implantable Lead Location: 753860
Lead Channel Impedance Value: 860 Ohm
Lead Channel Pacing Threshold Pulse Width: 0.4 ms
Lead Channel Sensing Intrinsic Amplitude: 12 mV
MDC IDC LEAD LOCATION: 753859
MDC IDC MSMT BATTERY REMAINING LONGEVITY: 53 mo
MDC IDC MSMT BATTERY VOLTAGE: 2.83 V
MDC IDC MSMT LEADCHNL RV PACING THRESHOLD AMPLITUDE: 0.5 V
MDC IDC PG IMPLANT DT: 20111025
MDC IDC SESS DTM: 20190904064532
MDC IDC SET LEADCHNL RV PACING AMPLITUDE: 0.75 V
MDC IDC SET LEADCHNL RV PACING PULSEWIDTH: 0.4 ms
MDC IDC SET LEADCHNL RV SENSING SENSITIVITY: 2 mV
MDC IDC STAT BRADY RV PERCENT PACED: 78 %
Pulse Gen Serial Number: 7178130

## 2018-08-09 NOTE — Patient Instructions (Signed)
Medication Instructions:  Your physician recommends that you continue on your current medications as directed. Please refer to the Current Medication list given to you today.  Labwork: None ordered.  Testing/Procedures: None ordered.  Follow-Up: Your physician wants you to follow-up in: one year with Dr. Lovena Le.   You will receive a reminder letter in the mail two months in advance. If you don't receive a letter, please call our office to schedule the follow-up appointment.  Remote monitoring is used to monitor your Pacemaker from home. This monitoring reduces the number of office visits required to check your device to one time per year. It allows [place] to keep an eye on the functioning of your device to ensure it is working properly. You are scheduled for a device check from home on 10/12/2018. You may send your transmission at any time that day. If you have a wireless device, the transmission will be sent automatically. After your physician reviews your transmission, you will receive a postcard with your next transmission date.  Any Other Special Instructions Will Be Listed Below (If Applicable).  If you need a refill on your cardiac medications before your next appointment, please call your pharmacy.

## 2018-08-09 NOTE — Progress Notes (Signed)
HPI [name] comes today for ongoing evaluation and management of his permanent pacemaker, and chronic atrial fibrillation.  The patient is a very pleasant 84 year old man with the above problems along with diastolic heart failure and venous insufficiency.  He has not had syncope and denies chest pain.  He has chronic dyspnea with exertion but his mobility is limited secondary to his advanced age, arthritis, and other comorbidities. He has developed sores on his legs. He is taking metolazone twice a week. Allergies  Allergen Reactions  . Avelox [Moxifloxacin Hcl In Nacl] Other (See Comments)    Messed up lungs, Respiratory Distress   . Indomethacin Other (See Comments)    Renal insufficiency  . Levaquin [Levofloxacin] Other (See Comments)    Messed up lungs  . Lipitor [Atorvastatin] Nausea Only, Palpitations and Other (See Comments)    Irregular heart beat also  . Morphine And Related Other (See Comments)    Irregular heart beart  . Pravachol [Pravastatin Sodium] Nausea Only, Palpitations and Other (See Comments)    Irregular heart beat  . Aleve [Naproxen Sodium] Nausea And Vomiting and Other (See Comments)    GI Upset  . Clindamycin/Lincomycin Other (See Comments)    Severe acid reflux and stomach pain  . Norpace [Disopyramide Phosphate] Other (See Comments)    Urinary retention  . Tripelennamine   . Amlodipine Besylate Rash  . Azithromycin Rash  . Bactrim [Sulfamethoxazole-Trimethoprim] Rash  . Clinoril [Sulindac] Rash  . Codeine Other (See Comments)    unknown  . Digoxin Rash  . Lorazepam Other (See Comments)    unknown  . Oxycodone-Acetaminophen Rash  . Sulfonamide Derivatives Rash  . Uloric [Febuxostat] Other (See Comments)    unknown     Current Outpatient Medications  Medication Sig Dispense Refill  . acetaminophen (TYLENOL) 500 MG tablet Take 500 mg by mouth at bedtime.     Marland Kitchen albuterol (PROVENTIL) (2.5 MG/3ML) 0.083% nebulizer solution Take 3 mLs (2.5  mg total) by nebulization every 2 (two) hours as needed for wheezing or shortness of breath. 75 mL 12  . allopurinol (ZYLOPRIM) 300 MG tablet Take 300 mg by mouth daily.     Marland Kitchen ALPRAZolam (XANAX) 0.5 MG tablet Take 1 tablet (0.5 mg total) by mouth at bedtime as needed for anxiety. For sleep 5 tablet 0  . aspirin 81 MG tablet Take 1 tablet (81 mg total) by mouth every other day. 30 tablet 5  . atenolol (TENORMIN) 50 MG tablet Take 1 tablet (50 mg total) by mouth daily. 30 tablet 5  . benzonatate (TESSALON) 200 MG capsule Take 1 capsule (200 mg total) by mouth 2 (two) times daily as needed for cough. 20 capsule 0  . cholecalciferol 4000 units TABS Take 4,000 Units by mouth daily. 30 tablet 5  . clotrimazole-betamethasone (LOTRISONE) cream Apply 1 application topically 2 (two) times daily as needed (rash).   0  . colchicine 0.6 MG tablet Take 0.6 mg by mouth daily as needed (gout).     . feeding supplement, ENSURE ENLIVE, (ENSURE ENLIVE) LIQD Take 237 mLs by mouth 2 (two) times daily between meals. 60 Bottle 5  . furosemide (LASIX) 40 MG tablet 80 mg every AM and 40 mg every PM 120 tablet 0  . gabapentin (NEURONTIN) 100 MG capsule Take 100 mg by mouth at bedtime as needed (nerve pain).     Marland Kitchen guaiFENesin (MUCINEX) 600 MG 12 hr tablet Take 1 tablet (600 mg total) by mouth 2 (two)  times daily. 20 tablet 0  . ipratropium-albuterol (DUONEB) 0.5-2.5 (3) MG/3ML SOLN Take 3 mLs by nebulization every 6 (six) hours as needed. 360 mL 5  . miconazole (MICOTIN) 2 % powder Apply topically See admin instructions. Apply topically daily after bath - for rash    . mometasone-formoterol (DULERA) 200-5 MCG/ACT AERO Inhale 2 puffs into the lungs 2 (two) times daily. 13 g 0  . nitroGLYCERIN (NITROSTAT) 0.4 MG SL tablet Place 0.4 mg under the tongue every 5 (five) minutes as needed for chest pain.    Marland Kitchen nystatin (MYCOSTATIN) 100000 UNIT/ML suspension Take 5 mLs by mouth 2 (two) times daily. Swish and swallow  0  . potassium  chloride SA (K-DUR,KLOR-CON) 20 MEQ tablet Take 40 mEq by mouth 2 (two) times daily.     . predniSONE (DELTASONE) 20 MG tablet Take 1 tablet (20 mg total) by mouth daily with breakfast. 3 tablet 0  . senna-docusate (SENOKOT-S) 8.6-50 MG tablet Take 2 tablets by mouth at bedtime. 60 tablet 5  . warfarin (COUMADIN) 2.5 MG tablet Take 1 tablet (2.5 mg total) by mouth daily at 6 PM. Or as directed     No current facility-administered medications for this visit.      Past Medical History:  Diagnosis Date  . Arrhythmia    afib  . Arthritis    "back and hands" (09/26/2015)  . Basal cell carcinoma   . CHF (congestive heart failure) ([place])   . Coronary artery disease   . CVI (common variable immunodeficiency) ([place])   . DJD (degenerative joint disease)   . ED (erectile dysfunction)   . GERD (gastroesophageal reflux disease)   . Gout   . Heart attack ([place]) 1986  . Hyperlipidemia   . Hypertension   . IFG (impaired fasting glucose)   . Neuropathy   . Non Hodgkin's lymphoma ([place]) dx'd 2006  . PAC (premature atrial contraction)   . PE (pulmonary embolism) 2007   "when I was taking chemo"  . Peptic ulcer disease   . Pneumonia ~ 2007 X 2  . Squamous carcinoma   . Venous stasis ulcers (H[facility])     ROS:   All systems reviewed and negative except as noted in the HPI.   Past Surgical History:  Procedure Laterality Date  . BACK SURGERY    . CARDIAC CATHETERIZATION  1990  . CATARACT EXTRACTION W/ INTRAOCULAR LENS  IMPLANT, BILATERAL Bilateral 10/2006 ~ 11/2006  . CORONARY ANGIOPLASTY    . CORONARY ARTERY BYPASS GRAFT  1990   CABG X3  . ELBOW SURGERY Left    "related to gout"  . INSERT / REPLACE / REMOVE PACEMAKER  08/2010  . KNEE ARTHROSCOPY Left   . LUMBAR DISC SURGERY     "had pinched nerve; had to make room for it"  . NASAL SINUS SURGERY  1980   "related to bacteria infection in sinus"  . SQUAMOUS CELL CARCINOMA EXCISION Right 09/12/2015   cheek  . TONSILLECTOMY  1972      Family History  Problem Relation Age of Onset  . Obesity Mother   . Lung disease Father   . Diabetes type II Sister   . Heart disease Brother   . Diabetes type II Daughter      Social History   Socioeconomic History  . Marital status: Widowed    Spouse name: Not on file  . Number of children: Not on file  . Years of education: Not on file  . Highest education level:  Not on file  Occupational History  . Not on file  Social Needs  . Financial resource strain: Not on file  . Food insecurity:    Worry: Not on file    Inability: Not on file  . Transportation needs:    Medical: Not on file    Non-medical: Not on file  Tobacco Use  . Smoking status: Former Smoker    Packs/day: 1.00    Years: 1.00    Pack years: 1.00    Types: Cigarettes  . Smokeless tobacco: Never Used  Substance and Sexual Activity  . Alcohol use: No  . Drug use: No  . Sexual activity: Never  Lifestyle  . Physical activity:    Days per week: Not on file    Minutes per session: Not on file  . Stress: Not on file  Relationships  . Social connections:    Talks on phone: Not on file    Gets together: Not on file    Attends religious service: Not on file    Active member of club or organization: Not on file    Attends meetings of clubs or organizations: Not on file    Relationship status: Not on file  . Intimate partner violence:    Fear of current or ex partner: Not on file    Emotionally abused: Not on file    Physically abused: Not on file    Forced sexual activity: Not on file  Other Topics Concern  . Not on file  Social History Narrative  . Not on file     BP 126/80   Pulse 70   Ht 5\' 6"  (1.676 m)   Wt 188 lb (85.3 kg)   BMI 30.34 kg/m   Physical Exam:  Elderly appearing man, NAD HEENT: Unremarkable Neck:  8 cm JVD, no thyromegally Lymphatics:  No adenopathy Back:  No CVA tenderness Lungs:  Clear with no wheezes HEART:  Regular rate rhythm, no murmurs, no rubs, no  clicks Abd:  soft, positive bowel sounds, no organomegally, no rebound, no guarding Ext:  2 plus pulses, no edema, no cyanosis, no clubbing Skin:  No rashes no nodules Neuro:  CN II through XII intact, motor grossly intact  EKG - atrial fib with a controlled VR  DEVICE  Normal device function.  See PaceArt for details.   Assess/Plan: 1. Diastolic heart failure - we discussed his diuretic therapy and peripheral edema. I agree with the metolazone. He will maintain a low sodium diet and keep his legs wrapped. 2. Atrial fib- his ventricular rate is well controlled. We will follow. 3. PPM - his St. Jude single chamber PPM is working normally.  4. Venous insufficiency - he has his legs wrapped and bandaged. He is not too swollen. He will keep his legs elevated.  [name].D.

## 2018-08-11 DIAGNOSIS — E876 Hypokalemia: Secondary | ICD-10-CM | POA: Diagnosis not present

## 2018-08-11 DIAGNOSIS — I110 Hypertensive heart disease with heart failure: Secondary | ICD-10-CM | POA: Diagnosis not present

## 2018-08-15 ENCOUNTER — Inpatient Hospital Stay (HOSPITAL_COMMUNITY)
Admission: EM | Admit: 2018-08-15 | Discharge: 2018-08-18 | DRG: 641 | Disposition: A | Discharge status: Skilled Nursing Facility | Payer: Medicare Other | Attending: Internal Medicine | Admitting: Internal Medicine

## 2018-08-15 ENCOUNTER — Emergency Department (HOSPITAL_COMMUNITY): Payer: Medicare Other

## 2018-08-15 ENCOUNTER — Other Ambulatory Visit: Payer: Self-pay

## 2018-08-15 DIAGNOSIS — Z882 Allergy status to sulfonamides status: Secondary | ICD-10-CM

## 2018-08-15 DIAGNOSIS — C8590 Non-Hodgkin lymphoma, unspecified, unspecified site: Secondary | ICD-10-CM | POA: Diagnosis present

## 2018-08-15 DIAGNOSIS — T502X5A Adverse effect of carbonic-anhydrase inhibitors, benzothiadiazides and other diuretics, initial encounter: Secondary | ICD-10-CM | POA: Diagnosis present

## 2018-08-15 DIAGNOSIS — R42 Dizziness and giddiness: Secondary | ICD-10-CM

## 2018-08-15 DIAGNOSIS — Z886 Allergy status to analgesic agent status: Secondary | ICD-10-CM | POA: Diagnosis not present

## 2018-08-15 DIAGNOSIS — I252 Old myocardial infarction: Secondary | ICD-10-CM | POA: Diagnosis not present

## 2018-08-15 DIAGNOSIS — Z950 Presence of cardiac pacemaker: Secondary | ICD-10-CM | POA: Diagnosis not present

## 2018-08-15 DIAGNOSIS — D72829 Elevated white blood cell count, unspecified: Secondary | ICD-10-CM | POA: Diagnosis present

## 2018-08-15 DIAGNOSIS — Z66 Do not resuscitate: Secondary | ICD-10-CM | POA: Diagnosis not present

## 2018-08-15 DIAGNOSIS — E876 Hypokalemia: Principal | ICD-10-CM | POA: Diagnosis present

## 2018-08-15 DIAGNOSIS — I495 Sick sinus syndrome: Secondary | ICD-10-CM | POA: Diagnosis present

## 2018-08-15 DIAGNOSIS — E785 Hyperlipidemia, unspecified: Secondary | ICD-10-CM | POA: Diagnosis present

## 2018-08-15 DIAGNOSIS — I5032 Chronic diastolic (congestive) heart failure: Secondary | ICD-10-CM | POA: Diagnosis not present

## 2018-08-15 DIAGNOSIS — I110 Hypertensive heart disease with heart failure: Secondary | ICD-10-CM | POA: Diagnosis present

## 2018-08-15 DIAGNOSIS — I872 Venous insufficiency (chronic) (peripheral): Secondary | ICD-10-CM | POA: Diagnosis not present

## 2018-08-15 DIAGNOSIS — Z951 Presence of aortocoronary bypass graft: Secondary | ICD-10-CM

## 2018-08-15 DIAGNOSIS — E860 Dehydration: Secondary | ICD-10-CM | POA: Diagnosis not present

## 2018-08-15 DIAGNOSIS — Z881 Allergy status to other antibiotic agents status: Secondary | ICD-10-CM

## 2018-08-15 DIAGNOSIS — I2510 Atherosclerotic heart disease of native coronary artery without angina pectoris: Secondary | ICD-10-CM | POA: Diagnosis present

## 2018-08-15 DIAGNOSIS — Z85828 Personal history of other malignant neoplasm of skin: Secondary | ICD-10-CM

## 2018-08-15 DIAGNOSIS — Z79899 Other long term (current) drug therapy: Secondary | ICD-10-CM

## 2018-08-15 DIAGNOSIS — Z7951 Long term (current) use of inhaled steroids: Secondary | ICD-10-CM

## 2018-08-15 DIAGNOSIS — Z7982 Long term (current) use of aspirin: Secondary | ICD-10-CM | POA: Diagnosis not present

## 2018-08-15 DIAGNOSIS — I4891 Unspecified atrial fibrillation: Secondary | ICD-10-CM | POA: Diagnosis present

## 2018-08-15 DIAGNOSIS — H538 Other visual disturbances: Secondary | ICD-10-CM | POA: Diagnosis not present

## 2018-08-15 DIAGNOSIS — Z885 Allergy status to narcotic agent status: Secondary | ICD-10-CM

## 2018-08-15 DIAGNOSIS — K219 Gastro-esophageal reflux disease without esophagitis: Secondary | ICD-10-CM | POA: Diagnosis present

## 2018-08-15 DIAGNOSIS — D72828 Other elevated white blood cell count: Secondary | ICD-10-CM | POA: Diagnosis not present

## 2018-08-15 DIAGNOSIS — Z888 Allergy status to other drugs, medicaments and biological substances status: Secondary | ICD-10-CM

## 2018-08-15 DIAGNOSIS — K5900 Constipation, unspecified: Secondary | ICD-10-CM | POA: Diagnosis present

## 2018-08-15 DIAGNOSIS — Z87891 Personal history of nicotine dependence: Secondary | ICD-10-CM

## 2018-08-15 DIAGNOSIS — M109 Gout, unspecified: Secondary | ICD-10-CM | POA: Diagnosis present

## 2018-08-15 DIAGNOSIS — G629 Polyneuropathy, unspecified: Secondary | ICD-10-CM | POA: Diagnosis present

## 2018-08-15 DIAGNOSIS — I951 Orthostatic hypotension: Secondary | ICD-10-CM | POA: Diagnosis not present

## 2018-08-15 DIAGNOSIS — L97909 Non-pressure chronic ulcer of unspecified part of unspecified lower leg with unspecified severity: Secondary | ICD-10-CM | POA: Diagnosis not present

## 2018-08-15 DIAGNOSIS — F419 Anxiety disorder, unspecified: Secondary | ICD-10-CM | POA: Diagnosis not present

## 2018-08-15 DIAGNOSIS — G4489 Other headache syndrome: Secondary | ICD-10-CM | POA: Diagnosis not present

## 2018-08-15 DIAGNOSIS — R0902 Hypoxemia: Secondary | ICD-10-CM | POA: Diagnosis not present

## 2018-08-15 DIAGNOSIS — I482 Chronic atrial fibrillation, unspecified: Secondary | ICD-10-CM | POA: Diagnosis present

## 2018-08-15 DIAGNOSIS — R51 Headache: Secondary | ICD-10-CM | POA: Diagnosis not present

## 2018-08-15 DIAGNOSIS — I83009 Varicose veins of unspecified lower extremity with ulcer of unspecified site: Secondary | ICD-10-CM | POA: Diagnosis present

## 2018-08-15 DIAGNOSIS — Z9861 Coronary angioplasty status: Secondary | ICD-10-CM

## 2018-08-15 DIAGNOSIS — Z7901 Long term (current) use of anticoagulants: Secondary | ICD-10-CM

## 2018-08-15 DIAGNOSIS — J449 Chronic obstructive pulmonary disease, unspecified: Secondary | ICD-10-CM | POA: Diagnosis present

## 2018-08-15 DIAGNOSIS — I4811 Longstanding persistent atrial fibrillation: Secondary | ICD-10-CM | POA: Diagnosis not present

## 2018-08-15 DIAGNOSIS — Z86711 Personal history of pulmonary embolism: Secondary | ICD-10-CM

## 2018-08-15 DIAGNOSIS — E559 Vitamin D deficiency, unspecified: Secondary | ICD-10-CM | POA: Diagnosis not present

## 2018-08-15 LAB — CBC
HEMATOCRIT: 44.8 % (ref 39.0–52.0)
HEMOGLOBIN: 14.8 g/dL (ref 13.0–17.0)
MCH: 33.3 pg (ref 26.0–34.0)
MCHC: 33 g/dL (ref 30.0–36.0)
MCV: 100.9 fL — ABNORMAL HIGH (ref 78.0–100.0)
Platelets: 234 10*3/uL (ref 150–400)
RBC: 4.44 MIL/uL (ref 4.22–5.81)
RDW: 14.3 % (ref 11.5–15.5)
WBC: 11.4 10*3/uL — ABNORMAL HIGH (ref 4.0–10.5)

## 2018-08-15 LAB — BASIC METABOLIC PANEL WITH GFR
Anion gap: 13 (ref 5–15)
BUN: 36 mg/dL — ABNORMAL HIGH (ref 8–23)
CO2: 30 mmol/L (ref 22–32)
Calcium: 9.8 mg/dL (ref 8.9–10.3)
Chloride: 90 mmol/L — ABNORMAL LOW (ref 98–111)
Creatinine, Ser: 1.39 mg/dL — ABNORMAL HIGH (ref 0.61–1.24)
GFR calc Af Amer: 49 mL/min — ABNORMAL LOW
GFR calc non Af Amer: 42 mL/min — ABNORMAL LOW
Glucose, Bld: 111 mg/dL — ABNORMAL HIGH (ref 70–99)
Potassium: 2.6 mmol/L — CL (ref 3.5–5.1)
Sodium: 133 mmol/L — ABNORMAL LOW (ref 135–145)

## 2018-08-15 LAB — URINALYSIS, ROUTINE W REFLEX MICROSCOPIC
Bacteria, UA: NONE SEEN
Bilirubin Urine: NEGATIVE
Glucose, UA: NEGATIVE mg/dL
Ketones, ur: NEGATIVE mg/dL
Leukocytes, UA: NEGATIVE
Nitrite: NEGATIVE
Protein, ur: NEGATIVE mg/dL
Specific Gravity, Urine: 1.006 (ref 1.005–1.030)
pH: 7 (ref 5.0–8.0)

## 2018-08-15 LAB — PROTIME-INR
INR: 2.54
Prothrombin Time: 27.2 seconds — ABNORMAL HIGH (ref 11.4–15.2)

## 2018-08-15 LAB — TROPONIN I: Troponin I: <0.03 ng/mL

## 2018-08-15 LAB — CBG MONITORING, ED: Glucose-Capillary: 106 mg/dL — ABNORMAL HIGH (ref 70–99)

## 2018-08-15 LAB — MAGNESIUM: Magnesium: 1.8 mg/dL (ref 1.7–2.4)

## 2018-08-15 MED ORDER — POTASSIUM CHLORIDE 10 MEQ/100ML IV SOLN
10.0000 meq | Freq: Once | INTRAVENOUS | Status: AC
  Administered 2018-08-15: 10 meq via INTRAVENOUS
  Filled 2018-08-15: qty 100

## 2018-08-15 NOTE — ED Provider Notes (Signed)
[facility] EMERGENCY DEPARTMENT Provider Note   CSN: [id number] Arrival date & time: 08/15/18  2058     History   Chief Complaint Chief Complaint  Patient presents with  . Weakness    HPI [name] is a 84 y.o. male.  HPI Patient presents with dizziness.  States that he is been feeling dizzy and having difficulty standing up.  States he went to the bathroom and was able to stand up after.  States he was able to pull himself up like he normally could but then began to fall backwards.  States he feels dizzy.  States he always feels lightheaded when he stands but this felt different.  No headache.  Does not really feel like the room is spinning. No chest pain. Chronic swelling in his legs. Moderate oral intake.  Past Medical History:  Diagnosis Date  . Arrhythmia    afib  . Arthritis    "back and hands" (09/26/2015)  . Basal cell carcinoma   . CHF (congestive heart failure) ([place])   . Coronary artery disease   . CVI (common variable immunodeficiency) ([place])   . DJD (degenerative joint disease)   . ED (erectile dysfunction)   . GERD (gastroesophageal reflux disease)   . Gout   . Heart attack ([place]) 1986  . Hyperlipidemia   . Hypertension   . IFG (impaired fasting glucose)   . Neuropathy   . Non Hodgkin's lymphoma ([place]) dx'd 2006  . PAC (premature atrial contraction)   . PE (pulmonary embolism) 2007   "when I was taking chemo"  . Peptic ulcer disease   . Pneumonia ~ 2007 X 2  . Squamous carcinoma   . Venous stasis ulcers [facility])     Patient Active Problem List   Diagnosis Date Noted  . PVD (peripheral vascular disease) ([place]) 08/09/2018  . Acute on chronic diastolic (congestive) heart failure ([place]) 04/06/2018  . COPD with acute exacerbation ([place]) 04/04/2018  . Acute on chronic diastolic CHF (congestive heart failure) ([place]) 02/28/2018  . Lymph edema 10/07/2017  . Candidiasis 09/02/2017  . Acute bronchospasm   . CHF (congestive heart failure)  ([place])   . Acute respiratory failure ([place]) 08/17/2017  . Dyspnea 08/15/2017  . Pressure injury of skin 03/22/2017  . Weakness 03/21/2017  . Venous ulcer of left leg ([place]) 01/21/2017  . Fever in adult 09/26/2015  . Gout 09/26/2015  . Leukocytosis 09/26/2015  . Venous stasis dermatitis of both lower extremities 07/17/2015  . Bilateral lower leg cellulitis 07/09/2015  . Sepsis due to other organism, without resultant organ failure 07/07/2015  . Chronic combined systolic and diastolic CHF (congestive heart failure) ([place]) 07/07/2015  . Varicose veins of left leg with both ulcer and inflammation ([place]) 02/05/2014  . Venous hypertension of lower extremity 02/05/2014  . Stasis dermatitis of both legs 02/05/2014  . Onychomycosis 12/04/2013  . Hx of CABG 04/25/2013  . Coronary artery disease 11/29/2011  . Venous stasis ulcer ([place]) 11/29/2011  . Neuropathy 11/29/2011  . Generalized weakness 11/29/2011  . Atrial fibrillation ([place]) 12/02/2010  . PPM-St.Jude 12/02/2010    Past Surgical History:  Procedure Laterality Date  . BACK SURGERY    . CARDIAC CATHETERIZATION  1990  . CATARACT EXTRACTION W/ INTRAOCULAR LENS  IMPLANT, BILATERAL Bilateral 10/2006 ~ 11/2006  . CORONARY ANGIOPLASTY    . CORONARY ARTERY BYPASS GRAFT  1990   CABG X3  . ELBOW SURGERY Left    "related to gout"  . INSERT / REPLACE /  REMOVE PACEMAKER  08/2010  . KNEE ARTHROSCOPY Left   . LUMBAR DISC SURGERY     "had pinched nerve; had to make room for it"  . NASAL SINUS SURGERY  1980   "related to bacteria infection in sinus"  . SQUAMOUS CELL CARCINOMA EXCISION Right 09/12/2015   cheek  . TONSILLECTOMY  1972        Home Medications    Prior to Admission medications   Medication Sig Start Date End Date Taking? Authorizing Provider  acetaminophen (TYLENOL) 500 MG tablet Take 500 mg by mouth at bedtime.     [provider]  albuterol (PROVENTIL) (2.5 MG/3ML) 0.083% nebulizer solution Take 3 mLs (2.5 mg total)  by nebulization every 2 (two) hours as needed for wheezing or shortness of breath. 03/04/18   Hosie Poisson, MD  allopurinol (ZYLOPRIM) 300 MG tablet Take 300 mg by mouth daily.     [provider]  ALPRAZolam Duanne Moron) 0.5 MG tablet Take 1 tablet (0.5 mg total) by mouth at bedtime as needed for anxiety. For sleep 03/23/17   Geradine Girt, DO  aspirin 81 MG tablet Take 1 tablet (81 mg total) by mouth every other day. 04/07/18   Roxan Hockey, MD  atenolol (TENORMIN) 50 MG tablet Take 1 tablet (50 mg total) by mouth daily. 04/07/18   Roxan Hockey, MD  benzonatate (TESSALON) 200 MG capsule Take 1 capsule (200 mg total) by mouth 2 (two) times daily as needed for cough. 03/04/18   Hosie Poisson, MD  cholecalciferol 4000 units TABS Take 4,000 Units by mouth daily. 04/08/18   Roxan Hockey, MD  clotrimazole-betamethasone (LOTRISONE) cream Apply 1 application topically 2 (two) times daily as needed (rash).  08/11/17   [provider]  colchicine 0.6 MG tablet Take 0.6 mg by mouth daily as needed (gout).     [provider]  feeding supplement, ENSURE ENLIVE, (ENSURE ENLIVE) LIQD Take 237 mLs by mouth 2 (two) times daily between meals. 04/07/18   Roxan Hockey, MD  furosemide (LASIX) 40 MG tablet 80 mg every AM and 40 mg every PM 04/07/18   Emokpae, Courage, MD  gabapentin (NEURONTIN) 100 MG capsule Take 100 mg by mouth at bedtime as needed (nerve pain).     [provider]  guaiFENesin (MUCINEX) 600 MG 12 hr tablet Take 1 tablet (600 mg total) by mouth 2 (two) times daily. 04/07/18   Emokpae, Courage, MD  ipratropium-albuterol (DUONEB) 0.5-2.5 (3) MG/3ML SOLN Take 3 mLs by nebulization every 6 (six) hours as needed. 03/04/18   Hosie Poisson, MD  miconazole (MICOTIN) 2 % powder Apply topically See admin instructions. Apply topically daily after bath - for rash    [provider]  mometasone-formoterol (DULERA) 200-5 MCG/ACT AERO Inhale 2 puffs into the lungs 2  (two) times daily. 03/04/18   Hosie Poisson, MD  nitroGLYCERIN (NITROSTAT) 0.4 MG SL tablet Place 0.4 mg under the tongue every 5 (five) minutes as needed for chest pain.    [provider]  nystatin (MYCOSTATIN) 100000 UNIT/ML suspension Take 5 mLs by mouth 2 (two) times daily. Swish and swallow 03/08/18   [provider]  potassium chloride SA (K-DUR,KLOR-CON) 20 MEQ tablet Take 40 mEq by mouth 2 (two) times daily.     [provider]  predniSONE (DELTASONE) 20 MG tablet Take 1 tablet (20 mg total) by mouth daily with breakfast. 04/08/18   Emokpae, Courage, MD  senna-docusate (SENOKOT-S) 8.6-50 MG tablet Take 2 tablets by mouth at bedtime.  04/07/18   Roxan Hockey, MD  warfarin (COUMADIN) 2.5 MG tablet Take 1 tablet (2.5 mg total) by mouth daily at 6 PM. Or as directed 03/05/18   Hosie Poisson, MD    Family History Family History  Problem Relation Age of Onset  . Obesity Mother   . Lung disease Father   . Diabetes type II Sister   . Heart disease Brother   . Diabetes type II Daughter     Social History Social History   Tobacco Use  . Smoking status: Former Smoker    Packs/day: 1.00    Years: 1.00    Pack years: 1.00    Types: Cigarettes  . Smokeless tobacco: Never Used  Substance Use Topics  . Alcohol use: No  . Drug use: No     Allergies   Avelox [moxifloxacin hcl in nacl]; Indomethacin; Levaquin [levofloxacin]; Lipitor [atorvastatin]; Morphine and related; Pravachol [pravastatin sodium]; Aleve [naproxen sodium]; Clindamycin/lincomycin; Norpace [disopyramide phosphate]; Tripelennamine; Amlodipine besylate; Azithromycin; Bactrim [sulfamethoxazole-trimethoprim]; Clinoril [sulindac]; Codeine; Digoxin; Lorazepam; Oxycodone-acetaminophen; Sulfonamide derivatives; and Uloric [febuxostat]   Review of Systems Review of Systems  Constitutional: Negative for appetite change.  HENT: Negative for congestion.   Respiratory: Negative for shortness of breath.     Cardiovascular: Positive for leg swelling. Negative for chest pain.  Gastrointestinal: Negative for abdominal distention.  Genitourinary: Negative for difficulty urinating.  Musculoskeletal: Negative for back pain.  Neurological: Positive for dizziness. Negative for syncope.  Hematological: Negative for adenopathy.  Psychiatric/Behavioral: Negative for behavioral problems.     Physical Exam Updated Vital Signs BP 130/68   Pulse 70   Temp 98.4 F (36.9 C) (Oral)   Resp 20   Ht 5\' 6"  (1.676 m)   Wt 79.8 kg   SpO2 97%   BMI 28.41 kg/m   Physical Exam  Constitutional: He appears well-developed.  HENT:  Head: Atraumatic.  Eyes: EOM are normal.  Neck: Neck supple.  Cardiovascular: Normal rate.  Pulmonary/Chest: Effort normal.  Abdominal: There is no tenderness.  Musculoskeletal: He exhibits edema.  Wraps on both his lower extremities.  Neurological: He is alert.  Finger-nose intact bilaterally.  Skin: Skin is warm. Capillary refill takes less than 2 seconds.     ED Treatments / Results  Labs (all labs ordered are listed, but only abnormal results are displayed) Labs Reviewed  BASIC METABOLIC PANEL - Abnormal; Notable for the following components:      Result Value   Sodium 133 (*)    Potassium 2.6 (*)    Chloride 90 (*)    Glucose, Bld 111 (*)    BUN 36 (*)    Creatinine, Ser 1.39 (*)    GFR calc non Af Amer 42 (*)    GFR calc Af Amer 49 (*)    All other components within normal limits  CBC - Abnormal; Notable for the following components:   WBC 11.4 (*)    MCV 100.9 (*)    All other components within normal limits  URINALYSIS, ROUTINE W REFLEX MICROSCOPIC - Abnormal; Notable for the following components:   Color, Urine STRAW (*)    Hgb urine dipstick SMALL (*)    All other components within normal limits  PROTIME-INR - Abnormal; Notable for the following components:   Prothrombin Time 27.2 (*)    All other components within normal limits  CBG MONITORING,  ED - Abnormal; Notable for the following components:   Glucose-Capillary 106 (*)    All other components within normal limits  TROPONIN  I  MAGNESIUM    EKG None  Radiology Ct Head Wo Contrast  Result Date: 08/15/2018 CLINICAL DATA:  Sudden onset generalized weakness at noon today. Blurry vision when standing. Nausea and headache. EXAM: CT HEAD WITHOUT CONTRAST TECHNIQUE: Contiguous axial images were obtained from the base of the skull through the vertex without intravenous contrast. COMPARISON:  03/21/2017 FINDINGS: Brain: Diffuse cerebral atrophy. Ventricular dilatation consistent with central atrophy. Low-attenuation changes throughout the deep white matter consistent with small vessel ischemic changes and lacunar infarcts. No mass effect or midline shift. No abnormal extra-axial fluid collections. Gray-white matter junctions are distinct. Basal cisterns are not effaced. No acute intracranial hemorrhage. Vascular: Moderate intracranial arterial calcifications are present. Skull: Calvarium appears intact. Sinuses/Orbits: Old fracture deformity of the left medial orbital wall. No significant opacification of the paranasal sinuses or mastoid air cells. Other: None. IMPRESSION: No acute intracranial abnormalities. Chronic atrophy and small vessel ischemic changes. Electronically Signed   By: Lucienne Capers M.D.   On: 08/15/2018 22:42    Procedures Procedures (including critical care time)  Medications Ordered in ED Medications - No data to display   Initial Impression / Assessment and Plan / ED Course  I have reviewed the triage vital signs and the nursing notes.  Pertinent labs & imaging results that were available during my care of the patient were reviewed by me and considered in my medical decision making (see chart for details).     Patient presents with dizziness.  Found to be hypokalemic.  Reportedly not orthostatic for EMS but did have a mild blood pressure drop with standing.   Head CT done due to the dizziness.  Somewhat difficult to delineate if it is vertiginous.  Does have reported history of vertigo.  Pacemaker so patient cannot is easily get an MRI.  Head CT is reassuring however.  Hypokalemia could cause him to feel bad also.  Will admit to hospitalist.  Final Clinical Impressions(s) / ED Diagnoses   Final diagnoses:  Hypokalemia    ED Discharge Orders    None       Davonna Belling, MD 08/15/18 2314

## 2018-08-15 NOTE — ED Triage Notes (Signed)
Pt to ED via EMS after sudden onset of generalized weakness around noon today. Pt d/c from nursing home in July and has had gradual decline in health, but today had acute onset of being unable to walk. Poor output today with PO intake remaining the same. Negative orthostatics. Blurry vision when standing. Recent increased Lasix dosage and added alopurinal. Nausea today with no vomiting and slight headache. Pt has sores on legs that are wrapped. Sts has home health come and dress those, but the nurse does not come until tomorrow.

## 2018-08-15 NOTE — ED Notes (Signed)
Lab to add-on trop and pt-inr

## 2018-08-16 ENCOUNTER — Encounter (HOSPITAL_COMMUNITY): Payer: Self-pay

## 2018-08-16 DIAGNOSIS — Z66 Do not resuscitate: Secondary | ICD-10-CM | POA: Diagnosis present

## 2018-08-16 DIAGNOSIS — Z881 Allergy status to other antibiotic agents status: Secondary | ICD-10-CM | POA: Diagnosis not present

## 2018-08-16 DIAGNOSIS — E876 Hypokalemia: Secondary | ICD-10-CM | POA: Diagnosis not present

## 2018-08-16 DIAGNOSIS — I2510 Atherosclerotic heart disease of native coronary artery without angina pectoris: Secondary | ICD-10-CM | POA: Diagnosis present

## 2018-08-16 DIAGNOSIS — K219 Gastro-esophageal reflux disease without esophagitis: Secondary | ICD-10-CM | POA: Diagnosis present

## 2018-08-16 DIAGNOSIS — I951 Orthostatic hypotension: Secondary | ICD-10-CM | POA: Diagnosis present

## 2018-08-16 DIAGNOSIS — Z950 Presence of cardiac pacemaker: Secondary | ICD-10-CM | POA: Diagnosis not present

## 2018-08-16 DIAGNOSIS — Z85828 Personal history of other malignant neoplasm of skin: Secondary | ICD-10-CM | POA: Diagnosis not present

## 2018-08-16 DIAGNOSIS — R42 Dizziness and giddiness: Secondary | ICD-10-CM

## 2018-08-16 DIAGNOSIS — I5032 Chronic diastolic (congestive) heart failure: Secondary | ICD-10-CM | POA: Diagnosis not present

## 2018-08-16 DIAGNOSIS — I10 Essential (primary) hypertension: Secondary | ICD-10-CM | POA: Insufficient documentation

## 2018-08-16 DIAGNOSIS — Z888 Allergy status to other drugs, medicaments and biological substances status: Secondary | ICD-10-CM | POA: Diagnosis not present

## 2018-08-16 DIAGNOSIS — D72829 Elevated white blood cell count, unspecified: Secondary | ICD-10-CM | POA: Diagnosis present

## 2018-08-16 DIAGNOSIS — I110 Hypertensive heart disease with heart failure: Secondary | ICD-10-CM | POA: Diagnosis present

## 2018-08-16 DIAGNOSIS — I872 Venous insufficiency (chronic) (peripheral): Secondary | ICD-10-CM | POA: Diagnosis present

## 2018-08-16 DIAGNOSIS — C8590 Non-Hodgkin lymphoma, unspecified, unspecified site: Secondary | ICD-10-CM | POA: Diagnosis present

## 2018-08-16 DIAGNOSIS — Z882 Allergy status to sulfonamides status: Secondary | ICD-10-CM | POA: Diagnosis not present

## 2018-08-16 DIAGNOSIS — Z886 Allergy status to analgesic agent status: Secondary | ICD-10-CM | POA: Diagnosis not present

## 2018-08-16 DIAGNOSIS — D72828 Other elevated white blood cell count: Secondary | ICD-10-CM | POA: Diagnosis not present

## 2018-08-16 DIAGNOSIS — I252 Old myocardial infarction: Secondary | ICD-10-CM | POA: Diagnosis not present

## 2018-08-16 DIAGNOSIS — E785 Hyperlipidemia, unspecified: Secondary | ICD-10-CM | POA: Diagnosis present

## 2018-08-16 DIAGNOSIS — I495 Sick sinus syndrome: Secondary | ICD-10-CM | POA: Diagnosis present

## 2018-08-16 DIAGNOSIS — L97909 Non-pressure chronic ulcer of unspecified part of unspecified lower leg with unspecified severity: Secondary | ICD-10-CM | POA: Diagnosis present

## 2018-08-16 DIAGNOSIS — I482 Chronic atrial fibrillation, unspecified: Secondary | ICD-10-CM | POA: Diagnosis present

## 2018-08-16 DIAGNOSIS — M109 Gout, unspecified: Secondary | ICD-10-CM | POA: Diagnosis present

## 2018-08-16 DIAGNOSIS — I4811 Longstanding persistent atrial fibrillation: Secondary | ICD-10-CM | POA: Diagnosis not present

## 2018-08-16 DIAGNOSIS — Z885 Allergy status to narcotic agent status: Secondary | ICD-10-CM | POA: Diagnosis not present

## 2018-08-16 DIAGNOSIS — K5900 Constipation, unspecified: Secondary | ICD-10-CM | POA: Diagnosis present

## 2018-08-16 LAB — BASIC METABOLIC PANEL
ANION GAP: 10 (ref 5–15)
BUN: 30 mg/dL — ABNORMAL HIGH (ref 8–23)
CALCIUM: 8.7 mg/dL — AB (ref 8.9–10.3)
CHLORIDE: 96 mmol/L — AB (ref 98–111)
CO2: 31 mmol/L (ref 22–32)
Creatinine, Ser: 1.16 mg/dL (ref 0.61–1.24)
GFR calc non Af Amer: 52 mL/min — ABNORMAL LOW (ref >60)
Glucose, Bld: 102 mg/dL — ABNORMAL HIGH (ref 70–99)
POTASSIUM: 2.4 mmol/L — AB (ref 3.5–5.1)
Sodium: 137 mmol/L (ref 135–145)

## 2018-08-16 LAB — PROTIME-INR
INR: 2.75
Prothrombin Time: 28.9 seconds — ABNORMAL HIGH (ref 11.4–15.2)

## 2018-08-16 LAB — CBC
HCT: 40.7 % (ref 39.0–52.0)
HEMOGLOBIN: 13.4 g/dL (ref 13.0–17.0)
MCH: 33.1 pg (ref 26.0–34.0)
MCHC: 32.9 g/dL (ref 30.0–36.0)
MCV: 100.5 fL — ABNORMAL HIGH (ref 80.0–100.0)
Platelets: 207 10*3/uL (ref 150–400)
RBC: 4.05 MIL/uL — AB (ref 4.22–5.81)
RDW: 14.1 % (ref 11.5–15.5)
WBC: 9.2 10*3/uL (ref 4.0–10.5)

## 2018-08-16 LAB — POTASSIUM: Potassium: 3.2 mmol/L — ABNORMAL LOW (ref 3.5–5.1)

## 2018-08-16 MED ORDER — SODIUM CHLORIDE 0.9 % IV BOLUS
1000.0000 mL | Freq: Once | INTRAVENOUS | Status: AC
  Administered 2018-08-16: 1000 mL via INTRAVENOUS

## 2018-08-16 MED ORDER — WARFARIN - PHARMACIST DOSING INPATIENT
Freq: Every day | Status: DC
  Administered 2018-08-16 – 2018-08-17 (×2)

## 2018-08-16 MED ORDER — ATENOLOL 50 MG PO TABS
50.0000 mg | ORAL_TABLET | Freq: Every day | ORAL | Status: DC
  Administered 2018-08-16 – 2018-08-18 (×3): 50 mg via ORAL
  Filled 2018-08-16 (×3): qty 1

## 2018-08-16 MED ORDER — SENNOSIDES-DOCUSATE SODIUM 8.6-50 MG PO TABS
2.0000 | ORAL_TABLET | Freq: Every day | ORAL | Status: DC
  Administered 2018-08-16: 2 via ORAL
  Filled 2018-08-16 (×2): qty 2

## 2018-08-16 MED ORDER — GABAPENTIN 100 MG PO CAPS
100.0000 mg | ORAL_CAPSULE | Freq: Every evening | ORAL | Status: DC | PRN
  Administered 2018-08-16 – 2018-08-18 (×2): 100 mg via ORAL
  Filled 2018-08-16 (×2): qty 1

## 2018-08-16 MED ORDER — ENSURE ENLIVE PO LIQD
237.0000 mL | Freq: Two times a day (BID) | ORAL | Status: DC
  Administered 2018-08-16 – 2018-08-18 (×4): 237 mL via ORAL

## 2018-08-16 MED ORDER — POTASSIUM CHLORIDE 10 MEQ/100ML IV SOLN
INTRAVENOUS | Status: AC
  Administered 2018-08-16: 10 meq
  Filled 2018-08-16: qty 200

## 2018-08-16 MED ORDER — IPRATROPIUM-ALBUTEROL 0.5-2.5 (3) MG/3ML IN SOLN: 3.0000 mL | Freq: Four times a day (QID) | RESPIRATORY_TRACT | Status: DC | PRN

## 2018-08-16 MED ORDER — POTASSIUM CHLORIDE 10 MEQ/100ML IV SOLN
10.0000 meq | INTRAVENOUS | Status: AC
  Administered 2018-08-16: 10 meq via INTRAVENOUS

## 2018-08-16 MED ORDER — MAGNESIUM SULFATE 2 GM/50ML IV SOLN
2.0000 g | Freq: Once | INTRAVENOUS | Status: AC
  Administered 2018-08-16: 2 g via INTRAVENOUS
  Filled 2018-08-16: qty 50

## 2018-08-16 MED ORDER — ASPIRIN 81 MG PO CHEW
81.0000 mg | CHEWABLE_TABLET | ORAL | Status: DC
  Administered 2018-08-16 – 2018-08-18 (×2): 81 mg via ORAL
  Filled 2018-08-16 (×3): qty 1

## 2018-08-16 MED ORDER — POTASSIUM CHLORIDE CRYS ER 20 MEQ PO TBCR
60.0000 meq | EXTENDED_RELEASE_TABLET | Freq: Once | ORAL | Status: AC
  Administered 2018-08-16: 60 meq via ORAL
  Filled 2018-08-16: qty 3

## 2018-08-16 MED ORDER — MOMETASONE FURO-FORMOTEROL FUM 200-5 MCG/ACT IN AERO
2.0000 | INHALATION_SPRAY | Freq: Two times a day (BID) | RESPIRATORY_TRACT | Status: DC
  Administered 2018-08-16 – 2018-08-17 (×4): 2 via RESPIRATORY_TRACT
  Filled 2018-08-16: qty 8.8

## 2018-08-16 MED ORDER — WARFARIN SODIUM 2.5 MG PO TABS: 2.5000 mg | ORAL_TABLET | ORAL | Status: DC

## 2018-08-16 MED ORDER — SODIUM CHLORIDE 0.9 % IV SOLN
INTRAVENOUS | Status: AC
  Administered 2018-08-16: 06:00:00 via INTRAVENOUS
  Administered 2018-08-16: 1000 mL via INTRAVENOUS

## 2018-08-16 MED ORDER — VITAMIN D 1000 UNITS PO TABS
4000.0000 [IU] | ORAL_TABLET | Freq: Every day | ORAL | Status: DC
  Administered 2018-08-16 – 2018-08-18 (×3): 4000 [IU] via ORAL
  Filled 2018-08-16 (×3): qty 4

## 2018-08-16 MED ORDER — POTASSIUM CHLORIDE 10 MEQ/100ML IV SOLN
10.0000 meq | INTRAVENOUS | Status: AC
  Administered 2018-08-16 (×4): 10 meq via INTRAVENOUS
  Filled 2018-08-16 (×4): qty 100

## 2018-08-16 MED ORDER — ALLOPURINOL 300 MG PO TABS
300.0000 mg | ORAL_TABLET | Freq: Every day | ORAL | Status: DC
  Administered 2018-08-16 – 2018-08-18 (×3): 300 mg via ORAL
  Filled 2018-08-16 (×3): qty 1

## 2018-08-16 MED ORDER — WARFARIN SODIUM 2.5 MG PO TABS
1.2500 mg | ORAL_TABLET | ORAL | Status: DC
  Administered 2018-08-16: 1.25 mg via ORAL
  Filled 2018-08-16: qty 0.5

## 2018-08-16 NOTE — NC FL2 (Signed)
[place] LEVEL OF CARE SCREENING TOOL     IDENTIFICATION  Patient Name: [name] Birthdate: 1923/03/28 Sex: male Admission Date (Current Location): 08/15/2018  [place] and [place] Number:  Herbalist and Address:  [facility], [address] [place], [place] [address]      Provider Number: [id number]  Attending Physician Name and Address:  Lady Deutscher, MD  Relative Name and Phone Number:       Current Level of Care: Hospital Recommended Level of Care: [place] Prior Approval Number:    Date Approved/Denied:   PASRR Number: [id number]  Discharge Plan: SNF    Current Diagnoses: Patient Active Problem List   Diagnosis Date Noted  . Tachy-brady syndrome ([place]) 08/16/2018  . Hypokalemia 08/16/2018  . Hypertension 08/16/2018  . Chronic diastolic CHF (congestive heart failure) ([place]) 08/16/2018  . Lightheadedness 08/15/2018  . PVD (peripheral vascular disease) ([place]) 08/09/2018  . Acute on chronic diastolic (congestive) heart failure ([place]) 04/06/2018  . COPD with acute exacerbation ([place]) 04/04/2018  . Acute on chronic diastolic CHF (congestive heart failure) ([place]) 02/28/2018  . Lymph edema 10/07/2017  . Candidiasis 09/02/2017  . Acute bronchospasm   . CHF (congestive heart failure) ([place])   . Acute respiratory failure ([place]) 08/17/2017  . Dyspnea 08/15/2017  . Pressure injury of skin 03/22/2017  . Weakness 03/21/2017  . Venous ulcer of left leg ([place]) 01/21/2017  . Fever in adult 09/26/2015  . Gout 09/26/2015  . Leukocytosis 09/26/2015  . Venous stasis dermatitis of both lower extremities 07/17/2015  . Bilateral lower leg cellulitis 07/09/2015  . Sepsis due to other organism, without resultant organ failure 07/07/2015  . Chronic combined systolic and diastolic CHF (congestive heart failure) ([place]) 07/07/2015  . Varicose veins of left leg with both ulcer and inflammation ([place]) 02/05/2014  .  Venous hypertension of lower extremity 02/05/2014  . Stasis dermatitis of both legs 02/05/2014  . Onychomycosis 12/04/2013  . Hx of CABG 04/25/2013  . Coronary artery disease 11/29/2011  . Venous stasis ulcer ([place]) 11/29/2011  . Neuropathy 11/29/2011  . Generalized weakness 11/29/2011  . Atrial fibrillation ([place]) 12/02/2010  . PPM-[place] 12/02/2010    Orientation RESPIRATION BLADDER Height & Weight     Self, Time, Situation, Place  Normal Continent Weight: 173 lb 8 oz (78.7 kg) Height:  5' (152.4 cm)  BEHAVIORAL SYMPTOMS/MOOD NEUROLOGICAL BOWEL NUTRITION STATUS  (None) (None) Continent Diet(Heart healthy)  AMBULATORY STATUS COMMUNICATION OF NEEDS Skin   Limited Assist Verbally Other (Comment)(Rash.)                       Personal Care Assistance Level of Assistance  Bathing, Feeding, Dressing Bathing Assistance: Limited assistance Feeding assistance: Independent Dressing Assistance: Limited assistance     Functional Limitations Info  Sight, Hearing, Speech Sight Info: Adequate Hearing Info: Adequate Speech Info: Adequate    SPECIAL CARE FACTORS FREQUENCY  PT (By licensed PT), Blood pressure, OT (By licensed OT)     PT Frequency: 5 x week OT Frequency: 5 x week            Contractures Contractures Info: Not present    Additional Factors Info  Code Status, Allergies Code Status Info: DNR Allergies Info: Avelox (Moxifloxacin Hcl In Nacl), Indomethacin, Levaquin (Levofloxacin), Lipitor (Atorvastatin), Morphine And Related, Pravachol (Pravastatin Sodium), Aleve (Naproxen Sodium), Clindamycin/lincomycin, Norpace (Disopyramide Phosphate), Tripelennamine, Amlodipine Besylate, Azithromycin, Bactrim (Sulfamethoxazole-trimethoprim), Clinoril (Sulindac), Codeine, Digoxin, Lorazepam, Oxycodone-acetaminophen, Sulfonamide Derivatives, Uloric (  Febuxostat)           Current Medications (08/16/2018):  This is the current hospital active medication list Current  Facility-Administered Medications  Medication Dose Route Frequency Provider Last Rate Last Dose  . 0.9 %  sodium chloride infusion   Intravenous Continuous Shela Leff, MD 100 mL/hr at 08/16/18 0600    . allopurinol (ZYLOPRIM) tablet 300 mg  300 mg Oral Daily Shela Leff, MD   300 mg at 08/16/18 0851  . aspirin chewable tablet 81 mg  81 mg Oral Derrek Gu, MD   81 mg at 08/16/18 0852  . atenolol (TENORMIN) tablet 50 mg  50 mg Oral Daily Shela Leff, MD   50 mg at 08/16/18 0852  . cholecalciferol (VITAMIN D) tablet 4,000 Units  4,000 Units Oral Daily Shela Leff, MD   4,000 Units at 08/16/18 0851  . feeding supplement (ENSURE ENLIVE) (ENSURE ENLIVE) liquid 237 mL  237 mL Oral BID BM Shela Leff, MD   237 mL at 08/16/18 0854  . gabapentin (NEURONTIN) capsule 100 mg  100 mg Oral QHS PRN Shela Leff, MD      . ipratropium-albuterol (DUONEB) 0.5-2.5 (3) MG/3ML nebulizer solution 3 mL  3 mL Nebulization Q6H PRN Shela Leff, MD      . mometasone-formoterol (DULERA) 200-5 MCG/ACT inhaler 2 puff  2 puff Inhalation BID Shela Leff, MD   2 puff at 08/16/18 0929  . senna-docusate (Senokot-S) tablet 2 tablet  2 tablet Oral QHS Shela Leff, MD      . warfarin (COUMADIN) tablet 1.25 mg  1.25 mg Oral Q48H Bryk, Veronda P, RPH      . [START ON 08/17/2018] warfarin (COUMADIN) tablet 2.5 mg  2.5 mg Oral Q48H Bryk, Veronda P, RPH      . Warfarin - Pharmacist Dosing Inpatient   Does not apply q1800 Laren Everts, [facility]         Discharge Medications: Please see discharge summary for a list of discharge medications.  Relevant Imaging Results:  Relevant Lab Results:   Additional Information SS#: [id number]  Candie Chroman, LCSW

## 2018-08-16 NOTE — H&P (Addendum)
History and Physical    [name] PNT:[id number] DOB: 03-06-1923 DOA: 08/15/2018  PCP: Lavone Orn, MD Patient coming from: Home  Chief Complaint: Lightheadedness  HPI: [name] is a 84 y.o. male with medical history significant of hypertension, hyperlipidemia, chronic atrial fibrillation, tachybradycardia syndrome status post permanent pacemaker, chronic diastolic congestive heart failure, chronic venous insufficiency presenting to the hospital for further evaluation of lightheadedness.  Patient states he woke up after nap this afternoon and did not feel good.  He then used a walker to go to the bathroom and felt very lightheaded and unsteady.  He felt weak and it was difficult for him to get back in a chair.  States his appetite has been low and his mouth feels dry.  He takes Lasix and metolazone for chronic diastolic congestive heart failure.  Dose of Lasix has not changed recently but patient son mentions that due to worsening lower extremity edema, patient has had to take metolazone more than usual (twice a week).  He continues to take his home potassium supplement.  Patient denies loss of consciousness during this episode and son at bedside confirmed.  Patient denies having any chest pain or shortness of breath when this happened.  Denies having any fevers.  States he felt nauseous this morning but did not vomit.  Currently not nauseous.  No recent diarrhea.  Son states that the patient lives on his own and another family member helps him fill his pillbox.  ED Course: Found to be hypokalemic with potassium 2.6 and normal mag.  Creatinine 1.3; baseline 1.1-1.2.  White count 11.4.  Troponin negative.  EKG with paced rhythm and A. fib.  UA not suggestive of infection.  Per ED provider, mild blood pressure drop when standing in the ED (systolic 086 to 761), no change in heart rate.  Head CT with no acute intracranial abnormalities.  Patient received IV potassium 10 mEq once in the  ED.  Review of Systems: As per HPI otherwise 10 point review of systems negative.  Past Medical History:  Diagnosis Date  . Arrhythmia    afib  . Arthritis    "back and hands" (09/26/2015)  . Basal cell carcinoma   . CHF (congestive heart failure) ([place])   . Coronary artery disease   . CVI (common variable immunodeficiency) ([place])   . DJD (degenerative joint disease)   . ED (erectile dysfunction)   . GERD (gastroesophageal reflux disease)   . Gout   . Heart attack ([place]) 1986  . Hyperlipidemia   . Hypertension   . IFG (impaired fasting glucose)   . Neuropathy   . Non Hodgkin's lymphoma ([place]) dx'd 2006  . PAC (premature atrial contraction)   . PE (pulmonary embolism) 2007   "when I was taking chemo"  . Peptic ulcer disease   . Pneumonia ~ 2007 X 2  . Squamous carcinoma   . Venous stasis ulcers (HCC)     Past Surgical History:  Procedure Laterality Date  . BACK SURGERY    . CARDIAC CATHETERIZATION  1990  . CATARACT EXTRACTION W/ INTRAOCULAR LENS  IMPLANT, BILATERAL Bilateral 10/2006 ~ 11/2006  . CORONARY ANGIOPLASTY    . CORONARY ARTERY BYPASS GRAFT  1990   CABG X3  . ELBOW SURGERY Left    "related to gout"  . INSERT / REPLACE / REMOVE PACEMAKER  08/2010  . KNEE ARTHROSCOPY Left   . LUMBAR DISC SURGERY     "had pinched nerve; had to make room for  it"  . [place]   "related to bacteria infection in sinus"  . SQUAMOUS CELL CARCINOMA EXCISION Right 09/12/2015   cheek  . TONSILLECTOMY  1972     reports that he has quit smoking. His smoking use included cigarettes. He has a 1.00 pack-year smoking history. He has never used smokeless tobacco. He reports that he does not drink alcohol or use drugs.  Allergies  Allergen Reactions  . Avelox [Moxifloxacin Hcl In Nacl] Other (See Comments)    Messed up lungs, Respiratory Distress   . Indomethacin Other (See Comments)    Renal insufficiency  . Levaquin [Levofloxacin] Other (See Comments)    Messed up  lungs  . Lipitor [Atorvastatin] Nausea Only, Palpitations and Other (See Comments)    Irregular heart beat also  . Morphine And Related Other (See Comments)    Irregular heart beart  . Pravachol [Pravastatin Sodium] Nausea Only, Palpitations and Other (See Comments)    Irregular heart beat  . Aleve [Naproxen Sodium] Nausea And Vomiting and Other (See Comments)    GI Upset  . Clindamycin/Lincomycin Other (See Comments)    Severe acid reflux and stomach pain  . Norpace [Disopyramide Phosphate] Other (See Comments)    Urinary retention  . Tripelennamine   . Amlodipine Besylate Rash  . Azithromycin Rash  . Bactrim [Sulfamethoxazole-Trimethoprim] Rash  . Clinoril [Sulindac] Rash  . Codeine Other (See Comments)    unknown  . Digoxin Rash  . Lorazepam Other (See Comments)    unknown  . Oxycodone-Acetaminophen Rash  . Sulfonamide Derivatives Rash  . Uloric [Febuxostat] Other (See Comments)    unknown    Family History  Problem Relation Age of Onset  . Obesity Mother   . Lung disease Father   . Diabetes type II Sister   . Heart disease Brother   . Diabetes type II Daughter     Prior to Admission medications   Medication Sig Start Date End Date Taking? Authorizing Provider  acetaminophen (TYLENOL) 500 MG tablet Take 500 mg by mouth at bedtime.    Yes [provider]  albuterol (PROVENTIL) (2.5 MG/3ML) 0.083% nebulizer solution Take 3 mLs (2.5 mg total) by nebulization every 2 (two) hours as needed for wheezing or shortness of breath. 03/04/18  Yes Hosie Poisson, MD  allopurinol (ZYLOPRIM) 300 MG tablet Take 300 mg by mouth daily.    Yes [provider]  ALPRAZolam Duanne Moron) 0.5 MG tablet Take 1 tablet (0.5 mg total) by mouth at bedtime as needed for anxiety. For sleep 03/23/17  Yes Geradine Girt, DO  aspirin 81 MG tablet Take 1 tablet (81 mg total) by mouth every other day. 04/07/18  Yes Emokpae, Courage, MD  atenolol (TENORMIN) 50 MG tablet Take 1 tablet (50 mg  total) by mouth daily. 04/07/18  Yes Emokpae, Courage, MD  cholecalciferol 4000 units TABS Take 4,000 Units by mouth daily. 04/08/18  Yes Emokpae, Courage, MD  clotrimazole-betamethasone (LOTRISONE) cream Apply 1 application topically 2 (two) times daily as needed (rash).  08/11/17  Yes [provider]  colchicine 0.6 MG tablet Take 0.6 mg by mouth daily as needed (gout).    Yes [provider]  furosemide (LASIX) 80 MG tablet Take 80 mg by mouth 2 (two) times daily.   Yes [provider]  gabapentin (NEURONTIN) 100 MG capsule Take 100 mg by mouth at bedtime as needed (nerve pain).    Yes [provider]  ipratropium-albuterol (DUONEB) 0.5-2.5 (3) MG/3ML SOLN  Take 3 mLs by nebulization every 6 (six) hours as needed. 03/04/18  Yes Hosie Poisson, MD  metolazone (ZAROXOLYN) 2.5 MG tablet Take 2.5 mg by mouth daily as needed (if weight is over 180 pounds).   Yes [provider]  miconazole (MICOTIN) 2 % powder Apply topically See admin instructions. Apply topically daily after bath - for rash   Yes [provider]  mometasone-formoterol (DULERA) 200-5 MCG/ACT AERO Inhale 2 puffs into the lungs 2 (two) times daily. 03/04/18  Yes Hosie Poisson, MD  nitroGLYCERIN (NITROSTAT) 0.4 MG SL tablet Place 0.4 mg under the tongue every 5 (five) minutes as needed for chest pain.   Yes [provider]  nystatin (MYCOSTATIN) 100000 UNIT/ML suspension Take 5 mLs by mouth 2 (two) times daily. Swish and swallow 03/08/18  Yes [provider]  potassium chloride SA (K-DUR,KLOR-CON) 20 MEQ tablet Take 40 mEq by mouth 2 (two) times daily.    Yes [provider]  senna-docusate (SENOKOT-S) 8.6-50 MG tablet Take 2 tablets by mouth at bedtime. 04/07/18  Yes Roxan Hockey, MD  warfarin (COUMADIN) 2.5 MG tablet Take 1 tablet (2.5 mg total) by mouth daily at 6 PM. Or as directed Patient taking differently: Take 1.25-2.5 mg by mouth See admin instructions.  Alternate taking 1 tablet one day and 1/2 tablet the next day 03/05/18  Yes Hosie Poisson, MD  benzonatate (TESSALON) 200 MG capsule Take 1 capsule (200 mg total) by mouth 2 (two) times daily as needed for cough. Patient not taking: Reported on 08/15/2018 03/04/18   Hosie Poisson, MD  feeding supplement, ENSURE ENLIVE, (ENSURE ENLIVE) LIQD Take 237 mLs by mouth 2 (two) times daily between meals. 04/07/18   Roxan Hockey, MD  furosemide (LASIX) 40 MG tablet 80 mg every AM and 40 mg every PM Patient not taking: Reported on 08/15/2018 04/07/18   Roxan Hockey, MD  guaiFENesin (MUCINEX) 600 MG 12 hr tablet Take 1 tablet (600 mg total) by mouth 2 (two) times daily. Patient not taking: Reported on 08/15/2018 04/07/18   Roxan Hockey, MD  predniSONE (DELTASONE) 20 MG tablet Take 1 tablet (20 mg total) by mouth daily with breakfast. Patient not taking: Reported on 08/15/2018 04/08/18   Roxan Hockey, MD    Physical Exam: Vitals:   08/15/18 2230 08/15/18 2300 08/15/18 2330 08/16/18 0041  BP: 130/68 124/63 118/60 108/75  Pulse: 70 65 64 (!) 59  Resp: 20 13 19  (!) 24  Temp:    (!) 97.5 F (36.4 C)  TempSrc:    Oral  SpO2: 97% 96% 97% 99%  Weight:    78.7 kg  Height:    5' (1.524 m)   Physical Exam  Constitutional: He is oriented to person, place, and time.  Frail elderly male  HENT:  Dry mucous membranes  Eyes: Pupils are equal, round, and reactive to light. EOM are normal.  Neck: Neck supple. No tracheal deviation present.  No bruit appreciated bilaterally  Cardiovascular: Normal rate, regular rhythm and intact distal pulses. Exam reveals no gallop and no friction rub.  Pulmonary/Chest: Effort normal and breath sounds normal. No respiratory distress. He has no wheezes. He has no rales.  Abdominal: Soft. Bowel sounds are normal. He exhibits no distension. There is no tenderness.  Musculoskeletal:  Bilateral lower extremities wrapped in bandage  Neurological: He is alert and oriented to  person, place, and time. No cranial nerve deficit.  Strength 5 out of 5 in bilateral upper extremities  Skin: Skin is warm and  dry.  Psychiatric: His behavior is normal.     Labs on Admission: I have personally reviewed following labs and imaging studies  CBC: Recent Labs  Lab 08/15/18 2115  WBC 11.4*  HGB 14.8  HCT 44.8  MCV 100.9*  PLT 213   Basic Metabolic Panel: Recent Labs  Lab 08/15/18 2115 08/15/18 2314  NA 133*  --   K 2.6*  --   CL 90*  --   CO2 30  --   GLUCOSE 111*  --   BUN 36*  --   CREATININE 1.39*  --   CALCIUM 9.8  --   MG  --  1.8   GFR: Estimated Creatinine Clearance: 28.9 mL/min (A) (by C-G formula based on SCr of 1.39 mg/dL (H)). Liver Function Tests: No results for input(s): AST, ALT, ALKPHOS, BILITOT, PROT, ALBUMIN in the last 168 hours. No results for input(s): LIPASE, AMYLASE in the last 168 hours. No results for input(s): AMMONIA in the last 168 hours. Coagulation Profile: Recent Labs  Lab 08/15/18 2115  INR 2.54   Cardiac Enzymes: Recent Labs  Lab 08/15/18 2115  TROPONINI <0.03   BNP (last 3 results) No results for input(s): PROBNP in the last 8760 hours. HbA1C: No results for input(s): HGBA1C in the last 72 hours. CBG: Recent Labs  Lab 08/15/18 2111  GLUCAP 106*   Lipid Profile: No results for input(s): CHOL, HDL, LDLCALC, TRIG, CHOLHDL, LDLDIRECT in the last 72 hours. Thyroid Function Tests: No results for input(s): TSH, T4TOTAL, FREET4, T3FREE, THYROIDAB in the last 72 hours. Anemia Panel: No results for input(s): VITAMINB12, FOLATE, FERRITIN, TIBC, IRON, RETICCTPCT in the last 72 hours. Urine analysis:    Component Value Date/Time   COLORURINE STRAW (A) 08/15/2018 2128   APPEARANCEUR CLEAR 08/15/2018 2128   LABSPEC 1.006 08/15/2018 2128   PHURINE 7.0 08/15/2018 2128   GLUCOSEU NEGATIVE 08/15/2018 2128   HGBUR SMALL (A) 08/15/2018 2128   BILIRUBINUR NEGATIVE 08/15/2018 2128   KETONESUR NEGATIVE 08/15/2018 2128    PROTEINUR NEGATIVE 08/15/2018 2128   UROBILINOGEN 0.2 07/08/2015 0428   NITRITE NEGATIVE 08/15/2018 2128   LEUKOCYTESUR NEGATIVE 08/15/2018 2128    Radiological Exams on Admission: Ct Head Wo Contrast  Result Date: 08/15/2018 CLINICAL DATA:  Sudden onset generalized weakness at noon today. Blurry vision when standing. Nausea and headache. EXAM: CT HEAD WITHOUT CONTRAST TECHNIQUE: Contiguous axial images were obtained from the base of the skull through the vertex without intravenous contrast. COMPARISON:  03/21/2017 FINDINGS: Brain: Diffuse cerebral atrophy. Ventricular dilatation consistent with central atrophy. Low-attenuation changes throughout the deep white matter consistent with small vessel ischemic changes and lacunar infarcts. No mass effect or midline shift. No abnormal extra-axial fluid collections. Gray-white matter junctions are distinct. Basal cisterns are not effaced. No acute intracranial hemorrhage. Vascular: Moderate intracranial arterial calcifications are present. Skull: Calvarium appears intact. Sinuses/Orbits: Old fracture deformity of the left medial orbital wall. No significant opacification of the paranasal sinuses or mastoid air cells. Other: None. IMPRESSION: No acute intracranial abnormalities. Chronic atrophy and small vessel ischemic changes. Electronically Signed   By: Lucienne Capers M.D.   On: 08/15/2018 22:42    EKG: Independently reviewed.  Paced rhythm (heart rate 62) and A. fib.  Assessment/Plan Principal Problem:   Lightheadedness Active Problems:   Atrial fibrillation (HCC)   Venous stasis ulcer (HCC)   Neuropathy   Venous stasis dermatitis of both lower extremities   Gout   Leukocytosis   Tachy-brady syndrome (HCC)   Hypokalemia  Hypertension   Chronic diastolic CHF (congestive heart failure) (HCC)   Lightheadedness secondary to likely orthostatic hypotension In the setting of diuretic use at home including Lasix and metolazone .  Patient has  been requiring more metolazone lately due to worsening lower extremity edema. ACS less likely as troponin negative. EKG with paced rhythm and A. fib.  PE less likely as patient is not hypoxemic. Per ED provider, mild blood pressure drop when standing in the ED (systolic 774 to 142). Head CT with no acute intracranial abnormalities.   -He has chronic lower extremity edema. Lungs clear on exam.  Will give a bolus of 1 L normal saline and maintenance fluids. Repeat orthostatics in the morning. -Hold home Lasix and metolazone -Creatinine 1.3; baseline 1.1-1.2.  Hypokalemia Potassium 2.6 and normal mag.  EKG paced. -IV potassium 10 meq x 4 and oral K-Dur 60 meq -BMP in a.m.  Mild leukocytosis White count 11.4.  Likely stress reaction.  Patient is afebrile.  UA not suggestive of infection.  Lungs clear on exam. -Repeat CBC in a.m.  Chronic atrial fibrillation EKG with paced rhythm (heart rate 62) and A. Fib. CHA2DS2Vasc 4.  -Continue home warfarin  Tachybradycardia syndrome -Pacemaker interrogated on October 1.  Normal function.  Hypertension  -Continue home atenolol  Chronic diastolic congestive heart failure Currently orthostatic and appears dry.  Lungs clear on exam.  Lower extremity edema is chronic. -Hold home Lasix and metolazone -Continue home atenolol  Chronic venous insufficiency, venous stasis ulcers/ dermatitis  -Wound care.  Keep legs wrapped in bandaged.  Keep legs elevated.  Gout -Continue home allopurinol  Neuropathy -Continue home gabapentin  COPD -Continue home Dulera -Continue home albuterol, ipratropium as needed  Deconditioning -PT consult   DVT prophylaxis: Lovenox Code Status: Patient wishes to be DNR Family Communication: Son at bedside updated Disposition Plan: Anticipate discharge in 1 to 2 days to home Consults called: None Admission status: Observation   Shela Leff MD [facility] Pager [phone]  If 7PM-7AM, please  contact night-coverage [email or web address] Password [email or web address]  08/16/2018, 3:41 AM

## 2018-08-16 NOTE — Progress Notes (Signed)
ANTICOAGULATION CONSULT NOTE - Initial Consult  Pharmacy Consult for Coumadin Indication: atrial fibrillation  Allergies  Allergen Reactions  . Avelox [Moxifloxacin Hcl In Nacl] Other (See Comments)    Messed up lungs, Respiratory Distress   . Indomethacin Other (See Comments)    Renal insufficiency  . Levaquin [Levofloxacin] Other (See Comments)    Messed up lungs  . Lipitor [Atorvastatin] Nausea Only, Palpitations and Other (See Comments)    Irregular heart beat also  . Morphine And Related Other (See Comments)    Irregular heart beart  . Pravachol [Pravastatin Sodium] Nausea Only, Palpitations and Other (See Comments)    Irregular heart beat  . Aleve [Naproxen Sodium] Nausea And Vomiting and Other (See Comments)    GI Upset  . Clindamycin/Lincomycin Other (See Comments)    Severe acid reflux and stomach pain  . Norpace [Disopyramide Phosphate] Other (See Comments)    Urinary retention  . Tripelennamine   . Amlodipine Besylate Rash  . Azithromycin Rash  . Bactrim [Sulfamethoxazole-Trimethoprim] Rash  . Clinoril [Sulindac] Rash  . Codeine Other (See Comments)    unknown  . Digoxin Rash  . Lorazepam Other (See Comments)    unknown  . Oxycodone-Acetaminophen Rash  . Sulfonamide Derivatives Rash  . Uloric [Febuxostat] Other (See Comments)    unknown    Patient Measurements: Height: 5' (152.4 cm) Weight: 173 lb 8 oz (78.7 kg) IBW/kg (Calculated) : 50  Vital Signs: Temp: 97.5 F (36.4 C) (10/08 0041) Temp Source: Oral (10/08 0041) BP: 108/75 (10/08 0041) Pulse Rate: 59 (10/08 0041)  Labs: Recent Labs    08/15/18 2115  HGB 14.8  HCT 44.8  PLT 234  LABPROT 27.2*  INR 2.54  CREATININE 1.39*  TROPONINI <0.03    Estimated Creatinine Clearance: 28.9 mL/min (A) (by C-G formula based on SCr of 1.39 mg/dL (H)).   Medical History: Past Medical History:  Diagnosis Date  . Arrhythmia    afib  . Arthritis    "back and hands" (09/26/2015)  . Basal cell  carcinoma   . CHF (congestive heart failure) ([place])   . Coronary artery disease   . CVI (common variable immunodeficiency) ([place])   . DJD (degenerative joint disease)   . ED (erectile dysfunction)   . GERD (gastroesophageal reflux disease)   . Gout   . Heart attack ([place] [place]) 1986  . Hyperlipidemia   . Hypertension   . IFG (impaired fasting glucose)   . Neuropathy   . Non Hodgkin's lymphoma ([place]) dx'd 2006  . PAC (premature atrial contraction)   . PE (pulmonary embolism) 2007   "when I was taking chemo"  . Peptic ulcer disease   . Pneumonia ~ 2007 X 2  . Squamous carcinoma   . Venous stasis ulcers ([facility])     Medications:  Medications Prior to Admission  Medication Sig Dispense Refill Last Dose  . acetaminophen (TYLENOL) 500 MG tablet Take 500 mg by mouth at bedtime.    08/14/2018 at Unknown time  . albuterol (PROVENTIL) (2.5 MG/3ML) 0.083% nebulizer solution Take 3 mLs (2.5 mg total) by nebulization every 2 (two) hours as needed for wheezing or shortness of breath. 75 mL 12 Past Week at Unknown time  . allopurinol (ZYLOPRIM) 300 MG tablet Take 300 mg by mouth daily.    08/15/2018 at Unknown time  . ALPRAZolam (XANAX) 0.5 MG tablet Take 1 tablet (0.5 mg total) by mouth at bedtime as needed for anxiety. For sleep 5 tablet 0 Past Week at Unknown time  .  aspirin 81 MG tablet Take 1 tablet (81 mg total) by mouth every other day. 30 tablet 5 08/15/2018 at Unknown time  . atenolol (TENORMIN) 50 MG tablet Take 1 tablet (50 mg total) by mouth daily. 30 tablet 5 08/15/2018 at 0800  . cholecalciferol 4000 units TABS Take 4,000 Units by mouth daily. 30 tablet 5 08/15/2018 at Unknown time  . clotrimazole-betamethasone (LOTRISONE) cream Apply 1 application topically 2 (two) times daily as needed (rash).   0 Past Week at Unknown time  . colchicine 0.6 MG tablet Take 0.6 mg by mouth daily as needed (gout).    Past Week at Unknown time  . furosemide (LASIX) 80 MG tablet Take 80 mg by mouth 2 (two) times daily.    08/15/2018 at Unknown time  . gabapentin (NEURONTIN) 100 MG capsule Take 100 mg by mouth at bedtime as needed (nerve pain).    Past Week at Unknown time  . ipratropium-albuterol (DUONEB) 0.5-2.5 (3) MG/3ML SOLN Take 3 mLs by nebulization every 6 (six) hours as needed. 360 mL 5 Past Week at Unknown time  . metolazone (ZAROXOLYN) 2.5 MG tablet Take 2.5 mg by mouth daily as needed (if weight is over 180 pounds).   08/13/2018 at Unknown time  . miconazole (MICOTIN) 2 % powder Apply topically See admin instructions. Apply topically daily after bath - for rash   08/15/2018 at Unknown time  . mometasone-formoterol (DULERA) 200-5 MCG/ACT AERO Inhale 2 puffs into the lungs 2 (two) times daily. 13 g 0 08/15/2018 at Unknown time  . nitroGLYCERIN (NITROSTAT) 0.4 MG SL tablet Place 0.4 mg under the tongue every 5 (five) minutes as needed for chest pain.   unknown  . nystatin (MYCOSTATIN) 100000 UNIT/ML suspension Take 5 mLs by mouth 2 (two) times daily. Swish and swallow  0 08/15/2018 at Unknown time  . potassium chloride SA (K-DUR,KLOR-CON) 20 MEQ tablet Take 40 mEq by mouth 2 (two) times daily.    08/15/2018 at Unknown time  . senna-docusate (SENOKOT-S) 8.6-50 MG tablet Take 2 tablets by mouth at bedtime. 60 tablet 5 08/14/2018 at Unknown time  . warfarin (COUMADIN) 2.5 MG tablet Take 1 tablet (2.5 mg total) by mouth daily at 6 PM. Or as directed (Patient taking differently: Take 1.25-2.5 mg by mouth See admin instructions. Alternate taking 1 tablet one day and 1/2 tablet the next day)   08/15/2018 at 1800  . benzonatate (TESSALON) 200 MG capsule Take 1 capsule (200 mg total) by mouth 2 (two) times daily as needed for cough. (Patient not taking: Reported on 08/15/2018) 20 capsule 0 Completed Course at Unknown time  . feeding supplement, ENSURE ENLIVE, (ENSURE ENLIVE) LIQD Take 237 mLs by mouth 2 (two) times daily between meals. 60 Bottle 5 Taking  . furosemide (LASIX) 40 MG tablet 80 mg every AM and 40 mg every PM  (Patient not taking: Reported on 08/15/2018) 120 tablet 0 Not Taking at Unknown time  . guaiFENesin (MUCINEX) 600 MG 12 hr tablet Take 1 tablet (600 mg total) by mouth 2 (two) times daily. (Patient not taking: Reported on 08/15/2018) 20 tablet 0 Not Taking at Unknown time  . predniSONE (DELTASONE) 20 MG tablet Take 1 tablet (20 mg total) by mouth daily with breakfast. (Patient not taking: Reported on 08/15/2018) 3 tablet 0 Completed Course at Unknown time   Scheduled:  . allopurinol  300 mg Oral Daily  . aspirin  81 mg Oral QODAY  . atenolol  50 mg Oral Daily  . Cholecalciferol  4,000 Units Oral Daily  . feeding supplement (ENSURE ENLIVE)  237 mL Oral BID BM  . mometasone-formoterol  2 puff Inhalation BID  . potassium chloride  60 mEq Oral Once  . senna-docusate  2 tablet Oral QHS    Assessment: 84yo male c/o generalized weakness, admitted for further w/u, to continue Coumadin; current INR at goal, last dose of Coumadin taken 10/7 PTA.  Goal of Therapy:  INR 2-3   Plan:  Will continue home Coumadin dose of 2.5mg  alternating with 1.25mg  daily and monitor INR.  Wynona Neat, PharmD, BCPS  08/16/2018,3:55 AM

## 2018-08-16 NOTE — Clinical Social Work Note (Signed)
Clinical Social Work Assessment  Patient Details  Name: [name] MRN: [id number] Date of Birth: Aug 26, 1923  Date of referral:  08/16/18               Reason for consult:  Facility Placement, Discharge Planning                Permission sought to share information with:  Facility Sport and exercise psychologist, Family Supports Permission granted to share information::  Yes, Verbal Permission Granted  Name::     [name]  Agency::  SNF's  Relationship::  Son  Contact Information:  [phone]  Housing/Transportation Living arrangements for the past 2 months:  Single Family Home Source of Information:  Patient, Medical Team, Adult Children Patient Interpreter Needed:  None Criminal Activity/Legal Involvement Pertinent to Current Situation/Hospitalization:  No - Comment as needed Significant Relationships:  Adult Children, Other Family Members Lives with:  Self Do you feel safe going back to the place where you live?  Yes Need for family participation in patient care:  Yes (Comment)  Care giving concerns:  PT recommending SNF placement once medically stable for discharge.   Social Worker assessment / plan:  CSW met with patient. No supports at bedside. CSW introduced role and explained that PT recommendations would be discussed. Patient willing to consider SNF as long as he does not have to return to [facility]. He stated he has been there three times in the past and last time was not a good experience regarding medical care. Patient last discharged from there in June/July. Patient asked CSW to call and discuss with his son. He stated his son lives in [place] and will likely prefer for him to be placed there. CSW called patient's son, [name]. He was surprised but glad that patient is agreeing to SNF. He confirmed not wanting to return to [facility]. He stated he would be happy with placement in either [place] or [place]. Family would be able to visit in [place] easier  and church family would be able to visit in [place] easier. First preference in [place] will likely be [facility] and first preference in [place] will likely be [facility]. CSW left SNF list in the room for them to review together. No further concerns. CSW encouraged patient and his son to contact CSW as needed. CSW will continue to follow patient and his son for support and facilitate discharge to SNF once medically stable.  Employment status:  Retired Nurse, adult PT Recommendations:  [place] / Referral to community resources:  [place]  Patient/Family's Response to care:  Patient and son agreeable to SNF placement. Patient's family and church family supportive and involved in patient's care. Patient and his son appreciated social work intervention.  Patient/Family's Understanding of and Emotional Response to Diagnosis, Current Treatment, and Prognosis:  Patient and son have a good understanding of the reason for admission and his need for rehab prior to returning home. Patient and son appear happy with hospital care.  Emotional Assessment Appearance:  Appears stated age Attitude/Demeanor/Rapport:  Engaged, Gracious Affect (typically observed):  Accepting, Appropriate, Calm, Pleasant Orientation:  Oriented to Self, Oriented to Place, Oriented to  Time, Oriented to Situation Alcohol / Substance use:  Never Used Psych involvement (Current and /or in the community):  No (Comment)  Discharge Needs  Concerns to be addressed:  Care Coordination Readmission within the last 30 days:  No Current discharge risk:  Lives alone, Dependent with Mobility Barriers to Discharge:  Continued Medical Work up, [place], LCSW 08/16/2018, 1:26 PM

## 2018-08-16 NOTE — Clinical Social Work Note (Signed)
[facility] declined bed offer but [facility] did offer. Patient and son have been notified. CSW left full bed offers list with patient in room. Patient and his son will discuss tonight and confirm plan. They will have a private room tomorrow if chosen. CSW faxed clinicals to [facility] for authorization review.  Dayton Scrape, [place]

## 2018-08-16 NOTE — Consult Note (Signed)
[facility] Nurse wound consult note Reason for Consult:Chronic venous insufficiency.  Wears Unna boots, changed weekly.  Current wraps have been in place 7 days.  Will change today.  Nonintact lesion to bilateral lateral leg near malleolus.  Resolving Wound type:venous insufficiency with stasis wounds Pressure Injury POA: NA Measurement:Left lateral lower leg near malleolus 0.5 cm scabbed lesion Right lateral leg near malleolus:  0.5 cm scabbed lesion Wound ZWC:HENI Drainage (amount, consistency, odor) none noted Periwound:chronic skin changes Dressing procedure/placement/frequency:Cleanse legs with soap and water and pat dry. Apply emollient cream.  Wrap with Unna boots, change weekly.  Will not follow at this time.  Please re-consult if needed.  Domenic Moras MSN, RN, FNP-BC CWON Wound, Ostomy, Continence Nurse Pager [phone]

## 2018-08-16 NOTE — Evaluation (Signed)
Physical Therapy Evaluation Patient Details Name: [name] MRN: [id number] DOB: August 30, 1923 Today's Date: 08/16/2018   History of Present Illness  Pt is 84 y/o male presenting with lightheadedness and new onset weakness. PMHx: HTN, HLD, Afib, OA of spine and hands, GERD, gout, tachybradycardia syndrome s/p pacemaker, CHF, chronic venous insufficiency, neuropathy, non-hodkin's lymphoma.   Clinical Impression  Pt pleasant on arrival, reporting he likes to be called "honey". Pt min assist for transfers and min guard for ambulation. Pt with report of dizziness and "woozy" upon sitting and during ambulation, orthostatics taken, VSS throughout session (see table below). Pt with decreased strength, balance, and coordination (see Pt problem list below). Pt will benefit acutely from skilled therapy to address above deficits to maximize functional mobility, independence, and safety to decrease fall risk. Recommending pt for sub-acute rehab at d/c as pt lives alone and was prior independent with AD.   Orthostatic VS for the past 24 hrs (Last 3 readings):  BP- Lying Pulse- Lying BP- Sitting Pulse- Sitting BP- Standing at 0 minutes Pulse- Standing at 0 minutes  08/16/18 0742 122/68 64 128/82 65 134/68 65  08/15/18 2156 126/59 65 127/69 62 103/62 64       Follow Up Recommendations SNF;Supervision/Assistance - 24 hour    Equipment Recommendations  None recommended by PT    Recommendations for Other Services       Precautions / Restrictions Precautions Precautions: Fall      Mobility  Bed Mobility Overal bed mobility: Needs Assistance Bed Mobility: Supine to Sit     Supine to sit: Min assist     General bed mobility comments: min assist to fully elevate trunk from surface. Pt sleeps in lift chair at home  Transfers Overall transfer level: Needs assistance   Transfers: Sit to/from Stand Sit to Stand: Min assist         General transfer comment: from low bed assist to rise,  minguard to sit to control descent. pt uses elevated surfaces at home  Ambulation/Gait Ambulation/Gait assistance: Min guard Gait Distance (Feet): 24 Feet Assistive device: Rolling walker (2 wheeled) Gait Pattern/deviations: Step-to pattern;Trunk flexed;Narrow base of support   Gait velocity interpretation: <1.31 ft/sec, indicative of household ambulator General Gait Details: pt leading with LLE with step to pattern, trunk flexion, decreased stride. Pt walked 16' then 24' after seated rest due to feeling lightheaded with VSS  Stairs            Wheelchair Mobility    Modified Rankin (Stroke Patients Only)       Balance Overall balance assessment: Needs assistance;History of Falls Sitting-balance support: Feet supported Sitting balance-Leahy Scale: Fair     Standing balance support: Bilateral upper extremity supported Standing balance-Leahy Scale: Poor Standing balance comment: biL UE support in standing                             Pertinent Vitals/Pain Pain Assessment: No/denies pain    Home Living Family/patient expects to be discharged to:: Private residence Living Arrangements: Alone Available Help at Discharge: Family;Available PRN/intermittently Type of Home: House Home Access: Stairs to enter;Ramped entrance Entrance Stairs-Rails: Right;Left;Can reach both Entrance Stairs-Number of Steps: 2 Home Layout: One level Home Equipment: Grab bars - toilet;Grab bars - tub/shower;Walker - 2 wheels;Walker - 4 wheels;Tub bench;Bedside commode;Shower seat      Prior Function Level of Independence: Independent with assistive device(s)         Comments: Uses RW  for functional mobility      Hand Dominance        Extremity/Trunk Assessment   Upper Extremity Assessment Upper Extremity Assessment: Generalized weakness    Lower Extremity Assessment Lower Extremity Assessment: Generalized weakness(pt with decreased sensation bil LE)    Cervical /  Trunk Assessment Cervical / Trunk Assessment: Kyphotic  Communication   Communication: HOH  Cognition Arousal/Alertness: Awake/alert Behavior During Therapy: WFL for tasks assessed/performed Overall Cognitive Status: Impaired/Different from baseline Area of Impairment: Problem solving                             Problem Solving: Slow processing        General Comments      Exercises     Assessment/Plan    PT Assessment Patient needs continued PT services  PT Problem List Decreased strength;Decreased mobility;Decreased safety awareness;Decreased activity tolerance;Decreased cognition;Decreased balance;Decreased knowledge of use of DME;Impaired sensation       PT Treatment Interventions Gait training;Therapeutic activities;Therapeutic exercise;Functional mobility training;DME instruction;Balance training;Patient/family education    PT Goals (Current goals can be found in the Care Plan section)  Acute Rehab PT Goals Patient Stated Goal: return home PT Goal Formulation: With patient/family Time For Goal Achievement: 08/16/18 Potential to Achieve Goals: Fair    Frequency Min 3X/week   Barriers to discharge Decreased caregiver support lives alone, niece comes in occassionaly to assist.     Co-evaluation               AM-PAC PT "6 Clicks" Daily Activity  Outcome Measure Difficulty turning over in bed (including adjusting bedclothes, sheets and blankets)?: A Lot Difficulty moving from lying on back to sitting on the side of the bed? : Unable Difficulty sitting down on and standing up from a chair with arms (e.g., wheelchair, bedside commode, etc,.)?: Unable Help needed moving to and from a bed to chair (including a wheelchair)?: A Little Help needed walking in hospital room?: A Little Help needed climbing 3-5 steps with a railing? : Total 6 Click Score: 11    End of Session Equipment Utilized During Treatment: Gait belt Activity Tolerance: Patient  tolerated treatment well;Patient limited by fatigue Patient left: in chair;with call bell/phone within reach;with chair alarm set Nurse Communication: Mobility status PT Visit Diagnosis: Other abnormalities of gait and mobility (R26.89);Muscle weakness (generalized) (M62.81);Unsteadiness on feet (R26.81);History of falling (Z91.81)    Time: 2122-4825 PT Time Calculation (min) (ACUTE ONLY): 31 min   Charges:   PT Evaluation $PT Eval Moderate Complexity: 1 Mod PT Treatments $Therapeutic Activity: 8-22 mins        Samuella Bruin, [place]  Acute Rehab [phone]   Samuella Bruin 08/16/2018, 8:50 AM

## 2018-08-16 NOTE — H&P (Signed)
History and Physical    KAYDENCE BABA GEX:[id number] DOB: 08-12-1923 DOA: 08/15/2018  PCP: Lavone Orn, MD Patient coming from: home  Chief Complaint: dizzines  HPI [name] is a 84 y.o. male with medical history significant of hypertension, hyperlipidemia, chronic atrial fibrillation, tachybradycardia syndrome status post permanent pacemaker, chronic diastolic congestive heart failure, chronic venous insufficiency presenting to the hospital for further evaluation of lightheadedness.  Patient states he woke up after nap this afternoon and did not feel good.  He then used a walker to go to the bathroom and felt very lightheaded and unsteady.  He felt weak and it was difficult for him to get back in a chair.  States his appetite has been low and his mouth feels dry.  He takes Lasix and metolazone for chronic diastolic congestive heart failure.  Dose of Lasix increased several months ago increased from 120 to 160. Patient takes metolazone if weight greater than 180 and recently has required this 2-3 times a week which is more than ususal. Of note. Recent weight 179 and patient took metolazone as it "was close" to 180. He continues to take his home potassium supplement.  Patient denies loss of consciousness during this episode and son at bedside confirmed.  Patient denies having any chest pain or shortness of breath when this happened.  Denies having any fevers.  States he felt nauseous this morning but did not vomit.  Currently not nauseous.  No recent diarrhea.  Son states that the patient lives on his own and another family member helps him fill his pillbox.  ED Course: Found to be hypokalemic with potassium 2.6 and normal mag.  Creatinine 1.3; baseline 1.1-1.2.  White count 11.4.  Troponin negative.  EKG with paced rhythm and A. fib.  UA not suggestive of infection.  Per ED provider, mild blood pressure drop when standing in the ED (systolic 010 to 272), no change in heart rate.  Head CT with  no acute intracranial abnormalities.  Patient received IV potassium 10 mEq once in the ED.  Review of Systems: As per HPI otherwise all other systems reviewed and are negative.   Ambulatory Status: ambulates with walker  Past Medical History:  Diagnosis Date  . Arrhythmia    afib  . Arthritis    "back and hands" (09/26/2015)  . Basal cell carcinoma   . CHF (congestive heart failure) ([place])   . Coronary artery disease   . CVI (common variable immunodeficiency) ([place])   . DJD (degenerative joint disease)   . ED (erectile dysfunction)   . GERD (gastroesophageal reflux disease)   . Gout   . Heart attack ([place]) 1986  . Hyperlipidemia   . Hypertension   . IFG (impaired fasting glucose)   . Neuropathy   . Non Hodgkin's lymphoma ([place]) dx'd 2006  . PAC (premature atrial contraction)   . PE (pulmonary embolism) 2007   "when I was taking chemo"  . Peptic ulcer disease   . Pneumonia ~ 2007 X 2  . Squamous carcinoma   . Venous stasis ulcers (H[facility])     Past Surgical History:  Procedure Laterality Date  . BACK SURGERY    . CARDIAC CATHETERIZATION  1990  . CATARACT EXTRACTION W/ INTRAOCULAR LENS  IMPLANT, BILATERAL Bilateral 10/2006 ~ 11/2006  . CORONARY ANGIOPLASTY    . CORONARY ARTERY BYPASS GRAFT  1990   CABG X3  . ELBOW SURGERY Left    "related to gout"  . INSERT / REPLACE / REMOVE  PACEMAKER  08/2010  . KNEE ARTHROSCOPY Left   . LUMBAR DISC SURGERY     "had pinched nerve; had to make room for it"  . NASAL SINUS SURGERY  1980   "related to bacteria infection in sinus"  . SQUAMOUS CELL CARCINOMA EXCISION Right 09/12/2015   cheek  . TONSILLECTOMY  1972    Social History   Socioeconomic History  . Marital status: Widowed    Spouse name: Not on file  . Number of children: Not on file  . Years of education: Not on file  . Highest education level: Not on file  Occupational History  . Not on file  Social Needs  . Financial resource strain: Not on file  . Food insecurity:     Worry: Not on file    Inability: Not on file  . Transportation needs:    Medical: Not on file    Non-medical: Not on file  Tobacco Use  . Smoking status: Former Smoker    Packs/day: 1.00    Years: 1.00    Pack years: 1.00    Types: Cigarettes  . Smokeless tobacco: Never Used  Substance and Sexual Activity  . Alcohol use: No  . Drug use: No  . Sexual activity: Never  Lifestyle  . Physical activity:    Days per week: Not on file    Minutes per session: Not on file  . Stress: Not on file  Relationships  . Social connections:    Talks on phone: Not on file    Gets together: Not on file    Attends religious service: Not on file    Active member of club or organization: Not on file    Attends meetings of clubs or organizations: Not on file    Relationship status: Not on file  . Intimate partner violence:    Fear of current or ex partner: Not on file    Emotionally abused: Not on file    Physically abused: Not on file    Forced sexual activity: Not on file  Other Topics Concern  . Not on file  Social History Narrative  . Not on file    Allergies  Allergen Reactions  . Avelox [Moxifloxacin Hcl In Nacl] Other (See Comments)    Messed up lungs, Respiratory Distress   . Indomethacin Other (See Comments)    Renal insufficiency  . Levaquin [Levofloxacin] Other (See Comments)    Messed up lungs  . Lipitor [Atorvastatin] Nausea Only, Palpitations and Other (See Comments)    Irregular heart beat also  . Morphine And Related Other (See Comments)    Irregular heart beart  . Pravachol [Pravastatin Sodium] Nausea Only, Palpitations and Other (See Comments)    Irregular heart beat  . Aleve [Naproxen Sodium] Nausea And Vomiting and Other (See Comments)    GI Upset  . Clindamycin/Lincomycin Other (See Comments)    Severe acid reflux and stomach pain  . Norpace [Disopyramide Phosphate] Other (See Comments)    Urinary retention  . Tripelennamine   . Amlodipine Besylate Rash  .  Azithromycin Rash  . Bactrim [Sulfamethoxazole-Trimethoprim] Rash  . Clinoril [Sulindac] Rash  . Codeine Other (See Comments)    unknown  . Digoxin Rash  . Lorazepam Other (See Comments)    unknown  . Oxycodone-Acetaminophen Rash  . Sulfonamide Derivatives Rash  . Uloric [Febuxostat] Other (See Comments)    unknown    Family History  Problem Relation Age of Onset  . Obesity Mother   .  Lung disease Father   . Diabetes type II Sister   . Heart disease Brother   . Diabetes type II Daughter     Prior to Admission medications   Medication Sig Start Date End Date Taking? Authorizing Provider  acetaminophen (TYLENOL) 500 MG tablet Take 500 mg by mouth at bedtime.    Yes [provider]  albuterol (PROVENTIL) (2.5 MG/3ML) 0.083% nebulizer solution Take 3 mLs (2.5 mg total) by nebulization every 2 (two) hours as needed for wheezing or shortness of breath. 03/04/18  Yes Hosie Poisson, MD  allopurinol (ZYLOPRIM) 300 MG tablet Take 300 mg by mouth daily.    Yes [provider]  ALPRAZolam Duanne Moron) 0.5 MG tablet Take 1 tablet (0.5 mg total) by mouth at bedtime as needed for anxiety. For sleep 03/23/17  Yes Geradine Girt, DO  aspirin 81 MG tablet Take 1 tablet (81 mg total) by mouth every other day. 04/07/18  Yes Emokpae, Courage, MD  atenolol (TENORMIN) 50 MG tablet Take 1 tablet (50 mg total) by mouth daily. 04/07/18  Yes Emokpae, Courage, MD  cholecalciferol 4000 units TABS Take 4,000 Units by mouth daily. 04/08/18  Yes Emokpae, Courage, MD  clotrimazole-betamethasone (LOTRISONE) cream Apply 1 application topically 2 (two) times daily as needed (rash).  08/11/17  Yes [provider]  colchicine 0.6 MG tablet Take 0.6 mg by mouth daily as needed (gout).    Yes [provider]  furosemide (LASIX) 80 MG tablet Take 80 mg by mouth 2 (two) times daily.   Yes [provider]  gabapentin (NEURONTIN) 100 MG capsule Take 100 mg by mouth at bedtime as needed  (nerve pain).    Yes [provider]  ipratropium-albuterol (DUONEB) 0.5-2.5 (3) MG/3ML SOLN Take 3 mLs by nebulization every 6 (six) hours as needed. 03/04/18  Yes Hosie Poisson, MD  metolazone (ZAROXOLYN) 2.5 MG tablet Take 2.5 mg by mouth daily as needed (if weight is over 180 pounds).   Yes [provider]  miconazole (MICOTIN) 2 % powder Apply topically See admin instructions. Apply topically daily after bath - for rash   Yes [provider]  mometasone-formoterol (DULERA) 200-5 MCG/ACT AERO Inhale 2 puffs into the lungs 2 (two) times daily. 03/04/18  Yes Hosie Poisson, MD  nitroGLYCERIN (NITROSTAT) 0.4 MG SL tablet Place 0.4 mg under the tongue every 5 (five) minutes as needed for chest pain.   Yes [provider]  nystatin (MYCOSTATIN) 100000 UNIT/ML suspension Take 5 mLs by mouth 2 (two) times daily. Swish and swallow 03/08/18  Yes [provider]  potassium chloride SA (K-DUR,KLOR-CON) 20 MEQ tablet Take 40 mEq by mouth 2 (two) times daily.    Yes [provider]  senna-docusate (SENOKOT-S) 8.6-50 MG tablet Take 2 tablets by mouth at bedtime. 04/07/18  Yes Roxan Hockey, MD  warfarin (COUMADIN) 2.5 MG tablet Take 1 tablet (2.5 mg total) by mouth daily at 6 PM. Or as directed Patient taking differently: Take 1.25-2.5 mg by mouth See admin instructions. Alternate taking 1 tablet one day and 1/2 tablet the next day 03/05/18  Yes Hosie Poisson, MD  benzonatate (TESSALON) 200 MG capsule Take 1 capsule (200 mg total) by mouth 2 (two) times daily as needed for cough. Patient not taking: Reported on 08/15/2018 03/04/18   Hosie Poisson, MD  feeding supplement, ENSURE ENLIVE, (ENSURE ENLIVE) LIQD Take 237 mLs by mouth 2 (two) times daily between meals. 04/07/18   Roxan Hockey, MD  furosemide (LASIX) 40 MG tablet  80 mg every AM and 40 mg every PM Patient not taking: Reported on 08/15/2018 04/07/18   Roxan Hockey, MD  guaiFENesin (MUCINEX) 600 MG 12  hr tablet Take 1 tablet (600 mg total) by mouth 2 (two) times daily. Patient not taking: Reported on 08/15/2018 04/07/18   Roxan Hockey, MD  predniSONE (DELTASONE) 20 MG tablet Take 1 tablet (20 mg total) by mouth daily with breakfast. Patient not taking: Reported on 08/15/2018 04/08/18   Roxan Hockey, MD    Physical Exam: Vitals:   08/16/18 0758 08/16/18 0851 08/16/18 0929 08/16/18 1407  BP: 134/73 (!) 118/58  (!) 116/51  Pulse: 64 65  (!) 58  Resp:    18  Temp:    97.6 F (36.4 C)  TempSrc:    Oral  SpO2:   98% 95%  Weight:      Height:         General:  Appears calm and comfortable sitting in chair eating breakfast Eyes:  PERRL, EOMI, normal lids, iris ENT:  grossly normal hearing, lips & tongue, poor dentition Neck:  no LAD, masses or thyromegaly Cardiovascular:  RRR, no m/r/g. Bilateral LE edema 1-2+.  Respiratory:  CTA bilaterally, no w/r/r. Normal respiratory effort. Abdomen:  soft, ntnd, +BS no guarding or rebounding Skin:  LE with compression stockings on Musculoskeletal:  grossly normal tone BUE/BLE, good ROM, no bony abnormality Psychiatric:  grossly normal mood and affect, speech fluent and appropriate, AOx3 Neurologic:  CN 2-12 grossly intact, moves all extremities in coordinated fashion, sensation intact  Labs on Admission: I have personally reviewed following labs and imaging studies  CBC: Recent Labs  Lab 08/15/18 2115 08/16/18 0543  WBC 11.4* 9.2  HGB 14.8 13.4  HCT 44.8 40.7  MCV 100.9* 100.5*  PLT 234 094   Basic Metabolic Panel: Recent Labs  Lab 08/15/18 2115 08/15/18 2314 08/16/18 0543 08/16/18 1045  NA 133*  --  137  --   K 2.6*  --  2.4* 3.2*  CL 90*  --  96*  --   CO2 30  --  31  --   GLUCOSE 111*  --  102*  --   BUN 36*  --  30*  --   CREATININE 1.39*  --  1.16  --   CALCIUM 9.8  --  8.7*  --   MG  --  1.8  --   --    GFR: Estimated Creatinine Clearance: 34.6 mL/min (by C-G formula based on SCr of 1.16 mg/dL). Liver  Function Tests: No results for input(s): AST, ALT, ALKPHOS, BILITOT, PROT, ALBUMIN in the last 168 hours. No results for input(s): LIPASE, AMYLASE in the last 168 hours. No results for input(s): AMMONIA in the last 168 hours. Coagulation Profile: Recent Labs  Lab 08/15/18 2115 08/16/18 0543  INR 2.54 2.75   Cardiac Enzymes: Recent Labs  Lab 08/15/18 2115  TROPONINI <0.03   BNP (last 3 results) No results for input(s): PROBNP in the last 8760 hours. HbA1C: No results for input(s): HGBA1C in the last 72 hours. CBG: Recent Labs  Lab 08/15/18 2111  GLUCAP 106*   Lipid Profile: No results for input(s): CHOL, HDL, LDLCALC, TRIG, CHOLHDL, LDLDIRECT in the last 72 hours. Thyroid Function Tests: No results for input(s): TSH, T4TOTAL, FREET4, T3FREE, THYROIDAB in the last 72 hours. Anemia Panel: No results for input(s): VITAMINB12, FOLATE, FERRITIN, TIBC, IRON, RETICCTPCT in the last 72 hours. Urine analysis:    Component Value Date/Time   COLORURINE STRAW (  A) 08/15/2018 2128   APPEARANCEUR CLEAR 08/15/2018 2128   LABSPEC 1.006 08/15/2018 2128   PHURINE 7.0 08/15/2018 2128   GLUCOSEU NEGATIVE 08/15/2018 2128   HGBUR SMALL (A) 08/15/2018 2128   BILIRUBINUR NEGATIVE 08/15/2018 2128   KETONESUR NEGATIVE 08/15/2018 2128   PROTEINUR NEGATIVE 08/15/2018 2128   UROBILINOGEN 0.2 07/08/2015 0428   NITRITE NEGATIVE 08/15/2018 2128   LEUKOCYTESUR NEGATIVE 08/15/2018 2128    Creatinine Clearance: Estimated Creatinine Clearance: 34.6 mL/min (by C-G formula based on SCr of 1.16 mg/dL).  Sepsis Labs: @LABRCNTIP (procalcitonin:4,lacticidven:4) )No results found for this or any previous visit (from the past 240 hour(s)).   Radiological Exams on Admission: Ct Head Wo Contrast  Result Date: 08/15/2018 CLINICAL DATA:  Sudden onset generalized weakness at noon today. Blurry vision when standing. Nausea and headache. EXAM: CT HEAD WITHOUT CONTRAST TECHNIQUE: Contiguous axial images were  obtained from the base of the skull through the vertex without intravenous contrast. COMPARISON:  03/21/2017 FINDINGS: Brain: Diffuse cerebral atrophy. Ventricular dilatation consistent with central atrophy. Low-attenuation changes throughout the deep white matter consistent with small vessel ischemic changes and lacunar infarcts. No mass effect or midline shift. No abnormal extra-axial fluid collections. Gray-white matter junctions are distinct. Basal cisterns are not effaced. No acute intracranial hemorrhage. Vascular: Moderate intracranial arterial calcifications are present. Skull: Calvarium appears intact. Sinuses/Orbits: Old fracture deformity of the left medial orbital wall. No significant opacification of the paranasal sinuses or mastoid air cells. Other: None. IMPRESSION: No acute intracranial abnormalities. Chronic atrophy and small vessel ischemic changes. Electronically Signed   By: Lucienne Capers M.D.   On: 08/15/2018 22:42    EKG: Independently reviewed afib a flutter.   Assessment/Plan Principal Problem:   Hypokalemia Active Problems:   Venous stasis dermatitis of both lower extremities   Chronic diastolic CHF (congestive heart failure) (HCC)   Orthostatic hypotension   Atrial fibrillation (HCC)   Leukocytosis   Lightheadedness   Tachy-brady syndrome (HCC)   Neuropathy   Gout   Hypokalemia. Likely related to diuretic and decreased oral intake. Lasix increased in May to 160mg . Patient with persistent hypokalemia requiring IV repletion. Magnesium low end of normal.  Potassium up to 3.2 from 2.6. He received 4 runs potassium IV and 94meq po.   EKG paced. -will provide 2 more runs of potassium IV -will provide 2gm magnesium IV -hold lasix and zaroxalyn -BMP in a.m.  Orthostatic hypotension: In the setting of diuretic use at home including Lasix and metolazone . Lasix dose increased in may as noted above. He takes metolazone if weight greater than 180 and he has required this  prn dose more frequently of late. In addition he noted he took dose when weigh 179. ACS less likely as troponin negative. EKG with paced rhythm and A. fib.  PE less likely as patient is not hypoxemic. Per ED provider, mild blood pressure drop when standing in the ED (systolic 423 to 536). Head CT with no acute intracranial abnormalities.   -He has chronic lower extremity edema. Lungs clear on exam.  he recieved bolus of 1 L normal saline and maintenance fluids for 10 hours. - Repeat orthostatics in the morning. -Hold home Lasix and metolazone -Creatinine 1.3; baseline 1.1-1.2. -at discharge may need to consider changing parameters for prn metolazone  Mild leukocytosis: likely reactive. resolved.  Patient is afebrile.  UA not suggestive of infection.  Lungs clear on exam. -Repeat CBC in a.m.  Chronic atrial fibrillation EKG with paced rhythm (heart rate 62) and A. Fib.  CHA2DS2Vasc 4.  -Continue home warfarin  Tachybradycardia syndrome -Pacemaker interrogated on October 1.  Normal function.  Hypertension  -see above -Continue home atenolol  Chronic diastolic congestive heart failure Currently orthostatic and appears dry.  Lungs clear on exam.  Lower extremity edema is chronic. -Hold home Lasix and metolazone -Continue home atenolol  Chronic venous insufficiency, venous stasis ulcers/ dermatitis  -Wound care.  Keep legs wrapped in bandaged.  Keep legs elevated.  Gout -Continue home allopurinol  Neuropathy -Continue home gabapentin  COPD -Continue home Dulera -Continue home albuterol, ipratropium as needed  Deconditioning -PT consult        DVT prophylaxis: coumadin  Code Status: dnr  Family Communication: son at bedside  Disposition Plan: may need snf Consults called: none  Admission status: inpatient    Radene Gunning MD [facility]  If 7PM-7AM, please contact night-coverage [email or web address] Password [email or web address]  08/16/2018, 3:52 PM

## 2018-08-16 NOTE — Clinical Social Work Placement (Signed)
   CLINICAL SOCIAL WORK PLACEMENT  NOTE  Date:  08/16/2018  Patient Details  Name: [name] MRN: [id number] Date of Birth: October 09, 1923  Clinical Social Work is seeking post-discharge placement for this patient at the [place] level of care (*CSW will initial, date and re-position this form in  chart as items are completed):  Yes   Patient/family provided with [place] Work Department's list of facilities offering this level of care within the geographic area requested by the patient (or if unable, by the patient's family).  Yes   Patient/family informed of their freedom to choose among providers that offer the needed level of care, that participate in Medicare, Medicaid or managed care program needed by the patient, have an available bed and are willing to accept the patient.  Yes   Patient/family informed of 's ownership interest in [facility] and [facility], as well as of the fact that they are under no obligation to receive care at these facilities.  PASRR submitted to EDS on 08/16/18     PASRR number received on       Existing PASRR number confirmed on 08/16/18     FL2 transmitted to all facilities in geographic area requested by pt/family on 08/16/18     FL2 transmitted to all facilities within larger geographic area on       Patient informed that his/her managed care company has contracts with or will negotiate with certain facilities, including the following:            Patient/family informed of bed offers received.  Patient chooses bed at       Physician recommends and patient chooses bed at      Patient to be transferred to   on  .  Patient to be transferred to facility by       Patient family notified on   of transfer.  Name of family member notified:        PHYSICIAN Please sign FL2, Please sign DNR     Additional Comment:    _______________________________________________ Candie Chroman,  LCSW 08/16/2018, 1:31 PM

## 2018-08-16 NOTE — Progress Notes (Signed)
Orthopedic Tech Progress Note Patient Details:  [name] 1923/07/01 [id number]  Ortho Devices Type of Ortho Device: Haematologist Ortho Device/Splint Location: bilateral Ortho Device/Splint Interventions: Application   Post Interventions Patient Tolerated: Well Instructions Provided: Care of device   [name] 08/16/2018, 1:29 PM

## 2018-08-16 NOTE — Progress Notes (Signed)
Patient's legs cleansed with soap and water and emollient cream applied, waiting for ortho tech to come wrap them in unna boots.

## 2018-08-17 DIAGNOSIS — I4811 Longstanding persistent atrial fibrillation: Secondary | ICD-10-CM

## 2018-08-17 DIAGNOSIS — I5032 Chronic diastolic (congestive) heart failure: Secondary | ICD-10-CM

## 2018-08-17 LAB — CBC
HCT: 41.6 % (ref 39.0–52.0)
HEMOGLOBIN: 13.5 g/dL (ref 13.0–17.0)
MCH: 32.6 pg (ref 26.0–34.0)
MCHC: 32.5 g/dL (ref 30.0–36.0)
MCV: 100.5 fL — ABNORMAL HIGH (ref 80.0–100.0)
NRBC: 0 % (ref 0.0–0.2)
PLATELETS: 222 10*3/uL (ref 150–400)
RBC: 4.14 MIL/uL — AB (ref 4.22–5.81)
RDW: 14.5 % (ref 11.5–15.5)
WBC: 10 10*3/uL (ref 4.0–10.5)

## 2018-08-17 LAB — BASIC METABOLIC PANEL
Anion gap: 9 (ref 5–15)
BUN: 29 mg/dL — AB (ref 8–23)
CHLORIDE: 98 mmol/L (ref 98–111)
CO2: 29 mmol/L (ref 22–32)
Calcium: 9.1 mg/dL (ref 8.9–10.3)
Creatinine, Ser: 1.15 mg/dL (ref 0.61–1.24)
GFR, EST NON AFRICAN AMERICAN: 53 mL/min — AB (ref >60)
Glucose, Bld: 98 mg/dL (ref 70–99)
POTASSIUM: 2.8 mmol/L — AB (ref 3.5–5.1)
Sodium: 136 mmol/L (ref 135–145)

## 2018-08-17 LAB — MAGNESIUM: Magnesium: 2.2 mg/dL (ref 1.7–2.4)

## 2018-08-17 LAB — PROTIME-INR
INR: 2.81
PROTHROMBIN TIME: 29.4 s — AB (ref 11.4–15.2)

## 2018-08-17 LAB — POTASSIUM: POTASSIUM: 3.2 mmol/L — AB (ref 3.5–5.1)

## 2018-08-17 MED ORDER — POTASSIUM CHLORIDE CRYS ER 20 MEQ PO TBCR: 40.0000 meq | EXTENDED_RELEASE_TABLET | Freq: Two times a day (BID) | ORAL | Status: DC

## 2018-08-17 MED ORDER — POTASSIUM CHLORIDE CRYS ER 20 MEQ PO TBCR
40.0000 meq | EXTENDED_RELEASE_TABLET | ORAL | Status: AC
  Administered 2018-08-17 (×2): 40 meq via ORAL
  Filled 2018-08-17 (×2): qty 2

## 2018-08-17 MED ORDER — ACETAMINOPHEN 325 MG PO TABS
650.0000 mg | ORAL_TABLET | Freq: Four times a day (QID) | ORAL | Status: DC | PRN
  Administered 2018-08-17 – 2018-08-18 (×2): 650 mg via ORAL
  Filled 2018-08-17 (×2): qty 2

## 2018-08-17 MED ORDER — ALPRAZOLAM 0.5 MG PO TABS: 0.5000 mg | ORAL_TABLET | Freq: Every evening | ORAL | 0 refills | Status: AC | PRN

## 2018-08-17 MED ORDER — WARFARIN SODIUM 2.5 MG PO TABS
1.2500 mg | ORAL_TABLET | Freq: Once | ORAL | Status: AC
  Administered 2018-08-17: 1.25 mg via ORAL
  Filled 2018-08-17: qty 0.5

## 2018-08-17 MED ORDER — POTASSIUM CHLORIDE CRYS ER 20 MEQ PO TBCR
40.0000 meq | EXTENDED_RELEASE_TABLET | Freq: Once | ORAL | Status: AC
  Administered 2018-08-17: 40 meq via ORAL
  Filled 2018-08-17: qty 2

## 2018-08-17 NOTE — Evaluation (Signed)
Occupational Therapy Evaluation Patient Details Name: [name] MRN: [id number] DOB: August 31, 1923 Today's Date: 08/17/2018    History of Present Illness Pt is 84 y/o male presenting with lightheadedness and new onset weakness. PMHx: HTN, HLD, Afib, OA of spine and hands, GERD, gout, tachybradycardia syndrome s/p pacemaker, CHF, chronic venous insufficiency, neuropathy, non-hodkin's lymphoma.    Clinical Impression   Pt admitted with the above diagnoses and presents with below problem list. Pt will benefit from continued acute OT to address the below listed deficits and maximize independence with basic ADLs prior to d/c to venue below. PTA pt was mod I with ADLs, lives alone. Pt currently min to mod A with most ADLs and limited functional mobility (pivotal steps from recliner to EOB). Pt limited by dizziness and generalized weakness this session. Son present during eval,      Follow Up Recommendations  SNF    Equipment Recommendations  Other (comment)(defer to next venue)    Recommendations for Other Services       Precautions / Restrictions Precautions Precautions: Fall Restrictions Weight Bearing Restrictions: No      Mobility Bed Mobility Overal bed mobility: Needs Assistance Bed Mobility: Sit to Supine       Sit to supine: Min assist   General bed mobility comments: assist to advance BLE onto bed  Transfers Overall transfer level: Needs assistance Equipment used: Rolling walker (2 wheeled) Transfers: Sit to/from Stand Sit to Stand: Min assist         General transfer comment: from recliner to EOB    Balance Overall balance assessment: Needs assistance;History of Falls Sitting-balance support: Feet supported Sitting balance-Leahy Scale: Fair     Standing balance support: Bilateral upper extremity supported Standing balance-Leahy Scale: Poor Standing balance comment: biL UE support in standing                           ADL either  performed or assessed with clinical judgement   ADL Overall ADL's : Needs assistance/impaired Eating/Feeding: Set up;Sitting   Grooming: Minimal assistance;Sitting   Upper Body Bathing: Sitting;Moderate assistance   Lower Body Bathing: Moderate assistance;Sit to/from stand   Upper Body Dressing : Moderate assistance;Sitting   Lower Body Dressing: Moderate assistance;Sit to/from stand   Toilet Transfer: Minimal assistance;RW;Ambulation Armed forces technical officer Details (indicate cue type and reason): pivotal steps from recliner to [place] and Hygiene: Moderate assistance;Sit to/from stand   Tub/ Shower Transfer: Ambulation;Minimal assistance;3 in 1;Rolling walker;Stand-pivot   Functional mobility during ADLs: Minimal assistance;Rolling walker(pivotal steps on eval due to dizziness and weakness) General ADL Comments: Pt completed pivotal steps from recliner to EOB and bed mobility.      Vision         Perception     Praxis      Pertinent Vitals/Pain Pain Assessment: Faces Faces Pain Scale: Hurts a little bit Pain Location: generalized, with movement Pain Descriptors / Indicators: Grimacing Pain Intervention(s): Monitored during session     Hand Dominance Right   Extremity/Trunk Assessment Upper Extremity Assessment Upper Extremity Assessment: Generalized weakness(edema noted throughout BUE)   Lower Extremity Assessment Lower Extremity Assessment: Defer to PT evaluation   Cervical / Trunk Assessment Cervical / Trunk Assessment: Kyphotic   Communication Communication Communication: HOH   Cognition Arousal/Alertness: Awake/alert Behavior During Therapy: WFL for tasks assessed/performed Overall Cognitive Status: Impaired/Different from baseline Area of Impairment: Problem solving  Problem Solving: Slow processing     General Comments       Exercises     Shoulder Instructions      Home Living  Family/patient expects to be discharged to:: Private residence Living Arrangements: Alone Available Help at Discharge: Family;Available PRN/intermittently Type of Home: House Home Access: Stairs to enter;Ramped entrance Entrance Stairs-Number of Steps: 2 Entrance Stairs-Rails: Right;Left;Can reach both Home Layout: One level     Bathroom Shower/Tub: Teacher, early years/pre: Handicapped height     Home Equipment: Grab bars - toilet;Grab bars - tub/shower;Walker - 2 wheels;Walker - 4 wheels;Tub bench;Bedside commode;Shower seat          Prior Functioning/Environment Level of Independence: Independent with assistive device(s)        Comments: Uses RW for functional mobility         OT Problem List: Decreased strength;Decreased activity tolerance;Impaired balance (sitting and/or standing);Decreased cognition;Decreased knowledge of use of DME or AE;Decreased knowledge of precautions;Cardiopulmonary status limiting activity;Increased edema;Pain      OT Treatment/Interventions: Self-care/ADL training;Therapeutic exercise;Energy conservation;DME and/or AE instruction;Therapeutic activities;Patient/family education;Balance training    OT Goals(Current goals can be found in the care plan section) Acute Rehab OT Goals Patient Stated Goal: rehab then home OT Goal Formulation: With patient/family Time For Goal Achievement: 08/31/18 Potential to Achieve Goals: Good ADL Goals Pt Will Perform Upper Body Bathing: with min assist;sitting Pt Will Perform Lower Body Bathing: with min assist;sit to/from stand Pt Will Perform Upper Body Dressing: with set-up;sitting Pt Will Perform Lower Body Dressing: with min assist;sit to/from stand Pt Will Transfer to Toilet: with min guard assist;ambulating Pt Will Perform Toileting - Clothing Manipulation and hygiene: with min guard assist;sit to/from stand Additional ADL Goal #1: Pt will complete bed mobility at min guard level to prepare  for OOB ADLs.  OT Frequency: Min 2X/week   Barriers to D/C:            Co-evaluation              AM-PAC PT "6 Clicks" Daily Activity     Outcome Measure Help from another person eating meals?: A Little Help from another person taking care of personal grooming?: A Little Help from another person toileting, which includes using toliet, bedpan, or urinal?: A Lot Help from another person bathing (including washing, rinsing, drying)?: A Lot Help from another person to put on and taking off regular upper body clothing?: A Little Help from another person to put on and taking off regular lower body clothing?: A Lot 6 Click Score: 15   End of Session Equipment Utilized During Treatment: Gait belt;Rolling walker  Activity Tolerance: Other (comment)(dizzy and reporting generalized weakness) Patient left: in bed;with call bell/phone within reach;with bed alarm set;with family/visitor present  OT Visit Diagnosis: Unsteadiness on feet (R26.81);Muscle weakness (generalized) (M62.81);Pain                Time: 5883-2549 OT Time Calculation (min): 13 min Charges:  OT General Charges $OT Visit: 1 Visit OT Evaluation $OT Eval Low Complexity: [place], OT Acute Rehabilitation Services Pager: [phone] Office: [phone]   Hortencia Pilar 08/17/2018, 1:41 PM

## 2018-08-17 NOTE — Progress Notes (Signed)
[facility] for Coumadin Indication: atrial fibrillation  Patient Measurements: Height: 5' (152.4 cm) Weight: 178 lb (80.7 kg)(scale a) IBW/kg (Calculated) : 50  Vital Signs: Temp: 97.5 F (36.4 C) (10/09 0415) Temp Source: Oral (10/09 0415) BP: 122/68 (10/09 0415) Pulse Rate: 66 (10/09 0415)  Labs: Recent Labs    08/15/18 2115 08/16/18 0543 08/17/18 0428  HGB 14.8 13.4 13.5  HCT 44.8 40.7 41.6  PLT 234 207 222  LABPROT 27.2* 28.9* 29.4*  INR 2.54 2.75 2.81  CREATININE 1.39* 1.16 1.15  TROPONINI <0.03  --   --      Medical History: Past Medical History:  Diagnosis Date  . Arrhythmia    afib  . Arthritis    "back and hands" (09/26/2015)  . Basal cell carcinoma   . CHF (congestive heart failure) ([place])   . Coronary artery disease   . CVI (common variable immunodeficiency) ([place])   . DJD (degenerative joint disease)   . ED (erectile dysfunction)   . GERD (gastroesophageal reflux disease)   . Gout   . Heart attack ([place]) 1986  . Hyperlipidemia   . Hypertension   . IFG (impaired fasting glucose)   . Neuropathy   . Non Hodgkin's lymphoma ([place]) dx'd 2006  . PAC (premature atrial contraction)   . PE (pulmonary embolism) 2007   "when I was taking chemo"  . Peptic ulcer disease   . Pneumonia ~ 2007 X 2  . Squamous carcinoma   . Venous stasis ulcers ([facility])     Assessment: 84 yo male complained of generalized weakness. Patient is on warfarin prior to admission for history of PE. INR up to 2.8 today, still therapeutic.  PTA warfarin regimen: 2.5 mg and 1.25 mg on alternating days  Goal of Therapy:  INR 2-3   Plan:  -warfarin 1.25 mg po x1 -daily INR   Harvel Quale 08/17/2018 10:00 AM

## 2018-08-17 NOTE — Clinical Social Work Note (Addendum)
Patient's sons have concerns over [facility] scores. They would like to review other options. CSW provided son, [name], with bed offers and emailed him SNF list to review scores.  Dayton Scrape, [place] [phone]  10:01 am Son called and said they would like to check on [facility] in [place]. This facility has offered a bed and stated all of their rehab beds are private. Patient's son would like to accept. CSW called [facility]. Authorization is still pending but assigned authorization to this facility.  Dayton Scrape, [place] [phone]  2:01 pm Faxed OT evaluation to [facility] for authorization review.  Dayton Scrape, [place] [phone]  3:54 pm Insurance authorization still pending. Preservice coordinator said if we do not have authorization by late this afternoon, will will likely have decision by tomorrow morning. Son Anza aware.  Dayton Scrape, [place]

## 2018-08-17 NOTE — Progress Notes (Signed)
[facility]                                                                              Patient Demographics  [name], is a 84 y.o. male, DOB - 06/07/1923, [id number]  Admit date - 08/15/2018   Admitting Physician Shela Leff, MD  Outpatient Primary MD for the patient is Lavone Orn, MD  Outpatient specialists:   LOS - 1  days   Medical records reviewed and are as summarized below:    Chief Complaint  Patient presents with  . Weakness       Brief summary   Patient is a 84 year old male with hypertension, hyperlipidemia, chronic A. fib, tachybradycardia syndrome status post permanent pacemaker, chronic diastolic CHF, chronic venous insufficiency presented to ED with lightheadedness.Patient states he woke up after nap this afternoon and did not feel good. He then used awalker to go to the bathroom and felt very lightheaded and unsteady. He felt weak and it was difficult for him to get back in a chair. States his appetite has been low andhis mouth feels dry. He takes Lasix and metolazone for chronic diastolic congestive heart failure. Dose of Lasix increased several months ago increased from 120 to 160.  Patient takes metolazone if weight greater than 180 and had recently required 2-3 times a week which was more than his usual.  Recent weight was 179 and patient took the metolazone as it was close to 180.  He denied any loss of consciousness. At the time of admission, hypokalemic with potassium 2.6, creatinine 1.3, white count 11.4.   Assessment & Plan    Principal Problem:   Hypokalemia -Likely related to diuretics and decreased oral intake.  Lasix was increased in 5/20 19 to 160 mg daily and patient has taken metolazone multiple times this past week. -Potassium still low today 2.8.  Continue to hold diuretics -Monitor I's and O's closely, negative balance of 759 cc. -Weight 178.  Will discontinue metolazone at the time of discharge, may  restart Lasix at the time of discharge, may need lower dose and close follow-up with cardiology to adjust doses -Potassium 2.8 today, will supplement potassium orally today, recheck potassium at 1 PM,  magnesium within normal limits  Orthostatic hypotension -Currently stable, in the setting of increased Lasix dose and metolazone, had taken his as needed dose more frequently.  -Will need to follow-up with cardiology regarding instructions with the as needed dosing of metolazone or may need sliding scale.  For now will hold diuretics   Mild leukocytosis Likely reactive, afebrile, no source of infection Repeat WBC count normal  Chronic atrial fibrillation -Rate controlled, continue Coumadin per pharmacy, INR therapeutic - [place] VASC 4  Tachybradycardia syndrome Pacemaker interrogated on 10/1, normal function  Essential hypertension Continue atenolol  Chronic diastolic CHF Appears to be dry at the time of admission, no JVD, lungs clear, lower extremity edema chronic Currently holding Lasix and metolazone   Chronic venous insufficiency Continue wound care, Unna boots  Gout Continue allopurinol   Code Status: DNR DVT Prophylaxis:  warfarin Family Communication: Discussed in detail with the patient, all imaging results, lab results explained  to the patient    Disposition Plan: Awaiting skilled nursing facility authorization and potassium normalized  Time Spent in minutes   35 minutes  Procedures:  None  Consultants:   None  Antimicrobials:      Medications  Scheduled Meds: . allopurinol  300 mg Oral Daily  . aspirin  81 mg Oral QODAY  . atenolol  50 mg Oral Daily  . cholecalciferol  4,000 Units Oral Daily  . feeding supplement (ENSURE ENLIVE)  237 mL Oral BID BM  . mometasone-formoterol  2 puff Inhalation BID  . potassium chloride  40 mEq Oral Q4H  . senna-docusate  2 tablet Oral QHS  . warfarin  1.25 mg Oral ONCE-1800  . Warfarin - Pharmacist Dosing  Inpatient   Does not apply q1800   Continuous Infusions: PRN Meds:.gabapentin, ipratropium-albuterol   Antibiotics   Anti-infectives (From admission, onward)   None        Subjective:   [name] was seen and examined today.  Feeling better, no chest pain or shortness of breath.  No acute issues overnight.  No fevers. Patient denies dizziness, abdominal pain, N/V/D/C, new weakness, numbess, tingling. No acute events overnight.    Objective:   Vitals:   08/16/18 2005 08/17/18 0415 08/17/18 0719 08/17/18 1203  BP:  122/68  137/62  Pulse:  66  63  Resp:  18  20  Temp:  (!) 97.5 F (36.4 C)  98 F (36.7 C)  TempSrc:  Oral  Oral  SpO2: 92% 96% 93% 98%  Weight:  80.7 kg    Height:        Intake/Output Summary (Last 24 hours) at 08/17/2018 1209 Last data filed at 08/17/2018 0900 Gross per 24 hour  Intake 1998.32 ml  Output 1750 ml  Net 248.32 ml     Wt Readings from Last 3 Encounters:  08/17/18 80.7 kg  08/09/18 85.3 kg  04/07/18 77.6 kg     Exam  General: Alert and oriented x 3, NAD  Eyes:  HEENT:  Atraumatic, normocephalic, normal oropharynx  Cardiovascular: S1 S2 auscultated, no rubs, murmurs or gallops. Regular rate and rhythm.  Respiratory: Clear to auscultation bilaterally, no wheezing, rales or rhonchi  Gastrointestinal: Soft, nontender, nondistended, + bowel sounds  Ext: 1+ pedal edema bilaterally  Neuro: no new FND's  Musculoskeletal: No digital cyanosis, clubbing  Skin: No rashes  Psych: Normal affect and demeanor, alert and oriented x3    Data Reviewed:  I have personally reviewed following labs and imaging studies  Micro Results No results found for this or any previous visit (from the past 240 hour(s)).  Radiology Reports Ct Head Wo Contrast  Result Date: 08/15/2018 CLINICAL DATA:  Sudden onset generalized weakness at noon today. Blurry vision when standing. Nausea and headache. EXAM: CT HEAD WITHOUT CONTRAST TECHNIQUE:  Contiguous axial images were obtained from the base of the skull through the vertex without intravenous contrast. COMPARISON:  03/21/2017 FINDINGS: Brain: Diffuse cerebral atrophy. Ventricular dilatation consistent with central atrophy. Low-attenuation changes throughout the deep white matter consistent with small vessel ischemic changes and lacunar infarcts. No mass effect or midline shift. No abnormal extra-axial fluid collections. Gray-white matter junctions are distinct. Basal cisterns are not effaced. No acute intracranial hemorrhage. Vascular: Moderate intracranial arterial calcifications are present. Skull: Calvarium appears intact. Sinuses/Orbits: Old fracture deformity of the left medial orbital wall. No significant opacification of the paranasal sinuses or mastoid air cells. Other: None. IMPRESSION: No acute intracranial abnormalities. Chronic atrophy and small vessel  ischemic changes. Electronically Signed   By: Lucienne Capers M.D.   On: 08/15/2018 22:42    Lab Data:  CBC: Recent Labs  Lab 08/15/18 2115 08/16/18 0543 08/17/18 0428  WBC 11.4* 9.2 10.0  HGB 14.8 13.4 13.5  HCT 44.8 40.7 41.6  MCV 100.9* 100.5* 100.5*  PLT 234 207 130   Basic Metabolic Panel: Recent Labs  Lab 08/15/18 2115 08/15/18 2314 08/16/18 0543 08/16/18 1045 08/17/18 0428  NA 133*  --  137  --  136  K 2.6*  --  2.4* 3.2* 2.8*  CL 90*  --  96*  --  98  CO2 30  --  31  --  29  GLUCOSE 111*  --  102*  --  98  BUN 36*  --  30*  --  29*  CREATININE 1.39*  --  1.16  --  1.15  CALCIUM 9.8  --  8.7*  --  9.1  MG  --  1.8  --   --  2.2   GFR: Estimated Creatinine Clearance: 35.4 mL/min (by C-G formula based on SCr of 1.15 mg/dL). Liver Function Tests: No results for input(s): AST, ALT, ALKPHOS, BILITOT, PROT, ALBUMIN in the last 168 hours. No results for input(s): LIPASE, AMYLASE in the last 168 hours. No results for input(s): AMMONIA in the last 168 hours. Coagulation Profile: Recent Labs  Lab  08/15/18 2115 08/16/18 0543 08/17/18 0428  INR 2.54 2.75 2.81   Cardiac Enzymes: Recent Labs  Lab 08/15/18 2115  TROPONINI <0.03   BNP (last 3 results) No results for input(s): PROBNP in the last 8760 hours. HbA1C: No results for input(s): HGBA1C in the last 72 hours. CBG: Recent Labs  Lab 08/15/18 2111  GLUCAP 106*   Lipid Profile: No results for input(s): CHOL, HDL, LDLCALC, TRIG, CHOLHDL, LDLDIRECT in the last 72 hours. Thyroid Function Tests: No results for input(s): TSH, T4TOTAL, FREET4, T3FREE, THYROIDAB in the last 72 hours. Anemia Panel: No results for input(s): VITAMINB12, FOLATE, FERRITIN, TIBC, IRON, RETICCTPCT in the last 72 hours. Urine analysis:    Component Value Date/Time   COLORURINE STRAW (A) 08/15/2018 2128   APPEARANCEUR CLEAR 08/15/2018 2128   LABSPEC 1.006 08/15/2018 2128   PHURINE 7.0 08/15/2018 2128   GLUCOSEU NEGATIVE 08/15/2018 2128   HGBUR SMALL (A) 08/15/2018 2128   BILIRUBINUR NEGATIVE 08/15/2018 2128   KETONESUR NEGATIVE 08/15/2018 2128   PROTEINUR NEGATIVE 08/15/2018 2128   UROBILINOGEN 0.2 07/08/2015 0428   NITRITE NEGATIVE 08/15/2018 2128   LEUKOCYTESUR NEGATIVE 08/15/2018 2128     Miguel Medal M.D. [facility] 08/17/2018, 12:09 PM  Pager: [phone] Between 7am to 7pm - call Pager - [phone]  After 7pm go to [email or web address] - password [email or web address]  Call night coverage person covering after 7pm

## 2018-08-18 DIAGNOSIS — M109 Gout, unspecified: Secondary | ICD-10-CM | POA: Diagnosis not present

## 2018-08-18 DIAGNOSIS — J449 Chronic obstructive pulmonary disease, unspecified: Secondary | ICD-10-CM | POA: Diagnosis not present

## 2018-08-18 DIAGNOSIS — I495 Sick sinus syndrome: Secondary | ICD-10-CM | POA: Diagnosis not present

## 2018-08-18 DIAGNOSIS — I482 Chronic atrial fibrillation, unspecified: Secondary | ICD-10-CM | POA: Diagnosis not present

## 2018-08-18 DIAGNOSIS — D72828 Other elevated white blood cell count: Secondary | ICD-10-CM | POA: Diagnosis not present

## 2018-08-18 DIAGNOSIS — E559 Vitamin D deficiency, unspecified: Secondary | ICD-10-CM | POA: Diagnosis not present

## 2018-08-18 DIAGNOSIS — I5032 Chronic diastolic (congestive) heart failure: Secondary | ICD-10-CM | POA: Diagnosis not present

## 2018-08-18 DIAGNOSIS — Z7982 Long term (current) use of aspirin: Secondary | ICD-10-CM | POA: Diagnosis not present

## 2018-08-18 DIAGNOSIS — E876 Hypokalemia: Secondary | ICD-10-CM | POA: Diagnosis not present

## 2018-08-18 DIAGNOSIS — I4891 Unspecified atrial fibrillation: Secondary | ICD-10-CM | POA: Diagnosis not present

## 2018-08-18 DIAGNOSIS — I951 Orthostatic hypotension: Secondary | ICD-10-CM | POA: Diagnosis not present

## 2018-08-18 DIAGNOSIS — I4811 Longstanding persistent atrial fibrillation: Secondary | ICD-10-CM | POA: Diagnosis not present

## 2018-08-18 DIAGNOSIS — I110 Hypertensive heart disease with heart failure: Secondary | ICD-10-CM | POA: Diagnosis not present

## 2018-08-18 DIAGNOSIS — Z7901 Long term (current) use of anticoagulants: Secondary | ICD-10-CM | POA: Diagnosis not present

## 2018-08-18 DIAGNOSIS — I872 Venous insufficiency (chronic) (peripheral): Secondary | ICD-10-CM | POA: Diagnosis not present

## 2018-08-18 DIAGNOSIS — F419 Anxiety disorder, unspecified: Secondary | ICD-10-CM | POA: Diagnosis not present

## 2018-08-18 DIAGNOSIS — J441 Chronic obstructive pulmonary disease with (acute) exacerbation: Secondary | ICD-10-CM | POA: Diagnosis not present

## 2018-08-18 DIAGNOSIS — R05 Cough: Secondary | ICD-10-CM | POA: Diagnosis not present

## 2018-08-18 LAB — BASIC METABOLIC PANEL
Anion gap: 9 (ref 5–15)
BUN: 25 mg/dL — ABNORMAL HIGH (ref 8–23)
CALCIUM: 9.7 mg/dL (ref 8.9–10.3)
CHLORIDE: 99 mmol/L (ref 98–111)
CO2: 30 mmol/L (ref 22–32)
Creatinine, Ser: 1.06 mg/dL (ref 0.61–1.24)
GFR, EST NON AFRICAN AMERICAN: 58 mL/min — AB (ref >60)
Glucose, Bld: 120 mg/dL — ABNORMAL HIGH (ref 70–99)
Potassium: 3 mmol/L — ABNORMAL LOW (ref 3.5–5.1)
SODIUM: 138 mmol/L (ref 135–145)

## 2018-08-18 LAB — PROTIME-INR
INR: 2.6
Prothrombin Time: 27.6 seconds — ABNORMAL HIGH (ref 11.4–15.2)

## 2018-08-18 MED ORDER — POTASSIUM CHLORIDE CRYS ER 20 MEQ PO TBCR
40.0000 meq | EXTENDED_RELEASE_TABLET | ORAL | Status: AC
  Administered 2018-08-18 (×2): 40 meq via ORAL
  Filled 2018-08-18 (×2): qty 2

## 2018-08-18 MED ORDER — POTASSIUM CHLORIDE CRYS ER 20 MEQ PO TBCR: 40.0000 meq | EXTENDED_RELEASE_TABLET | Freq: Two times a day (BID) | ORAL | 0 refills | Status: DC

## 2018-08-18 MED ORDER — ACETAMINOPHEN 325 MG PO TABS: 650.0000 mg | ORAL_TABLET | Freq: Four times a day (QID) | ORAL | Status: AC | PRN

## 2018-08-18 MED ORDER — POTASSIUM CHLORIDE CRYS ER 20 MEQ PO TBCR: 40.0000 meq | EXTENDED_RELEASE_TABLET | Freq: Two times a day (BID) | ORAL | Status: DC

## 2018-08-18 MED ORDER — WARFARIN SODIUM 2.5 MG PO TABS
1.2500 mg | ORAL_TABLET | Freq: Once | ORAL | Status: DC
  Filled 2018-08-18: qty 0.5

## 2018-08-18 MED ORDER — FUROSEMIDE 80 MG PO TABS
80.0000 mg | ORAL_TABLET | Freq: Two times a day (BID) | ORAL | Status: DC
  Administered 2018-08-18: 80 mg via ORAL
  Filled 2018-08-18: qty 1

## 2018-08-18 NOTE — Clinical Social Work Placement (Signed)
   CLINICAL SOCIAL WORK PLACEMENT  NOTE  Date:  08/18/2018  Patient Details  Name: [name] MRN: [id number] Date of Birth: Sep 24, 1923  Clinical Social Work is seeking post-discharge placement for this patient at the [place] level of care (*CSW will initial, date and re-position this form in  chart as items are completed):  Yes   Patient/family provided with [facility] Work Department's list of facilities offering this level of care within the geographic area requested by the patient (or if unable, by the patient's family).  Yes   Patient/family informed of their freedom to choose among providers that offer the needed level of care, that participate in Medicare, Medicaid or managed care program needed by the patient, have an available bed and are willing to accept the patient.  Yes   Patient/family informed of [facility]'s ownership interest in [facility] and [facility], as well as of the fact that they are under no obligation to receive care at these facilities.  PASRR submitted to EDS on 08/16/18     PASRR number received on       Existing PASRR number confirmed on 08/16/18     FL2 transmitted to all facilities in geographic area requested by pt/family on 08/16/18     FL2 transmitted to all facilities within larger geographic area on       Patient informed that his/her managed care company has contracts with or will negotiate with certain facilities, including the following:        Yes   Patient/family informed of bed offers received.  Patient chooses bed at [facility]     Physician recommends and patient chooses bed at      Patient to be transferred to [facility] on 08/18/18.  Patient to be transferred to facility by [facility]     Patient family notified on 08/18/18 of transfer.  Name of family member notified:  Letta Pate     PHYSICIAN Please prepare prescriptions, Please sign DNR      Additional Comment:    _______________________________________________ Candie Chroman, LCSW 08/18/2018, 9:59 AM

## 2018-08-18 NOTE — Plan of Care (Signed)

## 2018-08-18 NOTE — Clinical Social Work Note (Signed)
CSW facilitated patient discharge including contacting patient family and facility to confirm patient discharge plans. Clinical information faxed to facility and family agreeable with plan. CSW arranged ambulance transport via [facility] to [facility] at 1:45 pm. RN to call report prior to discharge [phone]).  CSW will sign off for now as social work intervention is no longer needed. Please consult [place] again if new needs arise.  Dayton Scrape, [place]

## 2018-08-18 NOTE — Progress Notes (Signed)
Patient discharged to [facility] (SNF) in [place]. Report called to [facility], LPN.

## 2018-08-18 NOTE — Progress Notes (Signed)
Physical Therapy Treatment Patient Details Name: [name] MRN: [id number] DOB: 09/29/1923 Today's Date: 08/18/2018    History of Present Illness Pt is 84 y/o male presenting with lightheadedness and new onset weakness. PMHx: HTN, HLD, Afib, OA of spine and hands, GERD, gout, tachybradycardia syndrome s/p pacemaker, CHF, chronic venous insufficiency, neuropathy, non-hodkin's lymphoma.     PT Comments    Pt progressing well, incr gait distance/tolerance today; continue to recommend SNF post acute to maximize independence and safety prior to return home; continue PT POC   Follow Up Recommendations  SNF;Supervision/Assistance - 24 hour     Equipment Recommendations  None recommended by PT    Recommendations for Other Services       Precautions / Restrictions Precautions Precautions: Fall Restrictions Weight Bearing Restrictions: No    Mobility  Bed Mobility               General bed mobility comments: pt in chair  Transfers Overall transfer level: Needs assistance Equipment used: Rolling walker (2 wheeled) Transfers: Sit to/from Stand Sit to Stand: Min assist;Min guard         General transfer comment: cues for incr knee flexion, scooting to edge of chair, hand placement; light assist for anterior superior wt translation; incr time   Ambulation/Gait Ambulation/Gait assistance: Min guard;Min assist Gait Distance (Feet): 50 Feet Assistive device: Rolling walker (2 wheeled) Gait Pattern/deviations: Trunk flexed;Narrow base of support;Step-through pattern;Decreased stride length     General Gait Details: pt  with  LLE with in external rotation, decr push off bil, decr heel strike, decreased stride. cues for step length and trunk extension   Stairs             Wheelchair Mobility    Modified Rankin (Stroke Patients Only)       Balance             Standing balance-Leahy Scale: Poor Standing balance comment: biL UE support for safe  static standing                            Cognition Arousal/Alertness: Awake/alert Behavior During Therapy: WFL for tasks assessed/performed                                          Exercises General Exercises - Lower Extremity Toe Raises: AROM;Both;10 reps Heel Raises: AROM;Both;10 reps    General Comments        Pertinent Vitals/Pain Pain Assessment: Faces Faces Pain Scale: Hurts little more Pain Location: generalized, with movement, L UE Pain Descriptors / Indicators: Discomfort;Grimacing Pain Intervention(s): Monitored during session    Home Living                      Prior Function            PT Goals (current goals can now be found in the care plan section) Acute Rehab PT Goals Patient Stated Goal: rehab then home PT Goal Formulation: With patient/family Time For Goal Achievement: 08/16/18 Potential to Achieve Goals: Fair Progress towards PT goals: Progressing toward goals    Frequency    Min 3X/week      PT Plan Current plan remains appropriate    Co-evaluation              AM-PAC PT "6 Clicks" Daily Activity  Outcome  Measure  Difficulty turning over in bed (including adjusting bedclothes, sheets and blankets)?: A Lot Difficulty moving from lying on back to sitting on the side of the bed? : Unable Difficulty sitting down on and standing up from a chair with arms (e.g., wheelchair, bedside commode, etc,.)?: Unable Help needed moving to and from a bed to chair (including a wheelchair)?: A Little Help needed walking in hospital room?: A Little Help needed climbing 3-5 steps with a railing? : A Lot 6 Click Score: 12    End of Session Equipment Utilized During Treatment: Gait belt Activity Tolerance: Patient tolerated treatment well;Patient limited by fatigue Patient left: in chair;with call bell/phone within reach;with chair alarm set   PT Visit Diagnosis: Other abnormalities of gait and mobility  (R26.89);Muscle weakness (generalized) (M62.81);Unsteadiness on feet (R26.81);History of falling (Z91.81)     Time: 6948-5462 PT Time Calculation (min) (ACUTE ONLY): 29 min  Charges:  $Gait Training: 23-37 mins                     Kenyon Ana, [place] Pager: [phone] 08/18/2018   [facility] [phone]    [facility] 08/18/2018, 11:22 AM

## 2018-08-18 NOTE — Clinical Social Work Note (Signed)
Authorization approved for discharge to [employer] today: [id number]. Patient and son [name] aware. Patient will need PTAR.  [place] Scrape, [place]

## 2018-08-18 NOTE — Discharge Instructions (Signed)

## 2018-08-18 NOTE — Discharge Summary (Signed)
Physician Discharge Summary   Patient ID: [name] MRN: [id number] DOB/AGE: [birth year] 84 y.o.  Admit date: 08/15/2018 Discharge date: 08/18/2018  Primary Care Physician:  Lavone Orn, MD   Recommendations for Outpatient Follow-up:  1. Follow up with PCP in 1-2 weeks 2. Please obtain BMP in one week for potassium and renal function 3. Please note metolazone currently placed on hold until follow-up with cardiology  Home Health: None, patient discharging to skilled nursing facility Equipment/Devices:   Discharge Condition: stable o CODE STATUS:  DNR   Diet recommendation: Heart healthy diet, low-sodium   Discharge Diagnoses:    . Atrial fibrillation ([place]) . Tachy-brady syndrome ([place]) . Venous stasis dermatitis of both lower extremities . Hypokalemia . Leukocytosis . Chronic diastolic CHF (congestive heart failure) ([place]) . Gout . Orthostatic hypotension   Consults: None    Allergies:   Allergies  Allergen Reactions  . Avelox [Moxifloxacin Hcl In Nacl] Other (See Comments)    Messed up lungs, Respiratory Distress   . Indomethacin Other (See Comments)    Renal insufficiency  . Levaquin [Levofloxacin] Other (See Comments)    Messed up lungs  . Lipitor [Atorvastatin] Nausea Only, Palpitations and Other (See Comments)    Irregular heart beat also  . Morphine And Related Other (See Comments)    Irregular heart beart  . Pravachol [Pravastatin Sodium] Nausea Only, Palpitations and Other (See Comments)    Irregular heart beat  . Aleve [Naproxen Sodium] Nausea And Vomiting and Other (See Comments)    GI Upset  . Clindamycin/Lincomycin Other (See Comments)    Severe acid reflux and stomach pain  . Norpace [Disopyramide Phosphate] Other (See Comments)    Urinary retention  . Tripelennamine   . Amlodipine Besylate Rash  . Azithromycin Rash  . Bactrim [Sulfamethoxazole-Trimethoprim] Rash  . Clinoril [Sulindac] Rash  . Codeine Other (See Comments)    unknown   . Digoxin Rash  . Lorazepam Other (See Comments)    unknown  . Oxycodone-Acetaminophen Rash  . Sulfonamide Derivatives Rash  . Uloric [Febuxostat] Other (See Comments)    unknown     DISCHARGE MEDICATIONS: Allergies as of 08/18/2018      Reactions   Avelox [moxifloxacin Hcl In Nacl] Other (See Comments)   Messed up lungs, Respiratory Distress   Indomethacin Other (See Comments)   Renal insufficiency   Levaquin [levofloxacin] Other (See Comments)   Messed up lungs   Lipitor [atorvastatin] Nausea Only, Palpitations, Other (See Comments)   Irregular heart beat also   Morphine And Related Other (See Comments)   Irregular heart beart   Pravachol [pravastatin Sodium] Nausea Only, Palpitations, Other (See Comments)   Irregular heart beat   Aleve [naproxen Sodium] Nausea And Vomiting, Other (See Comments)   GI Upset   Clindamycin/lincomycin Other (See Comments)   Severe acid reflux and stomach pain   Norpace [disopyramide Phosphate] Other (See Comments)   Urinary retention   Tripelennamine    Amlodipine Besylate Rash   Azithromycin Rash   Bactrim [sulfamethoxazole-trimethoprim] Rash   Clinoril [sulindac] Rash   Codeine Other (See Comments)   unknown   Digoxin Rash   Lorazepam Other (See Comments)   unknown   Oxycodone-acetaminophen Rash   Sulfonamide Derivatives Rash   Uloric [febuxostat] Other (See Comments)   unknown      Medication List    STOP taking these medications   metolazone 2.5 MG tablet Commonly known as:  ZAROXOLYN     TAKE these medications   acetaminophen 325  MG tablet Commonly known as:  TYLENOL Take 2 tablets (650 mg total) by mouth every 6 (six) hours as needed for mild pain. What changed:    medication strength  how much to take  when to take this  reasons to take this   albuterol (2.5 MG/3ML) 0.083% nebulizer solution Commonly known as:  PROVENTIL Take 3 mLs (2.5 mg total) by nebulization every 2 (two) hours as needed for wheezing or  shortness of breath.   allopurinol 300 MG tablet Commonly known as:  ZYLOPRIM Take 300 mg by mouth daily.   ALPRAZolam 0.5 MG tablet Commonly known as:  XANAX Take 1 tablet (0.5 mg total) by mouth at bedtime as needed for anxiety. For sleep   aspirin 81 MG tablet Take 1 tablet (81 mg total) by mouth every other day.   atenolol 50 MG tablet Commonly known as:  TENORMIN Take 1 tablet (50 mg total) by mouth daily.   Cholecalciferol 4000 units Tabs Take 4,000 Units by mouth daily.   clotrimazole-betamethasone cream Commonly known as:  LOTRISONE Apply 1 application topically 2 (two) times daily as needed (rash).   colchicine 0.6 MG tablet Take 0.6 mg by mouth daily as needed (gout).   feeding supplement (ENSURE ENLIVE) Liqd Take 237 mLs by mouth 2 (two) times daily between meals.   furosemide 80 MG tablet Commonly known as:  LASIX Take 80 mg by mouth 2 (two) times daily.   gabapentin 100 MG capsule Commonly known as:  NEURONTIN Take 100 mg by mouth at bedtime as needed (nerve pain).   ipratropium-albuterol 0.5-2.5 (3) MG/3ML Soln Commonly known as:  DUONEB Take 3 mLs by nebulization every 6 (six) hours as needed.   miconazole 2 % powder Commonly known as:  MICOTIN Apply topically See admin instructions. Apply topically daily after bath - for rash   mometasone-formoterol 200-5 MCG/ACT Aero Commonly known as:  DULERA Inhale 2 puffs into the lungs 2 (two) times daily.   nitroGLYCERIN 0.4 MG SL tablet Commonly known as:  NITROSTAT Place 0.4 mg under the tongue every 5 (five) minutes as needed for chest pain.   nystatin 100000 UNIT/ML suspension Commonly known as:  MYCOSTATIN Take 5 mLs by mouth 2 (two) times daily. Swish and swallow   potassium chloride SA 20 MEQ tablet Commonly known as:  K-DUR,KLOR-CON Take 2 tablets (40 mEq total) by mouth 2 (two) times daily.   senna-docusate 8.6-50 MG tablet Commonly known as:  Senokot-S Take 2 tablets by mouth at  bedtime.   warfarin 2.5 MG tablet Commonly known as:  COUMADIN Take 1 tablet (2.5 mg total) by mouth daily at 6 PM. Or as directed What changed:    how much to take  when to take this  additional instructions        Brief H and P: For complete details please refer to admission H and P, but in brief Patient is a 84 year old male with hypertension, hyperlipidemia, chronic A. fib, tachybradycardia syndrome status post permanent pacemaker, chronic diastolic CHF, chronic venous insufficiency presented to ED with lightheadedness.Patient states he woke up after nap this afternoon and did not feel good. He then used awalker to go to the bathroom and felt very lightheaded and unsteady. He felt weak and it was difficult for him to get back in a chair. States his appetite has been low andhis mouth feels dry. He takes Lasix and metolazone for chronic diastolic congestive heart failure. Dose of Lasixincreased several months ago increased from 120 to 160.  Patient takes metolazone if weight greater than 180 and had recently required 2-3 times a week which was more than his usual.  Recent weight was 179 and patient took the metolazone as it was close to 180.  He denied any loss of consciousness. At the time of admission, hypokalemic with potassium 2.6, creatinine 1.3, white count 11.4.  Hospital Course:   Hypokalemia -Likely related to diuretics and decreased oral intake.  Lasix was increased in 5/20 19 to 160 mg daily and patient has taken metolazone multiple times this past week. -Potassium was extremely low at 2.4, received aggressive replacements -I's and O's were monitored, negative balance of 480 cc -Both Lasix and metolazone were held -Discussed in detail with the patient and son at the bedside, patient had taken multiple doses of metolazone this past week. -Recommended continue Lasix, hold metolazone until patient has followed up with his cardiologist -Potassium 3.0 today, patient  receiving K-Dur replacement prior to discharge. -Magnesium within normal limits  Orthostatic hypotension -Currently stable, in the setting of increased Lasix dose and metolazone, had taken metolazone as needed dose more frequently.  -Will need to follow-up with cardiology regarding instructions with the as needed dosing of metolazone or may need sliding scale. -Continue Lasix for now at the same outpatient dose with potassium replacement -Check BP daily, if running in low 90's, recommend to decrease Lasix back to 60 mg twice a day.    Mild leukocytosis Likely reactive, afebrile, no source of infection Repeat WBC count normal  Chronic atrial fibrillation -Rate controlled, continue Coumadin per pharmacy, INR therapeutic - [place] VASC 4  Tachybradycardia syndrome Pacemaker interrogated on 10/1, normal function  Essential hypertension BP currently soft, on atenolol and Lasix   Chronic diastolic CHF Appears to be dry at the time of admission, no JVD, lungs clear, lower extremity edema chronic Lasix and metolazone were held, potassium was aggressively replaced Weight 177 at the time of discharge, continue to hold metolazone   Chronic venous insufficiency Continue wound care, Unna boots  Gout Continue allopurinol  Constipation Resolved  Day of Discharge S: States did not sleep well last night, feels tired.  No chest pain or shortness of breath.  BP (!) 115/55   Pulse (!) 55   Temp 98.4 F (36.9 C) (Oral)   Resp 18   Ht 5' (1.524 m)   Wt 80.5 kg Comment: Scale A  SpO2 95%   BMI 34.67 kg/m   Physical Exam: General: Alert and awake oriented x3 not in any acute distress. HEENT: anicteric sclera, pupils reactive to light and accommodation CVS: S1-S2 clear no murmur rubs or gallops Chest: clear to auscultation bilaterally, no wheezing rales or rhonchi Abdomen: soft nontender, nondistended, normal bowel sounds Extremities:Unna boots b/l  Neuro: Cranial nerves  II-XII intact, no focal neurological deficits   The results of significant diagnostics from this hospitalization (including imaging, microbiology, ancillary and laboratory) are listed below for reference.      Procedures/Studies:  Ct Head Wo Contrast  Result Date: 08/15/2018 CLINICAL DATA:  Sudden onset generalized weakness at noon today. Blurry vision when standing. Nausea and headache. EXAM: CT HEAD WITHOUT CONTRAST TECHNIQUE: Contiguous axial images were obtained from the base of the skull through the vertex without intravenous contrast. COMPARISON:  03/21/2017 FINDINGS: Brain: Diffuse cerebral atrophy. Ventricular dilatation consistent with central atrophy. Low-attenuation changes throughout the deep white matter consistent with small vessel ischemic changes and lacunar infarcts. No mass effect or midline shift. No abnormal extra-axial fluid collections. Gray-white matter junctions  are distinct. Basal cisterns are not effaced. No acute intracranial hemorrhage. Vascular: Moderate intracranial arterial calcifications are present. Skull: Calvarium appears intact. Sinuses/Orbits: Old fracture deformity of the left medial orbital wall. No significant opacification of the paranasal sinuses or mastoid air cells. Other: None. IMPRESSION: No acute intracranial abnormalities. Chronic atrophy and small vessel ischemic changes. Electronically Signed   By: Lucienne Capers M.D.   On: 08/15/2018 22:42       LAB RESULTS: Basic Metabolic Panel: Recent Labs  Lab 08/17/18 0428 08/17/18 1506 08/18/18 0441  NA 136  --  138  K 2.8* 3.2* 3.0*  CL 98  --  99  CO2 29  --  30  GLUCOSE 98  --  120*  BUN 29*  --  25*  CREATININE 1.15  --  1.06  CALCIUM 9.1  --  9.7  MG 2.2  --   --    Liver Function Tests: No results for input(s): AST, ALT, ALKPHOS, BILITOT, PROT, ALBUMIN in the last 168 hours. No results for input(s): LIPASE, AMYLASE in the last 168 hours. No results for input(s): AMMONIA in the last  168 hours. CBC: Recent Labs  Lab 08/16/18 0543 08/17/18 0428  WBC 9.2 10.0  HGB 13.4 13.5  HCT 40.7 41.6  MCV 100.5* 100.5*  PLT 207 222   Cardiac Enzymes: Recent Labs  Lab 08/15/18 2115  TROPONINI <0.03   BNP: Invalid input(s): POCBNP CBG: Recent Labs  Lab 08/15/18 2111  GLUCAP 106*      Disposition and Follow-up: Discharge Instructions    (HEART FAILURE PATIENTS) Call MD:  Anytime you have any of the following symptoms: 1) 3 pound weight gain in 24 hours or 5 pounds in 1 week 2) shortness of breath, with or without a dry hacking cough 3) swelling in the hands, feet or stomach 4) if you have to sleep on extra pillows at night in order to breathe.   Complete by:  As directed    Diet - low sodium heart healthy   Complete by:  As directed    Increase activity slowly   Complete by:  As directed        DISPOSITION: [place] information for follow-up providers    Lavone Orn, MD. Schedule an appointment as soon as possible for a visit in 2 week(s).   Specialty:  Internal Medicine Contact information: [address] [place] [address] [phone]        Evans Lance, MD. Schedule an appointment as soon as possible for a visit in 10 day(s).   Specialty:  Cardiology Contact information: [address] [place] [address] [phone]            Contact information for after-discharge care    [facility] .   Service:  Skilled Nursing Contact information: [place] [phone]                   Time coordinating discharge:  35 minutes  Signed:   Estill Cotta M.D. [facility] 08/18/2018, 9:28 AM Pager: [phone]

## 2018-08-18 NOTE — Progress Notes (Signed)
[place] for Coumadin Indication: atrial fibrillation  Patient Measurements: Height: 5' (152.4 cm) Weight: 177 lb 8 oz (80.5 kg)(Scale A) IBW/kg (Calculated) : 50  Vital Signs: Temp: 98.4 F (36.9 C) (10/10 0412) Temp Source: Oral (10/10 0412) BP: 115/55 (10/10 0813) Pulse Rate: 55 (10/10 0813)  Labs: Recent Labs    08/15/18 2115 08/16/18 0543 08/17/18 0428 08/18/18 0441  HGB 14.8 13.4 13.5  --   HCT 44.8 40.7 41.6  --   PLT 234 207 222  --   LABPROT 27.2* 28.9* 29.4* 27.6*  INR 2.54 2.75 2.81 2.60  CREATININE 1.39* 1.16 1.15 1.06  TROPONINI <0.03  --   --   --      Medical History: Past Medical History:  Diagnosis Date  . Arrhythmia    afib  . Arthritis    "back and hands" (09/26/2015)  . Basal cell carcinoma   . CHF (congestive heart failure) ([place])   . Coronary artery disease   . CVI (common variable immunodeficiency) ([place])   . DJD (degenerative joint disease)   . ED (erectile dysfunction)   . GERD (gastroesophageal reflux disease)   . Gout   . Heart attack ([place]) 1986  . Hyperlipidemia   . Hypertension   . IFG (impaired fasting glucose)   . Neuropathy   . Non Hodgkin's lymphoma ([place]) dx'd 2006  . PAC (premature atrial contraction)   . PE (pulmonary embolism) 2007   "when I was taking chemo"  . Peptic ulcer disease   . Pneumonia ~ 2007 X 2  . Squamous carcinoma   . Venous stasis ulcers ([facility])     Assessment: 84 yo male complained of generalized weakness. Patient is on warfarin prior to admission for history of PE. INR today is 2.6.  PTA warfarin regimen: 2.5 mg and 1.25 mg on alternating days  Goal of Therapy:  INR 2-3   Plan:  -warfarin 1.25 mg po x1 -daily INR   Harvel Quale 08/18/2018 10:37 AM

## 2018-08-22 DIAGNOSIS — I4891 Unspecified atrial fibrillation: Secondary | ICD-10-CM | POA: Diagnosis not present

## 2018-08-22 DIAGNOSIS — E876 Hypokalemia: Secondary | ICD-10-CM | POA: Diagnosis not present

## 2018-08-22 DIAGNOSIS — I5032 Chronic diastolic (congestive) heart failure: Secondary | ICD-10-CM | POA: Diagnosis not present

## 2018-09-02 DIAGNOSIS — J441 Chronic obstructive pulmonary disease with (acute) exacerbation: Secondary | ICD-10-CM | POA: Diagnosis not present

## 2018-09-03 DIAGNOSIS — R05 Cough: Secondary | ICD-10-CM | POA: Diagnosis not present

## 2018-09-13 DIAGNOSIS — M791 Myalgia, unspecified site: Secondary | ICD-10-CM | POA: Diagnosis not present

## 2018-09-13 DIAGNOSIS — Z7901 Long term (current) use of anticoagulants: Secondary | ICD-10-CM | POA: Diagnosis not present

## 2018-09-13 DIAGNOSIS — E876 Hypokalemia: Secondary | ICD-10-CM | POA: Diagnosis not present

## 2018-09-13 DIAGNOSIS — E860 Dehydration: Secondary | ICD-10-CM | POA: Diagnosis not present

## 2018-09-13 DIAGNOSIS — J209 Acute bronchitis, unspecified: Secondary | ICD-10-CM | POA: Diagnosis not present

## 2018-09-23 DIAGNOSIS — M1712 Unilateral primary osteoarthritis, left knee: Secondary | ICD-10-CM | POA: Diagnosis not present

## 2018-10-02 DIAGNOSIS — I110 Hypertensive heart disease with heart failure: Secondary | ICD-10-CM | POA: Diagnosis not present

## 2018-10-02 DIAGNOSIS — Z8701 Personal history of pneumonia (recurrent): Secondary | ICD-10-CM | POA: Diagnosis not present

## 2018-10-02 DIAGNOSIS — I2510 Atherosclerotic heart disease of native coronary artery without angina pectoris: Secondary | ICD-10-CM | POA: Diagnosis not present

## 2018-10-02 DIAGNOSIS — K219 Gastro-esophageal reflux disease without esophagitis: Secondary | ICD-10-CM | POA: Diagnosis not present

## 2018-10-02 DIAGNOSIS — G629 Polyneuropathy, unspecified: Secondary | ICD-10-CM | POA: Diagnosis not present

## 2018-10-02 DIAGNOSIS — E785 Hyperlipidemia, unspecified: Secondary | ICD-10-CM | POA: Diagnosis not present

## 2018-10-02 DIAGNOSIS — S50912D Unspecified superficial injury of left forearm, subsequent encounter: Secondary | ICD-10-CM | POA: Diagnosis not present

## 2018-10-02 DIAGNOSIS — Z950 Presence of cardiac pacemaker: Secondary | ICD-10-CM | POA: Diagnosis not present

## 2018-10-02 DIAGNOSIS — I4891 Unspecified atrial fibrillation: Secondary | ICD-10-CM | POA: Diagnosis not present

## 2018-10-02 DIAGNOSIS — J441 Chronic obstructive pulmonary disease with (acute) exacerbation: Secondary | ICD-10-CM | POA: Diagnosis not present

## 2018-10-02 DIAGNOSIS — I5033 Acute on chronic diastolic (congestive) heart failure: Secondary | ICD-10-CM | POA: Diagnosis not present

## 2018-10-02 DIAGNOSIS — M1990 Unspecified osteoarthritis, unspecified site: Secondary | ICD-10-CM | POA: Diagnosis not present

## 2018-10-02 DIAGNOSIS — I255 Ischemic cardiomyopathy: Secondary | ICD-10-CM | POA: Diagnosis not present

## 2018-10-04 DIAGNOSIS — E876 Hypokalemia: Secondary | ICD-10-CM | POA: Diagnosis not present

## 2018-10-04 DIAGNOSIS — I5033 Acute on chronic diastolic (congestive) heart failure: Secondary | ICD-10-CM | POA: Diagnosis not present

## 2018-10-12 ENCOUNTER — Ambulatory Visit (INDEPENDENT_AMBULATORY_CARE_PROVIDER_SITE_OTHER): Payer: Medicare Other

## 2018-10-12 DIAGNOSIS — I495 Sick sinus syndrome: Secondary | ICD-10-CM | POA: Diagnosis not present

## 2018-10-12 NOTE — Progress Notes (Signed)
Remote pacemaker transmission.   

## 2018-10-18 ENCOUNTER — Encounter: Payer: Self-pay | Admitting: Cardiology

## 2018-10-24 DIAGNOSIS — H524 Presbyopia: Secondary | ICD-10-CM | POA: Diagnosis not present

## 2018-10-28 DIAGNOSIS — I5042 Chronic combined systolic (congestive) and diastolic (congestive) heart failure: Secondary | ICD-10-CM | POA: Diagnosis not present

## 2018-10-28 DIAGNOSIS — I872 Venous insufficiency (chronic) (peripheral): Secondary | ICD-10-CM | POA: Diagnosis not present

## 2018-10-28 DIAGNOSIS — Z5181 Encounter for therapeutic drug level monitoring: Secondary | ICD-10-CM | POA: Diagnosis not present

## 2018-10-28 DIAGNOSIS — Z7901 Long term (current) use of anticoagulants: Secondary | ICD-10-CM | POA: Diagnosis not present

## 2018-11-23 DIAGNOSIS — C44629 Squamous cell carcinoma of skin of left upper limb, including shoulder: Secondary | ICD-10-CM | POA: Diagnosis not present

## 2018-11-23 DIAGNOSIS — Z85828 Personal history of other malignant neoplasm of skin: Secondary | ICD-10-CM | POA: Diagnosis not present

## 2018-11-23 DIAGNOSIS — L570 Actinic keratosis: Secondary | ICD-10-CM | POA: Diagnosis not present

## 2018-11-23 DIAGNOSIS — L821 Other seborrheic keratosis: Secondary | ICD-10-CM | POA: Diagnosis not present

## 2018-12-02 DIAGNOSIS — I10 Essential (primary) hypertension: Secondary | ICD-10-CM | POA: Diagnosis not present

## 2018-12-02 DIAGNOSIS — Z7901 Long term (current) use of anticoagulants: Secondary | ICD-10-CM | POA: Diagnosis not present

## 2018-12-02 DIAGNOSIS — Z950 Presence of cardiac pacemaker: Secondary | ICD-10-CM | POA: Diagnosis not present

## 2018-12-02 DIAGNOSIS — I878 Other specified disorders of veins: Secondary | ICD-10-CM | POA: Diagnosis not present

## 2018-12-02 DIAGNOSIS — Z7982 Long term (current) use of aspirin: Secondary | ICD-10-CM | POA: Diagnosis not present

## 2018-12-02 DIAGNOSIS — L97311 Non-pressure chronic ulcer of right ankle limited to breakdown of skin: Secondary | ICD-10-CM | POA: Diagnosis not present

## 2018-12-02 DIAGNOSIS — L97211 Non-pressure chronic ulcer of right calf limited to breakdown of skin: Secondary | ICD-10-CM | POA: Diagnosis not present

## 2018-12-02 DIAGNOSIS — Z5181 Encounter for therapeutic drug level monitoring: Secondary | ICD-10-CM | POA: Diagnosis not present

## 2018-12-02 DIAGNOSIS — I5042 Chronic combined systolic (congestive) and diastolic (congestive) heart failure: Secondary | ICD-10-CM | POA: Diagnosis not present

## 2018-12-02 LAB — CUP PACEART REMOTE DEVICE CHECK
Battery Remaining Longevity: 53 mo
Battery Remaining Percentage: 35 %
Implantable Lead Implant Date: 20111025
Implantable Lead Location: 753859
Lead Channel Pacing Threshold Pulse Width: 0.4 ms
Lead Channel Setting Pacing Amplitude: 0.75 V
Lead Channel Setting Sensing Sensitivity: 2 mV
MDC IDC LEAD IMPLANT DT: 20111025
MDC IDC LEAD LOCATION: 753860
MDC IDC MSMT BATTERY VOLTAGE: 2.83 V
MDC IDC MSMT LEADCHNL RV IMPEDANCE VALUE: 830 Ohm
MDC IDC MSMT LEADCHNL RV PACING THRESHOLD AMPLITUDE: 0.5 V
MDC IDC MSMT LEADCHNL RV SENSING INTR AMPL: 12 mV
MDC IDC PG IMPLANT DT: 20111025
MDC IDC SESS DTM: 20191204072040
MDC IDC SET LEADCHNL RV PACING PULSEWIDTH: 0.4 ms
MDC IDC STAT BRADY RV PERCENT PACED: 83 %
Pulse Gen Serial Number: 7178130

## 2018-12-13 DIAGNOSIS — Z85828 Personal history of other malignant neoplasm of skin: Secondary | ICD-10-CM | POA: Diagnosis not present

## 2018-12-13 DIAGNOSIS — C44629 Squamous cell carcinoma of skin of left upper limb, including shoulder: Secondary | ICD-10-CM | POA: Diagnosis not present

## 2018-12-14 DIAGNOSIS — I482 Chronic atrial fibrillation, unspecified: Secondary | ICD-10-CM | POA: Diagnosis not present

## 2018-12-14 DIAGNOSIS — I10 Essential (primary) hypertension: Secondary | ICD-10-CM | POA: Diagnosis not present

## 2018-12-14 DIAGNOSIS — I2510 Atherosclerotic heart disease of native coronary artery without angina pectoris: Secondary | ICD-10-CM | POA: Diagnosis not present

## 2018-12-14 DIAGNOSIS — I5032 Chronic diastolic (congestive) heart failure: Secondary | ICD-10-CM | POA: Diagnosis not present

## 2018-12-14 DIAGNOSIS — Z7901 Long term (current) use of anticoagulants: Secondary | ICD-10-CM | POA: Diagnosis not present

## 2019-01-04 DIAGNOSIS — I878 Other specified disorders of veins: Secondary | ICD-10-CM | POA: Diagnosis not present

## 2019-01-04 DIAGNOSIS — L97211 Non-pressure chronic ulcer of right calf limited to breakdown of skin: Secondary | ICD-10-CM | POA: Diagnosis not present

## 2019-01-04 DIAGNOSIS — Z7982 Long term (current) use of aspirin: Secondary | ICD-10-CM | POA: Diagnosis not present

## 2019-01-04 DIAGNOSIS — Z7901 Long term (current) use of anticoagulants: Secondary | ICD-10-CM | POA: Diagnosis not present

## 2019-01-04 DIAGNOSIS — L97311 Non-pressure chronic ulcer of right ankle limited to breakdown of skin: Secondary | ICD-10-CM | POA: Diagnosis not present

## 2019-01-04 DIAGNOSIS — I5042 Chronic combined systolic (congestive) and diastolic (congestive) heart failure: Secondary | ICD-10-CM | POA: Diagnosis not present

## 2019-01-04 DIAGNOSIS — Z950 Presence of cardiac pacemaker: Secondary | ICD-10-CM | POA: Diagnosis not present

## 2019-01-04 DIAGNOSIS — Z5181 Encounter for therapeutic drug level monitoring: Secondary | ICD-10-CM | POA: Diagnosis not present

## 2019-01-04 DIAGNOSIS — I10 Essential (primary) hypertension: Secondary | ICD-10-CM | POA: Diagnosis not present

## 2019-01-11 ENCOUNTER — Ambulatory Visit (INDEPENDENT_AMBULATORY_CARE_PROVIDER_SITE_OTHER): Payer: Medicare Other | Admitting: *Deleted

## 2019-01-11 DIAGNOSIS — I495 Sick sinus syndrome: Secondary | ICD-10-CM | POA: Diagnosis not present

## 2019-01-11 DIAGNOSIS — I482 Chronic atrial fibrillation, unspecified: Secondary | ICD-10-CM

## 2019-01-11 LAB — CUP PACEART REMOTE DEVICE CHECK
Battery Remaining Percentage: 29 %
Battery Voltage: 2.81 V
Date Time Interrogation Session: 20200304075240
Implantable Lead Implant Date: 20111025
Implantable Lead Location: 753860
Lead Channel Impedance Value: 910 Ohm
Lead Channel Pacing Threshold Pulse Width: 0.4 ms
Lead Channel Setting Pacing Pulse Width: 0.4 ms
MDC IDC LEAD IMPLANT DT: 20111025
MDC IDC LEAD LOCATION: 753859
MDC IDC MSMT BATTERY REMAINING LONGEVITY: 44 mo
MDC IDC MSMT LEADCHNL RV PACING THRESHOLD AMPLITUDE: 0.5 V
MDC IDC MSMT LEADCHNL RV SENSING INTR AMPL: 12 mV
MDC IDC PG IMPLANT DT: 20111025
MDC IDC SET LEADCHNL RV PACING AMPLITUDE: 0.75 V
MDC IDC SET LEADCHNL RV SENSING SENSITIVITY: 2 mV
MDC IDC STAT BRADY RV PERCENT PACED: 82 %
Pulse Gen Model: 2210
Pulse Gen Serial Number: 7178130

## 2019-01-18 NOTE — Progress Notes (Signed)
Remote pacemaker transmission.   

## 2019-03-10 DIAGNOSIS — Z950 Presence of cardiac pacemaker: Secondary | ICD-10-CM | POA: Diagnosis not present

## 2019-03-10 DIAGNOSIS — Z5181 Encounter for therapeutic drug level monitoring: Secondary | ICD-10-CM | POA: Diagnosis not present

## 2019-03-10 DIAGNOSIS — L97321 Non-pressure chronic ulcer of left ankle limited to breakdown of skin: Secondary | ICD-10-CM | POA: Diagnosis not present

## 2019-03-10 DIAGNOSIS — Z7901 Long term (current) use of anticoagulants: Secondary | ICD-10-CM | POA: Diagnosis not present

## 2019-03-10 DIAGNOSIS — I482 Chronic atrial fibrillation, unspecified: Secondary | ICD-10-CM | POA: Diagnosis not present

## 2019-03-10 DIAGNOSIS — I5032 Chronic diastolic (congestive) heart failure: Secondary | ICD-10-CM | POA: Diagnosis not present

## 2019-03-10 DIAGNOSIS — Z951 Presence of aortocoronary bypass graft: Secondary | ICD-10-CM | POA: Diagnosis not present

## 2019-03-10 DIAGNOSIS — I110 Hypertensive heart disease with heart failure: Secondary | ICD-10-CM | POA: Diagnosis not present

## 2019-03-10 DIAGNOSIS — I2510 Atherosclerotic heart disease of native coronary artery without angina pectoris: Secondary | ICD-10-CM | POA: Diagnosis not present

## 2019-03-10 DIAGNOSIS — L97311 Non-pressure chronic ulcer of right ankle limited to breakdown of skin: Secondary | ICD-10-CM | POA: Diagnosis not present

## 2019-03-10 DIAGNOSIS — I872 Venous insufficiency (chronic) (peripheral): Secondary | ICD-10-CM | POA: Diagnosis not present

## 2019-03-10 DIAGNOSIS — Z87891 Personal history of nicotine dependence: Secondary | ICD-10-CM | POA: Diagnosis not present

## 2019-03-14 DIAGNOSIS — M109 Gout, unspecified: Secondary | ICD-10-CM | POA: Diagnosis not present

## 2019-03-14 DIAGNOSIS — I2510 Atherosclerotic heart disease of native coronary artery without angina pectoris: Secondary | ICD-10-CM | POA: Diagnosis not present

## 2019-03-14 DIAGNOSIS — I5032 Chronic diastolic (congestive) heart failure: Secondary | ICD-10-CM | POA: Diagnosis not present

## 2019-03-14 DIAGNOSIS — I872 Venous insufficiency (chronic) (peripheral): Secondary | ICD-10-CM | POA: Diagnosis not present

## 2019-03-22 DIAGNOSIS — E876 Hypokalemia: Secondary | ICD-10-CM | POA: Diagnosis not present

## 2019-03-22 DIAGNOSIS — Z5181 Encounter for therapeutic drug level monitoring: Secondary | ICD-10-CM | POA: Diagnosis not present

## 2019-03-27 DIAGNOSIS — Z85828 Personal history of other malignant neoplasm of skin: Secondary | ICD-10-CM | POA: Diagnosis not present

## 2019-03-27 DIAGNOSIS — D1801 Hemangioma of skin and subcutaneous tissue: Secondary | ICD-10-CM | POA: Diagnosis not present

## 2019-03-27 DIAGNOSIS — L821 Other seborrheic keratosis: Secondary | ICD-10-CM | POA: Diagnosis not present

## 2019-03-27 DIAGNOSIS — L570 Actinic keratosis: Secondary | ICD-10-CM | POA: Diagnosis not present

## 2019-03-27 DIAGNOSIS — C44321 Squamous cell carcinoma of skin of nose: Secondary | ICD-10-CM | POA: Diagnosis not present

## 2019-03-31 DIAGNOSIS — R6883 Chills (without fever): Secondary | ICD-10-CM | POA: Diagnosis not present

## 2019-03-31 DIAGNOSIS — N390 Urinary tract infection, site not specified: Secondary | ICD-10-CM | POA: Diagnosis not present

## 2019-04-12 ENCOUNTER — Ambulatory Visit (INDEPENDENT_AMBULATORY_CARE_PROVIDER_SITE_OTHER): Payer: Medicare Other | Admitting: *Deleted

## 2019-04-12 DIAGNOSIS — Z87891 Personal history of nicotine dependence: Secondary | ICD-10-CM | POA: Diagnosis not present

## 2019-04-12 DIAGNOSIS — L97311 Non-pressure chronic ulcer of right ankle limited to breakdown of skin: Secondary | ICD-10-CM | POA: Diagnosis not present

## 2019-04-12 DIAGNOSIS — I110 Hypertensive heart disease with heart failure: Secondary | ICD-10-CM | POA: Diagnosis not present

## 2019-04-12 DIAGNOSIS — I495 Sick sinus syndrome: Secondary | ICD-10-CM | POA: Diagnosis not present

## 2019-04-12 DIAGNOSIS — Z951 Presence of aortocoronary bypass graft: Secondary | ICD-10-CM | POA: Diagnosis not present

## 2019-04-12 DIAGNOSIS — I5032 Chronic diastolic (congestive) heart failure: Secondary | ICD-10-CM | POA: Diagnosis not present

## 2019-04-12 DIAGNOSIS — L97321 Non-pressure chronic ulcer of left ankle limited to breakdown of skin: Secondary | ICD-10-CM | POA: Diagnosis not present

## 2019-04-12 DIAGNOSIS — I2510 Atherosclerotic heart disease of native coronary artery without angina pectoris: Secondary | ICD-10-CM | POA: Diagnosis not present

## 2019-04-12 DIAGNOSIS — I482 Chronic atrial fibrillation, unspecified: Secondary | ICD-10-CM | POA: Diagnosis not present

## 2019-04-12 DIAGNOSIS — Z7901 Long term (current) use of anticoagulants: Secondary | ICD-10-CM | POA: Diagnosis not present

## 2019-04-12 DIAGNOSIS — I872 Venous insufficiency (chronic) (peripheral): Secondary | ICD-10-CM | POA: Diagnosis not present

## 2019-04-12 DIAGNOSIS — Z5181 Encounter for therapeutic drug level monitoring: Secondary | ICD-10-CM | POA: Diagnosis not present

## 2019-04-12 DIAGNOSIS — Z950 Presence of cardiac pacemaker: Secondary | ICD-10-CM | POA: Diagnosis not present

## 2019-04-12 LAB — CUP PACEART REMOTE DEVICE CHECK
Battery Remaining Longevity: 37 mo
Battery Remaining Percentage: 25 %
Battery Voltage: 2.8 V
Brady Statistic RV Percent Paced: 84 %
Date Time Interrogation Session: 20200603062029
Implantable Lead Implant Date: 20111025
Implantable Lead Implant Date: 20111025
Implantable Lead Location: 753859
Implantable Lead Location: 753860
Implantable Pulse Generator Implant Date: 20111025
Lead Channel Impedance Value: 890 Ohm
Lead Channel Pacing Threshold Amplitude: 0.5 V
Lead Channel Pacing Threshold Pulse Width: 0.4 ms
Lead Channel Sensing Intrinsic Amplitude: 12 mV
Lead Channel Setting Pacing Amplitude: 0.75 V
Lead Channel Setting Pacing Pulse Width: 0.4 ms
Lead Channel Setting Sensing Sensitivity: 2 mV
Pulse Gen Model: 2210
Pulse Gen Serial Number: 7178130

## 2019-04-20 ENCOUNTER — Encounter: Payer: Self-pay | Admitting: Cardiology

## 2019-04-20 NOTE — Progress Notes (Signed)
Remote pacemaker transmission.   

## 2019-05-16 DIAGNOSIS — I2510 Atherosclerotic heart disease of native coronary artery without angina pectoris: Secondary | ICD-10-CM | POA: Diagnosis not present

## 2019-05-16 DIAGNOSIS — Z4800 Encounter for change or removal of nonsurgical wound dressing: Secondary | ICD-10-CM | POA: Diagnosis not present

## 2019-05-16 DIAGNOSIS — I5032 Chronic diastolic (congestive) heart failure: Secondary | ICD-10-CM | POA: Diagnosis not present

## 2019-05-16 DIAGNOSIS — Z87891 Personal history of nicotine dependence: Secondary | ICD-10-CM | POA: Diagnosis not present

## 2019-05-16 DIAGNOSIS — S81802D Unspecified open wound, left lower leg, subsequent encounter: Secondary | ICD-10-CM | POA: Diagnosis not present

## 2019-05-16 DIAGNOSIS — I872 Venous insufficiency (chronic) (peripheral): Secondary | ICD-10-CM | POA: Diagnosis not present

## 2019-05-16 DIAGNOSIS — Z5181 Encounter for therapeutic drug level monitoring: Secondary | ICD-10-CM | POA: Diagnosis not present

## 2019-05-16 DIAGNOSIS — I110 Hypertensive heart disease with heart failure: Secondary | ICD-10-CM | POA: Diagnosis not present

## 2019-05-16 DIAGNOSIS — Z951 Presence of aortocoronary bypass graft: Secondary | ICD-10-CM | POA: Diagnosis not present

## 2019-05-16 DIAGNOSIS — S81801D Unspecified open wound, right lower leg, subsequent encounter: Secondary | ICD-10-CM | POA: Diagnosis not present

## 2019-05-16 DIAGNOSIS — I482 Chronic atrial fibrillation, unspecified: Secondary | ICD-10-CM | POA: Diagnosis not present

## 2019-05-16 DIAGNOSIS — Z950 Presence of cardiac pacemaker: Secondary | ICD-10-CM | POA: Diagnosis not present

## 2019-05-16 DIAGNOSIS — Z7901 Long term (current) use of anticoagulants: Secondary | ICD-10-CM | POA: Diagnosis not present

## 2019-06-19 DIAGNOSIS — Z87891 Personal history of nicotine dependence: Secondary | ICD-10-CM | POA: Diagnosis not present

## 2019-06-19 DIAGNOSIS — Z950 Presence of cardiac pacemaker: Secondary | ICD-10-CM | POA: Diagnosis not present

## 2019-06-19 DIAGNOSIS — Z5181 Encounter for therapeutic drug level monitoring: Secondary | ICD-10-CM | POA: Diagnosis not present

## 2019-06-19 DIAGNOSIS — S81802D Unspecified open wound, left lower leg, subsequent encounter: Secondary | ICD-10-CM | POA: Diagnosis not present

## 2019-06-19 DIAGNOSIS — Z4800 Encounter for change or removal of nonsurgical wound dressing: Secondary | ICD-10-CM | POA: Diagnosis not present

## 2019-06-19 DIAGNOSIS — S81801D Unspecified open wound, right lower leg, subsequent encounter: Secondary | ICD-10-CM | POA: Diagnosis not present

## 2019-06-19 DIAGNOSIS — I482 Chronic atrial fibrillation, unspecified: Secondary | ICD-10-CM | POA: Diagnosis not present

## 2019-06-19 DIAGNOSIS — I110 Hypertensive heart disease with heart failure: Secondary | ICD-10-CM | POA: Diagnosis not present

## 2019-06-19 DIAGNOSIS — I5032 Chronic diastolic (congestive) heart failure: Secondary | ICD-10-CM | POA: Diagnosis not present

## 2019-06-19 DIAGNOSIS — I2510 Atherosclerotic heart disease of native coronary artery without angina pectoris: Secondary | ICD-10-CM | POA: Diagnosis not present

## 2019-06-19 DIAGNOSIS — I872 Venous insufficiency (chronic) (peripheral): Secondary | ICD-10-CM | POA: Diagnosis not present

## 2019-06-19 DIAGNOSIS — Z7901 Long term (current) use of anticoagulants: Secondary | ICD-10-CM | POA: Diagnosis not present

## 2019-06-19 DIAGNOSIS — Z951 Presence of aortocoronary bypass graft: Secondary | ICD-10-CM | POA: Diagnosis not present

## 2019-06-26 DIAGNOSIS — M109 Gout, unspecified: Secondary | ICD-10-CM | POA: Diagnosis not present

## 2019-06-26 DIAGNOSIS — I5042 Chronic combined systolic (congestive) and diastolic (congestive) heart failure: Secondary | ICD-10-CM | POA: Diagnosis not present

## 2019-06-26 DIAGNOSIS — I2510 Atherosclerotic heart disease of native coronary artery without angina pectoris: Secondary | ICD-10-CM | POA: Diagnosis not present

## 2019-07-12 ENCOUNTER — Ambulatory Visit (INDEPENDENT_AMBULATORY_CARE_PROVIDER_SITE_OTHER): Payer: Medicare Other | Admitting: *Deleted

## 2019-07-12 DIAGNOSIS — I495 Sick sinus syndrome: Secondary | ICD-10-CM

## 2019-07-12 DIAGNOSIS — I509 Heart failure, unspecified: Secondary | ICD-10-CM

## 2019-07-13 LAB — CUP PACEART REMOTE DEVICE CHECK
Battery Remaining Longevity: 34 mo
Battery Remaining Percentage: 22 %
Battery Voltage: 2.78 V
Brady Statistic RV Percent Paced: 86 %
Date Time Interrogation Session: 20200902065222
Implantable Lead Implant Date: 20111025
Implantable Lead Implant Date: 20111025
Implantable Lead Location: 753859
Implantable Lead Location: 753860
Implantable Pulse Generator Implant Date: 20111025
Lead Channel Impedance Value: 860 Ohm
Lead Channel Pacing Threshold Amplitude: 0.5 V
Lead Channel Pacing Threshold Pulse Width: 0.4 ms
Lead Channel Sensing Intrinsic Amplitude: 12 mV
Lead Channel Setting Pacing Amplitude: 0.75 V
Lead Channel Setting Pacing Pulse Width: 0.4 ms
Lead Channel Setting Sensing Sensitivity: 2 mV
Pulse Gen Model: 2210
Pulse Gen Serial Number: 7178130

## 2019-07-24 DIAGNOSIS — L821 Other seborrheic keratosis: Secondary | ICD-10-CM | POA: Diagnosis not present

## 2019-07-24 DIAGNOSIS — L570 Actinic keratosis: Secondary | ICD-10-CM | POA: Diagnosis not present

## 2019-07-24 DIAGNOSIS — L304 Erythema intertrigo: Secondary | ICD-10-CM | POA: Diagnosis not present

## 2019-07-24 DIAGNOSIS — Z85828 Personal history of other malignant neoplasm of skin: Secondary | ICD-10-CM | POA: Diagnosis not present

## 2019-07-24 DIAGNOSIS — D044 Carcinoma in situ of skin of scalp and neck: Secondary | ICD-10-CM | POA: Diagnosis not present

## 2019-07-24 DIAGNOSIS — D0422 Carcinoma in situ of skin of left ear and external auricular canal: Secondary | ICD-10-CM | POA: Diagnosis not present

## 2019-07-28 NOTE — Progress Notes (Signed)
Remote pacemaker transmission.   

## 2019-08-07 DIAGNOSIS — R531 Weakness: Secondary | ICD-10-CM | POA: Diagnosis not present

## 2019-08-07 DIAGNOSIS — Z7901 Long term (current) use of anticoagulants: Secondary | ICD-10-CM | POA: Diagnosis not present

## 2019-08-07 DIAGNOSIS — Z23 Encounter for immunization: Secondary | ICD-10-CM | POA: Diagnosis not present

## 2019-08-07 DIAGNOSIS — R42 Dizziness and giddiness: Secondary | ICD-10-CM | POA: Diagnosis not present

## 2019-08-22 DIAGNOSIS — L570 Actinic keratosis: Secondary | ICD-10-CM | POA: Diagnosis not present

## 2019-08-22 DIAGNOSIS — D044 Carcinoma in situ of skin of scalp and neck: Secondary | ICD-10-CM | POA: Diagnosis not present

## 2019-08-22 DIAGNOSIS — D0422 Carcinoma in situ of skin of left ear and external auricular canal: Secondary | ICD-10-CM | POA: Diagnosis not present

## 2019-08-22 DIAGNOSIS — C44329 Squamous cell carcinoma of skin of other parts of face: Secondary | ICD-10-CM | POA: Diagnosis not present

## 2019-08-22 DIAGNOSIS — D0439 Carcinoma in situ of skin of other parts of face: Secondary | ICD-10-CM | POA: Diagnosis not present

## 2019-08-22 DIAGNOSIS — L820 Inflamed seborrheic keratosis: Secondary | ICD-10-CM | POA: Diagnosis not present

## 2019-08-22 DIAGNOSIS — Z85828 Personal history of other malignant neoplasm of skin: Secondary | ICD-10-CM | POA: Diagnosis not present

## 2019-09-08 DIAGNOSIS — I509 Heart failure, unspecified: Secondary | ICD-10-CM | POA: Diagnosis not present

## 2019-09-08 DIAGNOSIS — E876 Hypokalemia: Secondary | ICD-10-CM | POA: Diagnosis not present

## 2019-09-27 ENCOUNTER — Ambulatory Visit: Payer: Medicare Other | Admitting: Internal Medicine

## 2019-09-27 ENCOUNTER — Other Ambulatory Visit: Payer: Self-pay

## 2019-09-27 ENCOUNTER — Encounter: Payer: Self-pay | Admitting: Internal Medicine

## 2019-09-27 VITALS — BP 128/70 | HR 63 | Ht 60.0 in | Wt 177.0 lb

## 2019-09-27 DIAGNOSIS — I5032 Chronic diastolic (congestive) heart failure: Secondary | ICD-10-CM

## 2019-09-27 DIAGNOSIS — Z950 Presence of cardiac pacemaker: Secondary | ICD-10-CM

## 2019-09-27 DIAGNOSIS — I495 Sick sinus syndrome: Secondary | ICD-10-CM

## 2019-09-27 DIAGNOSIS — I482 Chronic atrial fibrillation, unspecified: Secondary | ICD-10-CM | POA: Diagnosis not present

## 2019-09-27 NOTE — Progress Notes (Signed)
HPI Mr. Eppersoncomes today for ongoing evaluation and management of his permanent pacemaker, and chronic atrial fibrillation. The patient is a very pleasant 85 year old man with the above problems along with diastolic heart failure and venous insufficiency. He has not had syncope and denies chest pain. He has chronic dyspnea with exertion but his mobility is limited secondary to his advanced age, arthritis, and other comorbidities. He has developed sores on his legs. He is taking metolazone twice a week.        Allergies    Allergies  Allergen Reactions  . Avelox [Moxifloxacin Hcl In Nacl] Other (See Comments)    Messed up lungs, Respiratory Distress   . Indomethacin Other (See Comments)    Renal insufficiency  . Levaquin [Levofloxacin] Other (See Comments)    Messed up lungs  . Lipitor [Atorvastatin] Nausea Only, Palpitations and Other (See Comments)    Irregular heart beat also  . Morphine And Related Other (See Comments)    Irregular heart beart  . Pravachol [Pravastatin Sodium] Nausea Only, Palpitations and Other (See Comments)    Irregular heart beat  . Aleve [Naproxen Sodium] Nausea And Vomiting and Other (See Comments)    GI Upset  . Clindamycin/Lincomycin Other (See Comments)    Severe acid reflux and stomach pain  . Norpace [Disopyramide Phosphate] Other (See Comments)    Urinary retention  . Tripelennamine   . Amlodipine Besylate Rash  . Azithromycin Rash  . Bactrim [Sulfamethoxazole-Trimethoprim] Rash  . Clinoril [Sulindac] Rash  . Codeine Other (See Comments)    unknown  . Digoxin Rash  . Lorazepam Other (See Comments)    unknown  . Oxycodone-Acetaminophen Rash  . Sulfonamide Derivatives Rash  . Uloric [Febuxostat] Other (See Comments)    unknown     Current Outpatient Medications  Medication Sig Dispense Refill  . acetaminophen (TYLENOL) 325 MG tablet Take 2 tablets (650 mg total) by mouth every 6 (six) hours as needed for mild pain.    Marland Kitchen  albuterol (PROVENTIL) (2.5 MG/3ML) 0.083% nebulizer solution Take 3 mLs (2.5 mg total) by nebulization every 2 (two) hours as needed for wheezing or shortness of breath. 75 mL 12  . allopurinol (ZYLOPRIM) 300 MG tablet Take 300 mg by mouth daily.     Marland Kitchen ALPRAZolam (XANAX) 0.5 MG tablet Take 1 tablet (0.5 mg total) by mouth at bedtime as needed for anxiety. For sleep 10 tablet 0  . aspirin 81 MG tablet Take 1 tablet (81 mg total) by mouth every other day. 30 tablet 5  . atenolol (TENORMIN) 50 MG tablet Take 1 tablet (50 mg total) by mouth daily. 30 tablet 5  . cholecalciferol 4000 units TABS Take 4,000 Units by mouth daily. 30 tablet 5  . clotrimazole-betamethasone (LOTRISONE) cream Apply 1 application topically 2 (two) times daily as needed (rash).   0  . colchicine 0.6 MG tablet Take 0.6 mg by mouth daily as needed (gout).     . feeding supplement, ENSURE ENLIVE, (ENSURE ENLIVE) LIQD Take 237 mLs by mouth 2 (two) times daily between meals. 60 Bottle 5  . furosemide (LASIX) 40 MG tablet Take 40 mg by mouth 2 (two) times daily.    Marland Kitchen gabapentin (NEURONTIN) 100 MG capsule Take 100 mg by mouth at bedtime as needed (nerve pain).     Marland Kitchen ipratropium-albuterol (DUONEB) 0.5-2.5 (3) MG/3ML SOLN Take 3 mLs by nebulization every 6 (six) hours as needed. 360 mL 5  . miconazole (MICOTIN) 2 % powder  Apply topically See admin instructions. Apply topically daily after bath - for rash    . mometasone-formoterol (DULERA) 200-5 MCG/ACT AERO Inhale 2 puffs into the lungs 2 (two) times daily. 13 g 0  . nitroGLYCERIN (NITROSTAT) 0.4 MG SL tablet Place 0.4 mg under the tongue every 5 (five) minutes as needed for chest pain.    Marland Kitchen nystatin (MYCOSTATIN) 100000 UNIT/ML suspension Take 5 mLs by mouth 2 (two) times daily. Swish and swallow  0  . potassium chloride SA (K-DUR,KLOR-CON) 20 MEQ tablet Take 2 tablets (40 mEq total) by mouth 2 (two) times daily. 12 tablet 0  . senna-docusate (SENOKOT-S) 8.6-50 MG tablet Take 2 tablets  by mouth at bedtime. 60 tablet 5  . warfarin (COUMADIN) 2.5 MG tablet Take 1 tablet (2.5 mg total) by mouth daily at 6 PM. Or as directed (Patient taking differently: Take 1.25-2.5 mg by mouth See admin instructions. Alternate taking 1 tablet one day and 1/2 tablet the next day)     No current facility-administered medications for this visit.      Past Medical History:  Diagnosis Date  . Arrhythmia    afib  . Arthritis    "back and hands" (09/26/2015)  . Basal cell carcinoma   . CHF (congestive heart failure) ([place])   . Coronary artery disease   . CVI (common variable immunodeficiency) ([place])   . DJD (degenerative joint disease)   . ED (erectile dysfunction)   . GERD (gastroesophageal reflux disease)   . Gout   . Heart attack ([place]) 1986  . Hyperlipidemia   . Hypertension   . IFG (impaired fasting glucose)   . Neuropathy   . Non Hodgkin's lymphoma ([place]) dx'd 2006  . PAC (premature atrial contraction)   . PE (pulmonary embolism) 2007   "when I was taking chemo"  . Peptic ulcer disease   . Pneumonia ~ 2007 X 2  . Squamous carcinoma   . Venous stasis ulcers ([facility])     ROS:   All systems reviewed and negative except as noted in the HPI.   Past Surgical History:  Procedure Laterality Date  . BACK SURGERY    . CARDIAC CATHETERIZATION  1990  . CATARACT EXTRACTION W/ INTRAOCULAR LENS  IMPLANT, BILATERAL Bilateral 10/2006 ~ 11/2006  . CORONARY ANGIOPLASTY    . CORONARY ARTERY BYPASS GRAFT  1990   CABG X3  . ELBOW SURGERY Left    "related to gout"  . INSERT / REPLACE / REMOVE PACEMAKER  08/2010  . KNEE ARTHROSCOPY Left   . LUMBAR DISC SURGERY     "had pinched nerve; had to make room for it"  . NASAL SINUS SURGERY  1980   "related to bacteria infection in sinus"  . SQUAMOUS CELL CARCINOMA EXCISION Right 09/12/2015   cheek  . TONSILLECTOMY  1972     Family History  Problem Relation Age of Onset  . Obesity Mother   . Lung disease Father   . Diabetes type II Sister   .  Heart disease Brother   . Diabetes type II Daughter      Social History   Socioeconomic History  . Marital status: Widowed    Spouse name: Not on file  . Number of children: Not on file  . Years of education: Not on file  . Highest education level: Not on file  Occupational History  . Not on file  Social Needs  . Financial resource strain: Not on file  . Food insecurity  Worry: Not on file    Inability: Not on file  . Transportation needs    Medical: Not on file    Non-medical: Not on file  Tobacco Use  . Smoking status: Former Smoker    Packs/day: 1.00    Years: 1.00    Pack years: 1.00    Types: Cigarettes  . Smokeless tobacco: Never Used  Substance and Sexual Activity  . Alcohol use: No  . Drug use: No  . Sexual activity: Never  Lifestyle  . Physical activity    Days per week: Not on file    Minutes per session: Not on file  . Stress: Not on file  Relationships  . Social Herbalist on phone: Not on file    Gets together: Not on file    Attends religious service: Not on file    Active member of club or organization: Not on file    Attends meetings of clubs or organizations: Not on file    Relationship status: Not on file  . Intimate partner violence    Fear of current or ex partner: Not on file    Emotionally abused: Not on file    Physically abused: Not on file    Forced sexual activity: Not on file  Other Topics Concern  . Not on file  Social History Narrative  . Not on file     BP 128/70   Pulse 63   Ht 5' (1.524 m)   Wt 177 lb (80.3 kg)   SpO2 98%   BMI 34.57 kg/m   Physical Exam:  Well appearing elderly man, NAD HEENT: Unremarkable Neck:  No JVD, no thyromegally Lymphatics:  No adenopathy Back:  No CVA tenderness Lungs:  Clear with no wheezes HEART:  Regular rate rhythm, no murmurs, no rubs, no clicks Abd:  soft, positive bowel sounds, no organomegally, no rebound, no guarding Ext:  2 plus pulses, no edema, no cyanosis,  no clubbing Skin:  No rashes no nodules Neuro:  CN II through XII intact, motor grossly intact  EKG - atrial fib with ventricular pacing  DEVICE  Normal device function.  See PaceArt for details.   Assess/Plan: 1. Atrial fib - his VR is well controlled. He will continue his current meds. 2. PPM - his [employer] single chamber PPM is working normally. We will recheck in several months. 3. HTN - his bp is well controlled. No change in his meds.   Mikle Bosworth.D.

## 2019-09-27 NOTE — Patient Instructions (Signed)
Medication Instructions:  Your physician recommends that you continue on your current medications as directed. Please refer to the Current Medication list given to you today.  Labwork: None ordered.  Testing/Procedures: None ordered.  Follow-Up: Your physician wants you to follow-up in: one year with Dr. Lovena Le.   You will receive a reminder letter in the mail two months in advance. If you don't receive a letter, please call our office to schedule the follow-up appointment.  Remote monitoring is used to monitor your Pacemaker from home. This monitoring reduces the number of office visits required to check your device to one time per year. It allows [place] to keep an eye on the functioning of your device to ensure it is working properly. You are scheduled for a device check from home on 10/11/2019. You may send your transmission at any time that day. If you have a wireless device, the transmission will be sent automatically. After your physician reviews your transmission, you will receive a postcard with your next transmission date.  Any Other Special Instructions Will Be Listed Below (If Applicable).  If you need a refill on your cardiac medications before your next appointment, please call your pharmacy.

## 2019-10-11 ENCOUNTER — Ambulatory Visit (INDEPENDENT_AMBULATORY_CARE_PROVIDER_SITE_OTHER): Payer: Medicare Other | Admitting: *Deleted

## 2019-10-11 DIAGNOSIS — I495 Sick sinus syndrome: Secondary | ICD-10-CM | POA: Diagnosis not present

## 2019-10-11 LAB — CUP PACEART REMOTE DEVICE CHECK
Battery Remaining Longevity: 29 mo
Battery Remaining Percentage: 19 %
Battery Voltage: 2.77 V
Brady Statistic RV Percent Paced: 93 %
Date Time Interrogation Session: 20201202022419
Implantable Lead Implant Date: 20111025
Implantable Lead Implant Date: 20111025
Implantable Lead Location: 753859
Implantable Lead Location: 753860
Implantable Pulse Generator Implant Date: 20111025
Lead Channel Impedance Value: 790 Ohm
Lead Channel Pacing Threshold Amplitude: 0.5 V
Lead Channel Pacing Threshold Pulse Width: 0.4 ms
Lead Channel Sensing Intrinsic Amplitude: 12 mV
Lead Channel Setting Pacing Amplitude: 0.75 V
Lead Channel Setting Pacing Pulse Width: 0.4 ms
Lead Channel Setting Sensing Sensitivity: 2 mV
Pulse Gen Model: 2210
Pulse Gen Serial Number: 7178130

## 2019-10-20 DIAGNOSIS — L308 Other specified dermatitis: Secondary | ICD-10-CM | POA: Diagnosis not present

## 2019-10-20 DIAGNOSIS — L0889 Other specified local infections of the skin and subcutaneous tissue: Secondary | ICD-10-CM | POA: Diagnosis not present

## 2019-10-20 DIAGNOSIS — Z85828 Personal history of other malignant neoplasm of skin: Secondary | ICD-10-CM | POA: Diagnosis not present

## 2019-10-25 DIAGNOSIS — H52223 Regular astigmatism, bilateral: Secondary | ICD-10-CM | POA: Diagnosis not present

## 2019-11-06 NOTE — Progress Notes (Signed)
PPM remote 

## 2019-11-16 DIAGNOSIS — H26492 Other secondary cataract, left eye: Secondary | ICD-10-CM | POA: Diagnosis not present

## 2019-11-16 DIAGNOSIS — H26491 Other secondary cataract, right eye: Secondary | ICD-10-CM | POA: Diagnosis not present

## 2019-11-23 DIAGNOSIS — D0461 Carcinoma in situ of skin of right upper limb, including shoulder: Secondary | ICD-10-CM | POA: Diagnosis not present

## 2019-11-23 DIAGNOSIS — L821 Other seborrheic keratosis: Secondary | ICD-10-CM | POA: Diagnosis not present

## 2019-11-23 DIAGNOSIS — Z85828 Personal history of other malignant neoplasm of skin: Secondary | ICD-10-CM | POA: Diagnosis not present

## 2019-11-23 DIAGNOSIS — L570 Actinic keratosis: Secondary | ICD-10-CM | POA: Diagnosis not present

## 2019-11-23 DIAGNOSIS — D0439 Carcinoma in situ of skin of other parts of face: Secondary | ICD-10-CM | POA: Diagnosis not present

## 2019-11-23 DIAGNOSIS — C44329 Squamous cell carcinoma of skin of other parts of face: Secondary | ICD-10-CM | POA: Diagnosis not present

## 2019-12-07 DIAGNOSIS — I2510 Atherosclerotic heart disease of native coronary artery without angina pectoris: Secondary | ICD-10-CM | POA: Diagnosis not present

## 2019-12-07 DIAGNOSIS — D6869 Other thrombophilia: Secondary | ICD-10-CM | POA: Diagnosis not present

## 2019-12-07 DIAGNOSIS — I5042 Chronic combined systolic (congestive) and diastolic (congestive) heart failure: Secondary | ICD-10-CM | POA: Diagnosis not present

## 2019-12-07 DIAGNOSIS — I872 Venous insufficiency (chronic) (peripheral): Secondary | ICD-10-CM | POA: Diagnosis not present

## 2019-12-25 DIAGNOSIS — R42 Dizziness and giddiness: Secondary | ICD-10-CM | POA: Diagnosis not present

## 2020-01-10 ENCOUNTER — Ambulatory Visit (INDEPENDENT_AMBULATORY_CARE_PROVIDER_SITE_OTHER): Payer: Medicare Other | Admitting: *Deleted

## 2020-01-10 DIAGNOSIS — I495 Sick sinus syndrome: Secondary | ICD-10-CM

## 2020-01-10 LAB — CUP PACEART REMOTE DEVICE CHECK
Battery Remaining Longevity: 23 mo
Battery Remaining Percentage: 15 %
Battery Voltage: 2.74 V
Brady Statistic RV Percent Paced: 96 %
Date Time Interrogation Session: 20210303025834
Implantable Lead Implant Date: 20111025
Implantable Lead Implant Date: 20111025
Implantable Lead Location: 753859
Implantable Lead Location: 753860
Implantable Pulse Generator Implant Date: 20111025
Lead Channel Impedance Value: 850 Ohm
Lead Channel Pacing Threshold Amplitude: 0.5 V
Lead Channel Pacing Threshold Pulse Width: 0.4 ms
Lead Channel Sensing Intrinsic Amplitude: 12 mV
Lead Channel Setting Pacing Amplitude: 0.75 V
Lead Channel Setting Pacing Pulse Width: 0.4 ms
Lead Channel Setting Sensing Sensitivity: 2 mV
Pulse Gen Model: 2210
Pulse Gen Serial Number: 7178130

## 2020-01-10 NOTE — Progress Notes (Signed)
PPM Remote  

## 2020-01-11 DIAGNOSIS — H26492 Other secondary cataract, left eye: Secondary | ICD-10-CM | POA: Diagnosis not present

## 2020-01-15 DIAGNOSIS — D0461 Carcinoma in situ of skin of right upper limb, including shoulder: Secondary | ICD-10-CM | POA: Diagnosis not present

## 2020-01-15 DIAGNOSIS — L570 Actinic keratosis: Secondary | ICD-10-CM | POA: Diagnosis not present

## 2020-01-15 DIAGNOSIS — Z85828 Personal history of other malignant neoplasm of skin: Secondary | ICD-10-CM | POA: Diagnosis not present

## 2020-01-15 DIAGNOSIS — C44629 Squamous cell carcinoma of skin of left upper limb, including shoulder: Secondary | ICD-10-CM | POA: Diagnosis not present

## 2020-01-15 DIAGNOSIS — L821 Other seborrheic keratosis: Secondary | ICD-10-CM | POA: Diagnosis not present

## 2020-01-26 DIAGNOSIS — H52223 Regular astigmatism, bilateral: Secondary | ICD-10-CM | POA: Diagnosis not present

## 2020-02-17 ENCOUNTER — Other Ambulatory Visit
Admission: RE | Admit: 2020-02-17 | Discharge: 2020-02-17 | Disposition: A | Discharge status: Home / Self Care | Payer: Medicare Other | Source: Ambulatory Visit | Attending: Nurse Practitioner | Admitting: Nurse Practitioner

## 2020-02-17 DIAGNOSIS — Z7901 Long term (current) use of anticoagulants: Secondary | ICD-10-CM | POA: Insufficient documentation

## 2020-02-17 DIAGNOSIS — I4891 Unspecified atrial fibrillation: Secondary | ICD-10-CM | POA: Diagnosis not present

## 2020-02-17 DIAGNOSIS — I5033 Acute on chronic diastolic (congestive) heart failure: Secondary | ICD-10-CM | POA: Diagnosis not present

## 2020-02-17 DIAGNOSIS — I5032 Chronic diastolic (congestive) heart failure: Secondary | ICD-10-CM | POA: Diagnosis not present

## 2020-02-17 LAB — PROTIME-INR
INR: 1.7 — ABNORMAL HIGH (ref 0.8–1.2)
Prothrombin Time: 19.4 seconds — ABNORMAL HIGH (ref 11.4–15.2)

## 2020-03-01 DIAGNOSIS — I517 Cardiomegaly: Secondary | ICD-10-CM | POA: Diagnosis not present

## 2020-03-01 DIAGNOSIS — J181 Lobar pneumonia, unspecified organism: Secondary | ICD-10-CM | POA: Diagnosis not present

## 2020-03-05 DIAGNOSIS — I5032 Chronic diastolic (congestive) heart failure: Secondary | ICD-10-CM | POA: Diagnosis not present

## 2020-03-05 DIAGNOSIS — I4891 Unspecified atrial fibrillation: Secondary | ICD-10-CM | POA: Diagnosis not present

## 2020-03-05 DIAGNOSIS — I5033 Acute on chronic diastolic (congestive) heart failure: Secondary | ICD-10-CM | POA: Diagnosis not present

## 2020-03-21 DIAGNOSIS — R062 Wheezing: Secondary | ICD-10-CM | POA: Diagnosis not present

## 2020-04-04 DIAGNOSIS — I5032 Chronic diastolic (congestive) heart failure: Secondary | ICD-10-CM | POA: Diagnosis not present

## 2020-04-04 DIAGNOSIS — I5033 Acute on chronic diastolic (congestive) heart failure: Secondary | ICD-10-CM | POA: Diagnosis not present

## 2020-04-04 DIAGNOSIS — I4891 Unspecified atrial fibrillation: Secondary | ICD-10-CM | POA: Diagnosis not present

## 2020-04-04 DIAGNOSIS — I10 Essential (primary) hypertension: Secondary | ICD-10-CM | POA: Diagnosis not present

## 2020-04-10 ENCOUNTER — Ambulatory Visit: Payer: Medicare Other | Admitting: *Deleted

## 2020-04-10 DIAGNOSIS — I495 Sick sinus syndrome: Secondary | ICD-10-CM

## 2020-04-10 DIAGNOSIS — I5032 Chronic diastolic (congestive) heart failure: Secondary | ICD-10-CM

## 2020-04-10 LAB — CUP PACEART REMOTE DEVICE CHECK
Battery Remaining Longevity: 16 mo
Battery Remaining Percentage: 10 %
Battery Voltage: 2.71 V
Brady Statistic RV Percent Paced: 94 %
Date Time Interrogation Session: 20210602022944
Implantable Lead Implant Date: 20111025
Implantable Lead Implant Date: 20111025
Implantable Lead Location: 753859
Implantable Lead Location: 753860
Implantable Pulse Generator Implant Date: 20111025
Lead Channel Impedance Value: 850 Ohm
Lead Channel Pacing Threshold Amplitude: 0.5 V
Lead Channel Pacing Threshold Pulse Width: 0.4 ms
Lead Channel Sensing Intrinsic Amplitude: 12 mV
Lead Channel Setting Pacing Amplitude: 0.75 V
Lead Channel Setting Pacing Pulse Width: 0.4 ms
Lead Channel Setting Sensing Sensitivity: 2 mV
Pulse Gen Model: 2210
Pulse Gen Serial Number: 7178130

## 2020-04-11 DIAGNOSIS — C44629 Squamous cell carcinoma of skin of left upper limb, including shoulder: Secondary | ICD-10-CM | POA: Diagnosis not present

## 2020-04-11 DIAGNOSIS — L821 Other seborrheic keratosis: Secondary | ICD-10-CM | POA: Diagnosis not present

## 2020-04-11 DIAGNOSIS — Z85828 Personal history of other malignant neoplasm of skin: Secondary | ICD-10-CM | POA: Diagnosis not present

## 2020-04-11 DIAGNOSIS — I8311 Varicose veins of right lower extremity with inflammation: Secondary | ICD-10-CM | POA: Diagnosis not present

## 2020-04-11 DIAGNOSIS — I8312 Varicose veins of left lower extremity with inflammation: Secondary | ICD-10-CM | POA: Diagnosis not present

## 2020-04-11 NOTE — Progress Notes (Signed)
Remote pacemaker transmission.   

## 2020-04-12 DIAGNOSIS — I5032 Chronic diastolic (congestive) heart failure: Secondary | ICD-10-CM | POA: Diagnosis not present

## 2020-04-12 DIAGNOSIS — I10 Essential (primary) hypertension: Secondary | ICD-10-CM | POA: Diagnosis not present

## 2020-04-12 DIAGNOSIS — I5033 Acute on chronic diastolic (congestive) heart failure: Secondary | ICD-10-CM | POA: Diagnosis not present

## 2020-04-17 DIAGNOSIS — M109 Gout, unspecified: Secondary | ICD-10-CM | POA: Diagnosis not present

## 2020-04-17 DIAGNOSIS — I951 Orthostatic hypotension: Secondary | ICD-10-CM | POA: Diagnosis not present

## 2020-04-17 DIAGNOSIS — I2510 Atherosclerotic heart disease of native coronary artery without angina pectoris: Secondary | ICD-10-CM | POA: Diagnosis not present

## 2020-04-17 DIAGNOSIS — I495 Sick sinus syndrome: Secondary | ICD-10-CM | POA: Diagnosis not present

## 2020-04-17 DIAGNOSIS — I482 Chronic atrial fibrillation, unspecified: Secondary | ICD-10-CM | POA: Diagnosis not present

## 2020-04-17 DIAGNOSIS — E876 Hypokalemia: Secondary | ICD-10-CM | POA: Diagnosis not present

## 2020-04-17 DIAGNOSIS — I5033 Acute on chronic diastolic (congestive) heart failure: Secondary | ICD-10-CM | POA: Diagnosis not present

## 2020-04-17 DIAGNOSIS — Z950 Presence of cardiac pacemaker: Secondary | ICD-10-CM | POA: Diagnosis not present

## 2020-04-17 DIAGNOSIS — I110 Hypertensive heart disease with heart failure: Secondary | ICD-10-CM | POA: Diagnosis not present

## 2020-04-17 DIAGNOSIS — I87312 Chronic venous hypertension (idiopathic) with ulcer of left lower extremity: Secondary | ICD-10-CM | POA: Diagnosis not present

## 2020-04-17 DIAGNOSIS — L97829 Non-pressure chronic ulcer of other part of left lower leg with unspecified severity: Secondary | ICD-10-CM | POA: Diagnosis not present

## 2020-04-17 DIAGNOSIS — I890 Lymphedema, not elsewhere classified: Secondary | ICD-10-CM | POA: Diagnosis not present

## 2020-04-17 DIAGNOSIS — M1990 Unspecified osteoarthritis, unspecified site: Secondary | ICD-10-CM | POA: Diagnosis not present

## 2020-04-17 DIAGNOSIS — I10 Essential (primary) hypertension: Secondary | ICD-10-CM | POA: Diagnosis not present

## 2020-04-17 DIAGNOSIS — I739 Peripheral vascular disease, unspecified: Secondary | ICD-10-CM | POA: Diagnosis not present

## 2020-04-17 DIAGNOSIS — R238 Other skin changes: Secondary | ICD-10-CM | POA: Diagnosis not present

## 2020-04-17 DIAGNOSIS — I5032 Chronic diastolic (congestive) heart failure: Secondary | ICD-10-CM | POA: Diagnosis not present

## 2020-04-17 DIAGNOSIS — J449 Chronic obstructive pulmonary disease, unspecified: Secondary | ICD-10-CM | POA: Diagnosis not present

## 2020-04-18 DIAGNOSIS — Z1389 Encounter for screening for other disorder: Secondary | ICD-10-CM | POA: Diagnosis not present

## 2020-04-18 DIAGNOSIS — I482 Chronic atrial fibrillation, unspecified: Secondary | ICD-10-CM | POA: Diagnosis not present

## 2020-04-18 DIAGNOSIS — Z0000 Encounter for general adult medical examination without abnormal findings: Secondary | ICD-10-CM | POA: Diagnosis not present

## 2020-04-18 DIAGNOSIS — I2699 Other pulmonary embolism without acute cor pulmonale: Secondary | ICD-10-CM | POA: Diagnosis not present

## 2020-04-18 DIAGNOSIS — I5042 Chronic combined systolic (congestive) and diastolic (congestive) heart failure: Secondary | ICD-10-CM | POA: Diagnosis not present

## 2020-04-18 DIAGNOSIS — I2510 Atherosclerotic heart disease of native coronary artery without angina pectoris: Secondary | ICD-10-CM | POA: Diagnosis not present

## 2020-05-01 DIAGNOSIS — I890 Lymphedema, not elsewhere classified: Secondary | ICD-10-CM | POA: Diagnosis not present

## 2020-05-05 ENCOUNTER — Other Ambulatory Visit: Payer: Self-pay

## 2020-05-05 ENCOUNTER — Encounter (HOSPITAL_COMMUNITY): Payer: Self-pay

## 2020-05-05 ENCOUNTER — Emergency Department (HOSPITAL_COMMUNITY): Payer: Medicare Other

## 2020-05-05 ENCOUNTER — Other Ambulatory Visit
Admission: RE | Admit: 2020-05-05 | Discharge: 2020-05-05 | Disposition: A | Discharge status: Home / Self Care | Payer: Medicare Other | Source: Ambulatory Visit | Attending: Nurse Practitioner | Admitting: Nurse Practitioner

## 2020-05-05 ENCOUNTER — Inpatient Hospital Stay (HOSPITAL_COMMUNITY)
Admission: EM | Admit: 2020-05-05 | Discharge: 2020-05-09 | DRG: 178 | Disposition: A | Discharge status: Home Health | Payer: Medicare Other | Attending: Internal Medicine | Admitting: Internal Medicine

## 2020-05-05 DIAGNOSIS — R531 Weakness: Secondary | ICD-10-CM

## 2020-05-05 DIAGNOSIS — Z20822 Contact with and (suspected) exposure to covid-19: Secondary | ICD-10-CM | POA: Diagnosis not present

## 2020-05-05 DIAGNOSIS — Z885 Allergy status to narcotic agent status: Secondary | ICD-10-CM

## 2020-05-05 DIAGNOSIS — E785 Hyperlipidemia, unspecified: Secondary | ICD-10-CM | POA: Diagnosis present

## 2020-05-05 DIAGNOSIS — J209 Acute bronchitis, unspecified: Secondary | ICD-10-CM | POA: Diagnosis present

## 2020-05-05 DIAGNOSIS — B9789 Other viral agents as the cause of diseases classified elsewhere: Secondary | ICD-10-CM | POA: Diagnosis present

## 2020-05-05 DIAGNOSIS — Z7951 Long term (current) use of inhaled steroids: Secondary | ICD-10-CM

## 2020-05-05 DIAGNOSIS — Z833 Family history of diabetes mellitus: Secondary | ICD-10-CM

## 2020-05-05 DIAGNOSIS — I10 Essential (primary) hypertension: Secondary | ICD-10-CM | POA: Diagnosis present

## 2020-05-05 DIAGNOSIS — R0902 Hypoxemia: Secondary | ICD-10-CM | POA: Diagnosis not present

## 2020-05-05 DIAGNOSIS — R0689 Other abnormalities of breathing: Secondary | ICD-10-CM | POA: Diagnosis not present

## 2020-05-05 DIAGNOSIS — I5042 Chronic combined systolic (congestive) and diastolic (congestive) heart failure: Secondary | ICD-10-CM | POA: Diagnosis present

## 2020-05-05 DIAGNOSIS — Z7982 Long term (current) use of aspirin: Secondary | ICD-10-CM

## 2020-05-05 DIAGNOSIS — Z7901 Long term (current) use of anticoagulants: Secondary | ICD-10-CM

## 2020-05-05 DIAGNOSIS — R0602 Shortness of breath: Secondary | ICD-10-CM | POA: Diagnosis not present

## 2020-05-05 DIAGNOSIS — Z9861 Coronary angioplasty status: Secondary | ICD-10-CM

## 2020-05-05 DIAGNOSIS — Z882 Allergy status to sulfonamides status: Secondary | ICD-10-CM

## 2020-05-05 DIAGNOSIS — J690 Pneumonitis due to inhalation of food and vomit: Principal | ICD-10-CM | POA: Diagnosis present

## 2020-05-05 DIAGNOSIS — R918 Other nonspecific abnormal finding of lung field: Secondary | ICD-10-CM | POA: Diagnosis not present

## 2020-05-05 DIAGNOSIS — I110 Hypertensive heart disease with heart failure: Secondary | ICD-10-CM | POA: Diagnosis present

## 2020-05-05 DIAGNOSIS — Z79899 Other long term (current) drug therapy: Secondary | ICD-10-CM

## 2020-05-05 DIAGNOSIS — Z950 Presence of cardiac pacemaker: Secondary | ICD-10-CM

## 2020-05-05 DIAGNOSIS — I4891 Unspecified atrial fibrillation: Secondary | ICD-10-CM | POA: Diagnosis not present

## 2020-05-05 DIAGNOSIS — Z86711 Personal history of pulmonary embolism: Secondary | ICD-10-CM

## 2020-05-05 DIAGNOSIS — J189 Pneumonia, unspecified organism: Secondary | ICD-10-CM | POA: Diagnosis present

## 2020-05-05 DIAGNOSIS — Z66 Do not resuscitate: Secondary | ICD-10-CM | POA: Diagnosis present

## 2020-05-05 DIAGNOSIS — I2510 Atherosclerotic heart disease of native coronary artery without angina pectoris: Secondary | ICD-10-CM | POA: Diagnosis present

## 2020-05-05 DIAGNOSIS — I482 Chronic atrial fibrillation, unspecified: Secondary | ICD-10-CM | POA: Diagnosis present

## 2020-05-05 DIAGNOSIS — Z87891 Personal history of nicotine dependence: Secondary | ICD-10-CM

## 2020-05-05 DIAGNOSIS — Z951 Presence of aortocoronary bypass graft: Secondary | ICD-10-CM

## 2020-05-05 DIAGNOSIS — M109 Gout, unspecified: Secondary | ICD-10-CM | POA: Diagnosis present

## 2020-05-05 DIAGNOSIS — I5032 Chronic diastolic (congestive) heart failure: Secondary | ICD-10-CM | POA: Diagnosis not present

## 2020-05-05 DIAGNOSIS — R5381 Other malaise: Secondary | ICD-10-CM | POA: Diagnosis present

## 2020-05-05 DIAGNOSIS — Z8249 Family history of ischemic heart disease and other diseases of the circulatory system: Secondary | ICD-10-CM

## 2020-05-05 DIAGNOSIS — Z881 Allergy status to other antibiotic agents status: Secondary | ICD-10-CM

## 2020-05-05 LAB — CBC WITH DIFFERENTIAL/PLATELET
Abs Immature Granulocytes: 0.16 10*3/uL — ABNORMAL HIGH (ref 0.00–0.07)
Basophils Absolute: 0 10*3/uL (ref 0.0–0.1)
Basophils Relative: 0 %
Eosinophils Absolute: 0.1 10*3/uL (ref 0.0–0.5)
Eosinophils Relative: 1 %
HCT: 42.3 % (ref 39.0–52.0)
Hemoglobin: 14.3 g/dL (ref 13.0–17.0)
Immature Granulocytes: 1 %
Lymphocytes Relative: 23 %
Lymphs Abs: 2.6 10*3/uL (ref 0.7–4.0)
MCH: 34.1 pg — ABNORMAL HIGH (ref 26.0–34.0)
MCHC: 33.8 g/dL (ref 30.0–36.0)
MCV: 101 fL — ABNORMAL HIGH (ref 80.0–100.0)
Monocytes Absolute: 0.9 10*3/uL (ref 0.1–1.0)
Monocytes Relative: 8 %
Neutro Abs: 7.5 10*3/uL (ref 1.7–7.7)
Neutrophils Relative %: 67 %
Platelets: 198 10*3/uL (ref 150–400)
RBC: 4.19 MIL/uL — ABNORMAL LOW (ref 4.22–5.81)
RDW: 14.8 % (ref 11.5–15.5)
WBC: 11.3 10*3/uL — ABNORMAL HIGH (ref 4.0–10.5)
nRBC: 0 % (ref 0.0–0.2)

## 2020-05-05 LAB — BASIC METABOLIC PANEL
Anion gap: 11 (ref 5–15)
Anion gap: 12 (ref 5–15)
BUN: 32 mg/dL — ABNORMAL HIGH (ref 8–23)
BUN: 30 mg/dL — ABNORMAL HIGH (ref 8–23)
CO2: 29 mmol/L (ref 22–32)
CO2: 25 mmol/L (ref 22–32)
Calcium: 9.3 mg/dL (ref 8.9–10.3)
Calcium: 9.8 mg/dL (ref 8.9–10.3)
Chloride: 98 mmol/L (ref 98–111)
Chloride: 101 mmol/L (ref 98–111)
Creatinine, Ser: 1.15 mg/dL (ref 0.61–1.24)
Creatinine, Ser: 1.07 mg/dL (ref 0.61–1.24)
GFR calc Af Amer: >60 mL/min (ref >60)
GFR calc Af Amer: >60 mL/min (ref >60)
GFR calc non Af Amer: 54 mL/min — ABNORMAL LOW (ref >60)
GFR calc non Af Amer: 59 mL/min — ABNORMAL LOW (ref >60)
Glucose, Bld: 69 mg/dL — ABNORMAL LOW (ref 70–99)
Glucose, Bld: 113 mg/dL — ABNORMAL HIGH (ref 70–99)
Potassium: 3.6 mmol/L (ref 3.5–5.1)
Potassium: 4.3 mmol/L (ref 3.5–5.1)
Sodium: 138 mmol/L (ref 135–145)
Sodium: 138 mmol/L (ref 135–145)

## 2020-05-05 LAB — TROPONIN I (HIGH SENSITIVITY): Troponin I (High Sensitivity): 40 ng/L — ABNORMAL HIGH (ref <18)

## 2020-05-05 LAB — CBC
HCT: 45.8 % (ref 39.0–52.0)
Hemoglobin: 15.1 g/dL (ref 13.0–17.0)
MCH: 33.9 pg (ref 26.0–34.0)
MCHC: 33 g/dL (ref 30.0–36.0)
MCV: 102.9 fL — ABNORMAL HIGH (ref 80.0–100.0)
Platelets: 212 10*3/uL (ref 150–400)
RBC: 4.45 MIL/uL (ref 4.22–5.81)
RDW: 14.5 % (ref 11.5–15.5)
WBC: 10.5 10*3/uL (ref 4.0–10.5)
nRBC: 0.2 % (ref 0.0–0.2)

## 2020-05-05 MED ORDER — SODIUM CHLORIDE 0.9% FLUSH
3.0000 mL | Freq: Once | INTRAVENOUS | Status: AC
  Administered 2020-05-06: 3 mL via INTRAVENOUS

## 2020-05-05 NOTE — ED Provider Notes (Signed)
TIME SEEN: 11:53 PM  CHIEF COMPLAINT: Generalized weakness, low-grade fever, shortness of breath, cough  HPI: Patient is a 86 year old male with history of atrial fibrillation on Coumadin, CAD, CHF, hypertension, hyperlipidemia, previous history of PE who presents to the emergency department with 1 day of generalized weakness where he is having a hard time even getting up out of bed to walk.  Son at bedside also reports low-grade fever taken by patient's other son last night.  Son at bedside is not sure what the temperature was.  States patient has had shortness of breath and nonproductive cough. He reports the patient aspirated food today. Patient was reports having chest burning but no tightness or pressure.  No falls.  No vomiting or diarrhea.  No numbness or focal weakness.  Lives at home alone.  Son at bedside reports patient had a chest x-ray at home that he was told showed pneumonia and he was instructed to come to the ED.  Patient has not had COVID-19 nor has he had his COVID-19 vaccinations.    ROS: See HPI Constitutional: Low-grade fever  Eyes: no drainage  ENT: no runny nose   Cardiovascular:   chest pain  Resp: SOB  GI: no vomiting GU: no dysuria Integumentary: no rash  Allergy: no hives  Musculoskeletal: no leg swelling  Neurological: no slurred speech ROS otherwise negative  PAST MEDICAL HISTORY/PAST SURGICAL HISTORY:  Past Medical History:  Diagnosis Date  . Arrhythmia    afib  . Arthritis    "back and hands" (09/26/2015)  . Basal cell carcinoma   . CHF (congestive heart failure) ([place])   . Coronary artery disease   . CVI (common variable immunodeficiency) ([place])   . DJD (degenerative joint disease)   . ED (erectile dysfunction)   . GERD (gastroesophageal reflux disease)   . Gout   . Heart attack ([place]) 1986  . Hyperlipidemia   . Hypertension   . IFG (impaired fasting glucose)   . Neuropathy   . Non Hodgkin's lymphoma ([place]) dx'd 2006  . PAC (premature atrial  contraction)   . PE (pulmonary embolism) 2007   "when I was taking chemo"  . Peptic ulcer disease   . Pneumonia ~ 2007 X 2  . Squamous carcinoma   . Venous stasis ulcers ([facility])     MEDICATIONS:  Prior to Admission medications   Medication Sig Start Date End Date Taking? Authorizing Provider  acetaminophen (TYLENOL) 325 MG tablet Take 2 tablets (650 mg total) by mouth every 6 (six) hours as needed for mild pain. 08/18/18   Rai, Ripudeep K, MD  albuterol (PROVENTIL) (2.5 MG/3ML) 0.083% nebulizer solution Take 3 mLs (2.5 mg total) by nebulization every 2 (two) hours as needed for wheezing or shortness of breath. 03/04/18   Hosie Poisson, MD  allopurinol (ZYLOPRIM) 300 MG tablet Take 300 mg by mouth daily.     [provider]  ALPRAZolam Duanne Moron) 0.5 MG tablet Take 1 tablet (0.5 mg total) by mouth at bedtime as needed for anxiety. For sleep 08/17/18   Mendel Corning, MD  aspirin 81 MG tablet Take 1 tablet (81 mg total) by mouth every other day. 04/07/18   Roxan Hockey, MD  atenolol (TENORMIN) 50 MG tablet Take 1 tablet (50 mg total) by mouth daily. 04/07/18   Roxan Hockey, MD  cholecalciferol 4000 units TABS Take 4,000 Units by mouth daily. 04/08/18   Roxan Hockey, MD  clotrimazole-betamethasone (LOTRISONE) cream Apply 1 application topically 2 (two) times daily as needed (  rash).  08/11/17   [provider]  colchicine 0.6 MG tablet Take 0.6 mg by mouth daily as needed (gout).     [provider]  feeding supplement, ENSURE ENLIVE, (ENSURE ENLIVE) LIQD Take 237 mLs by mouth 2 (two) times daily between meals. 04/07/18   Roxan Hockey, MD  furosemide (LASIX) 40 MG tablet Take 40 mg by mouth 2 (two) times daily. 09/21/19   [provider]  gabapentin (NEURONTIN) 100 MG capsule Take 100 mg by mouth at bedtime as needed (nerve pain).     [provider]  ipratropium-albuterol (DUONEB) 0.5-2.5 (3) MG/3ML SOLN Take 3 mLs by nebulization every 6 (six)  hours as needed. 03/04/18   Hosie Poisson, MD  miconazole (MICOTIN) 2 % powder Apply topically See admin instructions. Apply topically daily after bath - for rash    [provider]  mometasone-formoterol (DULERA) 200-5 MCG/ACT AERO Inhale 2 puffs into the lungs 2 (two) times daily. 03/04/18   Hosie Poisson, MD  nitroGLYCERIN (NITROSTAT) 0.4 MG SL tablet Place 0.4 mg under the tongue every 5 (five) minutes as needed for chest pain.    [provider]  nystatin (MYCOSTATIN) 100000 UNIT/ML suspension Take 5 mLs by mouth 2 (two) times daily. Swish and swallow 03/08/18   [provider]  potassium chloride SA (K-DUR,KLOR-CON) 20 MEQ tablet Take 2 tablets (40 mEq total) by mouth 2 (two) times daily. 08/18/18   Rai, Ripudeep K, MD  senna-docusate (SENOKOT-S) 8.6-50 MG tablet Take 2 tablets by mouth at bedtime. 04/07/18   Roxan Hockey, MD  warfarin (COUMADIN) 2.5 MG tablet Take 1 tablet (2.5 mg total) by mouth daily at 6 PM. Or as directed Patient taking differently: Take 1.25-2.5 mg by mouth See admin instructions. Alternate taking 1 tablet one day and 1/2 tablet the next day 03/05/18   Hosie Poisson, MD    ALLERGIES:  Allergies  Allergen Reactions  . Avelox [Moxifloxacin Hcl In Nacl] Other (See Comments)    Messed up lungs, Respiratory Distress   . Indomethacin Other (See Comments)    Renal insufficiency  . Levaquin [Levofloxacin] Other (See Comments)    Messed up lungs  . Lipitor [Atorvastatin] Nausea Only, Palpitations and Other (See Comments)    Irregular heart beat also  . Morphine And Related Other (See Comments)    Irregular heart beart  . Pravachol [Pravastatin Sodium] Nausea Only, Palpitations and Other (See Comments)    Irregular heart beat  . Aleve [Naproxen Sodium] Nausea And Vomiting and Other (See Comments)    GI Upset  . Clindamycin/Lincomycin Other (See Comments)    Severe acid reflux and stomach pain  . Norpace [Disopyramide Phosphate] Other (See  Comments)    Urinary retention  . Tripelennamine   . Amlodipine Besylate Rash  . Azithromycin Rash  . Bactrim [Sulfamethoxazole-Trimethoprim] Rash  . Clinoril [Sulindac] Rash  . Codeine Other (See Comments)    unknown  . Digoxin Rash  . Lorazepam Other (See Comments)    unknown  . Oxycodone-Acetaminophen Rash  . Sulfonamide Derivatives Rash  . Uloric [Febuxostat] Other (See Comments)    unknown    SOCIAL HISTORY:  Social History   Tobacco Use  . Smoking status: Former Smoker    Packs/day: 1.00    Years: 1.00    Pack years: 1.00    Types: Cigarettes  . Smokeless tobacco: Never Used  Substance Use Topics  . Alcohol use: No    FAMILY HISTORY: Family History  Problem Relation Age of  Onset  . Obesity Mother   . Lung disease Father   . Diabetes type II Sister   . Heart disease Brother   . Diabetes type II Daughter     EXAM: BP (!) 158/74 (BP Location: Right Arm)   Pulse 65   Temp 98.9 F (37.2 C) (Oral)   Resp (!) 22   SpO2 90%  CONSTITUTIONAL: Alert and oriented and responds appropriately to questions.  Elderly, nontoxic appearing HEAD: Normocephalic EYES: Conjunctivae clear, pupils appear equal, EOM appear intact ENT: normal nose; dry appearing mucous membranes NECK: Supple, normal ROM CARD: Irregularly irregular and rate controlled; S1 and S2 appreciated; no murmurs, no clicks, no rubs, no gallops RESP: Normal chest excursion without splinting or tachypnea; breath sounds clear and equal bilaterally; no wheezes, no rhonchi, no rales, no hypoxia or respiratory distress, speaking full sentences ABD/GI: Normal bowel sounds; non-distended; soft, non-tender, no rebound, no guarding, no peritoneal signs, no hepatosplenomegaly BACK:  The back appears normal EXT: Normal ROM in all joints; no deformity noted, no edema; no cyanosis, no calf tenderness or calf swelling SKIN: Normal color for age and race; warm; no rash on exposed skin NEURO: Moves all extremities equally,  reports normal sensation diffusely, no drift, cranial nerves II through XII intact, normal speech PSYCH: The patient's mood and manner are appropriate.   MEDICAL DECISION MAKING: Patient here with generalized weakness, low-grade fever, cough, atypical chest pain and shortness of breath.  Differential includes ACS, PE, pneumonia, COVID-19, UTI, dehydration, anemia, electrolyte derangement.  Chest x-ray obtained in triage reviewed/interpreted and shows no acute abnormality.  EKG shows paced rhythm.  We will interrogate his pacemaker.  First troponin slightly elevated at 40 with no old high-sensitivity troponin for comparison.  Will recheck second troponin.  He is not having any active chest pain at this time.  Will give full dose aspirin.  Otherwise labs reassuring.  Will check rectal temperature and obtain urinalysis with culture.  He has no focal neurologic deficits at this time to suggest stroke.  Will give gentle IV hydration as he does appear dry on exam.  Anticipate admission given family reports he is not able to walk at all due to his generalized weakness which is abnormal for him.  ED PROGRESS: Second troponin is 43.  Feel this is less likely ACS given flat troponins.  Rectal temperature of 99.4.  We will proceed with CTA of the chest for further evaluation given they were told today he had pneumonia and he states he is having cough and shortness of breath and has remote history of PE.  PE however is less likely given therapeutic INR currently.  Patient is Covid negative.  Urine shows no sign of infection.   CT scan shows no PE.  He does have mild patchy and groundglass opacities dependent lower lobes likely aspiration given his history.  Will cover with Unasyn and discuss with medicine for admission.  No hypoxia here in the ED.  Patient to be admitted by Dr. Marcello Moores.  I have recommended admission and patient (and family if present) agree with this plan. Admitting physician will place admission  orders.   I reviewed all nursing notes, vitals, pertinent previous records and reviewed/interpreted all EKGs, lab and urine results, imaging (as available).     EKG Interpretation  Date/Time:  Sunday May 05 2020 21:04:25 EDT Ventricular Rate:  67 PR Interval:    QRS Duration: 138 QT Interval:  504 QTC Calculation: 532 R Axis:   -81 Text Interpretation:  Atrial fibrillation with frequent ventricular-paced complexes Left axis deviation Non-specific intra-ventricular conduction block Minimal voltage criteria for LVH, may be normal variant ( Cornell product ) Cannot rule out Septal infarct , age undetermined Possible Lateral infarct , age undetermined T wave abnormality, consider inferior ischemia Abnormal ECG Similar to previous EKGs Confirmed by Pryor Curia [phone]) on 05/05/2020 11:53:05 PM           [name] was evaluated in Emergency Department on 05/05/2020 for the symptoms described in the history of present illness. He was evaluated in the context of the global COVID-19 pandemic, which necessitated consideration that the patient might be at risk for infection with the SARS-CoV-2 virus that causes COVID-19. Institutional protocols and algorithms that pertain to the evaluation of patients at risk for COVID-19 are in a state of rapid change based on information released by regulatory bodies including the CDC and federal and state organizations. These policies and algorithms were followed during the patient's care in the ED.      [name], Delice Bison, DO 05/06/20 0530

## 2020-05-05 NOTE — ED Triage Notes (Signed)
To triage via EMS.  Pt dx with aspiration pneumonia.  Onset couple days shortness of breath.  Pt reports weakness and not able to safely stay at home alone.  A&Ox4.   He was prescribed doxycycline and phenergan.

## 2020-05-06 ENCOUNTER — Emergency Department (HOSPITAL_COMMUNITY): Payer: Medicare Other

## 2020-05-06 DIAGNOSIS — J189 Pneumonia, unspecified organism: Secondary | ICD-10-CM | POA: Diagnosis present

## 2020-05-06 DIAGNOSIS — J209 Acute bronchitis, unspecified: Secondary | ICD-10-CM | POA: Diagnosis not present

## 2020-05-06 DIAGNOSIS — I482 Chronic atrial fibrillation, unspecified: Secondary | ICD-10-CM | POA: Diagnosis not present

## 2020-05-06 DIAGNOSIS — Z7901 Long term (current) use of anticoagulants: Secondary | ICD-10-CM | POA: Diagnosis not present

## 2020-05-06 DIAGNOSIS — Z885 Allergy status to narcotic agent status: Secondary | ICD-10-CM | POA: Diagnosis not present

## 2020-05-06 DIAGNOSIS — B9789 Other viral agents as the cause of diseases classified elsewhere: Secondary | ICD-10-CM | POA: Diagnosis not present

## 2020-05-06 DIAGNOSIS — Z87891 Personal history of nicotine dependence: Secondary | ICD-10-CM | POA: Diagnosis not present

## 2020-05-06 DIAGNOSIS — Z8249 Family history of ischemic heart disease and other diseases of the circulatory system: Secondary | ICD-10-CM | POA: Diagnosis not present

## 2020-05-06 DIAGNOSIS — Z833 Family history of diabetes mellitus: Secondary | ICD-10-CM | POA: Diagnosis not present

## 2020-05-06 DIAGNOSIS — Z950 Presence of cardiac pacemaker: Secondary | ICD-10-CM | POA: Diagnosis not present

## 2020-05-06 DIAGNOSIS — J690 Pneumonitis due to inhalation of food and vomit: Secondary | ICD-10-CM | POA: Diagnosis not present

## 2020-05-06 DIAGNOSIS — Z20822 Contact with and (suspected) exposure to covid-19: Secondary | ICD-10-CM | POA: Diagnosis not present

## 2020-05-06 DIAGNOSIS — I5042 Chronic combined systolic (congestive) and diastolic (congestive) heart failure: Secondary | ICD-10-CM | POA: Diagnosis not present

## 2020-05-06 DIAGNOSIS — Z66 Do not resuscitate: Secondary | ICD-10-CM | POA: Diagnosis not present

## 2020-05-06 DIAGNOSIS — Z86711 Personal history of pulmonary embolism: Secondary | ICD-10-CM | POA: Diagnosis not present

## 2020-05-06 DIAGNOSIS — Z9861 Coronary angioplasty status: Secondary | ICD-10-CM | POA: Diagnosis not present

## 2020-05-06 DIAGNOSIS — Z951 Presence of aortocoronary bypass graft: Secondary | ICD-10-CM | POA: Diagnosis not present

## 2020-05-06 DIAGNOSIS — R5381 Other malaise: Secondary | ICD-10-CM | POA: Diagnosis not present

## 2020-05-06 DIAGNOSIS — Z7951 Long term (current) use of inhaled steroids: Secondary | ICD-10-CM | POA: Diagnosis not present

## 2020-05-06 DIAGNOSIS — Z882 Allergy status to sulfonamides status: Secondary | ICD-10-CM | POA: Diagnosis not present

## 2020-05-06 DIAGNOSIS — I110 Hypertensive heart disease with heart failure: Secondary | ICD-10-CM | POA: Diagnosis not present

## 2020-05-06 DIAGNOSIS — Z881 Allergy status to other antibiotic agents status: Secondary | ICD-10-CM | POA: Diagnosis not present

## 2020-05-06 DIAGNOSIS — Z7982 Long term (current) use of aspirin: Secondary | ICD-10-CM | POA: Diagnosis not present

## 2020-05-06 DIAGNOSIS — I2510 Atherosclerotic heart disease of native coronary artery without angina pectoris: Secondary | ICD-10-CM | POA: Diagnosis not present

## 2020-05-06 DIAGNOSIS — R0602 Shortness of breath: Secondary | ICD-10-CM | POA: Diagnosis not present

## 2020-05-06 DIAGNOSIS — M109 Gout, unspecified: Secondary | ICD-10-CM | POA: Diagnosis present

## 2020-05-06 DIAGNOSIS — I10 Essential (primary) hypertension: Secondary | ICD-10-CM | POA: Diagnosis not present

## 2020-05-06 LAB — COMPREHENSIVE METABOLIC PANEL
ALT: 31 U/L (ref 0–44)
AST: 31 U/L (ref 15–41)
Albumin: 2.8 g/dL — ABNORMAL LOW (ref 3.5–5.0)
Alkaline Phosphatase: 65 U/L (ref 38–126)
Anion gap: 8 (ref 5–15)
BUN: 27 mg/dL — ABNORMAL HIGH (ref 8–23)
CO2: 29 mmol/L (ref 22–32)
Calcium: 9.3 mg/dL (ref 8.9–10.3)
Chloride: 102 mmol/L (ref 98–111)
Creatinine, Ser: 1.11 mg/dL (ref 0.61–1.24)
GFR calc Af Amer: >60 mL/min (ref >60)
GFR calc non Af Amer: 56 mL/min — ABNORMAL LOW (ref >60)
Glucose, Bld: 106 mg/dL — ABNORMAL HIGH (ref 70–99)
Potassium: 3.7 mmol/L (ref 3.5–5.1)
Sodium: 139 mmol/L (ref 135–145)
Total Bilirubin: 1 mg/dL (ref 0.3–1.2)
Total Protein: 6.1 g/dL — ABNORMAL LOW (ref 6.5–8.1)

## 2020-05-06 LAB — CBC
HCT: 43.5 % (ref 39.0–52.0)
Hemoglobin: 14 g/dL (ref 13.0–17.0)
MCH: 33.3 pg (ref 26.0–34.0)
MCHC: 32.2 g/dL (ref 30.0–36.0)
MCV: 103.6 fL — ABNORMAL HIGH (ref 80.0–100.0)
Platelets: 196 10*3/uL (ref 150–400)
RBC: 4.2 MIL/uL — ABNORMAL LOW (ref 4.22–5.81)
RDW: 14.5 % (ref 11.5–15.5)
WBC: 10.2 10*3/uL (ref 4.0–10.5)
nRBC: 0 % (ref 0.0–0.2)

## 2020-05-06 LAB — URINALYSIS, ROUTINE W REFLEX MICROSCOPIC
Bilirubin Urine: NEGATIVE
Glucose, UA: NEGATIVE mg/dL
Hgb urine dipstick: NEGATIVE
Ketones, ur: NEGATIVE mg/dL
Leukocytes,Ua: NEGATIVE
Nitrite: NEGATIVE
Protein, ur: NEGATIVE mg/dL
Specific Gravity, Urine: 1.006 (ref 1.005–1.030)
pH: 7 (ref 5.0–8.0)

## 2020-05-06 LAB — PROTIME-INR
INR: 2.1 — ABNORMAL HIGH (ref 0.8–1.2)
Prothrombin Time: 23.1 seconds — ABNORMAL HIGH (ref 11.4–15.2)

## 2020-05-06 LAB — TROPONIN I (HIGH SENSITIVITY): Troponin I (High Sensitivity): 43 ng/L — ABNORMAL HIGH (ref <18)

## 2020-05-06 LAB — HIV ANTIBODY (ROUTINE TESTING W REFLEX): HIV Screen 4th Generation wRfx: NONREACTIVE

## 2020-05-06 LAB — SARS CORONAVIRUS 2 BY RT PCR (HOSPITAL ORDER, PERFORMED IN ~~LOC~~ HOSPITAL LAB): SARS Coronavirus 2: NEGATIVE

## 2020-05-06 LAB — STREP PNEUMONIAE URINARY ANTIGEN: Strep Pneumo Urinary Antigen: NEGATIVE

## 2020-05-06 MED ORDER — SODIUM CHLORIDE 0.9 % IV SOLN
3.0000 g | Freq: Once | INTRAVENOUS | Status: AC
  Administered 2020-05-06: 3 g via INTRAVENOUS
  Filled 2020-05-06: qty 8

## 2020-05-06 MED ORDER — POTASSIUM CHLORIDE CRYS ER 10 MEQ PO TBCR
40.0000 meq | EXTENDED_RELEASE_TABLET | Freq: Two times a day (BID) | ORAL | Status: DC
  Administered 2020-05-06 – 2020-05-09 (×7): 40 meq via ORAL
  Filled 2020-05-06 (×8): qty 4

## 2020-05-06 MED ORDER — COLCHICINE 0.6 MG PO TABS: 0.6000 mg | ORAL_TABLET | Freq: Every day | ORAL | Status: DC | PRN

## 2020-05-06 MED ORDER — ALPRAZOLAM 0.5 MG PO TABS
0.5000 mg | ORAL_TABLET | Freq: Every evening | ORAL | Status: DC | PRN
  Administered 2020-05-06 – 2020-05-08 (×3): 0.5 mg via ORAL
  Filled 2020-05-06 (×3): qty 1

## 2020-05-06 MED ORDER — WARFARIN - PHYSICIAN DOSING INPATIENT: Freq: Every day | Status: DC

## 2020-05-06 MED ORDER — WARFARIN 1.25 MG HALF TABLET
1.2500 mg | ORAL_TABLET | ORAL | Status: DC
  Administered 2020-05-06 – 2020-05-08 (×2): 1.25 mg via ORAL
  Filled 2020-05-06 (×3): qty 1

## 2020-05-06 MED ORDER — NITROGLYCERIN 0.4 MG SL SUBL: 0.4000 mg | SUBLINGUAL_TABLET | SUBLINGUAL | Status: DC | PRN

## 2020-05-06 MED ORDER — MOMETASONE FURO-FORMOTEROL FUM 200-5 MCG/ACT IN AERO
2.0000 | INHALATION_SPRAY | Freq: Two times a day (BID) | RESPIRATORY_TRACT | Status: DC
  Administered 2020-05-06 – 2020-05-09 (×6): 2 via RESPIRATORY_TRACT
  Filled 2020-05-06: qty 8.8

## 2020-05-06 MED ORDER — ALBUTEROL SULFATE (2.5 MG/3ML) 0.083% IN NEBU
2.5000 mg | INHALATION_SOLUTION | Freq: Four times a day (QID) | RESPIRATORY_TRACT | Status: DC
  Administered 2020-05-06 (×2): 2.5 mg via RESPIRATORY_TRACT
  Filled 2020-05-06 (×2): qty 3

## 2020-05-06 MED ORDER — ASPIRIN 81 MG PO CHEW
81.0000 mg | CHEWABLE_TABLET | ORAL | Status: DC
  Administered 2020-05-06 – 2020-05-08 (×2): 81 mg via ORAL
  Filled 2020-05-06 (×3): qty 1

## 2020-05-06 MED ORDER — FUROSEMIDE 40 MG PO TABS
40.0000 mg | ORAL_TABLET | Freq: Two times a day (BID) | ORAL | Status: DC
  Administered 2020-05-06 – 2020-05-09 (×7): 40 mg via ORAL
  Filled 2020-05-06 (×4): qty 1
  Filled 2020-05-06: qty 2
  Filled 2020-05-06 (×2): qty 1

## 2020-05-06 MED ORDER — WARFARIN SODIUM 2.5 MG PO TABS
2.5000 mg | ORAL_TABLET | ORAL | Status: DC
  Administered 2020-05-07: 2.5 mg via ORAL
  Filled 2020-05-06 (×3): qty 1

## 2020-05-06 MED ORDER — IOHEXOL 350 MG/ML SOLN
100.0000 mL | Freq: Once | INTRAVENOUS | Status: AC | PRN
  Administered 2020-05-06: 100 mL via INTRAVENOUS

## 2020-05-06 MED ORDER — ALBUTEROL SULFATE (2.5 MG/3ML) 0.083% IN NEBU
2.5000 mg | INHALATION_SOLUTION | Freq: Two times a day (BID) | RESPIRATORY_TRACT | Status: DC
  Administered 2020-05-07 – 2020-05-09 (×5): 2.5 mg via RESPIRATORY_TRACT
  Filled 2020-05-06 (×5): qty 3

## 2020-05-06 MED ORDER — WARFARIN 1.25 MG HALF TABLET: 1.2500 mg | ORAL_TABLET | ORAL | Status: DC

## 2020-05-06 MED ORDER — ALLOPURINOL 300 MG PO TABS
300.0000 mg | ORAL_TABLET | Freq: Every day | ORAL | Status: DC
  Administered 2020-05-06 – 2020-05-09 (×4): 300 mg via ORAL
  Filled 2020-05-06 (×4): qty 1

## 2020-05-06 MED ORDER — ATENOLOL 25 MG PO TABS
50.0000 mg | ORAL_TABLET | Freq: Every day | ORAL | Status: DC
  Administered 2020-05-06 – 2020-05-09 (×4): 50 mg via ORAL
  Filled 2020-05-06: qty 2
  Filled 2020-05-06: qty 1
  Filled 2020-05-06 (×2): qty 2

## 2020-05-06 MED ORDER — IPRATROPIUM-ALBUTEROL 0.5-2.5 (3) MG/3ML IN SOLN
3.0000 mL | Freq: Four times a day (QID) | RESPIRATORY_TRACT | Status: DC | PRN
  Administered 2020-05-09: 3 mL via RESPIRATORY_TRACT
  Filled 2020-05-06: qty 3

## 2020-05-06 MED ORDER — VITAMIN D 25 MCG (1000 UNIT) PO TABS
4000.0000 [IU] | ORAL_TABLET | Freq: Every day | ORAL | Status: DC
  Administered 2020-05-06 – 2020-05-09 (×4): 4000 [IU] via ORAL
  Filled 2020-05-06 (×4): qty 4

## 2020-05-06 MED ORDER — GABAPENTIN 100 MG PO CAPS: 100.0000 mg | ORAL_CAPSULE | Freq: Every evening | ORAL | Status: DC | PRN

## 2020-05-06 MED ORDER — SODIUM CHLORIDE 0.9 % IV SOLN
3.0000 g | Freq: Four times a day (QID) | INTRAVENOUS | Status: DC
  Administered 2020-05-06 – 2020-05-08 (×7): 3 g via INTRAVENOUS
  Filled 2020-05-06: qty 3
  Filled 2020-05-06 (×3): qty 8
  Filled 2020-05-06: qty 3
  Filled 2020-05-06: qty 0.15
  Filled 2020-05-06: qty 3
  Filled 2020-05-06: qty 8
  Filled 2020-05-06: qty 0.15
  Filled 2020-05-06: qty 3

## 2020-05-06 MED ORDER — SODIUM CHLORIDE 0.9 % IV BOLUS (SEPSIS)
500.0000 mL | Freq: Once | INTRAVENOUS | Status: AC
  Administered 2020-05-06: 500 mL via INTRAVENOUS

## 2020-05-06 MED ORDER — PREDNISONE 20 MG PO TABS
40.0000 mg | ORAL_TABLET | Freq: Every day | ORAL | Status: DC
  Administered 2020-05-06 – 2020-05-08 (×3): 40 mg via ORAL
  Filled 2020-05-06 (×3): qty 2

## 2020-05-06 MED ORDER — ASPIRIN 81 MG PO CHEW
324.0000 mg | CHEWABLE_TABLET | Freq: Once | ORAL | Status: AC
  Administered 2020-05-06: 324 mg via ORAL
  Filled 2020-05-06: qty 4

## 2020-05-06 NOTE — H&P (Addendum)
History and Physical    [name] SWH:[id number] DOB: 1923/03/16 DOA: 05/05/2020  PCP: Lavone Orn, MD  Patient coming from: home ( lives alone0  I have personally briefly reviewed patient's old medical records in [place]  Chief Complaint: cough, sob , weakness  HPI: [name] is a 86 y.o. male with medical history significant of    of atrial fibrillation on Coumadin, CAD, CHF, hypertension, hyperlipidemia, previous history of PE who presents to the emergency department complaints of weakness. Patient has interim history cough,and mild sob x 1 week. He was treated for bronchitis by his pcp with steroids and antibiotics. His symptoms however persisted and patient was noted to very weak this am with inability to get of bed. Due to this patient has had home xray that was concerning for PNA and due to this patient was referred to ed. Per son patient also has an episode of aspiration the night before.  On further ros , patient also noted to had nausea, poor po intake, diarrhea, as well as increase gerd symptoms. Patient denies dysuria but has had come difficulty passing his urine. Patient denies any chest pain, any fever, chills , but notes cold intolerance. Patient has not had palpitations or presyncope , no falls at home. Per son patient lives alone but has daily visits by caregivers.  ED Course:  Temp 98.9, bP 158/74, hr 65, rr22 sat 90% on ra   Labs:  Wbc: 11.3, mcv 101 Na138, glu 69, cr 1.15  Ce 40>>43 YK:[id number]  Ekg: junctional rhythm ,LVH, no acute st-twave changes Inr:2.1 CT angio chest: IMPRESSION: 1. No pulmonary embolus. 2. Cardiomegaly post CABG. Contrast refluxes into the hepatic veins and IVC, consistent with elevated right heart pressures. 3. Bronchial thickening which is prominent in the lower lobes with areas of mucous plugging bronchial filling. Mild patchy and ground-glass opacities in the dependent lower lobes may be atelectasis or pneumonia,  including aspiration. 4. Heterogeneous pulmonary parenchyma, can be seen with small airways disease. 5. Chronic T10 compression fracture. Slight loss of height of T12 superior endplate has progressed from prior exam but is age indeterminate, likely chronic but technically age indeterminate.  Tx Asa/500cc ns x 2 Unasyn Review of Systems: As per HPI otherwise 10 point review of systems negative.   Past Medical History:  Diagnosis Date  . Arrhythmia    afib  . Arthritis    "back and hands" (09/26/2015)  . Basal cell carcinoma   . CHF (congestive heart failure) ([place])   . Coronary artery disease   . CVI (common variable immunodeficiency) ([place])   . DJD (degenerative joint disease)   . ED (erectile dysfunction)   . GERD (gastroesophageal reflux disease)   . Gout   . Heart attack ([place]) 1986  . Hyperlipidemia   . Hypertension   . IFG (impaired fasting glucose)   . Neuropathy   . Non Hodgkin's lymphoma ([place]) dx'd 2006  . PAC (premature atrial contraction)   . PE (pulmonary embolism) 2007   "when I was taking chemo"  . Peptic ulcer disease   . Pneumonia ~ 2007 X 2  . Squamous carcinoma   . Venous stasis ulcers ([facility])     Past Surgical History:  Procedure Laterality Date  . BACK SURGERY    . CARDIAC CATHETERIZATION  1990  . CATARACT EXTRACTION W/ INTRAOCULAR LENS  IMPLANT, BILATERAL Bilateral 10/2006 ~ 11/2006  . CORONARY ANGIOPLASTY    . [place]  CABG X3  . ELBOW SURGERY Left    "related to gout"  . INSERT / REPLACE / REMOVE PACEMAKER  08/2010  . KNEE ARTHROSCOPY Left   . LUMBAR DISC SURGERY     "had pinched nerve; had to make room for it"  . NASAL SINUS SURGERY  1980   "related to bacteria infection in sinus"  . SQUAMOUS CELL CARCINOMA EXCISION Right 09/12/2015   cheek  . TONSILLECTOMY  1972     reports that he has quit smoking. His smoking use included cigarettes. He has a 1.00 pack-year smoking history. He has never used smokeless tobacco.  He reports that he does not drink alcohol and does not use drugs.  Allergies  Allergen Reactions  . Avelox [Moxifloxacin Hcl In Nacl] Other (See Comments)    Messed up lungs, Respiratory Distress   . Indomethacin Other (See Comments)    Renal insufficiency  . Levaquin [Levofloxacin] Other (See Comments)    Messed up lungs  . Lipitor [Atorvastatin] Nausea Only, Palpitations and Other (See Comments)    Irregular heart beat also  . Morphine And Related Other (See Comments)    Irregular heart beart  . Pravachol [Pravastatin Sodium] Nausea Only, Palpitations and Other (See Comments)    Irregular heart beat  . Aleve [Naproxen Sodium] Nausea And Vomiting and Other (See Comments)    GI Upset  . Clindamycin/Lincomycin Other (See Comments)    Severe acid reflux and stomach pain  . Norpace [Disopyramide Phosphate] Other (See Comments)    Urinary retention  . Tripelennamine   . Amlodipine Besylate Rash  . Azithromycin Rash  . Bactrim [Sulfamethoxazole-Trimethoprim] Rash  . Clinoril [Sulindac] Rash  . Codeine Other (See Comments)    unknown  . Digoxin Rash  . Lorazepam Other (See Comments)    unknown  . Oxycodone-Acetaminophen Rash  . Sulfonamide Derivatives Rash  . Uloric [Febuxostat] Other (See Comments)    unknown    Family History  Problem Relation Age of Onset  . Obesity Mother   . Lung disease Father   . Diabetes type II Sister   . Heart disease Brother   . Diabetes type II Daughter     Prior to Admission medications   Medication Sig Start Date End Date Taking? Authorizing Provider  acetaminophen (TYLENOL) 325 MG tablet Take 2 tablets (650 mg total) by mouth every 6 (six) hours as needed for mild pain. 08/18/18  Yes Rai, Ripudeep K, MD  albuterol (PROVENTIL) (2.5 MG/3ML) 0.083% nebulizer solution Take 3 mLs (2.5 mg total) by nebulization every 2 (two) hours as needed for wheezing or shortness of breath. 03/04/18  Yes Hosie Poisson, MD  allopurinol (ZYLOPRIM) 300 MG tablet  Take 300 mg by mouth daily.    Yes [provider]  ALPRAZolam Duanne Moron) 0.5 MG tablet Take 1 tablet (0.5 mg total) by mouth at bedtime as needed for anxiety. For sleep 08/17/18  Yes Rai, Ripudeep K, MD  aspirin 81 MG tablet Take 1 tablet (81 mg total) by mouth every other day. 04/07/18  Yes Emokpae, Courage, MD  atenolol (TENORMIN) 50 MG tablet Take 1 tablet (50 mg total) by mouth daily. 04/07/18  Yes Emokpae, Courage, MD  cholecalciferol 4000 units TABS Take 4,000 Units by mouth daily. 04/08/18  Yes Emokpae, Courage, MD  clotrimazole-betamethasone (LOTRISONE) cream Apply 1 application topically 2 (two) times daily as needed (rash).  08/11/17  Yes [provider]  colchicine 0.6 MG tablet Take 0.6 mg by mouth daily as needed (gout).  Yes [provider]  doxycycline (VIBRA-TABS) 100 MG tablet Take 100 mg by mouth 2 (two) times daily. 04/29/20  Yes [provider]  furosemide (LASIX) 40 MG tablet Take 40 mg by mouth 2 (two) times daily. 09/21/19  Yes [provider]  gabapentin (NEURONTIN) 100 MG capsule Take 100 mg by mouth at bedtime as needed (nerve pain).    Yes [provider]  ipratropium-albuterol (DUONEB) 0.5-2.5 (3) MG/3ML SOLN Take 3 mLs by nebulization every 6 (six) hours as needed. 03/04/18  Yes Hosie Poisson, MD  mometasone-formoterol (DULERA) 200-5 MCG/ACT AERO Inhale 2 puffs into the lungs 2 (two) times daily. 03/04/18  Yes Hosie Poisson, MD  nitroGLYCERIN (NITROSTAT) 0.4 MG SL tablet Place 0.4 mg under the tongue every 5 (five) minutes as needed for chest pain.   Yes [provider]  potassium chloride (KLOR-CON) 10 MEQ tablet Take 40 mEq by mouth 2 (two) times daily. 04/13/20  Yes [provider]  senna-docusate (SENOKOT-S) 8.6-50 MG tablet Take 2 tablets by mouth at bedtime. 04/07/18  Yes Emokpae, Courage, MD  triamcinolone ointment (KENALOG) 0.1 % Apply 1 application topically 2 (two) times daily. 04/05/20  Yes [provider]  warfarin (COUMADIN) 2.5 MG tablet Take 1 tablet (2.5 mg total) by mouth daily at 6 PM. Or as directed Patient taking differently: Take 1.25-2.5 mg by mouth See admin instructions. Alternate taking 1 tablet one day and 1/2 tablet the next day 03/05/18  Yes Hosie Poisson, MD  feeding supplement, ENSURE ENLIVE, (ENSURE ENLIVE) LIQD Take 237 mLs by mouth 2 (two) times daily between meals. Patient not taking: Reported on 05/06/2020 04/07/18   Roxan Hockey, MD    Physical Exam: Vitals:   05/06/20 0100 05/06/20 0115 05/06/20 0214 05/06/20 0229  BP: (!) 146/76 (!) 142/64 129/67 (!) 141/65  Pulse: 63 60 68 65  Resp: (!) 23 20 18  (!) 23  Temp:      TempSrc:      SpO2: 95% 96% 95% 95%  Weight:      Height:        Constitutional: NAD, calm, comfortable Vitals:   05/06/20 0100 05/06/20 0115 05/06/20 0214 05/06/20 0229  BP: (!) 146/76 (!) 142/64 129/67 (!) 141/65  Pulse: 63 60 68 65  Resp: (!) 23 20 18  (!) 23  Temp:      TempSrc:      SpO2: 95% 96% 95% 95%  Weight:      Height:       Eyes: PERRL, lids and conjunctivae normal ENMT: Mucous membranes are moist. Posterior pharynx clear of any exudate or lesions.Normal dentition.  Neck: normal, supple, no masses, no thyromegaly Respiratory:  Bilaterally diminished , crackles on right, +rhonchi,exp wheezing,Normal respiratory effort. No accessory muscle use.  Cardiovascular: Regular rate and rhythm, no murmurs / rubs / gallops. No extremity edema No carotid bruits.  Abdomen: no tenderness, no masses palpated. No hepatosplenomegaly. Bowel sounds positive.  Musculoskeletal: no clubbing / cyanosis. No joint deformity upper and lower extremities. Good ROM, no contractures. Normal muscle tone.  Skin: no rashes, lesions, ulcers. No induration Neurologic: CN 2-12 grossly intact. Sensation intact,  Strength 5/5 in all 4.  Psychiatric: Normal judgment and insight. Alert and oriented x 3. Normal mood.    Labs on Admission: I have  personally reviewed following labs and imaging studies  CBC: Recent Labs  Lab 05/05/20 1115 05/05/20 2139  WBC 11.3* 10.5  NEUTROABS 7.5  --   HGB 14.3 15.1  HCT 42.3  45.8  MCV 101.0* 102.9*  PLT 198 469   Basic Metabolic Panel: Recent Labs  Lab 05/05/20 1115 05/05/20 2139  NA 138 138  K 3.6 4.3  CL 98 101  CO2 29 25  GLUCOSE 69* 113*  BUN 32* 30*  CREATININE 1.15 1.07  CALCIUM 9.3 9.8   GFR: Estimated Creatinine Clearance: 38 mL/min (by C-G formula based on SCr of 1.07 mg/dL). Liver Function Tests: No results for input(s): AST, ALT, ALKPHOS, BILITOT, PROT, ALBUMIN in the last 168 hours. No results for input(s): LIPASE, AMYLASE in the last 168 hours. No results for input(s): AMMONIA in the last 168 hours. Coagulation Profile: Recent Labs  Lab 05/06/20 0122  INR 2.1*   Cardiac Enzymes: No results for input(s): CKTOTAL, CKMB, CKMBINDEX, TROPONINI in the last 168 hours. BNP (last 3 results) No results for input(s): PROBNP in the last 8760 hours. HbA1C: No results for input(s): HGBA1C in the last 72 hours. CBG: No results for input(s): GLUCAP in the last 168 hours. Lipid Profile: No results for input(s): CHOL, HDL, LDLCALC, TRIG, CHOLHDL, LDLDIRECT in the last 72 hours. Thyroid Function Tests: No results for input(s): TSH, T4TOTAL, FREET4, T3FREE, THYROIDAB in the last 72 hours. Anemia Panel: No results for input(s): VITAMINB12, FOLATE, FERRITIN, TIBC, IRON, RETICCTPCT in the last 72 hours. Urine analysis:    Component Value Date/Time   COLORURINE STRAW (A) 05/06/2020 0007   APPEARANCEUR CLEAR 05/06/2020 0007   LABSPEC 1.006 05/06/2020 0007   PHURINE 7.0 05/06/2020 0007   GLUCOSEU NEGATIVE 05/06/2020 0007   HGBUR NEGATIVE 05/06/2020 0007   BILIRUBINUR NEGATIVE 05/06/2020 0007   KETONESUR NEGATIVE 05/06/2020 0007   PROTEINUR NEGATIVE 05/06/2020 0007   UROBILINOGEN 0.2 07/08/2015 0428   NITRITE NEGATIVE 05/06/2020 0007   LEUKOCYTESUR NEGATIVE 05/06/2020  0007    Radiological Exams on Admission: DG Chest 2 View  Result Date: 05/05/2020 CLINICAL DATA:  Short of breath, weakness EXAM: CHEST - 2 VIEW COMPARISON:  04/04/2018 FINDINGS: Frontal and lateral views of the chest demonstrate stable dual lead pacemaker. Postsurgical changes from median sternotomy are again noted. Cardiac silhouette is stable. Mild atherosclerosis of the aortic arch. Stable calcified granulomas within the right lower lobe. No acute airspace disease, effusion, or pneumothorax. IMPRESSION: 1. Stable exam, no acute process. Electronically Signed   By: Randa Ngo M.D.   On: 05/05/2020 21:57   CT Angio Chest PE W and/or Wo Contrast  Result Date: 05/06/2020 CLINICAL DATA:  86 year old with shortness of breath. History of pulmonary embolus. Aspiration pneumonia. EXAM: CT ANGIOGRAPHY CHEST WITH CONTRAST TECHNIQUE: Multidetector CT imaging of the chest was performed using the standard protocol during bolus administration of intravenous contrast. Multiplanar CT image reconstructions and MIPs were obtained to evaluate the vascular anatomy. CONTRAST:  185mL OMNIPAQUE IOHEXOL 350 MG/ML SOLN COMPARISON:  Chest CT 03/01/2018.  Radiograph yesterday. FINDINGS: Cardiovascular: There are no filling defects within the pulmonary arteries to suggest pulmonary embolus. Post median sternotomy and CABG. Dense calcification of native coronary arteries. Aortic atherosclerosis and tortuosity. Cannot assess for dissection given phase of contrast tailored to pulmonary artery evaluation. Left-sided pacemaker in place. Contrast refluxes into the hepatic veins and IVC. Mediastinum/Nodes: Shotty right greater than left hilar lymph nodes. Few prominent mediastinal lymph nodes are not enlarged by size criteria. Esophagus minimally patulous. No wall thickening. Lungs/Pleura: Heterogeneous pulmonary parenchyma. Bronchial thickening which is prominent in the lower lobes with areas of mucous plugging bronchial filling.  Mild patchy and ground-glass opacities in the dependent lower lobes. Calcified granulomas  in the right lower lobe. No pleural fluid. No pulmonary mass. Upper Abdomen: Contrast refluxes into the hepatic veins and IVC. Left renal cysts. Nonobstructing left renal stone versus vascular calcification. j Musculoskeletal: Median sternotomy. Diffuse degenerative change in the spine. Chronic T10 compression fracture. Slight loss of height of T12 superior endplate has progressed from prior exam but is age indeterminate, no soft tissue changes to suggest acute. Review of the MIP images confirms the above findings. IMPRESSION: 1. No pulmonary embolus. 2. Cardiomegaly post CABG. Contrast refluxes into the hepatic veins and IVC, consistent with elevated right heart pressures. 3. Bronchial thickening which is prominent in the lower lobes with areas of mucous plugging bronchial filling. Mild patchy and ground-glass opacities in the dependent lower lobes may be atelectasis or pneumonia, including aspiration. 4. Heterogeneous pulmonary parenchyma, can be seen with small airways disease. 5. Chronic T10 compression fracture. Slight loss of height of T12 superior endplate has progressed from prior exam but is age indeterminate, likely chronic but technically age indeterminate. Aortic Atherosclerosis (ICD10-I70.0). Electronically Signed   By: Keith Rake M.D.   On: 05/06/2020 03:05    EKG: Independently reviewed. As noted ablve  Assessment/Plan CAP/acute bronchitis -possible aspiration  - will place on broad spectrum abx  -nebs/prednisone/pulmonary toilet - f/u with urinary ag, sputum and blood culture  -speech and swallow eval   Abn CE in background of CAD s/p CABG -continue on atenolol, asa -not acute EKG tracing  -no complaints of chest pain -cycle ce  -doubt acs most likely demand related  -echo in am to be complete    Debility /generalized -due to acute illness -PT/OT  atrial fibrillation - on  Coumadin -ekg junctional rhythm currently     CHF -no current exacerbation  -resume home lasix    Hypertension - stable  -continue home medications   Hyperlipidemia - diet controlled   previous history of PE  -continue coumadin   FEN Stable replete lytes prn   DVT prophylaxis: inr 2.1 on coumadin Code Status:DNR/DNI Family Communication: n/a Disposition Plan:2-4 days  Consults called: n/a Admission status:cardiac tele   Clance Boll MD [facility]  If 7PM-7AM, please contact night-coverage [email or web address] Password [facility]  05/06/2020, 3:43 AM

## 2020-05-06 NOTE — ED Notes (Signed)
St jude pacemaker interrogated

## 2020-05-06 NOTE — Progress Notes (Signed)
Pharmacy Antibiotic Note  [name] is a 86 y.o. male admitted on 05/05/2020 with pneumonia.  Pharmacy has been consulted for Unasyn dosing.  Plan: Unasyn 3g IV Q6H.  Height: 5\' 2"  (157.5 cm) Weight: 80.7 kg (178 lb) IBW/kg (Calculated) : 54.6  Temp (24hrs), Avg:99.2 F (37.3 C), Min:98.9 F (37.2 C), Max:99.4 F (37.4 C)  Recent Labs  Lab 05/05/20 1115 05/05/20 2139  WBC 11.3* 10.5  CREATININE 1.15 1.07    Estimated Creatinine Clearance: 38 mL/min (by C-G formula based on SCr of 1.07 mg/dL).    Allergies  Allergen Reactions  . Avelox [Moxifloxacin Hcl In Nacl] Other (See Comments)    Messed up lungs, Respiratory Distress   . Indomethacin Other (See Comments)    Renal insufficiency  . Levaquin [Levofloxacin] Other (See Comments)    Messed up lungs  . Lipitor [Atorvastatin] Nausea Only, Palpitations and Other (See Comments)    Irregular heart beat also  . Morphine And Related Other (See Comments)    Irregular heart beart  . Pravachol [Pravastatin Sodium] Nausea Only, Palpitations and Other (See Comments)    Irregular heart beat  . Aleve [Naproxen Sodium] Nausea And Vomiting and Other (See Comments)    GI Upset  . Clindamycin/Lincomycin Other (See Comments)    Severe acid reflux and stomach pain  . Norpace [Disopyramide Phosphate] Other (See Comments)    Urinary retention  . Tripelennamine   . Amlodipine Besylate Rash  . Azithromycin Rash  . Bactrim [Sulfamethoxazole-Trimethoprim] Rash  . Clinoril [Sulindac] Rash  . Codeine Other (See Comments)    unknown  . Digoxin Rash  . Lorazepam Other (See Comments)    unknown  . Oxycodone-Acetaminophen Rash  . Sulfonamide Derivatives Rash  . Uloric [Febuxostat] Other (See Comments)    unknown    Thank you for allowing pharmacy to be a part of this patient's care.  Wynona Neat, PharmD, BCPS  05/06/2020 4:39 AM

## 2020-05-06 NOTE — ED Notes (Signed)
Patient transported to CT 

## 2020-05-06 NOTE — Progress Notes (Signed)
PROGRESS NOTE    [name]  XHB:[id number] DOB: February 20, 1923 DOA: 05/05/2020 PCP: Lavone Orn, MD  Outpatient Specialists:   Brief Narrative:  As per H and P done by Dr. Myles Rosenthal " [name] is a 86 y.o. male with medical history significant of   of atrial fibrillation on Coumadin, CAD, CHF, hypertension, hyperlipidemia, previous history of PE who presents to the emergency department complaints of weakness. Patient has interim history cough,and mild sob x 1 week. He was treated for bronchitis by his pcp with steroids and antibiotics. His symptoms however persisted and patient was noted to very weak this am with inability to get of bed. Due to this patient has had home xray that was concerning for PNA and due to this patient was referred to ed. Per son patient also has an episode of aspiration the night before.  On further ros , patient also noted to had nausea, poor po intake, diarrhea, as well as increase gerd symptoms. Patient denies dysuria but has had come difficulty passing his urine. Patient denies any chest pain, any fever, chills , but notes cold intolerance. Patient has not had palpitations or presyncope , no falls at home. Per son patient lives alone but has daily visits by caregivers.  ED Course:  Temp 98.9, bP 158/74, hr 65, rr22 sat 90% on ra"   Assessment & Plan:   Active Problems:   CAP (community acquired pneumonia)  CAP/acute bronchitis -possible aspiration  -Continue IV Unasyn -Follow culture results, and other work up - nebs/prednisone/pulmonary toilet -speech and swallow eval  Debility /generalized -PT/OT  atrial fibrillation - on Coumadin   CHF -Stable   Hypertension - stable  -continue home medications   Hyperlipidemia - diet controlled   previous history of PE  -continue coumadin    DVT prophylaxis: Warfarin Code Status: DNR Family Communication: Son Disposition Plan: Depends on hospital course   Consultants:     None  Procedures:   None  Antimicrobials:   IV Unasyn    Subjective: No SOB No Fever Feels weak Productive cough, thick and yellowish  No fever or chills  Objective: Vitals:   05/06/20 1300 05/06/20 1400 05/06/20 1500 05/06/20 1538  BP: (!) 146/95 (!) 155/69 (!) 141/59 (!) 141/77  Pulse: 66 66 63 64  Resp: 19 20 (!) 24 19  Temp:      TempSrc:      SpO2: 96% 97% 94% 95%  Weight:      Height:        Intake/Output Summary (Last 24 hours) at 05/06/2020 1619 Last data filed at 05/06/2020 1403 Gross per 24 hour  Intake 600 ml  Output 200 ml  Net 400 ml   Filed Weights   05/06/20 0015  Weight: 80.7 kg    Examination:  General exam: Appears calm and comfortable  Respiratory system: Expiratory wheeze right lung field anteriorly Cardiovascular system: S1 & S2  Gastrointestinal system: Abdomen is nondistended, soft and nontender. No organomegaly or masses felt. Normal bowel sounds heard. Central nervous system: Alert and oriented. Patient moves all extremities Extremities: No significant edema  Data Reviewed: I have personally reviewed following labs and imaging studies  CBC: Recent Labs  Lab 05/05/20 1115 05/05/20 2139 05/06/20 0438  WBC 11.3* 10.5 10.2  NEUTROABS 7.5  --   --   HGB 14.3 15.1 14.0  HCT 42.3 45.8 43.5  MCV 101.0* 102.9* 103.6*  PLT 198 212 810   Basic Metabolic Panel: Recent Labs  Lab  05/05/20 1115 05/05/20 2139 05/06/20 0438  NA 138 138 139  K 3.6 4.3 3.7  CL 98 101 102  CO2 29 25 29   GLUCOSE 69* 113* 106*  BUN 32* 30* 27*  CREATININE 1.15 1.07 1.11  CALCIUM 9.3 9.8 9.3   GFR: Estimated Creatinine Clearance: 36.6 mL/min (by C-G formula based on SCr of 1.11 mg/dL). Liver Function Tests: Recent Labs  Lab 05/06/20 0438  AST 31  ALT 31  ALKPHOS 65  BILITOT 1.0  PROT 6.1*  ALBUMIN 2.8*   No results for input(s): LIPASE, AMYLASE in the last 168 hours. No results for input(s): AMMONIA in the last 168 hours. Coagulation  Profile: Recent Labs  Lab 05/06/20 0122  INR 2.1*   Cardiac Enzymes: No results for input(s): CKTOTAL, CKMB, CKMBINDEX, TROPONINI in the last 168 hours. BNP (last 3 results) No results for input(s): PROBNP in the last 8760 hours. HbA1C: No results for input(s): HGBA1C in the last 72 hours. CBG: No results for input(s): GLUCAP in the last 168 hours. Lipid Profile: No results for input(s): CHOL, HDL, LDLCALC, TRIG, CHOLHDL, LDLDIRECT in the last 72 hours. Thyroid Function Tests: No results for input(s): TSH, T4TOTAL, FREET4, T3FREE, THYROIDAB in the last 72 hours. Anemia Panel: No results for input(s): VITAMINB12, FOLATE, FERRITIN, TIBC, IRON, RETICCTPCT in the last 72 hours. Urine analysis:    Component Value Date/Time   COLORURINE STRAW (A) 05/06/2020 0007   APPEARANCEUR CLEAR 05/06/2020 0007   LABSPEC 1.006 05/06/2020 0007   PHURINE 7.0 05/06/2020 0007   GLUCOSEU NEGATIVE 05/06/2020 0007   HGBUR NEGATIVE 05/06/2020 0007   BILIRUBINUR NEGATIVE 05/06/2020 0007   KETONESUR NEGATIVE 05/06/2020 0007   PROTEINUR NEGATIVE 05/06/2020 0007   UROBILINOGEN 0.2 07/08/2015 0428   NITRITE NEGATIVE 05/06/2020 0007   LEUKOCYTESUR NEGATIVE 05/06/2020 0007   Sepsis Labs: @LABRCNTIP (procalcitonin:4,lacticidven:4)  ) Recent Results (from the past 240 hour(s))  SARS Coronavirus 2 by RT PCR (hospital order, performed in [place] hospital lab) Nasopharyngeal Nasopharyngeal Swab     Status: None   Collection Time: 05/06/20 12:17 AM   Specimen: Nasopharyngeal Swab  Result Value Ref Range Status   SARS Coronavirus 2 NEGATIVE NEGATIVE Final    Comment: (NOTE) SARS-CoV-2 target nucleic acids are NOT DETECTED.  The SARS-CoV-2 RNA is generally detectable in upper and lower respiratory specimens during the acute phase of infection. The lowest concentration of SARS-CoV-2 viral copies this assay can detect is 250 copies / mL. A negative result does not preclude SARS-CoV-2 infection and should  not be used as the sole basis for treatment or other patient management decisions.  A negative result may occur with improper specimen collection / handling, submission of specimen other than nasopharyngeal swab, presence of viral mutation(s) within the areas targeted by this assay, and inadequate number of viral copies (<250 copies / mL). A negative result must be combined with clinical observations, patient history, and epidemiological information.  Fact Sheet for Patients:   StrictlyIdeas.no  Fact Sheet for Healthcare Providers: BankingDealers.co.za  This test is not yet approved or  cleared by the [place] FDA and has been authorized for detection and/or diagnosis of SARS-CoV-2 by FDA under an Emergency Use Authorization (EUA).  This EUA will remain in effect (meaning this test can be used) for the duration of the COVID-19 declaration under Section 564(b)(1) of the Act, 21 U.S.C. section 360bbb-3(b)(1), unless the authorization is terminated or revoked sooner.  Performed at [facility], [place] [address] [place], [place] [address]  Blood culture (routine x 2)     Status: None (Preliminary result)   Collection Time: 05/06/20  4:26 AM   Specimen: BLOOD RIGHT HAND  Result Value Ref Range Status   Specimen Description BLOOD RIGHT HAND  Final   Special Requests   Final    BOTTLES DRAWN AEROBIC AND ANAEROBIC Blood Culture adequate volume   Culture   Final    NO GROWTH < 12 HOURS Performed at [facility], [place] [address], [place], [place] [address]    Report Status PENDING  Incomplete  Blood culture (routine x 2)     Status: None (Preliminary result)   Collection Time: 05/06/20  4:36 AM   Specimen: BLOOD LEFT FOREARM  Result Value Ref Range Status   Specimen Description BLOOD LEFT FOREARM  Final   Special Requests   Final    BOTTLES DRAWN AEROBIC AND ANAEROBIC Blood Culture adequate volume   Culture   Final    NO  GROWTH < 12 HOURS Performed at [facility], [place] [address] [place], [place] [address]    Report Status PENDING  Incomplete         Radiology Studies: DG Chest 2 View  Result Date: 05/05/2020 CLINICAL DATA:  Short of breath, weakness EXAM: CHEST - 2 VIEW COMPARISON:  04/04/2018 FINDINGS: Frontal and lateral views of the chest demonstrate stable dual lead pacemaker. Postsurgical changes from median sternotomy are again noted. Cardiac silhouette is stable. Mild atherosclerosis of the aortic arch. Stable calcified granulomas within the right lower lobe. No acute airspace disease, effusion, or pneumothorax. IMPRESSION: 1. Stable exam, no acute process. Electronically Signed   By: Randa Ngo M.D.   On: 05/05/2020 21:57   CT Angio Chest PE W and/or Wo Contrast  Result Date: 05/06/2020 CLINICAL DATA:  86 year old with shortness of breath. History of pulmonary embolus. Aspiration pneumonia. EXAM: CT ANGIOGRAPHY CHEST WITH CONTRAST TECHNIQUE: Multidetector CT imaging of the chest was performed using the standard protocol during bolus administration of intravenous contrast. Multiplanar CT image reconstructions and MIPs were obtained to evaluate the vascular anatomy. CONTRAST:  128mL OMNIPAQUE IOHEXOL 350 MG/ML SOLN COMPARISON:  Chest CT 03/01/2018.  Radiograph yesterday. FINDINGS: Cardiovascular: There are no filling defects within the pulmonary arteries to suggest pulmonary embolus. Post median sternotomy and CABG. Dense calcification of native coronary arteries. Aortic atherosclerosis and tortuosity. Cannot assess for dissection given phase of contrast tailored to pulmonary artery evaluation. Left-sided pacemaker in place. Contrast refluxes into the hepatic veins and IVC. Mediastinum/Nodes: Shotty right greater than left hilar lymph nodes. Few prominent mediastinal lymph nodes are not enlarged by size criteria. Esophagus minimally patulous. No wall thickening. Lungs/Pleura: Heterogeneous  pulmonary parenchyma. Bronchial thickening which is prominent in the lower lobes with areas of mucous plugging bronchial filling. Mild patchy and ground-glass opacities in the dependent lower lobes. Calcified granulomas in the right lower lobe. No pleural fluid. No pulmonary mass. Upper Abdomen: Contrast refluxes into the hepatic veins and IVC. Left renal cysts. Nonobstructing left renal stone versus vascular calcification. j Musculoskeletal: Median sternotomy. Diffuse degenerative change in the spine. Chronic T10 compression fracture. Slight loss of height of T12 superior endplate has progressed from prior exam but is age indeterminate, no soft tissue changes to suggest acute. Review of the MIP images confirms the above findings. IMPRESSION: 1. No pulmonary embolus. 2. Cardiomegaly post CABG. Contrast refluxes into the hepatic veins and IVC, consistent with elevated right heart pressures. 3. Bronchial thickening which is prominent in the lower  lobes with areas of mucous plugging bronchial filling. Mild patchy and ground-glass opacities in the dependent lower lobes may be atelectasis or pneumonia, including aspiration. 4. Heterogeneous pulmonary parenchyma, can be seen with small airways disease. 5. Chronic T10 compression fracture. Slight loss of height of T12 superior endplate has progressed from prior exam but is age indeterminate, likely chronic but technically age indeterminate. Aortic Atherosclerosis (ICD10-I70.0). Electronically Signed   By: Keith Rake M.D.   On: 05/06/2020 03:05        Scheduled Meds: . albuterol  2.5 mg Nebulization Q6H  . allopurinol  300 mg Oral Daily  . aspirin  81 mg Oral QODAY  . atenolol  50 mg Oral Daily  . cholecalciferol  4,000 Units Oral Daily  . furosemide  40 mg Oral BID  . mometasone-formoterol  2 puff Inhalation BID  . potassium chloride  40 mEq Oral BID  . predniSONE  40 mg Oral Q breakfast  . warfarin  1.25 mg Oral Q48H  . [START ON 05/07/2020]  warfarin  2.5 mg Oral Q48H  . Warfarin - Physician Dosing Inpatient   Does not apply q1600   Continuous Infusions: . ampicillin-sulbactam (UNASYN) IV Stopped (05/06/20 1403)     LOS: 0 days    Time spent: 25 Minutes    Dana Allan, MD  [facility] Pager #: [phone] 7PM-7AM contact night coverage as above

## 2020-05-07 ENCOUNTER — Inpatient Hospital Stay (HOSPITAL_COMMUNITY): Payer: Medicare Other

## 2020-05-07 DIAGNOSIS — J690 Pneumonitis due to inhalation of food and vomit: Principal | ICD-10-CM

## 2020-05-07 LAB — CBC WITH DIFFERENTIAL/PLATELET
Abs Immature Granulocytes: 0.13 10*3/uL — ABNORMAL HIGH (ref 0.00–0.07)
Basophils Absolute: 0 10*3/uL (ref 0.0–0.1)
Basophils Relative: 0 %
Eosinophils Absolute: 0 10*3/uL (ref 0.0–0.5)
Eosinophils Relative: 0 %
HCT: 41.7 % (ref 39.0–52.0)
Hemoglobin: 13.8 g/dL (ref 13.0–17.0)
Immature Granulocytes: 1 %
Lymphocytes Relative: 21 %
Lymphs Abs: 2.1 10*3/uL (ref 0.7–4.0)
MCH: 33.8 pg (ref 26.0–34.0)
MCHC: 33.1 g/dL (ref 30.0–36.0)
MCV: 102.2 fL — ABNORMAL HIGH (ref 80.0–100.0)
Monocytes Absolute: 0.8 10*3/uL (ref 0.1–1.0)
Monocytes Relative: 8 %
Neutro Abs: 6.8 10*3/uL (ref 1.7–7.7)
Neutrophils Relative %: 70 %
Platelets: 204 10*3/uL (ref 150–400)
RBC: 4.08 MIL/uL — ABNORMAL LOW (ref 4.22–5.81)
RDW: 14.3 % (ref 11.5–15.5)
WBC: 9.9 10*3/uL (ref 4.0–10.5)
nRBC: 0.2 % (ref 0.0–0.2)

## 2020-05-07 LAB — LEGIONELLA PNEUMOPHILA SEROGP 1 UR AG: L. pneumophila Serogp 1 Ur Ag: NEGATIVE

## 2020-05-07 LAB — URINE CULTURE: Culture: NO GROWTH

## 2020-05-07 LAB — RESPIRATORY PANEL BY PCR

## 2020-05-07 LAB — PROTIME-INR
INR: 2.4 — ABNORMAL HIGH (ref 0.8–1.2)
Prothrombin Time: 25.4 seconds — ABNORMAL HIGH (ref 11.4–15.2)

## 2020-05-07 LAB — PROCALCITONIN: Procalcitonin: <0.1 ng/mL

## 2020-05-07 MED ORDER — RESOURCE THICKENUP CLEAR PO POWD
ORAL | Status: DC | PRN
  Filled 2020-05-07: qty 125

## 2020-05-07 NOTE — Progress Notes (Signed)
Occupational Therapy Evaluation Patient Details Name: [name] MRN: [id number] DOB: 1923/02/26 Today's Date: 05/07/2020    History of Present Illness [name] is a 86 y.o. male with medical history significant of atrial fibrillation on Coumadin, CAD, CHF, hypertension, hyperlipidemia, previous history of PE who presents to the emergency department complaints of weakness. Patient has interim history cough,and mild sob x 1 week. He was treated for bronchitis by his pcp with steroids and antibiotics. His symptoms however persisted and patient was noted to very weak this am with inability to get of bed. Due to this patient has had home xray that was concerning for PNA and due to this patient was referred to ed. Per son patient also has an episode of aspiration the night before.  On further ros , patient also noted to had nausea, poor po intake, diarrhea, as well as increase gerd symptoms. Patient denies dysuria but has had come difficulty passing his urine. Patient denies any chest pain, any fever, chills , but notes cold intolerance. Patient has not had palpitations or presyncope , no falls at home. Per son patient lives alone but has daily visits by caregivers    Clinical Impression   PTA, pt lives alone but has daily caregiver support and other family/friends that can assist when needed. Pt receives light assistance with most ADLs, but typically able to mobilize to bathroom with RW for toileting tasks without assistance. Pt presents mild deficits in strength, dynamic standing balance, and endurance that impact ability to function at prior level. However, pt does not appear far from baseline and progressed functionally throughout evaluation improving to min guard to short distance mobility with RW and transfers. Pt's son and caregiver Velva Harman) present and provided assist in bed mobility and LB dressing during evaluation. Pt has adequate caregiver support at home, safety-conscious, and motivated  to work with therapy. Recommend HHOT and 24/7 supervision at discharge. Will continue to follow acutely.    Follow Up Recommendations  Home health OT;Supervision/Assistance - 24 hour    Equipment Recommendations  None recommended by OT (has all needed equipment)    Recommendations for Other Services       Precautions / Restrictions Precautions Precautions: Fall Restrictions Weight Bearing Restrictions: No      Mobility Bed Mobility Overal bed mobility: Needs Assistance Bed Mobility: Supine to Sit;Sit to Supine     Supine to sit: Mod assist Sit to supine: Min guard   General bed mobility comments: Mod A to advance trunk with HHA from OT and son. Pt min guard to return to supine  Transfers Overall transfer level: Needs assistance Equipment used: Rolling walker (2 wheeled) Transfers: Sit to/from [facility] Sit to Stand: Min assist;Min guard Stand pivot transfers: Min guard       General transfer comment: Pt initially Min A for sit to stand transfer, improving to min guard with OT stabilizing RW. Pt min guard for stand pivot with RW    Balance Overall balance assessment: Needs assistance Sitting-balance support: Bilateral upper extremity supported;Feet supported Sitting balance-Leahy Scale: Fair     Standing balance support: Bilateral upper extremity supported;During functional activity Standing balance-Leahy Scale: Poor Standing balance comment: Pt reliant on UE support on RW                           ADL either performed or assessed with clinical judgement   ADL Overall ADL's : Needs assistance/impaired Eating/Feeding: Set up;Sitting  Grooming: Standing;Minimal assistance   Upper Body Bathing: Moderate assistance;Sitting   Lower Body Bathing: Moderate assistance;Sit to/from stand   Upper Body Dressing : Minimal assistance;Sitting   Lower Body Dressing: Sit to/from stand;Maximal assistance Lower Body Dressing Details (indicate  cue type and reason): Max A to don socks, shoes sitting EOB. Receives assistance for this at home Toilet Transfer: Min guard;RW;Ambulation   Toileting- Water quality scientist and Hygiene: Moderate assistance;Sit to/from stand       Functional mobility during ADLs: Minimal assistance;Min guard;Rolling walker General ADL Comments: Pt improved functionally throughout session and with wearing of tennis shoes. Pt requires assistance for ADLs at baseline and does not appear far from PLOF. Deficits include strength, endurance, and dynamic standing balance     Vision Baseline Vision/History: Wears glasses Wears Glasses: At all times Patient Visual Report: No change from baseline Vision Assessment?: No apparent visual deficits     Perception     Praxis      Pertinent Vitals/Pain Pain Assessment: No/denies pain     Hand Dominance Right   Extremity/Trunk Assessment Upper Extremity Assessment Upper Extremity Assessment: Generalized weakness   Lower Extremity Assessment Lower Extremity Assessment: Defer to PT evaluation       Communication Communication Communication: HOH   Cognition Arousal/Alertness: Awake/alert Behavior During Therapy: WFL for tasks assessed/performed Overall Cognitive Status: Within Functional Limits for tasks assessed                                     General Comments  VSS. Pt, family, and a caregiver present - all actively involved in session and assisting pt as needed.     Exercises     Shoulder Instructions      Home Living Family/patient expects to be discharged to:: Private residence Living Arrangements: Alone   Type of Home: House Home Access: Ramped entrance     [place]: One level     Bathroom Shower/Tub: Teacher, early years/pre: Handicapped height     Home Equipment: Environmental consultant - 2 wheels;Walker - 4 wheels;Grab bars - toilet;Wheelchair - manual;Hospital bed;Other (comment) (lift chair)          Prior  Functioning/Environment Level of Independence: Needs assistance  Gait / Transfers Assistance Needed: Typically able to mobilize with RW in the home  ADL's / Homemaking Assistance Needed: Daily caregivers  9-2, assist with bathing, dressing, house keeping and cooking. Pt typically able to toilet self and use urinal at night. Caregiver assists with sponge bathing - pt does not transfer to tub    Comments: Sleeps in lift chair, family uses wc for outside of the home        OT Problem List: Decreased strength;Decreased activity tolerance;Impaired balance (sitting and/or standing)      OT Treatment/Interventions: Therapeutic exercise;Self-care/ADL training;Energy conservation;DME and/or AE instruction;Therapeutic activities;Patient/family education    OT Goals(Current goals can be found in the care plan section) Acute Rehab OT Goals Patient Stated Goal: work with therapy, get stronger, walk better OT Goal Formulation: With patient/family Time For Goal Achievement: 05/21/20 Potential to Achieve Goals: Good ADL Goals Pt Will Perform Grooming: with set-up;standing Pt Will Transfer to Toilet: with supervision;ambulating;bedside commode;grab bars Pt Will Perform Toileting - Clothing Manipulation and hygiene: with supervision;sit to/from stand  OT Frequency: Min 2X/week   Barriers to D/C:            Co-evaluation  AM-PAC OT "6 Clicks" Daily Activity     Outcome Measure Help from another person eating meals?: A Little Help from another person taking care of personal grooming?: A Little Help from another person toileting, which includes using toliet, bedpan, or urinal?: A Little Help from another person bathing (including washing, rinsing, drying)?: A Lot Help from another person to put on and taking off regular upper body clothing?: A Little Help from another person to put on and taking off regular lower body clothing?: A Lot 6 Click Score: 16   End of Session Equipment  Utilized During Treatment: Gait belt;Rolling walker Nurse Communication: Mobility status;Other (comment) (recommend pt to wear shoes for all mobility)  Activity Tolerance: Patient tolerated treatment well Patient left: in bed;with call bell/phone within reach;with bed alarm set  OT Visit Diagnosis: Unsteadiness on feet (R26.81);Muscle weakness (generalized) (M62.81)                Time: 1010-1056 OT Time Calculation (min): 46 min Charges:  OT General Charges $OT Visit: 1 Visit OT Evaluation $OT Eval Moderate Complexity: 1 Mod OT Treatments $Self Care/Home Management : 8-22 mins $Therapeutic Activity: 8-22 mins  Layla Maw, OTR/L  Layla Maw 05/07/2020, 11:25 AM

## 2020-05-07 NOTE — Plan of Care (Signed)
  Problem: Clinical Measurements: Goal: Ability to maintain a body temperature in the normal range will improve Outcome: Progressing   Problem: Respiratory: Goal: Ability to maintain adequate ventilation will improve Outcome: Progressing Goal: Ability to maintain a clear airway will improve Outcome: Progressing   

## 2020-05-07 NOTE — Evaluation (Signed)
Clinical/Bedside Swallow Evaluation Patient Details  Name: [name] MRN: [id number] Date of Birth: Feb 20, 1923  Today's Date: 05/07/2020 Time: SLP Start Time (ACUTE ONLY): 78 SLP Stop Time (ACUTE ONLY): 1150 SLP Time Calculation (min) (ACUTE ONLY): 20 min  Past Medical History:  Past Medical History:  Diagnosis Date  . Arrhythmia    afib  . Arthritis    "back and hands" (09/26/2015)  . Basal cell carcinoma   . CHF (congestive heart failure) ([place])   . Coronary artery disease   . CVI (common variable immunodeficiency) ([place])   . DJD (degenerative joint disease)   . ED (erectile dysfunction)   . GERD (gastroesophageal reflux disease)   . Gout   . Heart attack ([place]) 1986  . Hyperlipidemia   . Hypertension   . IFG (impaired fasting glucose)   . Neuropathy   . Non Hodgkin's lymphoma ([place]) dx'd 2006  . PAC (premature atrial contraction)   . PE (pulmonary embolism) 2007   "when I was taking chemo"  . Peptic ulcer disease   . Pneumonia ~ 2007 X 2  . Squamous carcinoma   . Venous stasis ulcers ([facility])    Past Surgical History:  Past Surgical History:  Procedure Laterality Date  . BACK SURGERY    . CARDIAC CATHETERIZATION  1990  . CATARACT EXTRACTION W/ INTRAOCULAR LENS  IMPLANT, BILATERAL Bilateral 10/2006 ~ 11/2006  . CORONARY ANGIOPLASTY    . CORONARY ARTERY BYPASS GRAFT  1990   CABG X3  . ELBOW SURGERY Left    "related to gout"  . INSERT / REPLACE / REMOVE PACEMAKER  08/2010  . KNEE ARTHROSCOPY Left   . LUMBAR DISC SURGERY     "had pinched nerve; had to make room for it"  . NASAL SINUS SURGERY  1980   "related to bacteria infection in sinus"  . SQUAMOUS CELL CARCINOMA EXCISION Right 09/12/2015   cheek  . TONSILLECTOMY  1972   HPI:  [name] is a 86 y.o. male with medical history significant of atrial fibrillation on Coumadin, CAD, CHF, hypertension, hyperlipidemia, previous history of PE who presents to the emergency department complaints of weakness.  Patient has interim history cough,and mild sob x 1 week. He was treated for bronchitis by his pcp with steroids and antibiotics. His symptoms however persisted and patient was noted to very weak with inability to get of bed. Due to this patient has had home xray that was concerning for PNA. H/o dysphagia with prior need for thickened liquids.   Assessment / Plan / Recommendation Clinical Impression  Mr. Brodman demonstrates +s/s aspiration with lunch meal, specifically, with thin liquid intake. Pt with long history of coughing with meals, but now with PNA. Pt/family report he has coughed after PO intake for several years, but they do believe it is worsening. He has previously been recommended for thickened liquids at SNF, but reported he didn't need them anymore. No documentation of objective studies could be viewed. Oral mech exam was unremarkable with the exception of several missing teeth--partial in place, but some teeth still missing. He tolerated his soft solid lunch well, but coughed significantly with each trial of thin liquid. Family reports he coughs much worse with colder liquids versus room temperature, which is suspicious for esophageal phase deficits. Based on findings and CXR/CT recommend objective swallow study to assess swallowing physiology and create safest diet recommendation. Pt/family in agreement with plan. MBSS to be completed same date.   SLP Visit Diagnosis: Dysphagia, unspecified (R13.10)  Aspiration Risk  Moderate aspiration risk    Diet Recommendation Dysphagia 3 (Mech soft);Thin liquid   Liquid Administration via: Cup Medication Administration: Whole meds with puree Supervision: Comment (set up assist) Compensations: Small sips/bites;Follow solids with liquid Postural Changes: Seated upright at 90 degrees    Other  Recommendations Oral Care Recommendations: Oral care BID   Follow up Recommendations   pending results of MBSS     Frequency and Duration min  2x/week  1 week       Prognosis Prognosis for Safe Diet Advancement: Fair Barriers to Reach Goals: Time post onset      Swallow Study   General Date of Onset: 05/07/20 HPI: [name] is a 86 y.o. male with medical history significant of atrial fibrillation on Coumadin, CAD, CHF, hypertension, hyperlipidemia, previous history of PE who presents to the emergency department complaints of weakness. Patient has interim history cough,and mild sob x 1 week. He was treated for bronchitis by his pcp with steroids and antibiotics. His symptoms however persisted and patient was noted to very weak with inability to get of bed. Due to this patient has had home xray that was concerning for PNA. H/o dysphagia with prior need for thickened liquids. Type of Study: Bedside Swallow Evaluation Previous Swallow Assessment: CSE 2018 WFL; pt/family report history of thickened liquids, but "didn't need them anymore" Diet Prior to this Study: Regular;Thin liquids Temperature Spikes Noted: No Respiratory Status: Room air History of Recent Intubation: No Behavior/Cognition: Alert;Cooperative;Pleasant mood;Impulsive Oral Cavity Assessment: Within Functional Limits Oral Care Completed by SLP: Yes Oral Cavity - Dentition: Adequate natural dentition Vision: Functional for self-feeding Self-Feeding Abilities: Needs set up;Able to feed self Patient Positioning: Upright in bed Baseline Vocal Quality: Low vocal intensity Volitional Cough: Strong;Congested Volitional Swallow: Able to elicit    Oral/Motor/Sensory Function Overall Oral Motor/Sensory Function: Mild impairment Facial ROM: Within Functional Limits Facial Symmetry: Within Functional Limits Facial Strength: Within Functional Limits Facial Sensation: Within Functional Limits Lingual ROM: Within Functional Limits Lingual Symmetry: Within Functional Limits Lingual Strength: Reduced Lingual Sensation: Within Functional Limits Velum: Within Functional  Limits Mandible: Within Functional Limits   Thin Liquid Thin Liquid: Impaired Presentation: Cup;Straw Pharyngeal  Phase Impairments: Cough - Immediate    Solid     Solid: Within functional limits Presentation: [place]. Napolean Sia, M.S., CCC-SLP Speech-Language Pathologist Acute Rehabilitation Services Pager: [place] 05/07/2020,11:59 AM

## 2020-05-07 NOTE — Progress Notes (Signed)
PROGRESS NOTE    [name]  EVO:[id number] DOB: 1922-11-17 DOA: 05/05/2020 PCP: Lavone Orn, MD  Outpatient Specialists:   Brief Narrative:  Patient is a 86 year old Caucasian male with past medical history significant for atrial fibrillation on Coumadin, CAD, CHF, hypertension, hyperlipidemia, previous history of PE.  Patient presented with cough that is productive of yellowish sputum, mild shortness of breath and weakness.  No fever or chills.  Work-up done is suggestive of possible pneumonia, likely secondary to aspiration.  Patient is currently on IV Unasyn.  Patient is improving, however, continues to cough anytime he drinks.  Patient also has artificial dentition that may be interfering with his abilities to chew solids.  Speech evaluation is appreciated, dysphagia 3 diet and nectar thick liquid advised.  Patient is also supposed to use a straw and cup (see full speech evaluation).  Procalcitonin is less than 0.1.  Physical therapy and Occupational Therapy have recommended home with home health and 24-hour supervision.  Further management will depend on hospital course.  Patient will likely discharge in the next 24 to 48 hours.   Assessment & Plan:   Active Problems:   CAP (community acquired pneumonia)  CAP/acute bronchitis/possible aspiration pneumonia versus aspiration syndrome: -Speech evaluation is appreciated (nectar thick liquid, dysphagia 3 diet, use cup and straw for liquids) -Procalcitonin is less than 0.1 -Follow pending cultures -Continue IV Unasyn -Likely discharge in the next 24 to 48 hours.  Debility /generalized -PT/OT have recommended home with home health and 24-hour supervision.    atrial fibrillation - on Coumadin   CHF -Stable   Hypertension - stable  -continue home medications   Hyperlipidemia - diet controlled   previous history of PE  -continue coumadin    DVT prophylaxis: Warfarin Code Status: DNR Family Communication:  Son Disposition Plan: Depends on hospital course   Consultants:   None  Procedures:   None  Antimicrobials:   IV Unasyn    Subjective: No SOB No Fever Feels weak Productive cough, thick and yellowish  No fever or chills  Objective: Vitals:   05/06/20 2116 05/07/20 0734 05/07/20 0753 05/07/20 0757  BP: (!) 141/61 138/84    Pulse: 63 64    Resp:      Temp: 98.3 F (36.8 C) 98 F (36.7 C)    TempSrc: Oral Oral    SpO2: 96%  99% 99%  Weight:      Height:        Intake/Output Summary (Last 24 hours) at 05/07/2020 1203 Last data filed at 05/07/2020 0747 Gross per 24 hour  Intake 500 ml  Output 1225 ml  Net -725 ml   Filed Weights   05/06/20 0015  Weight: 80.7 kg    Examination:  General exam: Appears calm and comfortable  Respiratory system: Inspiratory and expiratory wheeze right lung field anteriorly Cardiovascular system: S1 & S2  Gastrointestinal system: Abdomen is nondistended, soft and nontender. No organomegaly or masses felt. Normal bowel sounds heard. Central nervous system: Alert and oriented. Patient moves all extremities Extremities: No significant edema  Data Reviewed: I have personally reviewed following labs and imaging studies  CBC: Recent Labs  Lab 05/05/20 1115 05/05/20 2139 05/06/20 0438 05/07/20 0311  WBC 11.3* 10.5 10.2 9.9  NEUTROABS 7.5  --   --  6.8  HGB 14.3 15.1 14.0 13.8  HCT 42.3 45.8 43.5 41.7  MCV 101.0* 102.9* 103.6* 102.2*  PLT 198 212 196 299   Basic Metabolic Panel: Recent Labs  Lab 05/05/20 1115  05/05/20 2139 05/06/20 0438  NA 138 138 139  K 3.6 4.3 3.7  CL 98 101 102  CO2 29 25 29   GLUCOSE 69* 113* 106*  BUN 32* 30* 27*  CREATININE 1.15 1.07 1.11  CALCIUM 9.3 9.8 9.3   GFR: Estimated Creatinine Clearance: 36.6 mL/min (by C-G formula based on SCr of 1.11 mg/dL). Liver Function Tests: Recent Labs  Lab 05/06/20 0438  AST 31  ALT 31  ALKPHOS 65  BILITOT 1.0  PROT 6.1*  ALBUMIN 2.8*   No  results for input(s): LIPASE, AMYLASE in the last 168 hours. No results for input(s): AMMONIA in the last 168 hours. Coagulation Profile: Recent Labs  Lab 05/06/20 0122 05/07/20 0311  INR 2.1* 2.4*   Cardiac Enzymes: No results for input(s): CKTOTAL, CKMB, CKMBINDEX, TROPONINI in the last 168 hours. BNP (last 3 results) No results for input(s): PROBNP in the last 8760 hours. HbA1C: No results for input(s): HGBA1C in the last 72 hours. CBG: No results for input(s): GLUCAP in the last 168 hours. Lipid Profile: No results for input(s): CHOL, HDL, LDLCALC, TRIG, CHOLHDL, LDLDIRECT in the last 72 hours. Thyroid Function Tests: No results for input(s): TSH, T4TOTAL, FREET4, T3FREE, THYROIDAB in the last 72 hours. Anemia Panel: No results for input(s): VITAMINB12, FOLATE, FERRITIN, TIBC, IRON, RETICCTPCT in the last 72 hours. Urine analysis:    Component Value Date/Time   COLORURINE STRAW (A) 05/06/2020 0007   APPEARANCEUR CLEAR 05/06/2020 0007   LABSPEC 1.006 05/06/2020 0007   PHURINE 7.0 05/06/2020 0007   GLUCOSEU NEGATIVE 05/06/2020 0007   HGBUR NEGATIVE 05/06/2020 0007   BILIRUBINUR NEGATIVE 05/06/2020 0007   KETONESUR NEGATIVE 05/06/2020 0007   PROTEINUR NEGATIVE 05/06/2020 0007   UROBILINOGEN 0.2 07/08/2015 0428   NITRITE NEGATIVE 05/06/2020 0007   LEUKOCYTESUR NEGATIVE 05/06/2020 0007   Sepsis Labs: @LABRCNTIP (procalcitonin:4,lacticidven:4)  ) Recent Results (from the past 240 hour(s))  SARS Coronavirus 2 by RT PCR (hospital order, performed in [facility] hospital lab) Nasopharyngeal Nasopharyngeal Swab     Status: None   Collection Time: 05/06/20 12:17 AM   Specimen: Nasopharyngeal Swab  Result Value Ref Range Status   SARS Coronavirus 2 NEGATIVE NEGATIVE Final    Comment: (NOTE) SARS-CoV-2 target nucleic acids are NOT DETECTED.  The SARS-CoV-2 RNA is generally detectable in upper and lower respiratory specimens during the acute phase of infection. The  lowest concentration of SARS-CoV-2 viral copies this assay can detect is 250 copies / mL. A negative result does not preclude SARS-CoV-2 infection and should not be used as the sole basis for treatment or other patient management decisions.  A negative result may occur with improper specimen collection / handling, submission of specimen other than nasopharyngeal swab, presence of viral mutation(s) within the areas targeted by this assay, and inadequate number of viral copies (<250 copies / mL). A negative result must be combined with clinical observations, patient history, and epidemiological information.  Fact Sheet for Patients:   StrictlyIdeas.no  Fact Sheet for Healthcare Providers: BankingDealers.co.za  This test is not yet approved or  cleared by the [place] FDA and has been authorized for detection and/or diagnosis of SARS-CoV-2 by FDA under an Emergency Use Authorization (EUA).  This EUA will remain in effect (meaning this test can be used) for the duration of the COVID-19 declaration under Section 564(b)(1) of the Act, 21 U.S.C. section 360bbb-3(b)(1), unless the authorization is terminated or revoked sooner.  Performed at [facility], [place] [address] [place], [place]  [address]   Urine culture     Status: None   Collection Time: 05/06/20 12:59 AM   Specimen: Urine, Random  Result Value Ref Range Status   Specimen Description URINE, RANDOM  Final   Special Requests NONE  Final   Culture   Final    NO GROWTH Performed at [facility], [place] [address], [place], [place] [address]    Report Status 05/07/2020 FINAL  Final  Blood culture (routine x 2)     Status: None (Preliminary result)   Collection Time: 05/06/20  4:26 AM   Specimen: BLOOD RIGHT HAND  Result Value Ref Range Status   Specimen Description BLOOD RIGHT HAND  Final   Special Requests   Final    BOTTLES DRAWN AEROBIC AND ANAEROBIC Blood  Culture adequate volume   Culture   Final    NO GROWTH 1 DAY Performed at [facility], [place] [address], [place], [place] [address]    Report Status PENDING  Incomplete  Blood culture (routine x 2)     Status: None (Preliminary result)   Collection Time: 05/06/20  4:36 AM   Specimen: BLOOD LEFT FOREARM  Result Value Ref Range Status   Specimen Description BLOOD LEFT FOREARM  Final   Special Requests   Final    BOTTLES DRAWN AEROBIC AND ANAEROBIC Blood Culture adequate volume   Culture   Final    NO GROWTH 1 DAY Performed at [facility], [place] [address] [place], [place] [address]    Report Status PENDING  Incomplete         Radiology Studies: DG Chest 2 View  Result Date: 05/05/2020 CLINICAL DATA:  Short of breath, weakness EXAM: CHEST - 2 VIEW COMPARISON:  04/04/2018 FINDINGS: Frontal and lateral views of the chest demonstrate stable dual lead pacemaker. Postsurgical changes from median sternotomy are again noted. Cardiac silhouette is stable. Mild atherosclerosis of the aortic arch. Stable calcified granulomas within the right lower lobe. No acute airspace disease, effusion, or pneumothorax. IMPRESSION: 1. Stable exam, no acute process. Electronically Signed   By: Randa Ngo M.D.   On: 05/05/2020 21:57   CT Angio Chest PE W and/or Wo Contrast  Result Date: 05/06/2020 CLINICAL DATA:  86 year old with shortness of breath. History of pulmonary embolus. Aspiration pneumonia. EXAM: CT ANGIOGRAPHY CHEST WITH CONTRAST TECHNIQUE: Multidetector CT imaging of the chest was performed using the standard protocol during bolus administration of intravenous contrast. Multiplanar CT image reconstructions and MIPs were obtained to evaluate the vascular anatomy. CONTRAST:  13mL OMNIPAQUE IOHEXOL 350 MG/ML SOLN COMPARISON:  Chest CT 03/01/2018.  Radiograph yesterday. FINDINGS: Cardiovascular: There are no filling defects within the pulmonary arteries to suggest pulmonary embolus. Post  median sternotomy and CABG. Dense calcification of native coronary arteries. Aortic atherosclerosis and tortuosity. Cannot assess for dissection given phase of contrast tailored to pulmonary artery evaluation. Left-sided pacemaker in place. Contrast refluxes into the hepatic veins and IVC. Mediastinum/Nodes: Shotty right greater than left hilar lymph nodes. Few prominent mediastinal lymph nodes are not enlarged by size criteria. Esophagus minimally patulous. No wall thickening. Lungs/Pleura: Heterogeneous pulmonary parenchyma. Bronchial thickening which is prominent in the lower lobes with areas of mucous plugging bronchial filling. Mild patchy and ground-glass opacities in the dependent lower lobes. Calcified granulomas in the right lower lobe. No pleural fluid. No pulmonary mass. Upper Abdomen: Contrast refluxes into the hepatic veins and IVC. Left renal cysts. Nonobstructing left renal stone versus vascular calcification. j Musculoskeletal: Median  sternotomy. Diffuse degenerative change in the spine. Chronic T10 compression fracture. Slight loss of height of T12 superior endplate has progressed from prior exam but is age indeterminate, no soft tissue changes to suggest acute. Review of the MIP images confirms the above findings. IMPRESSION: 1. No pulmonary embolus. 2. Cardiomegaly post CABG. Contrast refluxes into the hepatic veins and IVC, consistent with elevated right heart pressures. 3. Bronchial thickening which is prominent in the lower lobes with areas of mucous plugging bronchial filling. Mild patchy and ground-glass opacities in the dependent lower lobes may be atelectasis or pneumonia, including aspiration. 4. Heterogeneous pulmonary parenchyma, can be seen with small airways disease. 5. Chronic T10 compression fracture. Slight loss of height of T12 superior endplate has progressed from prior exam but is age indeterminate, likely chronic but technically age indeterminate. Aortic Atherosclerosis  (ICD10-I70.0). Electronically Signed   By: Keith Rake M.D.   On: 05/06/2020 03:05        Scheduled Meds: . albuterol  2.5 mg Nebulization BID  . allopurinol  300 mg Oral Daily  . aspirin  81 mg Oral QODAY  . atenolol  50 mg Oral Daily  . cholecalciferol  4,000 Units Oral Daily  . furosemide  40 mg Oral BID  . mometasone-formoterol  2 puff Inhalation BID  . potassium chloride  40 mEq Oral BID  . predniSONE  40 mg Oral Q breakfast  . warfarin  1.25 mg Oral Q48H  . warfarin  2.5 mg Oral Q48H  . Warfarin - Physician Dosing Inpatient   Does not apply q1600   Continuous Infusions: . ampicillin-sulbactam (UNASYN) IV 3 g (05/07/20 0001)     LOS: 1 day    Time spent: 25 Minutes    Dana Allan, MD  [facility] Pager #: [phone] 7PM-7AM contact night coverage as above

## 2020-05-07 NOTE — Progress Notes (Addendum)
CSW received call from [facility] at [facility]. Informed that pt already active with [facility] PT/RN.

## 2020-05-07 NOTE — Evaluation (Signed)
Physical Therapy Evaluation Patient Details Name: [name] MRN: [id number] DOB: 06/04/1923 Today's Date: 05/07/2020   History of Present Illness  Pt is 86 yo male with PMH of afib, CAD, CHF, HTN, hypelpidemia, hx of PE who presented to the ED with c/o weakness.  Pt has had cough and SHOB for the past week.  Pt admitted with PNE.  Clinical Impression  Pt admitted with above diagnosis. Pt very pleasant and motivated.  He needed min cues for transfers and gait.  Pt has caregivers and family that assist as needed at home.   Pt currently with functional limitations due to the deficits listed below (see PT Problem List). Pt will benefit from skilled PT to increase their independence and safety with mobility to allow discharge to the venue listed below.       Follow Up Recommendations Home health PT;Supervision - Intermittent;Supervision for mobility/OOB    Equipment Recommendations  None recommended by PT    Recommendations for Other Services       Precautions / Restrictions Precautions Precautions: Fall      Mobility  Bed Mobility Overal bed mobility: Needs Assistance Bed Mobility: Supine to Sit;Sit to Supine     Supine to sit: Mod assist Sit to supine: Min assist   General bed mobility comments: Mod A to lift trunk and scoot forward.  Min A for legs back to bed.  Of note pt does sleep in lift chair  Transfers Overall transfer level: Needs assistance Equipment used: Rolling walker (2 wheeled) Transfers: Sit to/from [employer] Transfers Sit to Stand: Min guard            Ambulation/Gait Ambulation/Gait assistance: Min guard Gait Distance (Feet): 180 Feet Assistive device: Rolling walker (2 wheeled) Gait Pattern/deviations: Decreased stride length;Shuffle;Trunk flexed Gait velocity: decreased   General Gait Details: multiple standing breaks but tolerated well without DOE; VSS  Stairs            Wheelchair Mobility    Modified Rankin (Stroke  Patients Only)       Balance Overall balance assessment: Needs assistance Sitting-balance support: Feet supported Sitting balance-Leahy Scale: Good     Standing balance support: Bilateral upper extremity supported;During functional activity Standing balance-Leahy Scale: Fair                               Pertinent Vitals/Pain Pain Assessment: No/denies pain    Home Living Family/patient expects to be discharged to:: Private residence Living Arrangements: Alone Available Help at Discharge: Family;Available PRN/intermittently Type of Home: House Home Access: Ramped entrance     Home Layout: One level Home Equipment: Walker - 2 wheels;Walker - 4 wheels;Grab bars - toilet;Wheelchair - manual;Hospital bed;Other (comment)      Prior Function Level of Independence: Needs assistance   Gait / Transfers Assistance Needed: Typically able to mobilize with RW in the home   ADL's / Homemaking Assistance Needed: Daily caregivers  9-2, assist with bathing, dressing, house keeping and cooking. Pt typically able to toilet self and use urinal at night. Caregiver assists with sponge bathing - pt does not transfer to tub   Comments: Sleeps in lift chair, family uses wc for outside of the home     Hand Dominance   Dominant Hand: Right    Extremity/Trunk Assessment   Upper Extremity Assessment Upper Extremity Assessment: Generalized weakness    Lower Extremity Assessment Lower Extremity Assessment: Generalized weakness    Cervical /  Trunk Assessment Cervical / Trunk Assessment: Kyphotic  Communication   Communication: HOH  Cognition Arousal/Alertness: Awake/alert Behavior During Therapy: WFL for tasks assessed/performed Overall Cognitive Status: Within Functional Limits for tasks assessed                                        General Comments General comments (skin integrity, edema, etc.): VSS    Exercises     Assessment/Plan    PT  Assessment Patient needs continued PT services  PT Problem List Decreased strength;Decreased mobility;Decreased coordination;Decreased activity tolerance;Cardiopulmonary status limiting activity;Decreased balance;Decreased knowledge of use of DME       PT Treatment Interventions DME instruction;Therapeutic activities;Gait training;Therapeutic exercise;Patient/family education;Balance training;Functional mobility training    PT Goals (Current goals can be found in the Care Plan section)  Acute Rehab PT Goals Patient Stated Goal: work with therapy, get stronger, walk better PT Goal Formulation: With patient/family Time For Goal Achievement: 05/21/20 Potential to Achieve Goals: Good    Frequency Min 3X/week   Barriers to discharge        Co-evaluation               AM-PAC PT "6 Clicks" Mobility  Outcome Measure Help needed turning from your back to your side while in a flat bed without using bedrails?: A Little Help needed moving from lying on your back to sitting on the side of a flat bed without using bedrails?: A Lot Help needed moving to and from a bed to a chair (including a wheelchair)?: A Little Help needed standing up from a chair using your arms (e.g., wheelchair or bedside chair)?: A Little Help needed to walk in hospital room?: A Little Help needed climbing 3-5 steps with a railing? : A Lot 6 Click Score: 16    End of Session Equipment Utilized During Treatment: Gait belt Activity Tolerance: Patient tolerated treatment well Patient left: in bed;with bed alarm set;with call bell/phone within reach;with family/visitor present Nurse Communication: Mobility status PT Visit Diagnosis: Unsteadiness on feet (R26.81);Muscle weakness (generalized) (M62.81)    Time: 7169-6789 PT Time Calculation (min) (ACUTE ONLY): 29 min   Charges:   PT Evaluation $PT Eval Low Complexity: 1 Low PT Treatments $Gait Training: 8-22 mins        Abran Richard, PT Acute Rehab  Services Pager [phone] [facility] [place] 05/07/2020, 2:54 PM

## 2020-05-07 NOTE — Progress Notes (Signed)
Modified Barium Swallow Progress Note  Patient Details  Name: [name] MRN: [id number] Date of Birth: 03-19-1923  Today's Date: 05/07/2020  Modified Barium Swallow completed.  Full report located under Chart Review in the Imaging Section.  Brief recommendations include the following:  Clinical Impression    [name] demonstrates a mild oral and moderate pharyngeal dysphagia. Oral phase is c/b: prolonged mastication of solids with complete recollection prior to the swallow; likely d/t dentition; pt i'ly defers regular solid trials. Pharyngeal phase is c/b: reduced strength/ROM of all structures, consistent with presbyphagia. Reduced BOT retraction, epiglottic inversion, hyolaryngeal excursion, laryngeal vestibule closure, resulting in mild-moderate aspiration of thin liquids via cup and severe aspiration of thin liquids via straw. Pt is sensate and exhibits strong cough response, however, is unable to effectively clear aspiration. Deficits also resulted in trace penetration of nectar thick liquids, shallow in depth and typically fully removed from laryngeal vestibule. Findings consistent with nectar thick liquids via cup or via straw. Pt with excellent clearance and no penetration/aspiration given purees and soft solids. Recommend: nectar thick liquids via cup or straw, Dys 3 solids, diligent oral care with toothbrush/toothpaste TID, meds whole in puree, dysphagia treatment. Findings discussed with patient who demonstrated understanding and agreement.   Swallow Evaluation Recommendations   SLP Diet Recommendations: Nectar thick liquid;Dysphagia 3 (Mech soft) solids   Liquid Administration via: Cup;Straw   Medication Administration: Whole meds with puree       Compensations: Small sips/bites;Follow solids with liquid   Postural Changes: Seated upright at 90 degrees   Oral Care Recommendations: Oral care BID;Staff/trained caregiver to provide oral care   Other Recommendations:  Order thickener from [place]. Adaleen Hulgan, M.S., CCC-SLP Speech-Language Pathologist Acute Rehabilitation Services Pager: [place] 05/07/2020,1:39 PM

## 2020-05-08 DIAGNOSIS — I5042 Chronic combined systolic (congestive) and diastolic (congestive) heart failure: Secondary | ICD-10-CM

## 2020-05-08 DIAGNOSIS — I10 Essential (primary) hypertension: Secondary | ICD-10-CM

## 2020-05-08 DIAGNOSIS — J690 Pneumonitis due to inhalation of food and vomit: Secondary | ICD-10-CM

## 2020-05-08 DIAGNOSIS — I482 Chronic atrial fibrillation, unspecified: Secondary | ICD-10-CM

## 2020-05-08 LAB — PROTIME-INR
INR: 2.6 — ABNORMAL HIGH (ref 0.8–1.2)
Prothrombin Time: 26.9 seconds — ABNORMAL HIGH (ref 11.4–15.2)

## 2020-05-08 MED ORDER — AMOXICILLIN-POT CLAVULANATE 875-125 MG PO TABS
1.0000 | ORAL_TABLET | Freq: Two times a day (BID) | ORAL | Status: DC
  Administered 2020-05-08 – 2020-05-09 (×3): 1 via ORAL
  Filled 2020-05-08 (×3): qty 1

## 2020-05-08 NOTE — Consult Note (Signed)
[facility] Nurse Consult Note: Reason for Consult: LE with wraps in place Removed by bedside nursing for WOC assessment Wound type: venous stasis Maintained at home with [facility] providing topical wound care and Unna's boot compression wraps 2x wk  Pressure Injury POA: NA Measurement: Left lateral 1cm x 1cm x 0.1cm  Right lateral:  Right medial calf: 0.2cm x 0.3cm x 0.1cm  Wound bed:100% bloody clean, moist Drainage (amount, consistency, odor) serosanguinous  Periwound: hemosiderin staining, mild edema, venous dermatitis  Dressing procedure/placement/frequency: Silicone foam to the open wounds change 2x week with Unna's boot changes Continue Unna's boots bilaterally change 2x week on Mondays and Thursdays beginning 05/13/20.

## 2020-05-08 NOTE — Progress Notes (Signed)
Orthopedic Tech Progress Note Patient Details:  [name] 05-21-1923 [id number]  Ortho Devices Type of Ortho Device: Haematologist Ortho Device/Splint Location: RLE, LLE Ortho Device/Splint Interventions: Ordered, Application   Post Interventions Patient Tolerated: Well Instructions Provided: Care of device   Petra Kuba 05/08/2020, 12:56 PM

## 2020-05-08 NOTE — Plan of Care (Signed)
  Problem: Activity: Goal: Ability to tolerate increased activity will improve Outcome: Progressing   Problem: Clinical Measurements: Goal: Ability to maintain a body temperature in the normal range will improve Outcome: Progressing   Problem: Respiratory: Goal: Ability to maintain adequate ventilation will improve Outcome: Progressing Goal: Ability to maintain a clear airway will improve Outcome: Progressing   Problem: Education: Goal: Knowledge of General Education information will improve Description: Including pain rating scale, medication(s)/side effects and non-pharmacologic comfort measures Outcome: Progressing   Problem: Health Behavior/Discharge Planning: Goal: Ability to manage health-related needs will improve Outcome: Progressing   Problem: Clinical Measurements: Goal: Ability to maintain clinical measurements within normal limits will improve Outcome: Progressing   Problem: Nutrition: Goal: Adequate nutrition will be maintained Outcome: Progressing   Problem: Coping: Goal: Level of anxiety will decrease Outcome: Progressing   Problem: Pain Managment: Goal: General experience of comfort will improve Outcome: Progressing   Problem: Safety: Goal: Ability to remain free from injury will improve Outcome: Progressing   Problem: Skin Integrity: Goal: Risk for impaired skin integrity will decrease Outcome: Progressing

## 2020-05-08 NOTE — Progress Notes (Signed)
[facility] PROGRESS NOTE    Progress Note  [name]  PJK:[id number] DOB: Jul 26, 1923 DOA: 05/05/2020 PCP: Lavone Orn, MD     Brief Narrative:   [name] is an 86 y.o. male past medical history significant for atrial fibrillation on Coumadin, CAD, heart failure essential hypertension previous history of PE presents with productive cough and shortness of breath.  He was found to have pneumonia likely due to aspiration and started on empiric antibiotics now currently on Unasyn.  Assessment/Plan:   Aspiration pneumonia of both lower lobes due to gastric secretions [facility]): Speech evaluated the patient and they recommended a dysphagia three diet. He is currently on IV Unasyn respiratory panel PCR was positive for rhinovirus. Culture data has remained negative till date. We will change him to oral Augmentin.  Therapy has evaluated the patient and recommended home health PT.  Generalized weakness: Physical therapy evaluated the patient and recommended home health PT.  Atrial fibrillation, chronic (HCC) Continue Coumadin per pharmacy.  Chronic combined systolic and diastolic CHF (congestive heart failure) ([place]) He is to be stable.  Essential hypertension Current home regimen blood pressure seems to be fairly controlled.  Stage I sacral decubitus ulcer present on admission RN Pressure Injury Documentation: Pressure Injury 08/15/17 Stage I -  Intact skin with non-blanchable redness of a localized area usually over a bony prominence. (Active)  08/15/17 1428  Location: Sacrum  Location Orientation: Medial  Staging: Stage I -  Intact skin with non-blanchable redness of a localized area usually over a bony prominence.  Wound Description (Comments):   Present on Admission: Yes     Pressure Injury 02/28/18 Stage I -  Intact skin with non-blanchable redness of a localized area usually over a bony prominence. (Active)  02/28/18 1738  Location: Toe (Comment  which one)    Location Orientation: Anterior;Right  Staging: Stage I -  Intact skin with non-blanchable redness of a localized area usually over a bony prominence.  Wound Description (Comments):   Present on Admission: Yes     Pressure Injury 04/06/18 Stage I -  Intact skin with non-blanchable redness of a localized area usually over a bony prominence. red, non-blanchable (Active)  04/06/18 1458  Location: Buttocks  Location Orientation: Medial  Staging: Stage I -  Intact skin with non-blanchable redness of a localized area usually over a bony prominence.  Wound Description (Comments): red, non-blanchable  Present on Admission:     Estimated body mass index is 32.56 kg/m as calculated from the following:   Height as of this encounter: 5\' 2"  (1.575 m).   Weight as of this encounter: 80.7 kg.   DVT prophylaxis: lovenox Family Communication:none Status is: Inpatient  Remains inpatient appropriate because:Hemodynamically unstable   Dispo: The patient is from: Home              Anticipated d/c is to: Home              Anticipated d/c date is: 1 day              Patient currently is medically stable to d/c.        Code Status:     Code Status Orders  (From admission, onward)         Start     Ordered   05/06/20 0425  Do not attempt resuscitation (DNR)  Continuous       Question Answer Comment  In the event of cardiac or respiratory ARREST Do not call a "  code blue"   In the event of cardiac or respiratory ARREST Do not perform Intubation, CPR, defibrillation or ACLS   In the event of cardiac or respiratory ARREST Use medication by any route, position, wound care, and other measures to relive pain and suffering. May use oxygen, suction and manual treatment of airway obstruction as needed for comfort.      05/06/20 0429        Code Status History    Date Active Date Inactive Code Status Order ID Comments User Context   08/16/2018 0355 08/18/2018 1730 DNR [id number]  Shela Leff, MD Inpatient   04/04/2018 2014 04/07/2018 1723 Full Code [id number]  Etta Quill, DO ED   02/28/2018 1021 03/04/2018 1600 Full Code [id number]  Kayleen Memos, DO ED   08/15/2017 1131 08/18/2017 1905 Full Code [id number]  Elwin Mocha, MD ED   03/22/2017 0020 03/23/2017 2007 Full Code [id number]  Edwin Dada, MD Inpatient   09/26/2015 1741 09/27/2015 1355 Full Code [id number]  Geradine Girt, DO Inpatient   07/07/2015 1233 07/09/2015 1446 Full Code [id number]  Debbe Odea, MD ED   05/06/2015 0151 05/07/2015 1810 Full Code [id number]  Lavina Hamman, MD Inpatient   11/29/2011 2159 12/03/2011 2003 Partial Code [id number]  Adonis Huguenin, RN Inpatient   11/09/2011 2126 11/18/2011 1502 Full Code [id number]  Loftis, Valeda Malm, RN Inpatient   Advance Care Planning Activity    Advance Directive Documentation     Most Recent Value  Type of Advance Directive Out of facility DNR (pink MOST or yellow form)  Pre-existing out of facility DNR order (yellow form or pink MOST form) --  "MOST" Form in Place? --        IV Access:    Peripheral IV   Procedures and diagnostic studies:   DG Swallowing Func-Speech Pathology  Result Date: 05/07/2020 Objective Swallowing Evaluation: Type of Study: Bedside Swallow Evaluation  Patient Details Name: [name] MRN: [id number] Date of Birth: [birth year] Today's Date: 05/07/2020 Time: SLP Start Time (ACUTE ONLY): 1250 -SLP Stop Time (ACUTE ONLY): 1316 SLP Time Calculation (min) (ACUTE ONLY): 26 min Past Medical History: Past Medical History: Diagnosis Date . Arrhythmia   afib . Arthritis   "back and hands" (09/26/2015) . Basal cell carcinoma  . CHF (congestive heart failure) ([place])  . Coronary artery disease  . CVI (common variable immunodeficiency) ([place])  . DJD (degenerative joint disease)  . ED (erectile dysfunction)  . GERD (gastroesophageal reflux disease)  . Gout  . Heart attack ([place]) 1986 . Hyperlipidemia  . Hypertension  . IFG (impaired  fasting glucose)  . Neuropathy  . Non Hodgkin's lymphoma ([place]) dx'd 2006 . PAC (premature atrial contraction)  . PE (pulmonary embolism) 2007  "when I was taking chemo" . Peptic ulcer disease  . Pneumonia ~ 2007 X 2 . Squamous carcinoma  . Venous stasis ulcers (HCC)  Past Surgical History: Past Surgical History: Procedure Laterality Date . BACK SURGERY   . CARDIAC CATHETERIZATION  1990 . CATARACT EXTRACTION W/ INTRAOCULAR LENS  IMPLANT, BILATERAL Bilateral 10/2006 ~ 11/2006 . CORONARY ANGIOPLASTY   . CORONARY ARTERY BYPASS GRAFT  1990  CABG X3 . ELBOW SURGERY Left   "related to gout" . INSERT / REPLACE / REMOVE PACEMAKER  08/2010 . KNEE ARTHROSCOPY Left  . LUMBAR DISC SURGERY    "had pinched nerve; had to make room for it" . NASAL SINUS SURGERY  1980  "related to bacteria infection in sinus" .  SQUAMOUS CELL CARCINOMA EXCISION Right 09/12/2015  cheek . TONSILLECTOMY  1972 HPI: [name] is a 86 y.o. male with medical history significant of atrial fibrillation on Coumadin, CAD, CHF, hypertension, hyperlipidemia, previous history of PE who presents to the emergency department complaints of weakness. Patient has interim history cough,and mild sob x 1 week. He was treated for bronchitis by his pcp with steroids and antibiotics. His symptoms however persisted and patient was noted to very weak with inability to get of bed. Due to this patient has had home xray that was concerning for PNA. H/o dysphagia with prior need for thickened liquids.  Subjective: Pt upright in MBSS chair, agreeable to study. Assessment / Plan / Recommendation CHL IP CLINICAL IMPRESSIONS 05/07/2020 Clinical Impression Mr. Rosello demonstrates a mild oral and moderate pharyngeal dysphagia. Oral phase is c/b: prolonged mastication of solids with complete recollection prior to the swallow; likely d/t dentition; pt i'ly defers regular solid trials. Pharyngeal phase is c/b: reduced strength/ROM of all structures, consistent with presbyphagia. Reduced  BOT retraction, epiglottic inversion, hyolaryngeal excursion, laryngeal vestibule closure, resulting in mild-moderate aspiration of thin liquids via cup and severe aspiration of thin liquids via straw. Pt is sensate and exhibits strong cough response, however, is unable to effectively clear aspiration. Deficits also resulted in trace penetration of nectar thick liquids, shallow in depth and typically fully removed from laryngeal vestibule. Findings consistent with nectar thick liquids via cup or via straw. Pt with excellent clearance and no penetration/aspiration given purees and soft solids. Recommend: nectar thick liquids via cup or straw, Dys 3 solids, diligent oral care with toothbrush/toothpaste TID, meds whole in puree, dysphagia treatment. Findings discussed with patient who demonstrated understanding and agreement. SLP Visit Diagnosis Dysphagia, unspecified (R13.10) Impact on safety and function Severe aspiration risk   CHL IP TREATMENT RECOMMENDATION 05/07/2020 Treatment Recommendations F/U MBS in --- days (Comment);Therapy as outlined in treatment plan below   Prognosis 05/07/2020 Prognosis for Safe Diet Advancement Fair Barriers to Reach Goals Time post onset;Severity of deficits Barriers/Prognosis Comment Advanced Age CHL IP DIET RECOMMENDATION 05/07/2020 SLP Diet Recommendations Nectar thick liquid;Dysphagia 3 (Mech soft) solids Liquid Administration via Cup;Straw Medication Administration Whole meds with puree Compensations Small sips/bites;Follow solids with liquid Postural Changes Seated upright at 90 degrees   CHL IP OTHER RECOMMENDATIONS 05/07/2020 Recommended Consults n/a Oral Care Recommendations Oral care BID;Staff/trained caregiver to provide oral care Other Recommendations Order thickener from pharmacy   CHL IP FOLLOW UP RECOMMENDATIONS 05/07/2020 Follow up Recommendations Home health SLP   CHL IP FREQUENCY AND DURATION 05/07/2020 Speech Therapy Frequency (ACUTE ONLY) min 2x/week Treatment Duration  1 week      CHL IP ORAL PHASE 05/07/2020 Oral Phase WFL  CHL IP PHARYNGEAL PHASE 05/07/2020 Pharyngeal Phase Impaired Pharyngeal- Nectar Cup Delayed swallow initiation-vallecula;Reduced pharyngeal peristalsis;Reduced epiglottic inversion;Reduced laryngeal elevation;Reduced anterior laryngeal mobility;Reduced tongue base retraction;Penetration/Aspiration before swallow Pharyngeal Material enters airway, remains ABOVE vocal cords and not ejected out;Material enters airway, remains ABOVE vocal cords then ejected out Pharyngeal- Nectar Straw Reduced pharyngeal peristalsis;Reduced epiglottic inversion;Reduced anterior laryngeal mobility;Reduced laryngeal elevation;Reduced airway/laryngeal closure;Reduced tongue base retraction;Penetration/Aspiration before swallow Pharyngeal- Thin Cup Delayed swallow initiation-pyriform sinuses;Reduced pharyngeal peristalsis;Reduced epiglottic inversion;Reduced anterior laryngeal mobility;Reduced laryngeal elevation;Reduced airway/laryngeal closure;Reduced tongue base retraction;Penetration/Aspiration before swallow;Moderate aspiration Pharyngeal Material enters airway, passes BELOW cords and not ejected out despite cough attempt by patient Pharyngeal- Thin Straw Delayed swallow initiation-pyriform sinuses;Reduced pharyngeal peristalsis;Reduced epiglottic inversion;Reduced anterior laryngeal mobility;Reduced laryngeal elevation;Reduced airway/laryngeal closure;Reduced tongue base retraction;Penetration/Aspiration before swallow;Moderate aspiration;Significant aspiration (Amount) Pharyngeal  Material enters airway, passes BELOW cords and not ejected out despite cough attempt by patient Pharyngeal- Puree WFL Pharyngeal- Mechanical Soft WFL  CHL IP CERVICAL ESOPHAGEAL PHASE 05/07/2020 Cervical Esophageal Phase [facility] P. Isenhour, M.S., CCC-SLP Speech-Language Pathologist Acute Rehabilitation Services Pager: [place] 05/07/2020, 1:31 PM                Medical  Consultants:    None.  Anti-Infectives:   Augmentin  Subjective:    [name] relates he continues to have persistent cough but he feels better than yesterday.  Objective:    Vitals:   05/07/20 2019 05/07/20 2309 05/08/20 0753 05/08/20 0756  BP:  124/84  (!) 144/64  Pulse:  70  64  Resp:  20  18  Temp:  98.1 F (36.7 C)  98 F (36.7 C)  TempSrc:    Oral  SpO2: 99% 95% 98% 100%  Weight:      Height:       SpO2: 100 %   Intake/Output Summary (Last 24 hours) at 05/08/2020 0929 Last data filed at 05/08/2020 0511 Gross per 24 hour  Intake --  Output 700 ml  Net -700 ml   Filed Weights   05/06/20 0015  Weight: 80.7 kg    Exam: General exam: In no acute distress. Respiratory system: Good air movement and clear to auscultation. Cardiovascular system: S1 & S2 heard, RRR. No JVD. Gastrointestinal system: Abdomen is nondistended, soft and nontender.  Extremities: No pedal edema. Skin: No rashes, lesions or ulcers Psychiatry: Judgement and insight appear normal. Mood & affect appropriate.    Data Reviewed:    Labs: Basic Metabolic Panel: Recent Labs  Lab 05/05/20 1115 05/05/20 1115 05/05/20 2139 05/06/20 0438  NA 138  --  138 139  K 3.6   < > 4.3 3.7  CL 98  --  101 102  CO2 29  --  25 29  GLUCOSE 69*  --  113* 106*  BUN 32*  --  30* 27*  CREATININE 1.15  --  1.07 1.11  CALCIUM 9.3  --  9.8 9.3   < > = values in this interval not displayed.   GFR Estimated Creatinine Clearance: 36.6 mL/min (by C-G formula based on SCr of 1.11 mg/dL). Liver Function Tests: Recent Labs  Lab 05/06/20 0438  AST 31  ALT 31  ALKPHOS 65  BILITOT 1.0  PROT 6.1*  ALBUMIN 2.8*   No results for input(s): LIPASE, AMYLASE in the last 168 hours. No results for input(s): AMMONIA in the last 168 hours. Coagulation profile Recent Labs  Lab 05/06/20 0122 05/07/20 0311 05/08/20 0318  INR 2.1* 2.4* 2.6*   COVID-19 Labs  No results for input(s): DDIMER,  FERRITIN, LDH, CRP in the last 72 hours.  Lab Results  Component Value Date   [place] NEGATIVE 05/06/2020    CBC: Recent Labs  Lab 05/05/20 1115 05/05/20 2139 05/06/20 0438 05/07/20 0311  WBC 11.3* 10.5 10.2 9.9  NEUTROABS 7.5  --   --  6.8  HGB 14.3 15.1 14.0 13.8  HCT 42.3 45.8 43.5 41.7  MCV 101.0* 102.9* 103.6* 102.2*  PLT 198 212 196 204   Cardiac Enzymes: No results for input(s): CKTOTAL, CKMB, CKMBINDEX, TROPONINI in the last 168 hours. BNP (last 3 results) No results for input(s): PROBNP in the last 8760 hours. CBG: No results for input(s): GLUCAP in the last 168 hours. D-Dimer: No results for input(s): DDIMER in the last 72 hours. Hgb  A1c: No results for input(s): HGBA1C in the last 72 hours. Lipid Profile: No results for input(s): CHOL, HDL, LDLCALC, TRIG, CHOLHDL, LDLDIRECT in the last 72 hours. Thyroid function studies: No results for input(s): TSH, T4TOTAL, T3FREE, THYROIDAB in the last 72 hours.  Invalid input(s): FREET3 Anemia work up: No results for input(s): VITAMINB12, FOLATE, FERRITIN, TIBC, IRON, RETICCTPCT in the last 72 hours. Sepsis Labs: Recent Labs  Lab 05/05/20 1115 05/05/20 2139 05/06/20 0438 05/07/20 0311  PROCALCITON  --   --  <0.10  --   WBC 11.3* 10.5 10.2 9.9   Microbiology Recent Results (from the past 240 hour(s))  SARS Coronavirus 2 by RT PCR (hospital order, performed in [facility] hospital lab) Nasopharyngeal Nasopharyngeal Swab     Status: None   Collection Time: 05/06/20 12:17 AM   Specimen: Nasopharyngeal Swab  Result Value Ref Range Status   SARS Coronavirus 2 NEGATIVE NEGATIVE Final    Comment: (NOTE) SARS-CoV-2 target nucleic acids are NOT DETECTED.  The SARS-CoV-2 RNA is generally detectable in upper and lower respiratory specimens during the acute phase of infection. The lowest concentration of SARS-CoV-2 viral copies this assay can detect is 250 copies / mL. A negative result does not preclude SARS-CoV-2  infection and should not be used as the sole basis for treatment or other patient management decisions.  A negative result may occur with improper specimen collection / handling, submission of specimen other than nasopharyngeal swab, presence of viral mutation(s) within the areas targeted by this assay, and inadequate number of viral copies (<250 copies / mL). A negative result must be combined with clinical observations, patient history, and epidemiological information.  Fact Sheet for Patients:   StrictlyIdeas.no  Fact Sheet for Healthcare Providers: BankingDealers.co.za  This test is not yet approved or  cleared by the [place] FDA and has been authorized for detection and/or diagnosis of SARS-CoV-2 by FDA under an Emergency Use Authorization (EUA).  This EUA will remain in effect (meaning this test can be used) for the duration of the COVID-19 declaration under Section 564(b)(1) of the Act, 21 U.S.C. section 360bbb-3(b)(1), unless the authorization is terminated or revoked sooner.  Performed at [facility], [place] [address] [place], [place] [address]   Urine culture     Status: None   Collection Time: 05/06/20 12:59 AM   Specimen: Urine, Random  Result Value Ref Range Status   Specimen Description URINE, RANDOM  Final   Special Requests NONE  Final   Culture   Final    NO GROWTH Performed at [facility], [place] [address], [place], [place] [address]    Report Status 05/07/2020 FINAL  Final  Blood culture (routine x 2)     Status: None (Preliminary result)   Collection Time: 05/06/20  4:26 AM   Specimen: BLOOD RIGHT HAND  Result Value Ref Range Status   Specimen Description BLOOD RIGHT HAND  Final   Special Requests   Final    BOTTLES DRAWN AEROBIC AND ANAEROBIC Blood Culture adequate volume   Culture   Final    NO GROWTH 2 DAYS Performed at [facility], [place] [address] [place], [place] [address]     Report Status PENDING  Incomplete  Blood culture (routine x 2)     Status: None (Preliminary result)   Collection Time: 05/06/20  4:36 AM   Specimen: BLOOD LEFT FOREARM  Result Value Ref Range Status   Specimen Description BLOOD LEFT FOREARM  Final  Special Requests   Final    BOTTLES DRAWN AEROBIC AND ANAEROBIC Blood Culture adequate volume   Culture   Final    NO GROWTH 2 DAYS Performed at [facility], [place] [address], [place], [place] [address]    Report Status PENDING  Incomplete  Respiratory Panel by PCR     Status: Abnormal   Collection Time: 05/07/20  8:34 AM   Specimen: Nasopharyngeal Swab; Respiratory  Result Value Ref Range Status   Adenovirus NOT DETECTED NOT DETECTED Final   Coronavirus 229E NOT DETECTED NOT DETECTED Final    Comment: (NOTE) The Coronavirus on the Respiratory Panel, DOES NOT test for the novel  Coronavirus (2019 nCoV)    Coronavirus HKU1 NOT DETECTED NOT DETECTED Final   Coronavirus NL63 NOT DETECTED NOT DETECTED Final   Coronavirus OC43 NOT DETECTED NOT DETECTED Final   Metapneumovirus NOT DETECTED NOT DETECTED Final   Rhinovirus / Enterovirus DETECTED (A) NOT DETECTED Final   Influenza A NOT DETECTED NOT DETECTED Final   Influenza B NOT DETECTED NOT DETECTED Final   Parainfluenza Virus 1 NOT DETECTED NOT DETECTED Final   Parainfluenza Virus 2 NOT DETECTED NOT DETECTED Final   Parainfluenza Virus 3 NOT DETECTED NOT DETECTED Final   Parainfluenza Virus 4 NOT DETECTED NOT DETECTED Final   Respiratory Syncytial Virus NOT DETECTED NOT DETECTED Final   Bordetella pertussis NOT DETECTED NOT DETECTED Final   Chlamydophila pneumoniae NOT DETECTED NOT DETECTED Final   Mycoplasma pneumoniae NOT DETECTED NOT DETECTED Final    Comment: Performed at [facility], [place]. [address], [place], [place] [address]     Medications:   . albuterol  2.5 mg Nebulization BID  . allopurinol  300 mg Oral Daily  . aspirin  81 mg Oral QODAY  . atenolol  50 mg  Oral Daily  . cholecalciferol  4,000 Units Oral Daily  . furosemide  40 mg Oral BID  . mometasone-formoterol  2 puff Inhalation BID  . potassium chloride  40 mEq Oral BID  . predniSONE  40 mg Oral Q breakfast  . warfarin  1.25 mg Oral Q48H  . warfarin  2.5 mg Oral Q48H  . Warfarin - Physician Dosing Inpatient   Does not apply q1600   Continuous Infusions: . ampicillin-sulbactam (UNASYN) IV 3 g (05/08/20 0510)      LOS: 2 days   Charlynne Cousins  [facility]  05/08/2020, 9:29 AM

## 2020-05-08 NOTE — Progress Notes (Signed)
Physical Therapy Treatment Patient Details Name: [name] MRN: [id number] DOB: [birth year] Today's Date: 05/08/2020    History of Present Illness Pt is 86 yo male with PMH of afib, CAD, CHF, HTN, hypelpidemia, hx of PE who presented to the ED with c/o weakness.  Pt has had cough and SHOB for the past week.  Pt admitted with PNA.    PT Comments    Pt eager to participate in PT today, ambulating hallway distance with use of RW and very increased time for standing rest breaks as needed. Pt with mild dyspnea during gait and unable to obtain pulse ox reading during gait due to poor wave, but SpO2 96% immediately post-ambulation. Pt states he feels he has improved steadiness during gait this session vs previously, and pt tolerated repeated transfer training for improved transfer form post-ambulation. PT to continue to recommend HHPT, will continue to follow acutely.    Follow Up Recommendations  Home health PT;Supervision for mobility/OOB     Equipment Recommendations  None recommended by PT    Recommendations for Other Services       Precautions / Restrictions Precautions Precautions: Fall Restrictions Weight Bearing Restrictions: No    Mobility  Bed Mobility Overal bed mobility: Needs Assistance Bed Mobility: Supine to Sit;Sit to Supine     Supine to sit: Min assist;HOB elevated Sit to supine: Min guard;HOB elevated   General bed mobility comments: min assist for trunk elevation and LE management to EOB, min guard for return to supine for safety with increased time and effort. PT assisted pt with positioning in bed with use of bed pads.  Transfers Overall transfer level: Needs assistance Equipment used: Rolling walker (2 wheeled) Transfers: Sit to/from Stand Sit to Stand: Min guard         General transfer comment: for safety, verbal cuing for safe hand placement when rising and sitting.  Ambulation/Gait Ambulation/Gait assistance: Min guard Gait Distance  (Feet): 160 Feet Assistive device: Rolling walker (2 wheeled) Gait Pattern/deviations: Step-through pattern;Decreased stride length;Trunk flexed Gait velocity: decr   General Gait Details: Min guard for safety, multiple standing rest breaks x15-30 seconds only, HR WFL and SpO2 96% immediately post-hallway ambulation (pulse oximeter unable to read pt during ambulation, multiple attempts)   Chief Strategy Officer    Modified Rankin (Stroke Patients Only)       Balance Overall balance assessment: Needs assistance Sitting-balance support: Feet supported Sitting balance-Leahy Scale: Good     Standing balance support: Bilateral upper extremity supported;During functional activity Standing balance-Leahy Scale: Fair                              Cognition Arousal/Alertness: Awake/alert Behavior During Therapy: WFL for tasks assessed/performed Overall Cognitive Status: Within Functional Limits for tasks assessed                                        Exercises General Exercises - Lower Extremity Mini-Sqauts: Both;5 reps (x5 from chair position, emphasis on power up, hip extension to complete upright, and slow eccentric lower. Increased time and effort to perform with rest breaks required)    General Comments General comments (skin integrity, edema, etc.): vss      Pertinent Vitals/Pain Pain Assessment: Faces Faces Pain Scale: No hurt Pain Intervention(s): Monitored  during session    Home Living                      Prior Function            PT Goals (current goals can now be found in the care plan section) Acute Rehab PT Goals Patient Stated Goal: work with therapy, get stronger, walk better PT Goal Formulation: With patient/family Time For Goal Achievement: 05/21/20 Potential to Achieve Goals: Good Progress towards PT goals: Progressing toward goals    Frequency    Min 3X/week      PT Plan Current  plan remains appropriate    Co-evaluation              AM-PAC PT "6 Clicks" Mobility   Outcome Measure  Help needed turning from your back to your side while in a flat bed without using bedrails?: A Little Help needed moving from lying on your back to sitting on the side of a flat bed without using bedrails?: A Little Help needed moving to and from a bed to a chair (including a wheelchair)?: A Little Help needed standing up from a chair using your arms (e.g., wheelchair or bedside chair)?: A Little Help needed to walk in hospital room?: A Little Help needed climbing 3-5 steps with a railing? : A Lot 6 Click Score: 17    End of Session Equipment Utilized During Treatment: Gait belt Activity Tolerance: Patient tolerated treatment well Patient left: in bed;with bed alarm set;with call bell/phone within reach;with family/visitor present Nurse Communication: Mobility status PT Visit Diagnosis: Unsteadiness on feet (R26.81);Muscle weakness (generalized) (M62.81)     Time: 6712-4580 PT Time Calculation (min) (ACUTE ONLY): 30 min  Charges:  $Gait Training: 8-22 mins $Therapeutic Activity: 8-22 mins                    [name] E, PT [place] Pager [phone]  Office [phone]   [name] D [name] 05/08/2020, 2:54 PM

## 2020-05-09 LAB — PROTIME-INR
INR: 3 — ABNORMAL HIGH (ref 0.8–1.2)
Prothrombin Time: 30 seconds — ABNORMAL HIGH (ref 11.4–15.2)

## 2020-05-09 MED ORDER — AMOXICILLIN-POT CLAVULANATE 875-125 MG PO TABS: 1.0000 | ORAL_TABLET | Freq: Two times a day (BID) | ORAL | 0 refills | Status: AC

## 2020-05-09 NOTE — Progress Notes (Signed)
Physical Therapy Treatment Patient Details Name: [name] MRN: [id number] DOB: Dec 16, 1922 Today's Date: 05/09/2020    History of Present Illness Pt is 86 yo male with PMH of afib, CAD, CHF, HTN, hypelpidemia, hx of PE who presented to the ED with c/o weakness.  Pt has had cough and SHOB for the past week.  Pt admitted with PNA.    PT Comments    Pt demonstrating good progress.  He was able to increase gait distance with min cues and no LOB.  Pt did require assist for bed mobility, but sleeps in a lift chair at home.  Pt with family/caregiver support at home and motivated to stay moving.  Cont POC.    Follow Up Recommendations  Home health PT;Supervision for mobility/OOB     Equipment Recommendations  None recommended by PT    Recommendations for Other Services       Precautions / Restrictions Precautions Precautions: Fall    Mobility  Bed Mobility Overal bed mobility: Needs Assistance Bed Mobility: Supine to Sit;Sit to Supine     Supine to sit: Min assist;HOB elevated     General bed mobility comments: Min asist for trunk elevation and use of bed pad to facilitate scoot forward.  Transfers Overall transfer level: Needs assistance Equipment used: Rolling walker (2 wheeled) Transfers: Sit to/from Stand Sit to Stand: Min guard         General transfer comment: Performed x 3  Ambulation/Gait Ambulation/Gait assistance: Min guard Gait Distance (Feet): 250 Feet Assistive device: Rolling walker (2 wheeled) Gait Pattern/deviations: Step-through pattern;Decreased stride length;Trunk flexed     General Gait Details: Min guard for safety. Pt with multiple standing rest break 15-30 seconds each (did not appear fatigued, liked to stop to talk).  Pt cued for posture and RW use.   Pt was steady with no LOB.  All VSS   Stairs             Wheelchair Mobility    Modified Rankin (Stroke Patients Only)       Balance Overall balance assessment: Needs  assistance Sitting-balance support: Feet supported;No upper extremity supported Sitting balance-Leahy Scale: Good     Standing balance support: Bilateral upper extremity supported;During functional activity Standing balance-Leahy Scale: Poor Standing balance comment: Pt reliant on UE support on RW                            Cognition Arousal/Alertness: Awake/alert Behavior During Therapy: WFL for tasks assessed/performed Overall Cognitive Status: Within Functional Limits for tasks assessed                                        Exercises      General Comments General comments (skin integrity, edema, etc.): VSS      Pertinent Vitals/Pain Pain Assessment: No/denies pain    Home Living                      Prior Function            PT Goals (current goals can now be found in the care plan section) Acute Rehab PT Goals Patient Stated Goal: work with therapy, get stronger, walk better PT Goal Formulation: With patient/family Time For Goal Achievement: 05/21/20 Potential to Achieve Goals: Good Progress towards PT goals: Progressing toward goals    Frequency  Min 3X/week      PT Plan Current plan remains appropriate    Co-evaluation              AM-PAC PT "6 Clicks" Mobility   Outcome Measure  Help needed turning from your back to your side while in a flat bed without using bedrails?: A Little Help needed moving from lying on your back to sitting on the side of a flat bed without using bedrails?: A Little Help needed moving to and from a bed to a chair (including a wheelchair)?: None Help needed standing up from a chair using your arms (e.g., wheelchair or bedside chair)?: None Help needed to walk in hospital room?: None Help needed climbing 3-5 steps with a railing? : A Little 6 Click Score: 21    End of Session Equipment Utilized During Treatment: Gait belt Activity Tolerance: Patient tolerated treatment  well Patient left: with call bell/phone within reach;in chair (no alarms boxes available; pt verbalized that he would call for assist) Nurse Communication: Mobility status PT Visit Diagnosis: Unsteadiness on feet (R26.81);Muscle weakness (generalized) (M62.81)     Time: 0158-6825 PT Time Calculation (min) (ACUTE ONLY): 30 min  Charges:  $Gait Training: 23-37 mins                     Abran Richard, PT Acute Rehab Services Pager [phone] [facility] [place] 05/09/2020, 1:54 PM

## 2020-05-09 NOTE — Discharge Summary (Signed)
Physician Discharge Summary  [name] URK:[id number] DOB: January 23, 1923 DOA: 05/05/2020  PCP: Lavone Orn, MD  Admit date: 05/05/2020 Discharge date: 05/09/2020  Admitted From: Home Disposition:  Home  Recommendations for Outpatient Follow-up:  1. Follow up with PCP in 1-2 weeks 2. Please obtain BMP/CBC in one week   Home Health:YEs Equipment/Devices:None  Discharge Condition:Stable CODE STATUS:DNR Diet recommendation: Heart Healthy   Brief/Interim Summary: 86 y.o. male past medical history significant for atrial fibrillation on Coumadin, CAD, heart failure essential hypertension previous history of PE presents with productive cough and shortness of breath.  He was found to have pneumonia likely due to aspiration   Discharge Diagnoses:  Active Problems:   Atrial fibrillation, chronic (HCC)   Chronic combined systolic and diastolic CHF (congestive heart failure) ([place])   Essential hypertension   CAP (community acquired pneumonia)   Aspiration pneumonia of both lower lobes due to gastric secretions (HCC)  Aspiration pneumonia: He was started on IV empiric antibiotics on admission speech evaluated the patient and recommended a dysphagia 3 diet. Culture data remain negative he was de-escalating to Augmentin which she will continue as an outpatient for 1 additional day.  Generalized weakness: Likely due to pneumonia, physical therapy evaluated the patient and recommended home health PT.  Chronic atrial fibrillation: Continue Coumadin at current home dose.  Chronic combined systolic heart failure: No changes made to his medication.  Essential hypertension: No changes made to his medication.  Stage I significant ulcer present on admission:  turn the patient every 2 hours and ambulating  Discharge Instructions  Discharge Instructions    Diet - low sodium heart healthy   Complete by: As directed    Increase activity slowly   Complete by: As directed    No wound care    Complete by: As directed      Allergies as of 05/09/2020      Reactions   Avelox [moxifloxacin Hcl In Nacl] Other (See Comments)   Messed up lungs, Respiratory Distress   Indomethacin Other (See Comments)   Renal insufficiency   Levaquin [levofloxacin] Other (See Comments)   Messed up lungs   Lipitor [atorvastatin] Nausea Only, Palpitations, Other (See Comments)   Irregular heart beat also   Morphine And Related Other (See Comments)   Irregular heart beart   Pravachol [pravastatin Sodium] Nausea Only, Palpitations, Other (See Comments)   Irregular heart beat   Aleve [naproxen Sodium] Nausea And Vomiting, Other (See Comments)   GI Upset   Clindamycin/lincomycin Other (See Comments)   Severe acid reflux and stomach pain   Norpace [disopyramide Phosphate] Other (See Comments)   Urinary retention   Tripelennamine    Amlodipine Besylate Rash   Azithromycin Rash   Bactrim [sulfamethoxazole-trimethoprim] Rash   Clinoril [sulindac] Rash   Codeine Other (See Comments)   unknown   Digoxin Rash   Lorazepam Other (See Comments)   unknown   Oxycodone-acetaminophen Rash   Sulfonamide Derivatives Rash   Uloric [febuxostat] Other (See Comments)   unknown      Medication List    TAKE these medications   acetaminophen 325 MG tablet Commonly known as: TYLENOL Take 2 tablets (650 mg total) by mouth every 6 (six) hours as needed for mild pain.   albuterol (2.5 MG/3ML) 0.083% nebulizer solution Commonly known as: PROVENTIL Take 3 mLs (2.5 mg total) by nebulization every 2 (two) hours as needed for wheezing or shortness of breath.   allopurinol 300 MG tablet Commonly known as: ZYLOPRIM Take 300 mg  by mouth daily.   ALPRAZolam 0.5 MG tablet Commonly known as: XANAX Take 1 tablet (0.5 mg total) by mouth at bedtime as needed for anxiety. For sleep   amoxicillin-clavulanate 875-125 MG tablet Commonly known as: AUGMENTIN Take 1 tablet by mouth every 12 (twelve) hours.   aspirin 81  MG tablet Take 1 tablet (81 mg total) by mouth every other day.   atenolol 50 MG tablet Commonly known as: TENORMIN Take 1 tablet (50 mg total) by mouth daily.   Cholecalciferol 100 MCG (4000 UT) Tabs Take 4,000 Units by mouth daily.   clotrimazole-betamethasone cream Commonly known as: LOTRISONE Apply 1 application topically 2 (two) times daily as needed (rash).   colchicine 0.6 MG tablet Take 0.6 mg by mouth daily as needed (gout).   doxycycline 100 MG tablet Commonly known as: VIBRA-TABS Take 100 mg by mouth 2 (two) times daily.   feeding supplement (ENSURE ENLIVE) Liqd Take 237 mLs by mouth 2 (two) times daily between meals.   furosemide 40 MG tablet Commonly known as: LASIX Take 40 mg by mouth 2 (two) times daily.   gabapentin 100 MG capsule Commonly known as: NEURONTIN Take 100 mg by mouth at bedtime as needed (nerve pain).   ipratropium-albuterol 0.5-2.5 (3) MG/3ML Soln Commonly known as: DUONEB Take 3 mLs by nebulization every 6 (six) hours as needed.   mometasone-formoterol 200-5 MCG/ACT Aero Commonly known as: DULERA Inhale 2 puffs into the lungs 2 (two) times daily.   nitroGLYCERIN 0.4 MG SL tablet Commonly known as: NITROSTAT Place 0.4 mg under the tongue every 5 (five) minutes as needed for chest pain.   potassium chloride 10 MEQ tablet Commonly known as: KLOR-CON Take 40 mEq by mouth 2 (two) times daily.   senna-docusate 8.6-50 MG tablet Commonly known as: Senokot-S Take 2 tablets by mouth at bedtime.   triamcinolone ointment 0.1 % Commonly known as: KENALOG Apply 1 application topically 2 (two) times daily.   warfarin 2.5 MG tablet Commonly known as: COUMADIN Take 1 tablet (2.5 mg total) by mouth daily at 6 PM. Or as directed What changed:   how much to take  when to take this  additional instructions       Allergies  Allergen Reactions   Avelox [Moxifloxacin Hcl In Nacl] Other (See Comments)    Messed up lungs, Respiratory  Distress    Indomethacin Other (See Comments)    Renal insufficiency   Levaquin [Levofloxacin] Other (See Comments)    Messed up lungs   Lipitor [Atorvastatin] Nausea Only, Palpitations and Other (See Comments)    Irregular heart beat also   Morphine And Related Other (See Comments)    Irregular heart beart   Pravachol [Pravastatin Sodium] Nausea Only, Palpitations and Other (See Comments)    Irregular heart beat   Aleve [Naproxen Sodium] Nausea And Vomiting and Other (See Comments)    GI Upset   Clindamycin/Lincomycin Other (See Comments)    Severe acid reflux and stomach pain   Norpace [Disopyramide Phosphate] Other (See Comments)    Urinary retention   Tripelennamine    Amlodipine Besylate Rash   Azithromycin Rash   Bactrim [Sulfamethoxazole-Trimethoprim] Rash   Clinoril [Sulindac] Rash   Codeine Other (See Comments)    unknown   Digoxin Rash   Lorazepam Other (See Comments)    unknown   Oxycodone-Acetaminophen Rash   Sulfonamide Derivatives Rash   Uloric [Febuxostat] Other (See Comments)    unknown    Consultations: none  Procedures/Studies: DG Chest 2  View  Result Date: 05/05/2020 CLINICAL DATA:  Short of breath, weakness EXAM: CHEST - 2 VIEW COMPARISON:  04/04/2018 FINDINGS: Frontal and lateral views of the chest demonstrate stable dual lead pacemaker. Postsurgical changes from median sternotomy are again noted. Cardiac silhouette is stable. Mild atherosclerosis of the aortic arch. Stable calcified granulomas within the right lower lobe. No acute airspace disease, effusion, or pneumothorax. IMPRESSION: 1. Stable exam, no acute process. Electronically Signed   By: Randa Ngo M.D.   On: 05/05/2020 21:57   CT Angio Chest PE W and/or Wo Contrast  Result Date: 05/06/2020 CLINICAL DATA:  86 year old with shortness of breath. History of pulmonary embolus. Aspiration pneumonia. EXAM: CT ANGIOGRAPHY CHEST WITH CONTRAST TECHNIQUE: Multidetector CT  imaging of the chest was performed using the standard protocol during bolus administration of intravenous contrast. Multiplanar CT image reconstructions and MIPs were obtained to evaluate the vascular anatomy. CONTRAST:  132mL OMNIPAQUE IOHEXOL 350 MG/ML SOLN COMPARISON:  Chest CT 03/01/2018.  Radiograph yesterday. FINDINGS: Cardiovascular: There are no filling defects within the pulmonary arteries to suggest pulmonary embolus. Post median sternotomy and CABG. Dense calcification of native coronary arteries. Aortic atherosclerosis and tortuosity. Cannot assess for dissection given phase of contrast tailored to pulmonary artery evaluation. Left-sided pacemaker in place. Contrast refluxes into the hepatic veins and IVC. Mediastinum/Nodes: Shotty right greater than left hilar lymph nodes. Few prominent mediastinal lymph nodes are not enlarged by size criteria. Esophagus minimally patulous. No wall thickening. Lungs/Pleura: Heterogeneous pulmonary parenchyma. Bronchial thickening which is prominent in the lower lobes with areas of mucous plugging bronchial filling. Mild patchy and ground-glass opacities in the dependent lower lobes. Calcified granulomas in the right lower lobe. No pleural fluid. No pulmonary mass. Upper Abdomen: Contrast refluxes into the hepatic veins and IVC. Left renal cysts. Nonobstructing left renal stone versus vascular calcification. j Musculoskeletal: Median sternotomy. Diffuse degenerative change in the spine. Chronic T10 compression fracture. Slight loss of height of T12 superior endplate has progressed from prior exam but is age indeterminate, no soft tissue changes to suggest acute. Review of the MIP images confirms the above findings. IMPRESSION: 1. No pulmonary embolus. 2. Cardiomegaly post CABG. Contrast refluxes into the hepatic veins and IVC, consistent with elevated right heart pressures. 3. Bronchial thickening which is prominent in the lower lobes with areas of mucous plugging  bronchial filling. Mild patchy and ground-glass opacities in the dependent lower lobes may be atelectasis or pneumonia, including aspiration. 4. Heterogeneous pulmonary parenchyma, can be seen with small airways disease. 5. Chronic T10 compression fracture. Slight loss of height of T12 superior endplate has progressed from prior exam but is age indeterminate, likely chronic but technically age indeterminate. Aortic Atherosclerosis (ICD10-I70.0). Electronically Signed   By: Keith Rake M.D.   On: 05/06/2020 03:05   DG Swallowing Func-Speech Pathology  Result Date: 05/07/2020 Objective Swallowing Evaluation: Type of Study: Bedside Swallow Evaluation  Patient Details Name: BARTON WANT MRN: [id number] Date of Birth: 11-04-1923 Today's Date: 05/07/2020 Time: SLP Start Time (ACUTE ONLY): 1250 -SLP Stop Time (ACUTE ONLY): 1316 SLP Time Calculation (min) (ACUTE ONLY): 26 min Past Medical History: Past Medical History: Diagnosis Date  Arrhythmia   afib  Arthritis   "back and hands" (09/26/2015)  Basal cell carcinoma   CHF (congestive heart failure) (HCC)   Coronary artery disease   CVI (common variable immunodeficiency) (HCC)   DJD (degenerative joint disease)   ED (erectile dysfunction)   GERD (gastroesophageal reflux disease)   Gout   Heart attack ([place])  1986  Hyperlipidemia   Hypertension   IFG (impaired fasting glucose)   Neuropathy   Non Hodgkin's lymphoma ([place]) dx'd 2006  PAC (premature atrial contraction)   PE (pulmonary embolism) 2007  "when I was taking chemo"  Peptic ulcer disease   Pneumonia ~ 2007 X 2  Squamous carcinoma   Venous stasis ulcers ([place])  Past Surgical History: Past Surgical History: Procedure Laterality Date  BACK SURGERY    CARDIAC CATHETERIZATION  1990  CATARACT EXTRACTION W/ INTRAOCULAR LENS  IMPLANT, BILATERAL Bilateral 10/2006 ~ 11/2006  CORONARY ANGIOPLASTY    CORONARY ARTERY BYPASS GRAFT  1990  CABG X3  ELBOW SURGERY Left   "related to gout"  INSERT /  REPLACE / REMOVE PACEMAKER  08/2010  KNEE ARTHROSCOPY Left   LUMBAR DISC SURGERY    "had pinched nerve; had to make room for it"  [place]  "related to bacteria infection in sinus"  SQUAMOUS CELL CARCINOMA EXCISION Right 09/12/2015  cheek  TONSILLECTOMY  1972 HPI: BANE HAGY is a 86 y.o. male with medical history significant of atrial fibrillation on Coumadin, CAD, CHF, hypertension, hyperlipidemia, previous history of PE who presents to the emergency department complaints of weakness. Patient has interim history cough,and mild sob x 1 week. He was treated for bronchitis by his pcp with steroids and antibiotics. His symptoms however persisted and patient was noted to very weak with inability to get of bed. Due to this patient has had home xray that was concerning for PNA. H/o dysphagia with prior need for thickened liquids.  Subjective: Pt upright in MBSS chair, agreeable to study. Assessment / Plan / Recommendation CHL IP CLINICAL IMPRESSIONS 05/07/2020 Clinical Impression Mr. Tetterton demonstrates a mild oral and moderate pharyngeal dysphagia. Oral phase is c/b: prolonged mastication of solids with complete recollection prior to the swallow; likely d/t dentition; pt i'ly defers regular solid trials. Pharyngeal phase is c/b: reduced strength/ROM of all structures, consistent with presbyphagia. Reduced BOT retraction, epiglottic inversion, hyolaryngeal excursion, laryngeal vestibule closure, resulting in mild-moderate aspiration of thin liquids via cup and severe aspiration of thin liquids via straw. Pt is sensate and exhibits strong cough response, however, is unable to effectively clear aspiration. Deficits also resulted in trace penetration of nectar thick liquids, shallow in depth and typically fully removed from laryngeal vestibule. Findings consistent with nectar thick liquids via cup or via straw. Pt with excellent clearance and no penetration/aspiration given purees and soft solids.  Recommend: nectar thick liquids via cup or straw, Dys 3 solids, diligent oral care with toothbrush/toothpaste TID, meds whole in puree, dysphagia treatment. Findings discussed with patient who demonstrated understanding and agreement. SLP Visit Diagnosis Dysphagia, unspecified (R13.10) Impact on safety and function Severe aspiration risk   CHL IP TREATMENT RECOMMENDATION 05/07/2020 Treatment Recommendations F/U MBS in --- days (Comment);Therapy as outlined in treatment plan below   Prognosis 05/07/2020 Prognosis for Safe Diet Advancement Fair Barriers to Reach Goals Time post onset;Severity of deficits Barriers/Prognosis Comment Advanced Age CHL IP DIET RECOMMENDATION 05/07/2020 SLP Diet Recommendations Nectar thick liquid;Dysphagia 3 (Mech soft) solids Liquid Administration via Cup;Straw Medication Administration Whole meds with puree Compensations Small sips/bites;Follow solids with liquid Postural Changes Seated upright at 90 degrees   CHL IP OTHER RECOMMENDATIONS 05/07/2020 Recommended Consults n/a Oral Care Recommendations Oral care BID;Staff/trained caregiver to provide oral care Other Recommendations Order thickener from pharmacy   CHL IP FOLLOW UP RECOMMENDATIONS 05/07/2020 Follow up Recommendations Home health SLP   CHL IP FREQUENCY AND DURATION 05/07/2020  Speech Therapy Frequency (ACUTE ONLY) min 2x/week Treatment Duration 1 week      CHL IP ORAL PHASE 05/07/2020 Oral Phase WFL  CHL IP PHARYNGEAL PHASE 05/07/2020 Pharyngeal Phase Impaired Pharyngeal- Nectar Cup Delayed swallow initiation-vallecula;Reduced pharyngeal peristalsis;Reduced epiglottic inversion;Reduced laryngeal elevation;Reduced anterior laryngeal mobility;Reduced tongue base retraction;Penetration/Aspiration before swallow Pharyngeal Material enters airway, remains ABOVE vocal cords and not ejected out;Material enters airway, remains ABOVE vocal cords then ejected out Pharyngeal- Nectar Straw Reduced pharyngeal peristalsis;Reduced epiglottic  inversion;Reduced anterior laryngeal mobility;Reduced laryngeal elevation;Reduced airway/laryngeal closure;Reduced tongue base retraction;Penetration/Aspiration before swallow Pharyngeal- Thin Cup Delayed swallow initiation-pyriform sinuses;Reduced pharyngeal peristalsis;Reduced epiglottic inversion;Reduced anterior laryngeal mobility;Reduced laryngeal elevation;Reduced airway/laryngeal closure;Reduced tongue base retraction;Penetration/Aspiration before swallow;Moderate aspiration Pharyngeal Material enters airway, passes BELOW cords and not ejected out despite cough attempt by patient Pharyngeal- Thin Straw Delayed swallow initiation-pyriform sinuses;Reduced pharyngeal peristalsis;Reduced epiglottic inversion;Reduced anterior laryngeal mobility;Reduced laryngeal elevation;Reduced airway/laryngeal closure;Reduced tongue base retraction;Penetration/Aspiration before swallow;Moderate aspiration;Significant aspiration (Amount) Pharyngeal Material enters airway, passes BELOW cords and not ejected out despite cough attempt by patient Pharyngeal- Puree WFL Pharyngeal- Mechanical Soft WFL  CHL IP CERVICAL ESOPHAGEAL PHASE 05/07/2020 Cervical Esophageal Phase [facility] P. Isenhour, M.S., CCC-SLP Speech-Language Pathologist Acute Rehabilitation Services Pager: [place] 05/07/2020, 1:31 PM              CUP PACEART REMOTE DEVICE CHECK  Result Date: 04/10/2020 Scheduled remote reviewed. Normal device function.  Next remote 91 days. [place]    Subjective: No complaints.  Discharge Exam: Vitals:   05/08/20 2318 05/09/20 0938  BP: 125/66 (!) 133/49  Pulse: 64 80  Resp:  18  Temp: 97.7 F (36.5 C) 98.1 F (36.7 C)  SpO2: 98% 98%   Vitals:   05/08/20 1700 05/08/20 2015 05/08/20 2318 05/09/20 0938  BP: 126/65  125/66 (!) 133/49  Pulse: 68  64 80  Resp: 18   18  Temp: 97.8 F (36.6 C)  97.7 F (36.5 C) 98.1 F (36.7 C)  TempSrc: Oral  Oral Oral  SpO2: 97% 98% 98% 98%  Weight:       Height:        General: Pt is alert, awake, not in acute distress Cardiovascular: RRR, S1/S2 +, no rubs, no gallops Respiratory: CTA bilaterally, no wheezing, no rhonchi Abdominal: Soft, NT, ND, bowel sounds + Extremities: no edema, no cyanosis    The results of significant diagnostics from this hospitalization (including imaging, microbiology, ancillary and laboratory) are listed below for reference.     Microbiology: Recent Results (from the past 240 hour(s))  SARS Coronavirus 2 by RT PCR (hospital order, performed in [facility] hospital lab) Nasopharyngeal Nasopharyngeal Swab     Status: None   Collection Time: 05/06/20 12:17 AM   Specimen: Nasopharyngeal Swab  Result Value Ref Range Status   SARS Coronavirus 2 NEGATIVE NEGATIVE Final    Comment: (NOTE) SARS-CoV-2 target nucleic acids are NOT DETECTED.  The SARS-CoV-2 RNA is generally detectable in upper and lower respiratory specimens during the acute phase of infection. The lowest concentration of SARS-CoV-2 viral copies this assay can detect is 250 copies / mL. A negative result does not preclude SARS-CoV-2 infection and should not be used as the sole basis for treatment or other patient management decisions.  A negative result may occur with improper specimen collection / handling, submission of specimen other than nasopharyngeal swab, presence of viral mutation(s) within the areas targeted by this assay, and inadequate number of viral copies (<250 copies / mL). A negative result must  be combined with clinical observations, patient history, and epidemiological information.  Fact Sheet for Patients:   StrictlyIdeas.no  Fact Sheet for Healthcare Providers: BankingDealers.co.za  This test is not yet approved or  cleared by the [place] FDA and has been authorized for detection and/or diagnosis of SARS-CoV-2 by FDA under an Emergency Use Authorization (EUA).  This  EUA will remain in effect (meaning this test can be used) for the duration of the COVID-19 declaration under Section 564(b)(1) of the Act, 21 U.S.C. section 360bbb-3(b)(1), unless the authorization is terminated or revoked sooner.  Performed at [facility], [place] [address] [place], [place] [address]   Urine culture     Status: None   Collection Time: 05/06/20 12:59 AM   Specimen: Urine, Random  Result Value Ref Range Status   Specimen Description URINE, RANDOM  Final   Special Requests NONE  Final   Culture   Final    NO GROWTH Performed at [facility], [place] [address], [place], [place] [address]    Report Status 05/07/2020 FINAL  Final  Blood culture (routine x 2)     Status: None (Preliminary result)   Collection Time: 05/06/20  4:26 AM   Specimen: BLOOD RIGHT HAND  Result Value Ref Range Status   Specimen Description BLOOD RIGHT HAND  Final   Special Requests   Final    BOTTLES DRAWN AEROBIC AND ANAEROBIC Blood Culture adequate volume   Culture   Final    NO GROWTH 3 DAYS Performed at [facility], [place] [address], [place], [place] [address]    Report Status PENDING  Incomplete  Blood culture (routine x 2)     Status: None (Preliminary result)   Collection Time: 05/06/20  4:36 AM   Specimen: BLOOD LEFT FOREARM  Result Value Ref Range Status   Specimen Description BLOOD LEFT FOREARM  Final   Special Requests   Final    BOTTLES DRAWN AEROBIC AND ANAEROBIC Blood Culture adequate volume   Culture   Final    NO GROWTH 3 DAYS Performed at [facility], [place] [address] [place], [place] [address]    Report Status PENDING  Incomplete  Respiratory Panel by PCR     Status: Abnormal   Collection Time: 05/07/20  8:34 AM   Specimen: Nasopharyngeal Swab; Respiratory  Result Value Ref Range Status   Adenovirus NOT DETECTED NOT DETECTED Final   Coronavirus 229E NOT DETECTED NOT DETECTED Final    Comment: (NOTE) The Coronavirus on the Respiratory Panel,  DOES NOT test for the novel  Coronavirus (2019 nCoV)    Coronavirus HKU1 NOT DETECTED NOT DETECTED Final   Coronavirus NL63 NOT DETECTED NOT DETECTED Final   Coronavirus OC43 NOT DETECTED NOT DETECTED Final   Metapneumovirus NOT DETECTED NOT DETECTED Final   Rhinovirus / Enterovirus DETECTED (A) NOT DETECTED Final   Influenza A NOT DETECTED NOT DETECTED Final   Influenza B NOT DETECTED NOT DETECTED Final   Parainfluenza Virus 1 NOT DETECTED NOT DETECTED Final   Parainfluenza Virus 2 NOT DETECTED NOT DETECTED Final   Parainfluenza Virus 3 NOT DETECTED NOT DETECTED Final   Parainfluenza Virus 4 NOT DETECTED NOT DETECTED Final   Respiratory Syncytial Virus NOT DETECTED NOT DETECTED Final   Bordetella pertussis NOT DETECTED NOT DETECTED Final   Chlamydophila pneumoniae NOT DETECTED NOT DETECTED Final   Mycoplasma pneumoniae NOT DETECTED NOT DETECTED Final    Comment: Performed at [facility],  [address] [place], [place] [address]     Labs: BNP (last 3 results) No results for input(s): BNP in the last 8760 hours. Basic Metabolic Panel: Recent Labs  Lab 05/05/20 1115 05/05/20 2139 05/06/20 0438  NA 138 138 139  K 3.6 4.3 3.7  CL 98 101 102  CO2 29 25 29   GLUCOSE 69* 113* 106*  BUN 32* 30* 27*  CREATININE 1.15 1.07 1.11  CALCIUM 9.3 9.8 9.3   Liver Function Tests: Recent Labs  Lab 05/06/20 0438  AST 31  ALT 31  ALKPHOS 65  BILITOT 1.0  PROT 6.1*  ALBUMIN 2.8*   No results for input(s): LIPASE, AMYLASE in the last 168 hours. No results for input(s): AMMONIA in the last 168 hours. CBC: Recent Labs  Lab 05/05/20 1115 05/05/20 2139 05/06/20 0438 05/07/20 0311  WBC 11.3* 10.5 10.2 9.9  NEUTROABS 7.5  --   --  6.8  HGB 14.3 15.1 14.0 13.8  HCT 42.3 45.8 43.5 41.7  MCV 101.0* 102.9* 103.6* 102.2*  PLT 198 212 196 204   Cardiac Enzymes: No results for input(s): CKTOTAL, CKMB, CKMBINDEX, TROPONINI in the last 168 hours. BNP: Invalid input(s):  POCBNP CBG: No results for input(s): GLUCAP in the last 168 hours. D-Dimer No results for input(s): DDIMER in the last 72 hours. Hgb A1c No results for input(s): HGBA1C in the last 72 hours. Lipid Profile No results for input(s): CHOL, HDL, LDLCALC, TRIG, CHOLHDL, LDLDIRECT in the last 72 hours. Thyroid function studies No results for input(s): TSH, T4TOTAL, T3FREE, THYROIDAB in the last 72 hours.  Invalid input(s): FREET3 Anemia work up No results for input(s): VITAMINB12, FOLATE, FERRITIN, TIBC, IRON, RETICCTPCT in the last 72 hours. Urinalysis    Component Value Date/Time   COLORURINE STRAW (A) 05/06/2020 0007   APPEARANCEUR CLEAR 05/06/2020 0007   LABSPEC 1.006 05/06/2020 0007   PHURINE 7.0 05/06/2020 0007   GLUCOSEU NEGATIVE 05/06/2020 0007   HGBUR NEGATIVE 05/06/2020 0007   BILIRUBINUR NEGATIVE 05/06/2020 0007   KETONESUR NEGATIVE 05/06/2020 0007   PROTEINUR NEGATIVE 05/06/2020 0007   UROBILINOGEN 0.2 07/08/2015 0428   NITRITE NEGATIVE 05/06/2020 0007   LEUKOCYTESUR NEGATIVE 05/06/2020 0007   Sepsis Labs Invalid input(s): PROCALCITONIN,  WBC,  LACTICIDVEN Microbiology Recent Results (from the past 240 hour(s))  SARS Coronavirus 2 by RT PCR (hospital order, performed in [place] hospital lab) Nasopharyngeal Nasopharyngeal Swab     Status: None   Collection Time: 05/06/20 12:17 AM   Specimen: Nasopharyngeal Swab  Result Value Ref Range Status   SARS Coronavirus 2 NEGATIVE NEGATIVE Final    Comment: (NOTE) SARS-CoV-2 target nucleic acids are NOT DETECTED.  The SARS-CoV-2 RNA is generally detectable in upper and lower respiratory specimens during the acute phase of infection. The lowest concentration of SARS-CoV-2 viral copies this assay can detect is 250 copies / mL. A negative result does not preclude SARS-CoV-2 infection and should not be used as the sole basis for treatment or other patient management decisions.  A negative result may occur with improper  specimen collection / handling, submission of specimen other than nasopharyngeal swab, presence of viral mutation(s) within the areas targeted by this assay, and inadequate number of viral copies (<250 copies / mL). A negative result must be combined with clinical observations, patient history, and epidemiological information.  Fact Sheet for Patients:   StrictlyIdeas.no  Fact Sheet for Healthcare Providers: BankingDealers.co.za  This test is not yet approved or  cleared by the [place] FDA  and has been authorized for detection and/or diagnosis of SARS-CoV-2 by FDA under an Emergency Use Authorization (EUA).  This EUA will remain in effect (meaning this test can be used) for the duration of the COVID-19 declaration under Section 564(b)(1) of the Act, 21 U.S.C. section 360bbb-3(b)(1), unless the authorization is terminated or revoked sooner.  Performed at [facility], [place] [address] [place], [place] [address]   Urine culture     Status: None   Collection Time: 05/06/20 12:59 AM   Specimen: Urine, Random  Result Value Ref Range Status   Specimen Description URINE, RANDOM  Final   Special Requests NONE  Final   Culture   Final    NO GROWTH Performed at [facility], [place] [address] [place], [place] [address]    Report Status 05/07/2020 FINAL  Final  Blood culture (routine x 2)     Status: None (Preliminary result)   Collection Time: 05/06/20  4:26 AM   Specimen: BLOOD RIGHT HAND  Result Value Ref Range Status   Specimen Description BLOOD RIGHT HAND  Final   Special Requests   Final    BOTTLES DRAWN AEROBIC AND ANAEROBIC Blood Culture adequate volume   Culture   Final    NO GROWTH 3 DAYS Performed at [facility], [place] [address], [place], [place] [address]    Report Status PENDING  Incomplete  Blood culture (routine x 2)     Status: None (Preliminary result)   Collection Time: 05/06/20  4:36 AM    Specimen: BLOOD LEFT FOREARM  Result Value Ref Range Status   Specimen Description BLOOD LEFT FOREARM  Final   Special Requests   Final    BOTTLES DRAWN AEROBIC AND ANAEROBIC Blood Culture adequate volume   Culture   Final    NO GROWTH 3 DAYS Performed at [facility], [place] [address] [place], [place] [address]    Report Status PENDING  Incomplete  Respiratory Panel by PCR     Status: Abnormal   Collection Time: 05/07/20  8:34 AM   Specimen: Nasopharyngeal Swab; Respiratory  Result Value Ref Range Status   Adenovirus NOT DETECTED NOT DETECTED Final   Coronavirus 229E NOT DETECTED NOT DETECTED Final    Comment: (NOTE) The Coronavirus on the Respiratory Panel, DOES NOT test for the novel  Coronavirus (2019 nCoV)    Coronavirus HKU1 NOT DETECTED NOT DETECTED Final   Coronavirus NL63 NOT DETECTED NOT DETECTED Final   Coronavirus OC43 NOT DETECTED NOT DETECTED Final   Metapneumovirus NOT DETECTED NOT DETECTED Final   Rhinovirus / Enterovirus DETECTED (A) NOT DETECTED Final   Influenza A NOT DETECTED NOT DETECTED Final   Influenza B NOT DETECTED NOT DETECTED Final   Parainfluenza Virus 1 NOT DETECTED NOT DETECTED Final   Parainfluenza Virus 2 NOT DETECTED NOT DETECTED Final   Parainfluenza Virus 3 NOT DETECTED NOT DETECTED Final   Parainfluenza Virus 4 NOT DETECTED NOT DETECTED Final   Respiratory Syncytial Virus NOT DETECTED NOT DETECTED Final   Bordetella pertussis NOT DETECTED NOT DETECTED Final   Chlamydophila pneumoniae NOT DETECTED NOT DETECTED Final   Mycoplasma pneumoniae NOT DETECTED NOT DETECTED Final    Comment: Performed at [facility], [place]. [address] [place],  [address]     Time coordinating discharge: Over 40 minutes  SIGNED:   Charlynne Cousins, MD  [facility] 05/09/2020, 9:44 AM Pager   If 7PM-7AM, please contact night-coverage [email or web address] Password  [email or web address]

## 2020-05-09 NOTE — Plan of Care (Signed)
Patient is adequate for discharge.  

## 2020-05-11 LAB — CULTURE, BLOOD (ROUTINE X 2)
Culture: NO GROWTH
Culture: NO GROWTH
Special Requests: ADEQUATE
Special Requests: ADEQUATE

## 2020-05-16 DIAGNOSIS — E876 Hypokalemia: Secondary | ICD-10-CM | POA: Diagnosis not present

## 2020-05-16 DIAGNOSIS — I482 Chronic atrial fibrillation, unspecified: Secondary | ICD-10-CM | POA: Diagnosis not present

## 2020-05-16 DIAGNOSIS — I110 Hypertensive heart disease with heart failure: Secondary | ICD-10-CM | POA: Diagnosis not present

## 2020-05-16 DIAGNOSIS — I2510 Atherosclerotic heart disease of native coronary artery without angina pectoris: Secondary | ICD-10-CM | POA: Diagnosis not present

## 2020-05-17 DIAGNOSIS — J690 Pneumonitis due to inhalation of food and vomit: Secondary | ICD-10-CM | POA: Diagnosis not present

## 2020-05-17 DIAGNOSIS — D6869 Other thrombophilia: Secondary | ICD-10-CM | POA: Diagnosis not present

## 2020-05-24 ENCOUNTER — Telehealth: Payer: Self-pay | Admitting: Hospice

## 2020-05-24 NOTE — Telephone Encounter (Signed)
Spoke with patient's son, [name], regarding Palliative services and all questions were answered and he was in agreement with this.  I have scheduled an In-person Consult for 06/05/20 @ 1 PM.

## 2020-05-31 DIAGNOSIS — I890 Lymphedema, not elsewhere classified: Secondary | ICD-10-CM | POA: Diagnosis not present

## 2020-06-04 DIAGNOSIS — I5042 Chronic combined systolic (congestive) and diastolic (congestive) heart failure: Secondary | ICD-10-CM | POA: Diagnosis not present

## 2020-06-05 ENCOUNTER — Other Ambulatory Visit: Payer: Medicare Other | Admitting: Hospice

## 2020-07-10 ENCOUNTER — Ambulatory Visit (INDEPENDENT_AMBULATORY_CARE_PROVIDER_SITE_OTHER): Admitting: *Deleted

## 2020-07-10 DIAGNOSIS — I482 Chronic atrial fibrillation, unspecified: Secondary | ICD-10-CM

## 2020-07-10 DIAGNOSIS — I495 Sick sinus syndrome: Secondary | ICD-10-CM

## 2020-07-10 LAB — CUP PACEART REMOTE DEVICE CHECK
Battery Remaining Longevity: 13 mo
Battery Remaining Percentage: 8 %
Battery Voltage: 2.69 V
Brady Statistic RV Percent Paced: 89 %
Date Time Interrogation Session: 20210901030137
Implantable Lead Implant Date: 20111025
Implantable Lead Implant Date: 20111025
Implantable Lead Location: 753859
Implantable Lead Location: 753860
Implantable Pulse Generator Implant Date: 20111025
Lead Channel Impedance Value: 950 Ohm
Lead Channel Pacing Threshold Amplitude: 0.5 V
Lead Channel Pacing Threshold Pulse Width: 0.4 ms
Lead Channel Sensing Intrinsic Amplitude: 12 mV
Lead Channel Setting Pacing Amplitude: 0.75 V
Lead Channel Setting Pacing Pulse Width: 0.4 ms
Lead Channel Setting Sensing Sensitivity: 2 mV
Pulse Gen Model: 2210
Pulse Gen Serial Number: 7178130

## 2020-07-11 NOTE — Progress Notes (Signed)
Remote pacemaker transmission.   

## 2020-09-09 DEATH — deceased
